# Patient Record
Sex: Female | Born: 1935 | Race: White | Hispanic: No | Smoking: Former smoker
Health system: Southern US, Community
[De-identification: ages and names within clinical notes are randomized; demographics above are authoritative.]

## PROBLEM LIST (undated history)

## (undated) DIAGNOSIS — R300 Dysuria: Secondary | ICD-10-CM

## (undated) DIAGNOSIS — E785 Hyperlipidemia, unspecified: Secondary | ICD-10-CM

## (undated) DIAGNOSIS — J40 Bronchitis, not specified as acute or chronic: Secondary | ICD-10-CM

## (undated) DIAGNOSIS — I8310 Varicose veins of unspecified lower extremity with inflammation: Secondary | ICD-10-CM

## (undated) DIAGNOSIS — K259 Gastric ulcer, unspecified as acute or chronic, without hemorrhage or perforation: Secondary | ICD-10-CM

## (undated) DIAGNOSIS — G4733 Obstructive sleep apnea (adult) (pediatric): Secondary | ICD-10-CM

## (undated) DIAGNOSIS — E039 Hypothyroidism, unspecified: Secondary | ICD-10-CM

## (undated) DIAGNOSIS — M1990 Unspecified osteoarthritis, unspecified site: Secondary | ICD-10-CM

## (undated) DIAGNOSIS — F32A Depression, unspecified: Secondary | ICD-10-CM

## (undated) DIAGNOSIS — H9190 Unspecified hearing loss, unspecified ear: Secondary | ICD-10-CM

## (undated) DIAGNOSIS — M25511 Pain in right shoulder: Secondary | ICD-10-CM

## (undated) DIAGNOSIS — I639 Cerebral infarction, unspecified: Secondary | ICD-10-CM

## (undated) DIAGNOSIS — R0600 Dyspnea, unspecified: Secondary | ICD-10-CM

## (undated) DIAGNOSIS — Z90710 Acquired absence of both cervix and uterus: Secondary | ICD-10-CM

## (undated) DIAGNOSIS — M545 Low back pain, unspecified: Secondary | ICD-10-CM

## (undated) DIAGNOSIS — I8390 Asymptomatic varicose veins of unspecified lower extremity: Secondary | ICD-10-CM

## (undated) DIAGNOSIS — R011 Cardiac murmur, unspecified: Secondary | ICD-10-CM

## (undated) DIAGNOSIS — Z9049 Acquired absence of other specified parts of digestive tract: Secondary | ICD-10-CM

## (undated) DIAGNOSIS — R350 Frequency of micturition: Secondary | ICD-10-CM

## (undated) DIAGNOSIS — Z9289 Personal history of other medical treatment: Secondary | ICD-10-CM

## (undated) DIAGNOSIS — M546 Pain in thoracic spine: Secondary | ICD-10-CM

## (undated) DIAGNOSIS — Z87440 Personal history of urinary (tract) infections: Secondary | ICD-10-CM

## (undated) DIAGNOSIS — M722 Plantar fascial fibromatosis: Secondary | ICD-10-CM

## (undated) DIAGNOSIS — H269 Unspecified cataract: Secondary | ICD-10-CM

## (undated) DIAGNOSIS — M79606 Pain in leg, unspecified: Secondary | ICD-10-CM

## (undated) DIAGNOSIS — R079 Chest pain, unspecified: Secondary | ICD-10-CM

## (undated) DIAGNOSIS — T7840XA Allergy, unspecified, initial encounter: Secondary | ICD-10-CM

## (undated) DIAGNOSIS — R197 Diarrhea, unspecified: Secondary | ICD-10-CM

## (undated) DIAGNOSIS — F329 Major depressive disorder, single episode, unspecified: Secondary | ICD-10-CM

## (undated) DIAGNOSIS — I10 Essential (primary) hypertension: Secondary | ICD-10-CM

## (undated) DIAGNOSIS — R002 Palpitations: Secondary | ICD-10-CM

## (undated) DIAGNOSIS — G459 Transient cerebral ischemic attack, unspecified: Secondary | ICD-10-CM

## (undated) DIAGNOSIS — M25562 Pain in left knee: Secondary | ICD-10-CM

## (undated) DIAGNOSIS — Z90722 Acquired absence of ovaries, bilateral: Secondary | ICD-10-CM

## (undated) DIAGNOSIS — R269 Unspecified abnormalities of gait and mobility: Principal | ICD-10-CM

## (undated) DIAGNOSIS — E7439 Other disorders of intestinal carbohydrate absorption: Secondary | ICD-10-CM

## (undated) DIAGNOSIS — Z9889 Other specified postprocedural states: Secondary | ICD-10-CM

## (undated) DIAGNOSIS — K649 Unspecified hemorrhoids: Secondary | ICD-10-CM

## (undated) DIAGNOSIS — G4730 Sleep apnea, unspecified: Secondary | ICD-10-CM

## (undated) DIAGNOSIS — H8110 Benign paroxysmal vertigo, unspecified ear: Secondary | ICD-10-CM

## (undated) DIAGNOSIS — R001 Bradycardia, unspecified: Secondary | ICD-10-CM

## (undated) DIAGNOSIS — E663 Overweight: Secondary | ICD-10-CM

## (undated) DIAGNOSIS — I350 Nonrheumatic aortic (valve) stenosis: Secondary | ICD-10-CM

## (undated) DIAGNOSIS — Z9989 Dependence on other enabling machines and devices: Secondary | ICD-10-CM

## (undated) DIAGNOSIS — N3020 Other chronic cystitis without hematuria: Secondary | ICD-10-CM

## (undated) DIAGNOSIS — Z9079 Acquired absence of other genital organ(s): Secondary | ICD-10-CM

## (undated) HISTORY — DX: Personal history of other medical treatment: Z92.89

## (undated) HISTORY — DX: Pain in left knee: M25.562

## (undated) HISTORY — DX: Dysuria: R30.0

## (undated) HISTORY — DX: Acquired absence of other specified parts of digestive tract: Z90.49

## (undated) HISTORY — DX: Gastric ulcer, unspecified as acute or chronic, without hemorrhage or perforation: K25.9

## (undated) HISTORY — PX: TONSILLECTOMY: SUR1361

## (undated) HISTORY — DX: Bronchitis, not specified as acute or chronic: J40

## (undated) HISTORY — DX: Nonrheumatic aortic (valve) stenosis: I35.0

## (undated) HISTORY — DX: Pain in thoracic spine: M54.6

## (undated) HISTORY — DX: Cardiac murmur, unspecified: R01.1

## (undated) HISTORY — DX: Bradycardia, unspecified: R00.1

## (undated) HISTORY — DX: Pain in right shoulder: M25.511

## (undated) HISTORY — DX: Depression, unspecified: F32.A

## (undated) HISTORY — DX: Allergy, unspecified, initial encounter: T78.40XA

## (undated) HISTORY — DX: Unspecified abnormalities of gait and mobility: R26.9

## (undated) HISTORY — DX: Dependence on other enabling machines and devices: Z99.89

## (undated) HISTORY — DX: Palpitations: R00.2

## (undated) HISTORY — DX: Plantar fascial fibromatosis: M72.2

## (undated) HISTORY — DX: Diarrhea, unspecified: R19.7

## (undated) HISTORY — DX: Dyspnea, unspecified: R06.00

## (undated) HISTORY — DX: Other chronic cystitis without hematuria: N30.20

## (undated) HISTORY — DX: Asymptomatic varicose veins of unspecified lower extremity: I83.90

## (undated) HISTORY — DX: Acquired absence of both cervix and uterus: Z90.722

## (undated) HISTORY — DX: Benign paroxysmal vertigo, unspecified ear: H81.10

## (undated) HISTORY — DX: Acquired absence of both cervix and uterus: Z90.79

## (undated) HISTORY — DX: Major depressive disorder, single episode, unspecified: F32.9

## (undated) HISTORY — DX: Unspecified osteoarthritis, unspecified site: M19.90

## (undated) HISTORY — DX: Unspecified hemorrhoids: K64.9

## (undated) HISTORY — PX: INCONTINENCE SURGERY: SHX676

## (undated) HISTORY — DX: Chest pain, unspecified: R07.9

## (undated) HISTORY — DX: Acquired absence of both cervix and uterus: Z90.710

## (undated) HISTORY — DX: Personal history of urinary (tract) infections: Z87.440

## (undated) HISTORY — DX: Low back pain, unspecified: M54.50

## (undated) HISTORY — DX: Overweight: E66.3

## (undated) HISTORY — DX: Other disorders of intestinal carbohydrate absorption: E74.39

## (undated) HISTORY — DX: Varicose veins of unspecified lower extremity with inflammation: I83.10

## (undated) HISTORY — DX: Obstructive sleep apnea (adult) (pediatric): G47.33

## (undated) HISTORY — DX: Frequency of micturition: R35.0

## (undated) HISTORY — DX: Unspecified hearing loss, unspecified ear: H91.90

## (undated) HISTORY — DX: Other specified postprocedural states: Z98.890

## (undated) HISTORY — PX: ABDOMINAL HYSTERECTOMY: SHX81

## (undated) HISTORY — DX: Pain in leg, unspecified: M79.606

## (undated) HISTORY — PX: CHOLECYSTECTOMY: SHX55

---
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
## 1998-06-02 ENCOUNTER — Ambulatory Visit (HOSPITAL_COMMUNITY): Admission: RE | Admit: 1998-06-02 | Discharge: 1998-06-02 | Payer: Self-pay | Admitting: Internal Medicine

## 1998-06-18 ENCOUNTER — Other Ambulatory Visit: Admission: RE | Admit: 1998-06-18 | Discharge: 1998-06-18 | Payer: Self-pay | Admitting: Obstetrics and Gynecology

## 1998-09-03 ENCOUNTER — Other Ambulatory Visit: Admission: RE | Admit: 1998-09-03 | Discharge: 1998-09-03 | Payer: Self-pay | Admitting: Podiatry

## 1999-11-25 ENCOUNTER — Other Ambulatory Visit: Admission: RE | Admit: 1999-11-25 | Discharge: 1999-11-25 | Payer: Self-pay | Admitting: Obstetrics and Gynecology

## 2001-03-07 ENCOUNTER — Other Ambulatory Visit: Admission: RE | Admit: 2001-03-07 | Discharge: 2001-03-07 | Payer: Self-pay | Admitting: Obstetrics and Gynecology

## 2002-04-03 ENCOUNTER — Other Ambulatory Visit: Admission: RE | Admit: 2002-04-03 | Discharge: 2002-04-03 | Payer: Self-pay | Admitting: Gynecology

## 2002-10-19 ENCOUNTER — Encounter (INDEPENDENT_AMBULATORY_CARE_PROVIDER_SITE_OTHER): Payer: Self-pay | Admitting: *Deleted

## 2002-10-19 ENCOUNTER — Ambulatory Visit (HOSPITAL_BASED_OUTPATIENT_CLINIC_OR_DEPARTMENT_OTHER): Admission: RE | Admit: 2002-10-19 | Discharge: 2002-10-19 | Payer: Self-pay | Admitting: Urology

## 2003-07-12 ENCOUNTER — Ambulatory Visit (HOSPITAL_COMMUNITY): Admission: RE | Admit: 2003-07-12 | Discharge: 2003-07-12 | Payer: Self-pay | Admitting: Gastroenterology

## 2003-10-08 ENCOUNTER — Inpatient Hospital Stay (HOSPITAL_COMMUNITY): Admission: RE | Admit: 2003-10-08 | Discharge: 2003-10-10 | Payer: Self-pay | Admitting: Gynecology

## 2004-07-10 ENCOUNTER — Inpatient Hospital Stay (HOSPITAL_COMMUNITY): Admission: EM | Admit: 2004-07-10 | Discharge: 2004-07-11 | Payer: Self-pay | Admitting: Emergency Medicine

## 2004-07-11 ENCOUNTER — Encounter: Payer: Self-pay | Admitting: *Deleted

## 2004-07-20 ENCOUNTER — Other Ambulatory Visit: Admission: RE | Admit: 2004-07-20 | Discharge: 2004-07-20 | Payer: Self-pay | Admitting: Gynecology

## 2004-12-31 ENCOUNTER — Ambulatory Visit: Payer: Self-pay | Admitting: Licensed Clinical Social Worker

## 2005-01-15 ENCOUNTER — Ambulatory Visit: Payer: Self-pay | Admitting: Licensed Clinical Social Worker

## 2005-02-26 ENCOUNTER — Ambulatory Visit: Payer: Self-pay | Admitting: Licensed Clinical Social Worker

## 2005-05-21 ENCOUNTER — Ambulatory Visit: Payer: Self-pay | Admitting: Licensed Clinical Social Worker

## 2005-05-27 ENCOUNTER — Ambulatory Visit: Payer: Self-pay | Admitting: Licensed Clinical Social Worker

## 2005-06-02 ENCOUNTER — Ambulatory Visit: Payer: Self-pay | Admitting: Licensed Clinical Social Worker

## 2005-06-11 ENCOUNTER — Ambulatory Visit: Payer: Self-pay | Admitting: Licensed Clinical Social Worker

## 2005-06-15 ENCOUNTER — Ambulatory Visit: Payer: Self-pay | Admitting: Licensed Clinical Social Worker

## 2005-06-18 ENCOUNTER — Ambulatory Visit: Payer: Self-pay | Admitting: Licensed Clinical Social Worker

## 2005-06-23 ENCOUNTER — Ambulatory Visit: Payer: Self-pay | Admitting: Licensed Clinical Social Worker

## 2005-06-25 ENCOUNTER — Ambulatory Visit: Payer: Self-pay | Admitting: Licensed Clinical Social Worker

## 2005-08-03 ENCOUNTER — Ambulatory Visit: Payer: Self-pay | Admitting: Licensed Clinical Social Worker

## 2005-10-13 ENCOUNTER — Ambulatory Visit: Payer: Self-pay | Admitting: Licensed Clinical Social Worker

## 2005-11-24 ENCOUNTER — Ambulatory Visit: Payer: Self-pay | Admitting: Licensed Clinical Social Worker

## 2006-08-31 ENCOUNTER — Ambulatory Visit: Payer: Self-pay | Admitting: Licensed Clinical Social Worker

## 2006-09-12 ENCOUNTER — Other Ambulatory Visit: Admission: RE | Admit: 2006-09-12 | Discharge: 2006-09-12 | Payer: Self-pay | Admitting: Gynecology

## 2006-09-14 ENCOUNTER — Ambulatory Visit: Payer: Self-pay | Admitting: Licensed Clinical Social Worker

## 2007-10-18 ENCOUNTER — Encounter: Admission: RE | Admit: 2007-10-18 | Discharge: 2007-10-18 | Payer: Self-pay | Admitting: Gynecology

## 2007-10-30 ENCOUNTER — Ambulatory Visit: Payer: Self-pay | Admitting: Licensed Clinical Social Worker

## 2008-10-30 ENCOUNTER — Encounter: Admission: RE | Admit: 2008-10-30 | Discharge: 2008-10-30 | Payer: Self-pay | Admitting: Internal Medicine

## 2009-03-06 ENCOUNTER — Encounter: Admission: RE | Admit: 2009-03-06 | Discharge: 2009-03-06 | Payer: Self-pay | Admitting: Internal Medicine

## 2009-11-17 ENCOUNTER — Encounter: Admission: RE | Admit: 2009-11-17 | Discharge: 2009-11-17 | Payer: Self-pay | Admitting: Internal Medicine

## 2010-07-23 ENCOUNTER — Ambulatory Visit: Payer: Self-pay | Admitting: Cardiology

## 2010-08-14 ENCOUNTER — Ambulatory Visit (INDEPENDENT_AMBULATORY_CARE_PROVIDER_SITE_OTHER): Payer: Medicare Other | Admitting: Cardiology

## 2010-08-14 ENCOUNTER — Encounter: Payer: Self-pay | Admitting: Cardiology

## 2010-08-14 DIAGNOSIS — R079 Chest pain, unspecified: Secondary | ICD-10-CM

## 2010-08-14 DIAGNOSIS — R011 Cardiac murmur, unspecified: Secondary | ICD-10-CM | POA: Insufficient documentation

## 2010-08-18 ENCOUNTER — Telehealth (INDEPENDENT_AMBULATORY_CARE_PROVIDER_SITE_OTHER): Payer: Self-pay | Admitting: *Deleted

## 2010-08-19 ENCOUNTER — Ambulatory Visit (HOSPITAL_COMMUNITY): Payer: Medicare Other | Attending: Cardiology

## 2010-08-19 ENCOUNTER — Ambulatory Visit (HOSPITAL_BASED_OUTPATIENT_CLINIC_OR_DEPARTMENT_OTHER): Payer: Medicare Other

## 2010-08-19 ENCOUNTER — Encounter: Payer: Self-pay | Admitting: Cardiology

## 2010-08-19 DIAGNOSIS — R011 Cardiac murmur, unspecified: Secondary | ICD-10-CM

## 2010-08-19 DIAGNOSIS — G4730 Sleep apnea, unspecified: Secondary | ICD-10-CM | POA: Insufficient documentation

## 2010-08-19 DIAGNOSIS — E785 Hyperlipidemia, unspecified: Secondary | ICD-10-CM | POA: Insufficient documentation

## 2010-08-19 DIAGNOSIS — R072 Precordial pain: Secondary | ICD-10-CM

## 2010-08-19 DIAGNOSIS — I10 Essential (primary) hypertension: Secondary | ICD-10-CM | POA: Insufficient documentation

## 2010-08-20 ENCOUNTER — Telehealth: Payer: Self-pay | Admitting: Cardiology

## 2010-08-25 NOTE — Progress Notes (Signed)
Summary: pt returning your call  Phone Note Call from Patient Call back at Home Phone [phone]   Caller: Patient Reason for Call: Talk to Nurse, Lab or Test Results Summary of Call: returning your call Initial call taken by: Roe Coombs,  August 20, 2010 1:15 PM  Follow-up for Phone Call        I talked with pt about recent echo and stress echo  results

## 2010-08-25 NOTE — Assessment & Plan Note (Signed)
Summary: np6. chest pain. pt has medicare. bcbs.gd, per [place] office ...   Visit Type:  Initial Consult Primary Provider:  Dr. Jacky Kindle   History of Present Illness: 77 yo with history of HTN and hyperlipidemia presents for evaluation of chest pain.  Last month, patient had about 3 episodes of central chest heaviness.  These episodes lasted for several hours at a time and had no particular trigger.  They were not related to meals and not associated with exertion in any way.  The discomfort was mild to moderate.  She swims and does yoga on most days with no exertional dyspnea or chest discomfort.  She has HTN that has been under control with her current medical regimen.  She quit smoking in the 1980s.  She has a history of hyperlipidemia and was on a statin in the past but stopped it due to side effects.  She is now on red yeast rice extract.    Patient had a stress test with Dr. Reyes Ivan about 5 years ago that per her report was normal.   ECG: NSR, rate 51, somewhat peaked T waves  Current Medications (verified): 1)  Lexapro 10 Mg Tabs (Escitalopram Oxalate) .... Once Daily 2)  Lozol 2.5mg  .... Once Daily 3)  Urex 1 Gm Tabs (Methenamine Hippurate) .... Two Times A Day 4)  Plendil 5mg  .... Take One Tablet By Mouth Daily 5)  Synthroid 50 Mcg Tabs (Levothyroxine Sodium) .... Once Daily 6)  Red Yeast Rice   Powd (Red Yeast Rice Extract) .... Two Times A Day 7)  Fish Oil   Oil (Fish Oil) .... 4 Capsules Daily 8)  Cinnamon Oil  Oil (Cassia Oil) .... Two Times A Day 9)  Vitamin D 1000 Unit Tabs (Cholecalciferol) .... Once Daily 10)  Aspirin 81 Mg Tbec (Aspirin) .... Take One Tablet By Mouth Daily 11)  B Complex Vitamins  Caps (B Complex Vitamins) .... Once Daily 12)  Biotin 1000 Mcg Tabs (Biotin) .... Once Daily  Allergies: 1)  ! Cipro 2)  ! * Ephedrine 3)  ! Macrobid 4)  ! Naprosyn 5)  ! Sulfa 6)  ! Lodine  Past History:  Past Medical History: 1. Hypertension 2. Hyperlipidemia 3.  Hypothyroidism 4. Depression 5. chronic cystitis 6. Plantar fasciitis 7. h/o cholecystectomy 8. TAH/BSO  Family History: Mother with MI in her 97s, uncle with MI in his 36s, another uncle with MI in his 79s  Social History: The patient is a retired Engineer, site She lives with her husband.  2 children.  She neither smokes nor drinks (quit tobacco about 1980).   Vital Signs:  Patient profile:   77 year old female Height:      65 inches Weight:      179 pounds BMI:     29.89 Pulse rate:   51 / minute BP sitting:   142 / 66  (left arm)  Vitals Entered By: Laurance Flatten CMA (August 14, 2010 10:04 AM)  Physical Exam  General:  Well developed, well nourished, in no acute distress. Head:  normocephalic and atraumatic Nose:  no deformity, discharge, inflammation, or lesions Mouth:  Teeth, gums and palate normal. Oral mucosa normal. Neck:  Neck supple, no JVD. No masses, thyromegaly or abnormal cervical nodes. Lungs:  Clear bilaterally to auscultation and percussion. Heart:  Non-displaced PMI, chest non-tender; regular rate and rhythm, S1, S2 without rubs or gallops. 2/6 early systolic murmur RUSB.  Carotid upstroke normal, no bruit. Pedals normal pulses. Trace ankle edema.  Abdomen:  Bowel sounds positive; abdomen soft and non-tender without masses, organomegaly, or hernias noted. No hepatosplenomegaly. Extremities:  No clubbing or cyanosis. Neurologic:  Alert and oriented x 3. Skin:  Intact without lesions or rashes. Psych:  Normal affect.   Impression & Recommendations:  Problem # 1:  CHEST PAIN (ICD-786.50) Atypical chest pain in a woman with some cardiac risk factors (HTN, hyperlipidemia).  I will plan on getting a stress echo to risk stratify.  I will check her lipids and have her start ASA 81 mg daily.   Problem # 2:  MURMUR (ICD-785.2) Right upper sternal border systolic murmur suggests aortic sclerosis versus mild aortic stenosis.  Will evaluate with echo.   Other  Orders: Echocardiogram (Echo) Stress Echo (Stress Echo)  Patient Instructions: 1)  Your physician has recommended you make the following change in your medication:  2)  Take Aspirin 81mg  daily--this should be enteric coated. 3)  Your physician has requested that you have a stress echocardiogram. For further information please visit [email or web address]  Please follow instruction sheet as given. 4)  Your physician has requested that you have an echocardiogram.   5)  Echocardiography is a painless test that uses sound waves to create images of your heart. It provides your doctor with information about the size and shape of your heart and how well your heart's chambers and valves are working.  This procedure takes approximately one hour. There are no restrictions for this procedure. 6)  Your physician recommends that you return for a FASTING lipid profile when you return for testing. 7)    8)  Your physician recommends that you schedule a follow-up appointment as needed with Dr Shirlee Latch.

## 2010-08-25 NOTE — Progress Notes (Signed)
Summary: stress echo  Phone Note Outgoing Call Call back at [facility] Phone [phone]   Call placed by: Stanton Kidney, EMT-P,  August 18, 2010 3:20 PM Action Taken: Phone Call Completed Summary of Call: Left message ref: stress echo. Stanton Kidney, EMT-P  August 18, 2010 3:21 PM

## 2010-08-26 ENCOUNTER — Other Ambulatory Visit: Payer: Self-pay | Admitting: Cardiology

## 2010-08-26 ENCOUNTER — Other Ambulatory Visit (INDEPENDENT_AMBULATORY_CARE_PROVIDER_SITE_OTHER): Payer: Medicare Other

## 2010-08-26 ENCOUNTER — Encounter: Payer: Self-pay | Admitting: Cardiology

## 2010-08-26 DIAGNOSIS — R079 Chest pain, unspecified: Secondary | ICD-10-CM

## 2010-08-26 DIAGNOSIS — E785 Hyperlipidemia, unspecified: Secondary | ICD-10-CM

## 2010-08-26 LAB — LIPID PANEL
Cholesterol: 215 mg/dL — ABNORMAL HIGH (ref 0–200)
HDL: 43.8 mg/dL (ref >39.00)
Total CHOL/HDL Ratio: 5
Triglycerides: 189 mg/dL — ABNORMAL HIGH (ref 0.0–149.0)
VLDL: 37.8 mg/dL (ref 0.0–40.0)

## 2010-08-26 LAB — LDL CHOLESTEROL, DIRECT: Direct LDL: 135.1 mg/dL

## 2010-09-01 ENCOUNTER — Telehealth: Payer: Self-pay | Admitting: Cardiology

## 2010-09-01 NOTE — Telephone Encounter (Signed)
Pt given recent lipid results

## 2010-10-19 ENCOUNTER — Other Ambulatory Visit: Payer: Self-pay | Admitting: Internal Medicine

## 2010-10-19 DIAGNOSIS — Z1231 Encounter for screening mammogram for malignant neoplasm of breast: Secondary | ICD-10-CM

## 2010-10-30 NOTE — Op Note (Signed)
NAME:  Autumn Allen, Autumn Allen                      ACCOUNT NO.:  [id number]   MEDICAL RECORD NO.:  [id number]                   PATIENT TYPE:  AMB   LOCATION:  NESC                                 FACILITY:  [facility]   PHYSICIAN:  Jamison Neighbor, M.D.               DATE OF BIRTH:  1929/12/16   DATE OF PROCEDURE:  10/19/2002  DATE OF DISCHARGE:                                 OPERATIVE REPORT   PREOPERATIVE DIAGNOSIS:  Chronic pelvic pain, rule out interstitial  cystitis.   POSTOPERATIVE DIAGNOSIS:  Chronic pelvic pain, rule out interstitial  cystitis.   PROCEDURE:  1. Cystoscopy.  2. Urethral calibration.  3. Hydrodistention of bladder.  4. Bladder biopsy.  5. Marcaine and Pyridium instillation.  6. Marcaine and Kenalog bilateral pudendal nerve block.   SURGEON:  Jamison Neighbor, M.D.   ANESTHESIA:  General.   COMPLICATIONS:  None.   DRAINS:  None.   BRIEF HISTORY:  This 77 year old female initially presented with probably  urinary tract symptoms, thought to be due to a chronic urethritis.  She was  treated with antibiotics and the patient initially did well.  Even after  normalization of her urine, the patient still had lower urinary tract  symptoms.  She has continued on multiple antibiotic therapy, but because she  is still having persistent problems, she has requested evaluation for  interstitial cystitis.  The patient has used some estrogen cream, which has  been helpful.  But, she still wishes to undergo cystoscopic examination.  She is convinced that there is something wrong with her bladder.  The  patient understands the risks and benefits of the procedure, and gave full  and informed consent.   DESCRIPTION OF PROCEDURE:  After the successful induction of general  anesthesia, the patient was placed in the dorsal lithotomy position; prepped  with Betadine and draped in the usual sterile fashion.  On pelvic  examination the patient was noted to have some degree of  atrophic vaginitis  with mucosal thinning, and was felt that she would certainly respond nicely  to estrogen cream.  The patient has a moderate cystocele, no urethrocele, no  rectocele; but, on posterior palpation is noted to have extensive  inspissated stool sitting in the vault.  Vaginal pressure is used to expel  all of the this so that a B&O suppository could be inserted.  This was  inserted,the area was reprepped and gloves were changed.  The urethra was  dilated at 32-French; ___________, but no interstitial stenosis or  stricture.   The cystoscope was inserted.  The bladder was inspected with both 12-degree  and 7-degree lenses.  No tumors or stones could be seen.  There was little  in the way of squamous metaplasia.  The ureteral orifices were normal.  The  bladder mucosa was unremarkable in its appearance.  The bladder was  distended at a pressure of 170 mm water for five minutes.  When the bladder  was drained, the patient was noted to have fairly marked glomerulations  throughout the bladder, with a significant terminal blood tinge.  This would  certainly be consistent with interstitial cystitis, but the patient did have  a normal bladder capacity of approximately 1300 cc under anesthesia.   The patient's bladder was drained,  A biopsy was performed.  The biopsy site  was cauterized.  This biopsy was sent for mast cell analysis.  The bladder  was drained and a mixture of Marcaine and Pyridium was left in the bladder.  Marcaine and Kenalog were injected bilaterally as pudendal nerve blocks.  The patient received intraoperative Toradol and Zofran, as well as  preoperative antibiotics.  The patient also received the intraoperative B&O  suppository.   DISPOSITION:  She will be sent home with a prescription for Lorcet-Plus,  Pyridium-Plus; as well as a short course of antibiotics.  She will be  encouraged to continue with estrogen cream on a daily basis.   FOLLOW UP:  She will  return to my office in follow up in three weeks time.  The patient and I will review the biopsy report at that time.  Certainly we  can consider interstitial cystitis-directed therapy, if the patient had a  response to the hydrodistention and/or the biopsy shows significant mast  cells.  If, on the other hand, the biopsy is unremarkable and the patient  did not respond to hydrodistention, we will have to consider simple  symptomatic management.  This could also include the estrogen cream and the  management of her lower urinary tract symptoms.                                               Jamison Neighbor, M.D.    RJE/MEDQ  D:  10/19/2002  T:  10/19/2002  Job:  [id number]   cc:   Gretta Cool, M.D.  [address] [place]  [place] [address]  Fax: [phone]   Geoffry Paradise, M.D.  [address]  [place]  [place] [address]  Fax: [phone]

## 2010-10-30 NOTE — Discharge Summary (Signed)
NAME:  Autumn Allen, Autumn Allen NO.:  [id number]   MEDICAL RECORD NO.:  [id number]                   PATIENT TYPE:  INP   LOCATION:  0450                                 FACILITY:  [facility]   PHYSICIAN:  Gretta Cool, M.D.              DATE OF BIRTH:  02-24-1930   DATE OF ADMISSION:  10/08/2003  DATE OF DISCHARGE:  10/10/2003                                 DISCHARGE SUMMARY   HISTORY OF PRESENT ILLNESS:  Ms. Hickey is a 77 year old female gravida  5, para 3, AB 2 with a history of hysterectomy since 1983.  Over the last  few years she has had worsening pelvic organ prolapse now grade 4 cystocele,  grade 3 rectocele and enterocele, with vaginal vault descensus.  She has had  testing, barrier test, with pessary placement which increases her leakage  greatly.  She has been followed by Dr. Eudelia Bunch for incontinence.  There is  some urge component but the incontinence is dramatically increased with  placement of the bladder to the usual anatomic location.  She is now  admitted for definitive therapy of the global pelvic organ prolapse by  cystocele, rectocele and enterocele repairs by Dr. Nicholas Lose and by pubovaginal  sling procedure by Dr. Logan Bores.   ADMISSION EXAMINATION:  CHEST:  Chest clear to A&P.  HEART:  Heart rate and rhythm are regular without murmur, gallop or cardiac  enlargement.  ABDOMEN:  Soft and scaphoid without masses or organomegaly.  PELVIC:  External genitalia within normal limits for female, widening of the  genital hiatus.  There is a grade 4 cystocele, a grade 3 rectocele and an  enterocele.  She has vaginal vault descensus post hysterectomy.  There is an  enormous central fascial defect with extension all the way to the apex of  the vaginal cuff.  She has detachment of the posterior perirectal fascia  from the apex to the cuff with a large bulge of enterocele and rectocele.  The apex of the vaginal cuff extends significantly.  Rectovaginal  exam  confirms.   IMPRESSION:  1. Global pelvic organ prolapse with cystocele, rectocele, enterocele grade     3-4 and vaginal vault prolapse.  2. Mixed incontinence.  3. Cystocele, rectocele and enterocele repairs with colposuspension by Dr.     Nicholas Lose and Dr. Eudelia Bunch to do the pubovaginal sling portion of the     procedure.  Risks and benefits were discussed with the patient and she     accepts these procedures.   LABORATORY DATA:  Admission hemoglobin 15.8, hematocrit 46.  On the first  postoperative day hemoglobin was 13.3, hematocrit 38.4, the remainder of her  preoperative lab work was within normal limits.  EKG showed sinus  bradycardia otherwise normal.  Chest x-ray no active disease.   HOSPITAL COURSE:  Patient underwent cystocele, rectocele, enterocele  repairs, cardinal uterosacral suspension by Dr. Nicholas Lose and pubovaginal sling  by Dr.  Eudelia Bunch under general anesthesia.  The procedures were completed  without any complications and the patient was returned to the recovery room  in excellent condition.  Her postoperative course was without complications  and she was discharged on the second postoperative day in excellent  condition.  On discharge she was voiding adequate volumes with some urgency.  It appears that she was discharged with the suprapubic catheter intact and  was to continue voiding trials.  She is to report any fever of over 100.5 or  failure of daily improvement, diet regular, medications Tylox one p.o. q.4h.  p.r.n. discomfort, stool softener as needed, she is also to avoid any  vaginal entrance or heavy lifting or straining.   CONDITION ON DISCHARGE:  Excellent.   FINAL DISCHARGE DIAGNOSES:  1. 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  2. Urinary incontinence.   PROCEDURES PERFORMED:  1. Cystocele, rectocele and enterocele repairs.  2. Cardinal uterosacral colposuspension by Dr.  Nicholas Lose.  3. Pubovaginal sling by Dr. Marcelyn Bruins.     Matt Holmes, N.P.                          Gretta Cool, M.D.    EMK/MEDQ  D:  11/18/2003  T:  11/18/2003  Job:  [id number]   cc:   Geoffry Paradise, M.D.  [address]  [place]  [place] [address]  Fax: [phone]   Jamison Neighbor, M.D.  [address]  [place]  [place] [address]  Fax: [phone]

## 2010-10-30 NOTE — Discharge Summary (Signed)
Autumn Allen, FILION NO.:  [id number]   MEDICAL RECORD NO.:  [id number]          PATIENT TYPE:  INP   LOCATION:  0370                         FACILITY:  [facility]   PHYSICIAN:  Elmore Guise., M.D.DATE OF BIRTH:  [birth year]   DATE OF ADMISSION:  07/09/2004  DATE OF DISCHARGE:  07/11/2004                                 DISCHARGE SUMMARY   DISCHARGE DIAGNOSES:  1.  Hypertension.  2.  History of hypothyroidism.  3.  Chest pain most likely from gastrointestinal etiology.   HISTORY OF PRESENT ILLNESS:  Patient is a very pleasant, 77 year old, white  female who presented to [facility] complaining of one-week history of  anterior chest pain.  Patient noted she recently started a new dietary  supplement, was having increased gas, however, some exertional component to  her chest pain.  She was admitted for observation and cardiac evaluation.   HOSPITAL COURSE:  The patient's hospital course was uncomplicated.  She had  serial cardiac enzymes performed which were negative.  She underwent stress  Cardiolite on 07/11/04 exercising via Bruce protocol for 6 minutes, achieving  peak heart rate of 141 beats per minute, corresponding to 95% of age-  predicted maximum.  Patient had no significant ECG changes and stress test  was negative per ECG criteria.  Nuclear images showed normal perfusion at  stress and rest.  She had no t.i.d. or high risk markers.  She had an EF of  73% with no significant wall motion abnormalities.  Patient was started on  Protonix during her hospitalization with no significant return in her chest  pain.  On day of admission, patient was given sublingual nitroglycerin with  profound drop in her blood pressure to 80/40 which responded to IV fluid  without any significant difficulty.  She will be discharged home to continue  her Protonix and follow up with Dr. Geoffry Paradise in 4 weeks.  She will  follow up with cardiology as needed.   DISCHARGE  MEDICATIONS:  1.  Plendil 5 mg daily (as before).  2.  Indapamide 2.5 mg daily (as before).  3.  Synthroid 50 mcg daily (as before).  4.  Aspirin 81 mg daily (as before).  5.  New medication is Protonix 40 mg p.o. daily.   PAIN MANAGEMENT:  None.   ACTIVITY:  As tolerated.   DIET:  Low cholesterol, low salt diet.   FOLLOWUP:  Dr. Jacky Kindle at [facility] in one month.  Otherwise, she will  follow up with Dr. Reyes Ivan on a p.r.n. basis.   DISCHARGE INSTRUCTIONS:  I did ask her to notify me should she have any  further problems with increase in exertional chest pain and telephone number  for the office was given.      TWK/MEDQ  D:  07/11/2004  T:  07/11/2004  Job:  [id number]   cc:   Geoffry Paradise, M.D.  [address]  [place]  [place] [address]  Fax: [phone]

## 2010-10-30 NOTE — Op Note (Signed)
NAME:  Autumn Allen, Autumn Allen                      ACCOUNT NO.:  [id number]   MEDICAL RECORD NO.:  [id number]                   PATIENT TYPE:  INP   LOCATION:  0450                                 FACILITY:  [facility]   PHYSICIAN:  Gretta Cool, M.D.              DATE OF BIRTH:  1929-06-16   DATE OF PROCEDURE:  DATE OF DISCHARGE:                                 OPERATIVE REPORT   PREOPERATIVE DIAGNOSES:  1. 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  2. Urinary incontinence.   POSTOPERATIVE DIAGNOSES:  1. 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  2. Urinary incontinence.   SURGEON:  Gretta Cool, M.D.   ASSISTANTS:  Phyllis Ginger, M.D. for gyn portion of the procedure.  Brennan Bailey, M.D. for the urology portion of the procedure.   PROCEDURES:  1. Cystocele.  2. Rectocele.  3. Enterocele repairs.  4. Cardinal uterosacral colposuspension by Dr. Nicholas Lose.  5. Pubovaginal sling by Dr. Brennan Bailey.   DESCRIPTION OF PROCEDURE:  Under excellent general anesthesia with the  patient prepped and draped in lithotomy position, the vaginal apex was  grasped was Allis clamps and a transverse incision made.  The mucosa was  then incised and undermined until the base of the urethra was reached.  At  this point, the mucosa was reflected from the endopelvic fascia so as to  expose the extent of the central fascial defect approximately 4 cm across.  An enormous hernia of bladder wall was encountered and the fascia plicated  with first a pursestring suture of 2-0 Vicryl.  Then, a series of vertical  mattress sutures were placed so as to plicate the fascia in the midline and  to completely obliterate the enormous fascial defect.  The repair was  carried all the way to the base of the bladder, and the bladder pillars were  plicated also to each other in the  midline.  The base of the bladder and the  bladder pillars were then secured to the vaginal cuff with interrupted  sutures of 0 Vicryl.  Next, the mucosa was trimmed and the upper layers of  endopelvic fascia and mucosa closed as a subcuticular closure by a series of  interrupted vertical mattress sutures.  Once the entire anterior vaginal  wall had been repaired, attention was turned to the posterior vaginal wall  repair.  The mucosa was incised at the introitus and the incision carried to  the apex of the vaginal cuff.  The mucosa was dissected from the perirectal  fascia.  The incision was carried to the apex of the vaginal cuff.  An  enormous detachment was noted with the fascia detached from the apex of the  cuff and totally deficient and devoid of fascia from the apex of the cuff to  halfway down the vagina.  The cardinal uterosacral ligaments were then  secured and sutures placed with 0 Novofil from the uterosacral cardinal  ligaments to the posterior perirectal fascia.  The fascia was then advanced  all the way up to the uterosacral ligament cardinal complex.  Next, a series  of permanent sutures of 0 Novofil were used to secure the fascia to the apex  of the vaginal cuff and to the uterosacral cardinal complex.  At this point,  once the entire fascial defect had been repaired, the levator fascia was  plicated in the midline with a running suture of 0 Vicryl from the apex of  the vagina to the introitus.  At this point, the procedure was interrupted  for Dr. Brennan Bailey pubovaginal sling procedure.  At the completion of this  procedure, the mucosa was trimmed and the mucosa closed from the apex of the  vaginal cuff including the upper layers of endopelvic fascia so as to  completely plicate the perirectal fascia.  Once the upper portion had been  closed, the perineal body muscles were plicated in the midline, the  bulbocavernosus pulled down and attached and the mucosa closed with  a  subcuticular closure of 2-0 Vicryl.  At this point, the [facility]  was placed by Dr. Logan Bores and secured by Dr. Nicholas Lose.  At this point, the  procedure was terminated without complications and the patient returned to  the recovery room in excellent condition.                                               Gretta Cool, M.D.    CWL/MEDQ  D:  10/08/2003  T:  10/08/2003  Job:  [id number]   cc:   Brennan Bailey, MD   Gretta Cool, M.D.  [address] [place]  [place] [address]  Fax: [phone]   Geoffry Paradise, M.D.  [address]  [place]  [place] [address]  Fax: [phone]

## 2010-10-30 NOTE — Op Note (Signed)
NAME:  Autumn Allen, Autumn Allen                      ACCOUNT NO.:  [id number]   MEDICAL RECORD NO.:  [id number]                   PATIENT TYPE:  AMB   LOCATION:  ENDO                                 [facility]ACILITY[facility]  [facility]   PHYSICIAN:  John C. Madilyn Fireman, M.D.                 DATE OF BIRTH:  Nov 05, 1929   DATE OF PROCEDURE:  07/12/2003  DATE OF DISCHARGE:                                 OPERATIVE REPORT   PROCEDURE:  Colonoscopy.   INDICATION FOR PROCEDURE:  Colon cancer screening in a 77 year old patient  with no previous screening.   DESCRIPTION OF PROCEDURE:  The patient was placed in the left lateral  decubitus position and placed on the pulse monitor with continuous low-flow  oxygen delivered by nasal cannula.  She was sedated with 65 mcg IV fentanyl  and 7 mg IV Versed.  The Olympus video colonoscope was inserted into the  rectum and advanced to the cecum, confirmed by transillumination at  McBurney's point and visualization of the ileocecal valve and appendiceal  orifice.  The prep was good.  The cecum, ascending, transverse, descending,  and sigmoid colon all appeared normal with no masses, polyps, diverticula,  or other mucosal abnormalities.  The rectum likewise appeared normal, and  retroflexed view of the anus revealed no obvious internal hemorrhoids.  The  scope was then withdrawn and the patient returned to the recovery room in  stable condition.  She tolerated the procedure well, and there were no  immediate complications.   IMPRESSION:  Normal screening colonoscopy.   PLAN:  Next colon screening by sigmoidoscopy in five years.                                               John C. Madilyn Fireman, M.D.    JCH/MEDQ  D:  07/12/2003  T:  07/12/2003  Job:  [id number]   cc:   Geoffry Paradise, M.D.  [address]  [place]  [place] [address]  Fax: [phone]

## 2010-10-30 NOTE — Op Note (Signed)
NAME:  Autumn Allen, Autumn Allen                      ACCOUNT NO.:  [id number]   MEDICAL RECORD NO.:  [id number]                   PATIENT TYPE:  INP   LOCATION:  0001                                 FACILITY:  [facility]   PHYSICIAN:  Jamison Neighbor, M.D.               DATE OF BIRTH:  09/28/1929   DATE OF PROCEDURE:  10/08/2003  DATE OF DISCHARGE:                                 OPERATIVE REPORT   PREOPERATIVE DIAGNOSIS:  Stress urinary incontinence.   POSTOPERATIVE DIAGNOSIS:  Stress urinary incontinence.   PROCEDURE:  Cystoscopy, cysto cath, and transobturator tape sling.   SURGEON:  Jamison Neighbor, M.D.   ANESTHESIA:  General.   COMPLICATIONS:  None.   DRAINS:  The Bonnano cysto cath.   BRIEF HISTORY:  This 77 year old female has had symptomatic general  prolapse.  She is scheduled to undergo surgical repair by Dr. Beather Arbour  because there was the concern about the possibility of stress incontinence.  The patient will undergo placement of a sling at the same time she undergoes  her cystocele, rectocele, and enterocele repair.  The patient understands  the risks and benefits of the procedure, including the possibility that she  may need to have this sling loosened or tightened, depending on her  postoperative course.  She gave full informed consent.   PROCEDURE:  After the successful induction of general anesthesia, the  patient was placed in the dorsal lithotomy position, prepped with Betadine,  and draped in the usual sterile fashion.  An excellent cystocele, rectocele,  and enterocele repair was performed by Dr. Nicholas Lose.  The patient was left in  position in preparation for the pubovaginal sling.   A weighted vaginal speculum was placed.  The labia had previously been  stitched out to the thigh to improve exposure.  An incision was made in the  anterior vaginal mucosa after the area had been infiltrated with a local  anesthetic, including epinephrine.  A flap of mucosa was  raised bilaterally  and dissection proceeded back to but not through the endopelvic fascia.  The  flap was made just large enough to place a finger in, and this was at the  approximate level of the mid urethra.  Two incisions were made in the crease  of the thigh at the approximate level of the clitoris.  An incision was made  in that area, and the hemostasis was obtained with the electrocautery.  The  C-hook was then passed from the thigh incision through the obturator ring  just underneath the bone, and then passed into the vaginal incision.  This  was then guided out with the operator's finger.  S retractors were used to  expose the vaginal sulcus to make sure that the vagina had not been  inadvertently entered.  The sling was then positioned so that Metzenbaum  scissors could be placed between the mid urethra and the sling, preventing  over-correction.  The  cystoscope was inserted, and the bladder was carefully  inspected with both 12 and 70 degree lenses.  No tumors or stones could be  seen.  Ureteral orifices were normal.  There was no evidence of any injury  to the bladder from the passage of the sling.  Under direct vision, a  Bonnano cysto cath was passed into the bladder.  The bladder was then  filled, and with a Crede maneuver, there was good flow of urine, but with no  Crede maneuver, there was no loss of urine.  This would indicate that the  patient should be able to urinate postoperatively and should not be over-  corrected.  The area was irrigated, and then the mucosa was closed with a  running suture of 2-0 Vicryl.  The patient underwent additional perineal  body repair by Dr. Nicholas Lose.  The patient tolerated the procedure well and was  taken to the recovery room in good condition.                                               Jamison Neighbor, M.D.    RJE/MEDQ  D:  10/08/2003  T:  10/08/2003  Job:  [id number]

## 2010-10-30 NOTE — H&P (Signed)
NAME:  Autumn Allen, Autumn Allen                      ACCOUNT NO.:  [id number]   MEDICAL RECORD NO.:  [id number]                   PATIENT TYPE:  INP   LOCATION:  NA                                   FACILITY:  [facility]   PHYSICIAN:  Gretta Cool, M.D.              DATE OF BIRTH:  1929/09/09   DATE OF ADMISSION:  DATE OF DISCHARGE:                                HISTORY & PHYSICAL   CHIEF COMPLAINT:  Pelvic support problems and urine incontinence.   HISTORY OF PRESENT ILLNESS:  Ms. Sicard is a 77 year old, gravida 5, para  3, AB2, with a history of hysterectomy in 20. She does not recall the  position.  Over the last years she has had progressive pelvic organ prolapse  with now grade 4 cystocele, grade 3 rectocele and enterocele with vaginal  bulk descensus. She has had a Barrier test with pessary placement which  increased her leakage greatly. She has been followed by Dr. Marcelyn Bruins for  her incontinence. She has some urge component, but her incontinence was  dramatically increased with replacement of her bladder to the usual anatomic  location by pessary placement, i.e., Barrier test. She is now admitted for  definitive therapy of her global pelvic organ prolapse by cystocele,  rectocele, and enterocele repairs by Dr. Nicholas Lose and by a pubovaginal sling  procedure by Dr. Brennan Bailey. She understands the risks and benefits of the  procedure, and alternative therapies all. She is now admitted for definitive  therapy as above.   PAST MEDICAL HISTORY:  Usual childhood diseases without sequela. Medical  illnesses--history of gallbladder disease in 1972 treated by Dr. Angie Fava;  hysterectomy in 1983, unknown physician. Current medical problems--  cholesterol elevation. Primary healthcare physician, Dr. Jacky Kindle.   FAMILY HISTORY:  Mother died of COPD at age 24.  Father died at age 38 of  heart disease. Two brothers living and well.  No other known familial  tendency disease.   ALLERGIES:   Bee stings, SULFA, MACROBID, CIPRO, NAPROSYN, LODINE.   CURRENT MEDICATIONS:  Synthroid, Plendil, Lozol, Detrol LA.   SOCIAL HISTORY:  Husband has Parkinson disease and spinal stenosis.  Her  children are grown. Both are retired. She is a retired Runner, broadcasting/film/video.   REVIEW OF SYSTEMS:  HEENT: Denies symptoms. CARDIORESPIRATORY:  Denies  asthma, cough, anxiety, or shortness of breath.  GI/GU: Denies mixed  incontinence pattern with both urge and stress, genuine stress incontinence  patterns; improved by Detrol, but worsened by Barrier test.   PHYSICAL EXAMINATION:  GENERAL: Well-developed, well-nourished, white  female, moderately over ideal weight.  HEENT: Pupils equal, round, and reactive to light and accommodation. Fundi  not examined. Oropharynx clear.  NECK: Supple without mass or thyroid enlargement.  CHEST: Clear to P&A.  HEART: Regular rhythm without murmur or cardiac enlargement.  ABDOMEN: Soft, scaphoid without mass or organomegaly.  PELVIC EXAM: External genitalia normal female vagina, widening of the  genitalia hiatus.  She has a grade 4 cystocele, grade 3 rectocele and  enterocele. She also has vaginal vault descensus post hysterectomy. She has  an enormous central fascial defect with extension all the way to the apex of  the vaginal cuff. She has detachment of the posterior perirectal fascia from  the apex to the cuff, with a large bulge of enterocele and rectocele, grade  3. The apex of the vaginal cuff extends significantly. Rectovaginal exam  confirms.  EXTREMITIES: Negative.  NEUROLOGIC: Physiologic.   IMPRESSION:  1. Global pelvic organ prolapse with cystocele, rectocele, and enterocele,     grade 3-4, and vaginal vault prolapse.  2. Mixed incontinence.   PLAN:  Cystocele, rectocele, and enterocele repair with colposuspension by  Dr. Nicholas Lose and Dr. Brennan Bailey to do the pubovaginal sling portion of the  procedure.                                               Gretta Cool, M.D.    CWL/MEDQ  D:  10/07/2003  T:  10/08/2003  Job:  [id number]   cc:   Geoffry Paradise, M.D.  [address]  [place]  [place] [address]  Fax: [phone]   Dr. Brennan Bailey

## 2010-10-30 NOTE — H&P (Signed)
NAME:  JACKLIN, ZWICK.:  [id number]   MEDICAL RECORD NO.:  [id number]          PATIENT TYPE:  EMS   LOCATION:  ED                           FACILITY:  [facility]   PHYSICIAN:  Ulyses Amor, MD DATE OF BIRTH:  [birth year]   DATE OF ADMISSION:  07/09/2004  DATE OF DISCHARGE:                                HISTORY & PHYSICAL   REASON FOR ADMISSION:  Autumn Allen is a 77 year old white woman who is  admitted to [facility] for further evaluation of chest  pain.   HISTORY OF PRESENT ILLNESS:  The patient, who has no past history of cardiac  disease, presented to the emergency department with a one-week history of  chest pain.  She has experienced perhaps five to six episodes a day over  this last week.  Episodes occurred at random and appeared not to be related  to position, activity, meals, or respiration.  There are not exacerbating or  ameliorating factors.  Each episode last at most one to two minutes and  resolved spontaneously.  There has been no change in the pattern in these  episodes over the last week.  The chest pain is described as a pressure in  the left anterior chest, left midaxillary line, left axilla, left shoulder,  and left upper arm.  She is asymptomatic at this time.   As noted, the patient has no past history of cardiac disease including no  history of chest pain, myocardial infarction, coronary artery disease,  congestive heart failure, or arrhythmia.  She has a number of risk factors  for coronary artery disease including hypertension, and a remote history of  smoking.  There is no history of diabetes mellitus or dyslipidemia.  Her  mother suffered from coronary artery disease but at an advanced age (over  40).   The patient's only other medical problem is that of hypothyroidism.   MEDICATIONS:  Synthroid, Lozol, and aspirin.   ALLERGIES:  CIPRO, EPHEDRINE, MACROBID, NAPROSYN, and SULFA.   PAST SURGICAL HISTORY:  1.  Cholecystectomy.  2.  Hysterectomy.  3.  Bladder tacking.   PAST MEDICAL HISTORY:  Significant injuries are none and hypothyroidism.   FAMILY HISTORY:  Her mother suffered from coronary artery disease in her  92s.  Father had no significant medical problems.   SOCIAL HISTORY:  The patient is a retired Engineer, site.  She lives with  her husband.  She neither smokes nor drink.   REVIEW OF SYMPTOMS:  Revealed no problems related to her head, eyes, ears,  nose, mouth, throat, lungs, gastrointestinal system, genitourinary system,  or extremities.  There is no history of neurologic psychiatric disorder.  There is no history of fever, chills, or weight loss.   PHYSICAL EXAMINATION:  VITAL SIGNS:  Blood pressure 160/77, pulse 68 and  regular, respirations 18, temperature 97.0.  GENERAL:  The patient was an older white woman in no distress.  She appeared  younger than her stated age.  She was alert, oriented, appropriate, and  responsive.  HEENT:  Normal.  NECK:  Without thyromegaly or adenopathy.  Carotid pulses were  palpable  bilaterally and without bruits.  CARDIOVASCULAR:  Normal S1 and S2.  There was no S3, S4, rub, or click.  There was a soft, systolic ejection murmur heard at the left sternal border.  CHEST:  No chest wall tenderness was noted.  LUNGS:  Clear.  ABDOMEN:  Soft and nontender.  There was no mass, hepatosplenomegaly,  bruits, distention, rebound, guarding, or rigidity.  Bowel sounds were  normal.  BREASTS:  Not performed as they were not pertinent to the reason for acute  care hospitalization.  PELVIC:  Not performed as they were not pertinent to the reason for acute  care hospitalization.  RECTAL:  Not performed as they were not pertinent to the reason for acute  care hospitalization.  EXTREMITIES:  Without edema, deviation, or deformity.  Radial and dorsalis  pedis pulses were palpable bilaterally.  NEUROLOGICAL:  Brief screening neurologic survey was  unremarkable.   LABORATORY DATA:  The electrocardiogram revealed normal sinus rhythm, the  possibility of a prior inferior and anterior myocardial infarction could not  be excluded.  T-wave inversion was present in V2 and V3.  Chest radiograph,  according to the radiologist, was normal.   Potassium was 3.5, BUN 17, creatinine 0.9.  The initial set of cardiac  markers revealed a myoglobin of 42.6, CK-MB of 2.0, and troponin less than  0.05.  The second set of cardiac markers revealed a myoglobin of 37.5, CK-MB  less than 1.0, and troponin less than 0.05.  White count was 7.9 with a  hemoglobin of 15.1 and hematocrit of 43.8.  The remaining studies were  pending at the time of this dictation.   IMPRESSION:  1.  Chest pain:  Rule out coronary artery disease.  The patient has a one-      week history of five to six episodes per day which occur at random, each      lasting one to two minutes and resolving spontaneously.  The chest pain      is described as a pressure in the left anterior chest, left midaxillary      line, left axilla, left shoulder, and left upper arm.  There was T-wave      inversion in V2 and V3.  The possibility of prior inferior and anterior      myocardial infarction could not be excluded on the electrocardiogram.      The first two sets of cardiac markers are negative.  2.  Hypertension.  3.  Hypothyroidism.   PLAN:  1.  Telemetry.  2.  Serial cardiac enzymes.  3.  Aspirin.  4.  Lovenox.  5.  Intravenous nitroglycerin.  6.  Fasting lipid profile.  7.  Further measures per Dr. Colleen Can. Deborah Chalk (the patient's primary care      physician referred Ms. Vanderberg to Dr. Colleen Can. Deborah Chalk).   DISPOSITION:  The patient's encountered was chaperoned by nurse D.  Hedgecock.      MSC/MEDQ  D:  07/09/2004  T:  07/09/2004  Job:  [id number]   cc:   Colleen Can. Deborah Chalk, M.D.  Fax: [phone]

## 2010-11-30 ENCOUNTER — Ambulatory Visit
Admission: RE | Admit: 2010-11-30 | Discharge: 2010-11-30 | Disposition: A | Discharge status: Home / Self Care | Payer: Medicare Other | Source: Ambulatory Visit | Attending: Internal Medicine | Admitting: Internal Medicine

## 2010-11-30 DIAGNOSIS — Z1231 Encounter for screening mammogram for malignant neoplasm of breast: Secondary | ICD-10-CM

## 2011-05-26 ENCOUNTER — Other Ambulatory Visit: Payer: Self-pay | Admitting: Dermatology

## 2011-11-03 ENCOUNTER — Other Ambulatory Visit: Payer: Self-pay | Admitting: Internal Medicine

## 2011-11-03 DIAGNOSIS — Z1231 Encounter for screening mammogram for malignant neoplasm of breast: Secondary | ICD-10-CM

## 2011-12-08 ENCOUNTER — Ambulatory Visit: Payer: Medicare Other

## 2011-12-17 ENCOUNTER — Ambulatory Visit: Payer: Medicare Other

## 2012-01-25 ENCOUNTER — Ambulatory Visit: Payer: Medicare Other

## 2012-01-26 ENCOUNTER — Ambulatory Visit
Admission: RE | Admit: 2012-01-26 | Discharge: 2012-01-26 | Disposition: A | Discharge status: Home / Self Care | Payer: Medicare Other | Source: Ambulatory Visit | Attending: Internal Medicine | Admitting: Internal Medicine

## 2012-01-26 DIAGNOSIS — Z1231 Encounter for screening mammogram for malignant neoplasm of breast: Secondary | ICD-10-CM

## 2012-03-20 ENCOUNTER — Ambulatory Visit (INDEPENDENT_AMBULATORY_CARE_PROVIDER_SITE_OTHER): Payer: Medicare Other | Admitting: *Deleted

## 2012-03-20 DIAGNOSIS — M79609 Pain in unspecified limb: Secondary | ICD-10-CM

## 2012-04-29 ENCOUNTER — Emergency Department (HOSPITAL_COMMUNITY)
Admission: EM | Admit: 2012-04-29 | Discharge: 2012-04-30 | Disposition: A | Discharge status: Home / Self Care | Payer: Medicare Other | Attending: Emergency Medicine | Admitting: Emergency Medicine

## 2012-04-29 ENCOUNTER — Encounter (HOSPITAL_COMMUNITY): Payer: Self-pay | Admitting: Physical Medicine and Rehabilitation

## 2012-04-29 ENCOUNTER — Emergency Department (HOSPITAL_COMMUNITY): Payer: Medicare Other

## 2012-04-29 DIAGNOSIS — R110 Nausea: Secondary | ICD-10-CM | POA: Insufficient documentation

## 2012-04-29 DIAGNOSIS — R109 Unspecified abdominal pain: Secondary | ICD-10-CM

## 2012-04-29 DIAGNOSIS — N390 Urinary tract infection, site not specified: Secondary | ICD-10-CM | POA: Insufficient documentation

## 2012-04-29 DIAGNOSIS — E785 Hyperlipidemia, unspecified: Secondary | ICD-10-CM | POA: Insufficient documentation

## 2012-04-29 DIAGNOSIS — G4730 Sleep apnea, unspecified: Secondary | ICD-10-CM | POA: Insufficient documentation

## 2012-04-29 DIAGNOSIS — I10 Essential (primary) hypertension: Secondary | ICD-10-CM | POA: Insufficient documentation

## 2012-04-29 DIAGNOSIS — E039 Hypothyroidism, unspecified: Secondary | ICD-10-CM | POA: Insufficient documentation

## 2012-04-29 HISTORY — DX: Essential (primary) hypertension: I10

## 2012-04-29 HISTORY — DX: Hypothyroidism, unspecified: E03.9

## 2012-04-29 HISTORY — DX: Hyperlipidemia, unspecified: E78.5

## 2012-04-29 HISTORY — DX: Sleep apnea, unspecified: G47.30

## 2012-04-29 LAB — URINALYSIS, ROUTINE W REFLEX MICROSCOPIC
Glucose, UA: NEGATIVE mg/dL
Hgb urine dipstick: NEGATIVE
Ketones, ur: NEGATIVE mg/dL
Protein, ur: NEGATIVE mg/dL
pH: 6.5 (ref 5.0–8.0)

## 2012-04-29 LAB — CBC WITH DIFFERENTIAL/PLATELET
Basophils Absolute: 0 10*3/uL (ref 0.0–0.1)
Basophils Relative: 0 % (ref 0–1)
Eosinophils Absolute: 0.3 10*3/uL (ref 0.0–0.7)
Hemoglobin: 14.1 g/dL (ref 12.0–15.0)
Lymphocytes Relative: 19 % (ref 12–46)
MCV: 97.4 fL (ref 78.0–100.0)
Neutro Abs: 6.8 10*3/uL (ref 1.7–7.7)
Neutrophils Relative %: 70 % (ref 43–77)
Platelets: 284 10*3/uL (ref 150–400)
RBC: 4.19 MIL/uL (ref 3.87–5.11)
RDW: 12.2 % (ref 11.5–15.5)
WBC: 9.8 10*3/uL (ref 4.0–10.5)

## 2012-04-29 LAB — COMPREHENSIVE METABOLIC PANEL
Alkaline Phosphatase: 66 U/L (ref 39–117)
BUN: 17 mg/dL (ref 6–23)
CO2: 28 mEq/L (ref 19–32)
Calcium: 9.4 mg/dL (ref 8.4–10.5)
Sodium: 137 mEq/L (ref 135–145)

## 2012-04-29 LAB — LIPASE, BLOOD: Lipase: 33 U/L (ref 11–59)

## 2012-04-29 MED ORDER — IOHEXOL 300 MG/ML  SOLN
20.0000 mL | INTRAMUSCULAR | Status: DC
  Administered 2012-04-29: 20 mL via ORAL

## 2012-04-29 MED ORDER — CEPHALEXIN 500 MG PO CAPS: 500.0000 mg | ORAL_CAPSULE | Freq: Three times a day (TID) | ORAL | Status: DC

## 2012-04-29 MED ORDER — CEPHALEXIN 250 MG PO CAPS
500.0000 mg | ORAL_CAPSULE | Freq: Once | ORAL | Status: AC
  Administered 2012-04-30: 500 mg via ORAL
  Filled 2012-04-29: qty 2

## 2012-04-29 MED ORDER — IOHEXOL 300 MG/ML  SOLN
80.0000 mL | Freq: Once | INTRAMUSCULAR | Status: AC | PRN
  Administered 2012-04-29: 80 mL via INTRAVENOUS

## 2012-04-29 NOTE — ED Notes (Signed)
Pt presents to department via [facility] for evaluation of RLQ abdominal pain x1 week. Also states nausea and decreased appetite. Denies urinary symptoms. 5/10 pain upon arrival to ED. She is alert and oriented x4. No signs of acute distress noted.

## 2012-04-29 NOTE — ED Notes (Signed)
Patient transported to CT 

## 2012-04-29 NOTE — ED Provider Notes (Signed)
History     CSN: [id number]  Arrival date & time 04/29/12  4098   First MD Initiated Contact with Patient 04/29/12 2058      Chief Complaint  Patient presents with  . Abdominal Pain    (Consider location/radiation/quality/duration/timing/severity/associated sxs/prior treatment) HPI Comments: Patient presents with abdominal pain. She states that she's had some intermittent cramping in her abdomen for about 2 weeks off and on. Today she had more persistent pain that was localized to the right lower quadrant. Spinning constant and worsening throughout today. She's had some nausea but no vomiting. She's had some decreased appetite. She denies any urinary symptoms. She denies any fevers or chills. She denies any blood in her stool or melena. She denies any diarrhea or constipation. She's had a cholecystectomy and a hysterectomy but no other abdominal surgeries.   Past Medical History  Diagnosis Date  . Hypertension   . Hypothyroid   . Hyperlipemia   . Sleep apnea     No past surgical history on file.  No family history on file.  History  Substance Use Topics  . Smoking status: Never Smoker   . Smokeless tobacco: Not on file  . Alcohol Use: No    OB History    Grav Para Term Preterm Abortions TAB SAB Ect Mult Living                  Review of Systems  Constitutional: Positive for appetite change. Negative for fever, chills, diaphoresis and fatigue.  HENT: Negative for congestion, rhinorrhea and sneezing.   Eyes: Negative.   Respiratory: Negative for cough, chest tightness and shortness of breath.   Cardiovascular: Negative for chest pain and leg swelling.  Gastrointestinal: Positive for nausea and abdominal pain. Negative for vomiting, diarrhea and blood in stool.  Genitourinary: Negative for frequency, hematuria, flank pain and difficulty urinating.  Musculoskeletal: Negative for back pain and arthralgias.  Skin: Negative for rash.  Neurological: Negative for  dizziness, speech difficulty, weakness, numbness and headaches.    Allergies  Ciprofloxacin; Etodolac; Naproxen; Nitrofurantoin; and Sulfonamide derivatives  Home Medications   Current Outpatient Rx  Name  Route  Sig  Dispense  Refill  . ASPIRIN EC 81 MG PO TBEC   Oral   Take 81 mg by mouth daily.         . B COMPLEX MAXI PO   Oral   Take 1 tablet by mouth daily.         Marland Kitchen BIOTIN PO   Oral   Take 1 capsule by mouth daily.         Marland Kitchen CINNAMON PO   Oral   Take 1,000 mg by mouth every evening.         Marland Kitchen VITAMIN D2 PO   Oral   Take 1 capsule by mouth daily.         Marland Kitchen ESCITALOPRAM OXALATE 10 MG PO TABS   Oral   Take 10 mg by mouth daily.         Marland Kitchen FELODIPINE ER 5 MG PO TB24   Oral   Take 5 mg by mouth daily.         . OMEGA-3 FATTY ACIDS 1000 MG PO CAPS   Oral   Take 1 g by mouth 2 (two) times daily.         . INDAPAMIDE 2.5 MG PO TABS   Oral   Take 2.5 mg by mouth daily.         Marland Kitchen  LEVOTHYROXINE SODIUM 50 MCG PO TABS   Oral   Take 50 mcg by mouth daily.         Marland Kitchen METHENAMINE HIPPURATE 1 G PO TABS   Oral   Take 1 g by mouth 2 (two) times daily with a meal.         . RED YEAST RICE PO   Oral   Take 1 capsule by mouth daily.         . CEPHALEXIN 500 MG PO CAPS   Oral   Take 1 capsule (500 mg total) by mouth 3 (three) times daily.   21 capsule   0     BP 162/58  Pulse 55  Temp 98.5 F (36.9 C) (Oral)  Resp 18  SpO2 96%  Physical Exam  Constitutional: She is oriented to person, place, and time. She appears well-developed and well-nourished.  HENT:  Head: Normocephalic and atraumatic.  Eyes: Pupils are equal, round, and reactive to light.  Neck: Normal range of motion. Neck supple.  Cardiovascular: Normal rate, regular rhythm and normal heart sounds.   Pulmonary/Chest: Effort normal and breath sounds normal. No respiratory distress. She has no wheezes. She has no rales. She exhibits no tenderness.  Abdominal: Soft. Bowel  sounds are normal. There is tenderness (Moderate tenderness in the right lower quadrant). There is no rebound and no guarding.  Musculoskeletal: Normal range of motion. She exhibits no edema.  Lymphadenopathy:    She has no cervical adenopathy.  Neurological: She is alert and oriented to person, place, and time.  Skin: Skin is warm and dry. No rash noted.  Psychiatric: She has a normal mood and affect.    ED Course  Procedures (including critical care time)  Results for orders placed during the hospital encounter of 04/29/12  CBC WITH DIFFERENTIAL      Component Value Range   WBC 9.8  4.0 - 10.5 K/uL   RBC 4.19  3.87 - 5.11 MIL/uL   Hemoglobin 14.1  12.0 - 15.0 g/dL   HCT 16.1  09.6 - 04.5 %   MCV 97.4  78.0 - 100.0 fL   MCH 33.7  26.0 - 34.0 pg   MCHC 34.6  30.0 - 36.0 g/dL   RDW 40.9  81.1 - 91.4 %   Platelets 284  150 - 400 K/uL   Neutrophils Relative 70  43 - 77 %   Neutro Abs 6.8  1.7 - 7.7 K/uL   Lymphocytes Relative 19  12 - 46 %   Lymphs Abs 1.8  0.7 - 4.0 K/uL   Monocytes Relative 8  3 - 12 %   Monocytes Absolute 0.8  0.1 - 1.0 K/uL   Eosinophils Relative 3  0 - 5 %   Eosinophils Absolute 0.3  0.0 - 0.7 K/uL   Basophils Relative 0  0 - 1 %   Basophils Absolute 0.0  0.0 - 0.1 K/uL  COMPREHENSIVE METABOLIC PANEL      Component Value Range   Sodium 137  135 - 145 mEq/L   Potassium 3.6  3.5 - 5.1 mEq/L   Chloride 98  96 - 112 mEq/L   CO2 28  19 - 32 mEq/L   Glucose, Bld 119 (*) 70 - 99 mg/dL   BUN 17  6 - 23 mg/dL   Creatinine, Ser 7.82  0.50 - 1.10 mg/dL   Calcium 9.4  8.4 - 95.6 mg/dL   Total Protein 7.2  6.0 - 8.3 g/dL   Albumin  3.6  3.5 - 5.2 g/dL   AST 21  0 - 37 U/L   ALT 17  0 - 35 U/L   Alkaline Phosphatase 66  39 - 117 U/L   Total Bilirubin 0.3  0.3 - 1.2 mg/dL   GFR calc non Af Amer 79 (*) >90 mL/min   GFR calc Af Amer >90  >90 mL/min  LIPASE, BLOOD      Component Value Range   Lipase 33  11 - 59 U/L  URINALYSIS, ROUTINE W REFLEX MICROSCOPIC       Component Value Range   Color, Urine YELLOW  YELLOW   APPearance CLEAR  CLEAR   Specific Gravity, Urine 1.009  1.005 - 1.030   pH 6.5  5.0 - 8.0   Glucose, UA NEGATIVE  NEGATIVE mg/dL   Hgb urine dipstick NEGATIVE  NEGATIVE   Bilirubin Urine NEGATIVE  NEGATIVE   Ketones, ur NEGATIVE  NEGATIVE mg/dL   Protein, ur NEGATIVE  NEGATIVE mg/dL   Urobilinogen, UA 0.2  0.0 - 1.0 mg/dL   Nitrite NEGATIVE  NEGATIVE   Leukocytes, UA MODERATE (*) NEGATIVE  URINE MICROSCOPIC-ADD ON      Component Value Range   Squamous Epithelial / LPF FEW (*) RARE   WBC, UA 3-6  <3 WBC/hpf   Bacteria, UA RARE  RARE   Ct Abdomen Pelvis W Contrast  04/29/2012  *RADIOLOGY REPORT*  Clinical Data: Right lower quadrant abdominal pain  CT ABDOMEN AND PELVIS WITH CONTRAST  Technique:  Multidetector CT imaging of the abdomen and pelvis was performed following the standard protocol during bolus administration of intravenous contrast.  Contrast: 80mL OMNIPAQUE IOHEXOL 300 MG/ML  SOLN  Comparison: None.  Findings: Mild opacity right lung base, likely atelectasis.  Heart size within normal limits.  No pleural or pericardial effusion. Cystic foci within the liver most in keeping with biliary cysts or hamartoma.  Unremarkable spleen and, pancreas, adrenal glands.  Absent gallbladder.  There is mild CBD prominence up to 11 mm with smooth tapering to the level of the ampulla. There may be a low insertion of the cystic duct.  Symmetric renal enhancement.  No hydronephrosis or hydroureter.  No bowel obstruction.  No CT evidence for colitis.  Appendix not identified.  No right lower quadrant inflammation.   No free intraperitoneal air or fluid.  No lymphadenopathy. Nonspecific mildly prominent porta hepatis lymph node measuring 9 mm short axis.  There is scattered atherosclerotic calcification of the aorta and its branches. No aneurysmal dilatation.  Thin-walled bladder.  Absent uterus.  No adnexal mass.  Mild multilevel degenerative changes of  the imaged spine. No acute or aggressive appearing osseous lesion.  IMPRESSION: Appendix not identified.  No right lower quadrant inflammation.  Mild CBD prominence is nonspecific post cholecystectomy.  Correlate with LFTs and ERCP or MRCP if warranted.   Original Report Authenticated By: Jearld Lesch, M.D.      1. Abdominal pain   2. UTI (lower urinary tract infection)       MDM  Pt well appearing.  Non-toxic.  Will tx UTI and have pt f/u with her PMD on Monday (she has an appt ) for a reexam of her abdomen.  Advised to return here if her pain worsens or she develops fever/vomiting.        Rolan Bucco, MD 04/29/12 2351

## 2012-04-29 NOTE — ED Notes (Signed)
Pt states that she took align, but it didn't help.

## 2012-05-02 LAB — URINE CULTURE: Colony Count: 75000

## 2012-05-03 NOTE — ED Notes (Signed)
+   Urine Patient treated with keflex-sensitive to same-chart appended per protocol MD. 

## 2012-05-17 ENCOUNTER — Other Ambulatory Visit: Payer: Self-pay | Admitting: Gastroenterology

## 2012-08-03 ENCOUNTER — Ambulatory Visit (INDEPENDENT_AMBULATORY_CARE_PROVIDER_SITE_OTHER): Payer: 59 | Admitting: Physician Assistant

## 2012-08-03 ENCOUNTER — Encounter: Payer: Self-pay | Admitting: Physician Assistant

## 2012-08-03 ENCOUNTER — Telehealth: Payer: Self-pay | Admitting: *Deleted

## 2012-08-03 VITALS — BP 138/65 | HR 54 | Ht 65.0 in | Wt 170.4 lb

## 2012-08-03 DIAGNOSIS — G4733 Obstructive sleep apnea (adult) (pediatric): Secondary | ICD-10-CM

## 2012-08-03 DIAGNOSIS — R002 Palpitations: Secondary | ICD-10-CM

## 2012-08-03 DIAGNOSIS — E039 Hypothyroidism, unspecified: Secondary | ICD-10-CM

## 2012-08-03 DIAGNOSIS — I10 Essential (primary) hypertension: Secondary | ICD-10-CM

## 2012-08-03 LAB — BASIC METABOLIC PANEL
CO2: 32 mEq/L (ref 19–32)
Chloride: 104 mEq/L (ref 96–112)
Creatinine, Ser: 0.8 mg/dL (ref 0.4–1.2)
Glucose, Bld: 94 mg/dL (ref 70–99)
Sodium: 141 mEq/L (ref 135–145)

## 2012-08-03 NOTE — Telephone Encounter (Signed)
Message copied by Tarri Fuller on Thu Aug 03, 2012  6:01 PM ------      Message from: [place], [place] T      Created: Thu Aug 03, 2012  5:50 PM       Labs ok       Continue with current treatment plan.      Tereso Newcomer, PA-C  5:49 PM 08/03/2012 ------

## 2012-08-03 NOTE — Progress Notes (Signed)
[address] [place], [place]  [address] Phone: [phone], Fax:  [phone]  Date:  08/03/2012   ID:  Autumn Allen, DOB 04-Oct-1929, MRN [id number]  PCP:  No primary provider on file.  Primary Cardiologist:  Dr. Marca Ancona     History of Present Illness: Autumn Allen is a 79 y.o. female who returns for evaluation of palpitations.  She has a history of HTN, HL. She had a normal myoview in 2006.  She was evaluated by Dr. Shirlee Latch in 08/2010 for chest pain. Stress echo was performed and this was normal. She had a murmur on exam but no significant abnormalities were reported on her echocardiogram. She was to follow up with Dr. Shirlee Latch as needed. She has recently has noted rapid palpitations that occur typically in the morning. She denies assoc dyspnea, syncope, near syncope, chest pain.  She continues to work out at [employer] 3 x a week and swims x 1 hour.  No exertional chest pain or dyspnea.  No decreased exercise tolerance.  No orthopnea, PND, edema.    Labs (11/13):  K 3.6, creatinine 0.72, ALT 17, Hgb 14.1  Wt Readings from Last 3 Encounters:  08/14/10 179 lb (81.194 kg)     Past Medical History  Diagnosis Date  . Hypertension   . Hypothyroid   . Hyperlipemia   . Sleep apnea   . Depression   . Chronic cystitis   . Plantar fasciitis   . S/P TAH-BSO   . S/P cholecystectomy   . Hx of cardiovascular stress test     a. ETT-Echo 3/12:  EF 60%, normal study  . OSA on CPAP     Current Outpatient Prescriptions  Medication Sig Dispense Refill  . aspirin EC 81 MG tablet Take 81 mg by mouth daily.      . B Complex-Folic Acid (B COMPLEX MAXI PO) Take 1 tablet by mouth daily.      Marland Kitchen BIOTIN PO Take 1 capsule by mouth daily.      . cephALEXin (KEFLEX) 500 MG capsule Take 1 capsule (500 mg total) by mouth 3 (three) times daily.  21 capsule  0  . CINNAMON PO Take 1,000 mg by mouth every evening.      . Ergocalciferol (VITAMIN D2 PO) Take 1 capsule by  mouth daily.      Marland Kitchen escitalopram (LEXAPRO) 10 MG tablet Take 10 mg by mouth daily.      . felodipine (PLENDIL) 5 MG 24 hr tablet Take 5 mg by mouth daily.      . fish oil-omega-3 fatty acids 1000 MG capsule Take 1 g by mouth 2 (two) times daily.      . indapamide (LOZOL) 2.5 MG tablet Take 2.5 mg by mouth daily.      Marland Kitchen levothyroxine (SYNTHROID, LEVOTHROID) 50 MCG tablet Take 50 mcg by mouth daily.      . methenamine (HIPREX) 1 G tablet Take 1 g by mouth 2 (two) times daily with a meal.      . Red Yeast Rice Extract (RED YEAST RICE PO) Take 1 capsule by mouth daily.       No current facility-administered medications for this visit.    Allergies:    Allergies  Allergen Reactions  . Ciprofloxacin Other (See Comments)    Reaction unknown  . Etodolac Other (See Comments)    Reaction unknown  . Naproxen Other (See Comments)    Reaction unknown  . Nitrofurantoin Other (See  Comments)    Reaction unknown  . Sulfonamide Derivatives Other (See Comments)    Reaction unknown    Social History:  The patient  reports that she has never smoked. She does not have any smokeless tobacco history on file. She reports that she does not drink alcohol or use illicit drugs.   Family History:  The patient's family history includes Heart attack (age of onset: 45) in her mother and Heart failure in her mother.   ROS:  Please see the history of present illness.    All other systems reviewed and negative.   PHYSICAL EXAM: VS:  BP 138/65  Pulse 54  Ht 5\' 5"  (1.651 m)  Wt 170 lb 6.4 oz (77.293 kg)  BMI 28.36 kg/m2 Well nourished, well developed, in no acute distress HEENT: normal Neck: no JVD Vascular: no carotid bruits Cardiac:  normal S1, S2; RRR; 1/6 systolic murmur at the RUSB Lungs:  clear to auscultation bilaterally, no wheezing, rhonchi or rales Abd: soft, nontender, no hepatomegaly Ext: no edema Skin: warm and dry Neuro:  CNs 2-12 intact, no focal abnormalities noted  EKG:  Sinus  bradycardia, HR 54, somewhat peaked T waves, no change when compared to prior tracing in 2012     ASSESSMENT AND PLAN:  1. Palpitations: Etiology not entirely clear. She does have a history of hypertension and sleep apnea. She is certainly at risk for atrial fibrillation. She would require long-term anticoagulation should we document atrial fibrillation. I will arrange an echocardiogram and a 30 day event monitor. I will also check a TSH and basic metabolic panel today. 2. Hypertension:  Controlled. 3. Hypothyroidism: Check a TSH today. 4. Sleep Apnea:  She remains on CPAP. 5. Disposition:  She can see me back in 6 weeks.  Signed, Tereso Newcomer, PA-C  2:17 PM 08/03/2012

## 2012-08-03 NOTE — Patient Instructions (Addendum)
LAB TODAY; BMET, TSH  Your physician has requested that you have an echocardiogram. Echocardiography is a painless test that uses sound waves to create images of your heart. It provides your doctor with information about the size and shape of your heart and how well your heart's chambers and valves are working. This procedure takes approximately one hour. There are no restrictions for this procedure.  Your physician has recommended that you wear an event monitor. Event monitors are medical devices that record the heart's electrical activity. Doctors most often [place] these monitors to diagnose arrhythmias. Arrhythmias are problems with the speed or rhythm of the heartbeat. The monitor is a small, portable device. You can wear one while you do your normal daily activities. This is usually used to diagnose what is causing palpitations/syncope (passing out).  PLEASE MAKE A FOLLOW UP APPT FOR APPROX 6 WEEKS WITH SCOTT WEAVER, PAC SAME DAY DR. Shirlee Latch IS IN THE OFFICE

## 2012-08-03 NOTE — Telephone Encounter (Signed)
pt notified of lab results with verbal understanding 

## 2012-08-10 ENCOUNTER — Ambulatory Visit (HOSPITAL_COMMUNITY): Payer: Medicare Other | Attending: Physician Assistant | Admitting: Radiology

## 2012-08-10 ENCOUNTER — Ambulatory Visit (INDEPENDENT_AMBULATORY_CARE_PROVIDER_SITE_OTHER): Payer: 59

## 2012-08-10 ENCOUNTER — Encounter: Payer: Self-pay | Admitting: Physician Assistant

## 2012-08-10 DIAGNOSIS — R002 Palpitations: Secondary | ICD-10-CM

## 2012-08-10 NOTE — Progress Notes (Signed)
Echocardiogram performed.  

## 2012-08-10 NOTE — Progress Notes (Signed)
Placed a 30 day event monitor on patient and went over instructions on how to use it and when to return it 

## 2012-08-11 ENCOUNTER — Telehealth: Payer: Self-pay | Admitting: *Deleted

## 2012-08-11 NOTE — Telephone Encounter (Signed)
pt notified about echo results today with verbal understanding

## 2012-08-11 NOTE — Telephone Encounter (Signed)
Message copied by Tarri Fuller on Fri Aug 11, 2012  2:30 PM ------      Message from: [place], [place] T      Created: Thu Aug 10, 2012 10:09 PM       Normal LVF      Mild to mod Aortic Stenosis      Plan repeat echo in 1 year      Tereso Newcomer, [place]  10:09 PM 08/10/2012 ------

## 2012-08-11 NOTE — Telephone Encounter (Signed)
Message copied by Tarri Fuller on Fri Aug 11, 2012 12:53 PM ------      Message from: [place], [place] T      Created: Thu Aug 10, 2012 10:09 PM       Normal LVF      Mild to mod Aortic Stenosis      Plan repeat echo in 1 year      Tereso Newcomer, [place]  10:09 PM 08/10/2012 ------

## 2012-09-14 ENCOUNTER — Ambulatory Visit: Payer: 59 | Admitting: Physician Assistant

## 2012-09-15 ENCOUNTER — Encounter: Payer: Self-pay | Admitting: Physician Assistant

## 2012-09-15 ENCOUNTER — Ambulatory Visit (INDEPENDENT_AMBULATORY_CARE_PROVIDER_SITE_OTHER): Payer: 59 | Admitting: Physician Assistant

## 2012-09-15 VITALS — BP 132/68 | HR 53 | Ht 65.0 in | Wt 170.8 lb

## 2012-09-15 DIAGNOSIS — R002 Palpitations: Secondary | ICD-10-CM

## 2012-09-15 DIAGNOSIS — I359 Nonrheumatic aortic valve disorder, unspecified: Secondary | ICD-10-CM

## 2012-09-15 NOTE — Patient Instructions (Addendum)
Your physician wants you to follow-up in: 6 months with DR. MCLEAN. You will receive a reminder letter in the mail two months in advance. If you don't receive a letter, please call our office to schedule the follow-up appointment.  NO CHANGES WERE MADE TODAY  Your physician has requested that you have an echocardiogram TO BE DONE IN 1 YEAR, DX 785.1, 424.1. Echocardiography is a painless test that uses sound waves to create images of your heart. It provides your doctor with information about the size and shape of your heart and how well your heart's chambers and valves are working. This procedure takes approximately one hour. There are no restrictions for this procedure.

## 2012-09-15 NOTE — Progress Notes (Signed)
[address], [address] [place], [place]  [address] Phone: [phone], Fax:  [phone]  Date:  09/15/2012   ID:  Autumn Allen, DOB September 10, 1929, MRN [id number]  PCP:  Minda Meo, MD  Primary Cardiologist:  Dr. Marca Ancona     History of Present Illness: Autumn Allen is a 79 y.o. female who returns for f/u on palpitations.  She has a history of HTN, HL. She had a normal myoview in 2006.  She was evaluated by Dr. Shirlee Latch in 08/2010 for chest pain. Stress echo was performed and this was normal. She had a murmur on exam but no significant abnormalities were reported on her echocardiogram.  I saw her recently with a c/o rapid palpitations that occur typically in the morning. She had no assoc dyspnea, syncope, near syncope, chest pain.  She has continued to work out at the [employer] 3 x a week and swims x 1 hour.  No exertional chest pain or dyspnea.  No decreased exercise tolerance.  No orthopnea, PND, edema.  I arranged an event monitor and and echo.  Echo 08/10/12:  Mild LVH, EF 60-65%, Gr 1 DD, mild to mod AS (mean 17), trivial MR, mild LAE, PASP 31.  I have reviewed the event monitor today and it demonstrates NSR/sinus brady; no arrhythmias.  She is doing well.  Has had less palpitations since I last saw her.    Labs (11/13):  K 3.6, creatinine 0.72, ALT 17, Hgb 14.1 Labs (2/14):    K 4.4, Cr 0.8, TSH 0.97  Wt Readings from Last 3 Encounters:  09/15/12 170 lb 12.8 oz (77.474 kg)  08/03/12 170 lb 6.4 oz (77.293 kg)  08/14/10 179 lb (81.194 kg)     Past Medical History  Diagnosis Date  . Hypertension   . Hypothyroid   . Hyperlipemia   . Sleep apnea   . Depression   . Chronic cystitis   . Plantar fasciitis   . S/P TAH-BSO   . S/P cholecystectomy   . Hx of cardiovascular stress test     a. ETT-Echo 3/12:  EF 60%, normal study  . OSA on CPAP   . Hx of echocardiogram     a. Echo 2/14:  Mild LVH, EF 60-65%, Gr 1 diast dysfn, mild to mod AS, mean 17 mmHg,  AVA 1.3 (VTI), trivial MR, mild LAE, PASP 31   . Aortic stenosis     a. mild to mod by Echo 07/2012  . Palpitations     a. event monitor 3/14:  NSR, sinus brady    Current Outpatient Prescriptions  Medication Sig Dispense Refill  . aspirin EC 81 MG tablet Take 81 mg by mouth daily.      . B Complex-Folic Acid (B COMPLEX MAXI PO) Take 1 tablet by mouth daily.      Marland Kitchen BIOTIN PO Take 1 capsule by mouth daily.      Marland Kitchen CINNAMON PO Take 1,000 mg by mouth every evening.      . Ergocalciferol (VITAMIN D2 PO) Take 1 capsule by mouth daily.      . felodipine (PLENDIL) 5 MG 24 hr tablet Take 5 mg by mouth daily.      . fish oil-omega-3 fatty acids 1000 MG capsule Take 1 g by mouth 2 (two) times daily.      . indapamide (LOZOL) 2.5 MG tablet Take 2.5 mg by mouth daily.      Marland Kitchen ketoconazole (NIZORAL) 2 % shampoo Apply 1 application  topically as needed.       Marland Kitchen levothyroxine (SYNTHROID, LEVOTHROID) 50 MCG tablet Take 50 mcg by mouth daily.      Marland Kitchen METRONIDAZOLE, TOPICAL, 0.75 % LOTN Apply topically.       . nystatin-triamcinolone (MYCOLOG II) cream Apply 1 application topically as needed.       . Red Yeast Rice Extract (RED YEAST RICE PO) Take 1 capsule by mouth daily.       No current facility-administered medications for this visit.    Allergies:    Allergies  Allergen Reactions  . Ciprofloxacin Other (See Comments)    Reaction unknown  . Etodolac Other (See Comments)    Reaction unknown  . Naproxen Other (See Comments)    Reaction unknown  . Nitrofurantoin Other (See Comments)    Reaction unknown  . Sulfonamide Derivatives Other (See Comments)    Reaction unknown  . Alprazolam Rash  . Epinephrine Palpitations  . Macrobid (Nitrofurantoin Macrocrystal) Rash    Social History:  The patient  reports that she has never smoked. She does not have any smokeless tobacco history on file. She reports that she does not drink alcohol or use illicit drugs.   ROS:  Please see the history of present  illness.    All other systems reviewed and negative.   PHYSICAL EXAM: VS:  BP 132/68  Pulse 53  Ht 5\' 5"  (1.651 m)  Wt 170 lb 12.8 oz (77.474 kg)  BMI 28.42 kg/m2 Well nourished, well developed, in no acute distress HEENT: normal Neck: no JVD Vascular: no carotid bruits Cardiac:  normal S1, S2; RRR; 1/6 systolic murmur at the RUSB Lungs:  clear to auscultation bilaterally, no wheezing, rhonchi or rales Abd: soft, nontender, no hepatomegaly Ext: no edema Skin: warm and dry Neuro:  CNs 2-12 intact, no focal abnormalities noted  EKG:  Sinus bradycardia, HR 53, no change when compared to prior tracings  ASSESSMENT AND PLAN:  1. Palpitations:  No AFib on monitor or other arrhythmias.  She likely has symptomatic PACs.  Continue current Rx. 2. Aortic Stenosis:  Mild to moderate by recent echo.  She is asymptomatic.  We discussed the role for follow up echocardiograms and natural course.  Arrange echo in 1 year.  3. Hypertension:  Controlled. 4. Disposition:  F/u with Dr. Marca Ancona in 6 mos.  SignedTereso Newcomer, PA-C  11:48 AM 09/15/2012

## 2013-11-10 ENCOUNTER — Ambulatory Visit (INDEPENDENT_AMBULATORY_CARE_PROVIDER_SITE_OTHER): Payer: 59 | Admitting: Family Medicine

## 2013-11-10 VITALS — BP 118/66 | HR 65 | Temp 98.7°F | Resp 20

## 2013-11-10 DIAGNOSIS — S0990XA Unspecified injury of head, initial encounter: Secondary | ICD-10-CM

## 2013-11-10 DIAGNOSIS — S0180XA Unspecified open wound of other part of head, initial encounter: Secondary | ICD-10-CM

## 2013-11-10 DIAGNOSIS — S8002XA Contusion of left knee, initial encounter: Secondary | ICD-10-CM

## 2013-11-10 DIAGNOSIS — S51009A Unspecified open wound of unspecified elbow, initial encounter: Secondary | ICD-10-CM

## 2013-11-10 DIAGNOSIS — S8000XA Contusion of unspecified knee, initial encounter: Secondary | ICD-10-CM

## 2013-11-10 NOTE — Progress Notes (Signed)
Verbal consent obtained from patient.  Local anesthesia with 4cc Lidocaine 2% without epinephrine.  Wound scrubbed with soap and water and rinsed.  Wound closed with #2 5-0 Prolene Horizontal Mattress sutures.  Wound cleansed and dressed.

## 2013-11-10 NOTE — Progress Notes (Signed)
Subjective:  80 year old lady who tripped on the sidewalk and fell forward on her face. Her friend was with her. She lives in a apartment at a nursing care facility come independent living. They do have a nurse there. She fell flat on her face. She felt a little woozy when she got up. It took her second get her mind clear and talking. No loss of consciousness. She fell and in the glasses were scraped up, with the left upper of the glasses cutting her for head. She has abrasions on her nose and left cheek, left elbow has a skin tear, and left knee was contused. She gradually got feeling less dazed. Coordination was good. She was able to walk, but someone had to assist her because she felt a little shaky.  Objective: Fully alert and oriented and conversational. Eyes PERRLA. Left forehead has a laceration about 2 CM so. Her nose and cheek have superficial abrasions. No nasal bleeding. Her left elbow has a 3 CM circumferential skin tear. The left knee is swollen and erythematous just below the patella. Sclerae superficial abrasion. It is tender to touch. She is able to bear weight and walk with a normal gait.  Assessment: Left forehead laceration  abrasion of nose and cheek Left elbow superficial laceration Left knee contusion Possible very mild percussion  Plan: I do not believe that x-rays are needed at this time. Needs to be checked on tonight by the nurse from her residential facility if possible Sutures will be placed in the forehead. The elbow was cleaned and repaired with Steri-Strips Nasal and cheek abrasion should resolve on their own Apply ice to the left knee   Repeat neurologic exam was done at the end of the visit. Finger to nose normal. Romberg negative. No palmar drift. Fully alert and oriented. I think she just got an old a little bit from the fall, though this could of been a very mild concussion. She will get the nurse to check on her during the night. Cautioned her to go to the ER  if any neurologic concerns. She'll be coming back in in about 5 days to get the sutures removed, and we will need to make sure she is okay before she makes a trip to [place].

## 2013-11-10 NOTE — Patient Instructions (Addendum)
Apply ice to left knee Keep left elbow dressed, change dressing every couple of days Sutures will be removed from the forehead in about 5 or 6 days Ask the nurse at your facility to check on you late this evening again during the night if possible. Go to the emergency room if any concerns. Consider using a cane  WOUND CARE Please return in 5-6 days to have your stitches/staples removed or sooner if you have concerns. Marland Kitchen Keep area clean and dry for 24 hours. Do not remove bandage, if applied. . After 24 hours, remove bandage and wash wound gently with mild soap and warm water. Reapply a new bandage after cleaning wound, if directed. . Continue daily cleansing with soap and water until stitches/staples are removed. . Do not apply any ointments or creams to the wound while stitches/staples are in place, as this may cause delayed healing. . Notify the office if you experience any of the following signs of infection: Swelling, redness, pus drainage, streaking, fever >101.0 F . Notify the office if you experience excessive bleeding that does not stop after 15-20 minutes of constant, firm pressure.

## 2013-11-15 ENCOUNTER — Ambulatory Visit (INDEPENDENT_AMBULATORY_CARE_PROVIDER_SITE_OTHER): Payer: 59 | Admitting: Physician Assistant

## 2013-11-15 VITALS — BP 116/72 | HR 54 | Temp 98.2°F | Resp 18 | Wt 184.0 lb

## 2013-11-15 DIAGNOSIS — S0180XA Unspecified open wound of other part of head, initial encounter: Secondary | ICD-10-CM

## 2013-11-15 NOTE — Progress Notes (Signed)
   Subjective:    Patient ID: Autumn Allen, female    DOB: [birth year], 80 y.o.   MRN: [id number]  Suture / Staple Removal  Wound Check   80 year old female presents for suture removal.  DOI 11/10/13. Doing well without any issues or concerns. No erythema, warmth, or drainage.      Review of Systems  Skin: Positive for wound. Negative for color change.  Neurological: Negative for dizziness and headaches.       Objective:   Physical Exam  Constitutional: She is oriented to person, place, and time.  HENT:  Head: Normocephalic and atraumatic.  Neurological: She is alert and oriented to person, place, and time.  Skin:     Psychiatric: She has a normal mood and affect. Her behavior is normal. Judgment and thought content normal.    #2 sutures removed without difficulty.       Assessment & Plan:  Wound, open, face  Sutures removed  Wound well healing Follow up as needed

## 2014-04-23 ENCOUNTER — Encounter: Payer: Self-pay | Admitting: Cardiology

## 2014-05-06 ENCOUNTER — Other Ambulatory Visit: Payer: Self-pay | Admitting: Internal Medicine

## 2014-05-06 DIAGNOSIS — W19XXXA Unspecified fall, initial encounter: Secondary | ICD-10-CM

## 2014-05-11 ENCOUNTER — Ambulatory Visit
Admission: RE | Admit: 2014-05-11 | Discharge: 2014-05-11 | Disposition: A | Discharge status: Home / Self Care | Payer: Medicare Other | Source: Ambulatory Visit | Attending: Internal Medicine | Admitting: Internal Medicine

## 2014-05-11 DIAGNOSIS — W19XXXA Unspecified fall, initial encounter: Secondary | ICD-10-CM

## 2014-05-11 MED ORDER — GADOBENATE DIMEGLUMINE 529 MG/ML IV SOLN
17.0000 mL | Freq: Once | INTRAVENOUS | Status: AC | PRN
  Administered 2014-05-11: 17 mL via INTRAVENOUS

## 2014-05-23 ENCOUNTER — Ambulatory Visit
Admission: RE | Admit: 2014-05-23 | Discharge: 2014-05-23 | Disposition: A | Discharge status: Home / Self Care | Payer: Medicare Other | Source: Ambulatory Visit | Attending: Internal Medicine | Admitting: Internal Medicine

## 2014-05-23 ENCOUNTER — Other Ambulatory Visit: Payer: Self-pay | Admitting: Internal Medicine

## 2014-05-23 DIAGNOSIS — M25562 Pain in left knee: Secondary | ICD-10-CM

## 2014-05-23 DIAGNOSIS — M25561 Pain in right knee: Secondary | ICD-10-CM

## 2014-06-03 ENCOUNTER — Encounter: Payer: Self-pay | Admitting: Physical Therapy

## 2014-06-03 ENCOUNTER — Ambulatory Visit: Payer: Medicare Other | Attending: Internal Medicine | Admitting: Physical Therapy

## 2014-06-03 DIAGNOSIS — R269 Unspecified abnormalities of gait and mobility: Secondary | ICD-10-CM | POA: Insufficient documentation

## 2014-06-03 DIAGNOSIS — R6889 Other general symptoms and signs: Secondary | ICD-10-CM | POA: Diagnosis not present

## 2014-06-03 NOTE — Therapy (Signed)
[facility] [address] [place], [place], [address] Phone: [phone]   Fax:  [phone]  Physical Therapy Evaluation  Patient Details  Name: Autumn Allen MRN: [id number] Date of Birth: [birth year]  Encounter Date: 06/03/2014      PT End of Session - 06/03/14 1658    Visit Number 1   Number of Visits 17   Date for PT Re-Evaluation 08/02/14   PT Start Time 1315   PT Stop Time 1400   PT Time Calculation (min) 45 min   Equipment Utilized During Treatment Gait belt   Activity Tolerance Patient tolerated treatment well   Behavior During Therapy [facility] for tasks assessed/performed      Past Medical History  Diagnosis Date  . Hypertension   . Hypothyroid   . Hyperlipemia   . Sleep apnea   . Depression   . Chronic cystitis   . Plantar fasciitis   . S/P TAH-BSO   . S/P cholecystectomy   . Hx of cardiovascular stress test     a. ETT-Echo 3/12:  EF 60%, normal study  . OSA on CPAP   . Hx of echocardiogram     a. Echo 2/14:  Mild LVH, EF 60-65%, Gr 1 diast dysfn, mild to mod AS, mean 17 mmHg, AVA 1.3 (VTI), trivial MR, mild LAE, PASP 31   . Aortic stenosis     a. mild to mod by Echo 07/2012  . Palpitations     a. event monitor 3/14:  NSR, sinus brady  . Allergy   . Arthritis     Past Surgical History  Procedure Laterality Date  . Cholecystectomy    . Abdominal hysterectomy    . Incontinence surgery      There were no vitals taken for this visit.  Visit Diagnosis:  Abnormality of gait - Plan: PT plan of care cert/re-cert  Activity intolerance - Plan: PT plan of care cert/re-cert      Subjective Assessment - 06/03/14 1328    Symptoms This 80yo female presents to PT evaluation due to increased number of falls. (stitches in forehead, black eye, contusions)   Patient Stated Goals To get around better without falling.   Currently in Pain? No/denies          [facility] - 06/03/14 1315    Assessment   Medical Diagnosis Gait Abnormality   Onset Date --  balance issues started ~1 year ago   Precautions   Precautions Fall   Restrictions   Weight Bearing Restrictions No   Balance Screen   Has the patient fallen in the past 6 months Yes   How many times? 4-5   Has the patient had a decrease in activity level because of a fear of falling?  No   Is the patient reluctant to leave their home because of a fear of falling?  No   Home Environment   Living Enviornment --  independent living in [facility]   Additional Comments ~100yd car to elevator, 100' elevator to apt. ~200yd to dining room or pool    Prior Function   Level of Independence Independent with basic ADLs;Independent with homemaking with ambulation;Independent with gait;Independent with transfers   Vocation Retired   [employer]   Overall Cognitive Status Within Functional Limits for tasks assessed   Observation/Other Assessments   Focus on Therapeutic Outcomes (FOTO)  66  Functional Status   Activities of Balance Confidence Scale (ABC Scale)  40.0%  Fear Avoidance Belief Questionnaire (FABQ)  19   Strength   Right Hip Flexion 5/5   Right Hip Extension 4/5   Right Hip ABduction 4/5   Left Hip Flexion 4/5   Left Hip Extension 3/5   Left Hip ABduction 3+/5   Right Knee Flexion 5/5   Right Knee Extension 5/5   Left Knee Flexion 4/5   Left Knee Extension 5/5   Right Ankle Dorsiflexion 5/5   Left Ankle Dorsiflexion 5/5   Ambulation/Gait   Ambulation/Gait Yes   Ambulation/Gait Assistance 5: Supervision   Ambulation Distance (Feet) 300 Feet   Assistive device None   Gait Pattern Step-through pattern;Decreased step length - right;Decreased stance time - left;Decreased stride length;Decreased hip/knee flexion - left;Trunk flexed   Gait velocity 3.41 ft/sec   Stairs Yes   Stairs Assistance 5: Supervision   Stair Management Technique Two rails;Alternating pattern;Forwards   Number of  Stairs 4   Ramp 5: Supervision  no device   Curb 5: Supervision  no device   Berg Balance Test   Sit to Stand Able to stand without using hands and stabilize independently   Standing Unsupported Able to stand safely 2 minutes   Sitting with Back Unsupported but Feet Supported on Floor or Stool Able to sit safely and securely 2 minutes   Stand to Sit Sits safely with minimal use of hands   Transfers Able to transfer safely, minor use of hands   Standing Unsupported with Eyes Closed Able to stand 10 seconds with supervision   Standing Ubsupported with Feet Together Able to place feet together independently and stand 1 minute safely   From Standing, Reach Forward with Outstretched Arm Can reach forward >12 cm safely (5")   From Standing Position, Pick up Object from Floor Able to pick up shoe, needs supervision   From Standing Position, Turn to Look Behind Over each Shoulder Looks behind from both sides and weight shifts well   Turn 360 Degrees Able to turn 360 degrees safely but slowly   Standing Unsupported, Alternately Place Feet on Step/Stool Able to complete >2 steps/needs minimal assist   Standing Unsupported, One Foot in Front Able to take small step independently and hold 30 seconds   Standing on One Leg Tries to lift leg/unable to hold 3 seconds but remains standing independently   Total Score 43   Dynamic Gait Index   Level Surface Normal   Change in Gait Speed Mild Impairment   Gait with Horizontal Head Turns Mild Impairment   Gait with Vertical Head Turns Mild Impairment   Gait and Pivot Turn Mild Impairment   Step Over Obstacle Mild Impairment   Step Around Obstacles Normal   Steps Mild Impairment   Total Score 18   Timed Up and Go Test   Normal TUG (seconds) 10.41                          PT Education - 06/03/14 1658    Education provided No          PT Short Term Goals - 06/03/14 1705    PT SHORT TERM GOAL #1   Title demonstrates initial HEP  correctly. (Target Date: 07/03/14)   Time 4   Period Weeks   Status New   PT SHORT TERM GOAL #2   Title Berg Balance >46/56  (Target Date: 07/03/14)   Time 4   Period Weeks   Status New   PT SHORT  TERM GOAL #3   Title ambulates with head turns to scan & negotiate around obstacles without balance loss modified independent.  (Target Date: 07/03/14)   Time 4   Period Weeks   Status New           PT Long Term Goals - 06/03/14 1707    PT LONG TERM GOAL #1   Title demonstrates understanding of ongoing / progressive HEP  (Target Date: 08/02/14)   Time 8   Period Weeks   Status New   PT LONG TERM GOAL #2   Title Berg Balance >/= 52/56 (Target Date: 08/02/14)   Time 8   Period Weeks   Status New   PT LONG TERM GOAL #3   Title Dynamic Gait Index >/= 19/24 (Target Date: 08/02/14)   Time 8   Period Weeks   Status New   PT LONG TERM GOAL #4   Title ambulates >500' and negotiate ramp /curb without device modified independent. (Target Date: 08/02/14)   Time 8   Period Weeks   Status New               Plan - 06/03/14 1315    Clinical Impression Statement This 80yo female reports decline in her mobility over the last year with recurrent falls. She has had contusions and stitches. PT testing indicates fall risk with Berg Balance 43/56, Dynamic Gait Index 18/24, and weakness in lower extremities.   Pt will benefit from skilled therapeutic intervention in order to improve on the following deficits Abnormal gait;Decreased activity tolerance;Decreased balance;Decreased mobility;Decreased strength   Rehab Potential Good   PT Frequency 2x / week   PT Duration 2 weeks   PT Treatment/Interventions ADLs/Self Care Home Management;Gait training;Stair training;Functional mobility training;Therapeutic activities;Therapeutic exercise;Balance training;Neuromuscular re-education;Patient/family education   PT Next Visit Plan HEP for balance & strength   PT Home Exercise Plan balance & strength    Consulted and Agree with Plan of Care Patient          G-Codes - 06/03/14 1315    Functional Assessment Tool Used Berg Balance 43/56, Dynamic Gait Index 18/24   Functional Limitation Mobility: Walking and moving around   Mobility: Walking and Moving Around Current Status ([id number]) At least 40 percent but less than 60 percent impaired, limited or restricted   Mobility: Walking and Moving Around Goal Status [phone]([id number]) At least 1 percent but less than 20 percent impaired, limited or restricted       Problem List Patient Active Problem List   Diagnosis Date Noted  . MURMUR 08/14/2010  . CHEST PAIN 08/14/2010   Vladimir Fasterobin Mikolaj Woolstenhulme, PT, DPT PT Specializing in Prosthetics & Orthotics 06/03/2014 5:17 PM Phone:  [phone]  Fax:  [phone] [facility] [address] [place], [place] [address]   Vladimir FasterWALDRON,Baillie Mohammad 06/03/2014, 5:17 PM  [facility] [address] [place], [place], [address] Phone: [phone]   Fax:  [phone]

## 2014-06-10 ENCOUNTER — Encounter: Payer: Self-pay | Admitting: Neurology

## 2014-06-10 ENCOUNTER — Ambulatory Visit (INDEPENDENT_AMBULATORY_CARE_PROVIDER_SITE_OTHER): Payer: Medicare Other | Admitting: Neurology

## 2014-06-10 VITALS — BP 155/66 | HR 56 | Ht 65.0 in | Wt 187.4 lb

## 2014-06-10 DIAGNOSIS — E538 Deficiency of other specified B group vitamins: Secondary | ICD-10-CM

## 2014-06-10 DIAGNOSIS — R269 Unspecified abnormalities of gait and mobility: Secondary | ICD-10-CM

## 2014-06-10 HISTORY — DX: Unspecified abnormalities of gait and mobility: R26.9

## 2014-06-10 NOTE — Patient Instructions (Signed)

## 2014-06-10 NOTE — Progress Notes (Signed)
Reason for visit: Gait disorder  Autumn Allen is a 81 y.o. female  History of present illness:  Autumn Allen is an 80 year old right-handed white female with a history of some issues with walking that she indicates has been present for about 2 years. The patient has a sensation of imbalance when she is walking, she reports no dizziness. The patient began using a cane about one month ago. The patient had a fall about 6 weeks ago. The patient indicates that she has a sensation that the legs are weak bilaterally, right greater than left. She has some tingling in the bottom of the feet that she relates to a plantar fasciitis problem. She denies any numbness or weakness of the arms, she denies any problems controlling the bowels or the bladder. The patient denies any burning or stinging of the feet at night. She has not reported any troubles with memory or concentration. She currently is engaged in physical therapy for gait training. MRI of the brain was done recently showing some mild small vessel disease. This was reviewed online. The radiographic interpretation included multiple sclerosis in the differential diagnosis. The patient is sent over to this office for an evaluation. She has recently had a thyroid study.  Past Medical History  Diagnosis Date  . Hypertension   . Hypothyroid   . Hyperlipemia   . Sleep apnea   . Depression   . Chronic cystitis   . Plantar fasciitis   . S/P TAH-BSO   . S/P cholecystectomy   . Hx of cardiovascular stress test     a. ETT-Echo 3/12:  EF 60%, normal study  . OSA on CPAP   . Hx of echocardiogram     a. Echo 2/14:  Mild LVH, EF 60-65%, Gr 1 diast dysfn, mild to mod AS, mean 17 mmHg, AVA 1.3 (VTI), trivial MR, mild LAE, PASP 31   . Aortic stenosis     a. mild to mod by Echo 07/2012  . Palpitations     a. event monitor 3/14:  NSR, sinus brady  . Allergy   . Arthritis   . Gait disturbance 06/10/2014    Past Surgical History  Procedure  Laterality Date  . Cholecystectomy    . Abdominal hysterectomy    . Incontinence surgery    . Tonsillectomy      Family History  Problem Relation Age of Onset  . Heart attack Mother 24  . Heart failure Mother   . Lung disease Father   . Cancer - Prostate Father   . Healthy Brother   . Healthy Brother     Social history:  reports that she has quit smoking. She has never used smokeless tobacco. She reports that she does not drink alcohol or use illicit drugs.  Medications:  Current Outpatient Prescriptions on File Prior to Visit  Medication Sig Dispense Refill  . aspirin EC 81 MG tablet Take 81 mg by mouth daily.    Marland Kitchen BIOTIN PO Take 1 capsule by mouth daily.    . Ergocalciferol (VITAMIN D2 PO) Take 1 capsule by mouth daily.    . felodipine (PLENDIL) 5 MG 24 hr tablet Take 5 mg by mouth daily.    . indapamide (LOZOL) 2.5 MG tablet Take 2.5 mg by mouth daily.    Marland Kitchen ketoconazole (NIZORAL) 2 % shampoo Apply 1 application topically as needed.     Marland Kitchen levothyroxine (SYNTHROID, LEVOTHROID) 50 MCG tablet Take 50 mcg by mouth daily.    Marland Kitchen  nystatin-triamcinolone (MYCOLOG II) cream Apply 1 application topically as needed.      No current facility-administered medications on file prior to visit.      Allergies  Allergen Reactions  . Metronidazole And Related     unknown  . Ciprofloxacin Other (See Comments)    Reaction unknown  . Etodolac Other (See Comments)    Reaction unknown  . Naproxen Other (See Comments)    Reaction unknown  . Nitrofurantoin Other (See Comments)    Reaction unknown  . Sulfonamide Derivatives Other (See Comments)    Reaction unknown  . Alprazolam Rash  . Epinephrine Palpitations  . Macrobid [Nitrofurantoin Macrocrystal] Rash    ROS:  Out of a complete 14 system review of symptoms, the patient complains only of the following symptoms, and all other reviewed systems are negative.  Weight gain Swelling in the legs Ringing in the  ears Snoring Incontinence Easy bruising Achy muscles Allergies Restless legs, sleep apnea  Blood pressure 155/66, pulse 56, height 5\' 5"  (1.651 m), weight 187 lb 6.4 oz (85.004 kg).  Physical Exam  General: The patient is alert and cooperative at the time of the examination.  Eyes: Pupils are equal, round, and reactive to light. Discs are flat bilaterally.  Neck: The neck is supple, no carotid bruits are noted.  Respiratory: The respiratory examination is clear.  Cardiovascular: The cardiovascular examination reveals a regular rate and rhythm, with a grade I/VI systolic murmur at the aortic area.  Skin: Extremities are without significant edema.  Neurologic Exam  Mental status: The patient is alert and oriented x 3 at the time of the examination. The patient has apparent normal recent and remote memory, with an apparently normal attention span and concentration ability.  Cranial nerves: Facial symmetry is present. There is good sensation of the face to pinprick and soft touch bilaterally. The strength of the facial muscles and the muscles to head turning and shoulder shrug are normal bilaterally. Speech is well enunciated, no aphasia or dysarthria is noted. Extraocular movements are full. Visual fields are full. The tongue is midline, and the patient has symmetric elevation of the soft palate. No obvious hearing deficits are noted.  Motor: The motor testing reveals 5 over 5 strength of all 4 extremities. Good symmetric motor tone is noted throughout.  Sensory: Sensory testing is intact to pinprick, soft touch, vibration sensation, and position sense on all 4 extremities, with exception that there is some stocking pattern pinprick sensory deficit across the left ankle, some decrease in position sense with the left foot. No evidence of extinction is noted.  Coordination: Cerebellar testing reveals good finger-nose-finger and heel-to-shin bilaterally.  Gait and station: Gait is  normal. Tandem gait is slightly unsteady. Romberg is negative. No drift is seen.  Reflexes: Deep tendon reflexes are symmetric and normal bilaterally. The ankle jerk reflexes are well-maintained bilaterally. Toes are downgoing bilaterally.   MRI brain 05/11/14:  IMPRESSION: Negative for acute infarct.  Periventricular white matter hyperintensities bilaterally. The pattern is suggestive of multiple sclerosis however a patient of this age is more likely to have chronic microvascular ischemia.    Assessment/Plan:  1. Mild gait disturbance  2. Abnormal MRI brain, small vessel disease  The patient likely has mild small vessel disease, it is unlikely that she has demyelinating disease that would be active in her 80s. The patient will be sent for some blood work today, she will undergo physical therapy, I will follow-up in 4 months to reevaluate her walking. The  patient has a minimal balance issue. Clinical examination does not show other findings such as evidence of a peripheral neuropathy.  Marlan Palau. Keith Willis MD 06/10/2014 7:11 PM  [facility] [address] [place], [place] [address]  Phone [phone] Fax [phone]

## 2014-06-11 LAB — SPECIMEN STATUS REPORT

## 2014-06-12 ENCOUNTER — Telehealth: Payer: Self-pay | Admitting: Neurology

## 2014-06-14 LAB — CK: CK TOTAL: 69 U/L (ref 24–173)

## 2014-06-14 LAB — RPR: RPR: NONREACTIVE

## 2014-06-14 LAB — VITAMIN B12: Vitamin B-12: 299 pg/mL (ref 211–946)

## 2014-06-14 LAB — COPPER, SERUM: COPPER: 111 ug/dL (ref 72–166)

## 2014-06-18 ENCOUNTER — Encounter: Payer: Self-pay | Admitting: Physical Therapy

## 2014-06-18 ENCOUNTER — Ambulatory Visit: Payer: Medicare Other | Attending: Internal Medicine | Admitting: Physical Therapy

## 2014-06-18 DIAGNOSIS — R6889 Other general symptoms and signs: Secondary | ICD-10-CM | POA: Diagnosis not present

## 2014-06-18 DIAGNOSIS — R269 Unspecified abnormalities of gait and mobility: Secondary | ICD-10-CM | POA: Diagnosis not present

## 2014-06-18 NOTE — Therapy (Signed)
[facility] [address] [place], [place], [address] Phone: [phone]   Fax:  [phone]  Physical Therapy Treatment  Patient Details  Name: Autumn Allen MRN: [id number] Date of Birth: October 12, 1929  Encounter Date: 06/18/2014      PT End of Session - 06/18/14 1432    Visit Number 2   Number of Visits 17   Date for PT Re-Evaluation 08/02/14   PT Start Time 1335  pt 20 minutes late due to traffic/got lost   PT Stop Time 1400   PT Time Calculation (min) 25 min   Equipment Utilized During Treatment Gait belt   Activity Tolerance Patient tolerated treatment well   Behavior During Therapy [facility] for tasks assessed/performed      Past Medical History  Diagnosis Date  . Hypertension   . Hypothyroid   . Hyperlipemia   . Sleep apnea   . Depression   . Chronic cystitis   . Plantar fasciitis   . S/P TAH-BSO   . S/P cholecystectomy   . Hx of cardiovascular stress test     a. ETT-Echo 3/12:  EF 60%, normal study  . OSA on CPAP   . Hx of echocardiogram     a. Echo 2/14:  Mild LVH, EF 60-65%, Gr 1 diast dysfn, mild to mod AS, mean 17 mmHg, AVA 1.3 (VTI), trivial MR, mild LAE, PASP 31   . Aortic stenosis     a. mild to mod by Echo 07/2012  . Palpitations     a. event monitor 3/14:  NSR, sinus brady  . Allergy   . Arthritis   . Gait disturbance 06/10/2014    Past Surgical History  Procedure Laterality Date  . Cholecystectomy    . Abdominal hysterectomy    . Incontinence surgery    . Tonsillectomy      There were no vitals taken for this visit.  Visit Diagnosis:  Abnormality of gait  Activity intolerance      Subjective Assessment - 06/18/14 1336    Symptoms No new compliants. No falls or pain to report.   Currently in Pain? No/denies           [facility] Adult PT Treatment/Exercise - 06/18/14 1430    High Level Balance   High Level Balance Activities Side stepping;Braiding;Tandem walking;Figure 8  turns  heel walk, toe walk   High Level Balance Comments at counter for support as needed. Performed forward/backwards as able. 3 laps each/each way. Issued as HEP.                                    Knee/Hip Exercises: Aerobic   Stationary Bike Nustep x 4 extremeties Level 2 x 8 minutes with goal >/= 50 steps/minute for strengthening and activity tolerance.           PT Education - 06/18/14 1432    Education provided Yes   Education Details HEP for balance   Person(s) Educated Patient   Methods Explanation;Demonstration;Handout   Comprehension Verbalized understanding;Returned demonstration          PT Short Term Goals - 06/03/14 1705    PT SHORT TERM GOAL #1   Title demonstrates initial HEP correctly. (Target Date: 07/03/14)   Time 4   Period Weeks   Status New   PT SHORT TERM GOAL #2   Title Berg Balance >46/56  (Target Date: 07/03/14)  Time 4   Period Weeks   Status New   PT SHORT TERM GOAL #3   Title ambulates with head turns to scan & negotiate around obstacles without balance loss modified independent.  (Target Date: 07/03/14)   Time 4   Period Weeks   Status New           PT Long Term Goals - 06/03/14 1707    PT LONG TERM GOAL #1   Title demonstrates understanding of ongoing / progressive HEP  (Target Date: 08/02/14)   Time 8   Period Weeks   Status New   PT LONG TERM GOAL #2   Title Berg Balance >/= 52/56 (Target Date: 08/02/14)   Time 8   Period Weeks   Status New   PT LONG TERM GOAL #3   Title Dynamic Gait Index >/= 19/24 (Target Date: 08/02/14)   Time 8   Period Weeks   Status New   PT LONG TERM GOAL #4   Title ambulates >500' and negotiate ramp /curb without device modified independent. (Target Date: 08/02/14)   Time 8   Period Weeks   Status New      Problem List Patient Active Problem List   Diagnosis Date Noted  . Gait disturbance 06/10/2014  . MURMUR 08/14/2010  . CHEST PAIN 08/14/2010    Sallyanne Kuster 06/18/2014, 2:34 PM  Sallyanne Kuster, PTA, [facility] [address], [address] [place], [place] [phone] 06/18/2014, 2:34 PM

## 2014-06-18 NOTE — Patient Instructions (Signed)
Side-Stepping   Walk to left side with eyes open. Take even steps, leading with same foot. Make sure each foot lifts off the floor. Repeat in opposite direction. Repeat for 3 laps each way. Do _1-2___ sessions per day.  Copyright  VHI. All rights reserved.  Walking on Heels   At counter: Walk on heels forward then backwards in a straight line. 3 laps each way. Do __1-2_ sessions per day.  Copyright  VHI. All rights reserved.  Walking on Toes   At counter: Walk on toes forward then backwards.  3 laps each way. Do __1-2__ sessions per day.  Copyright  VHI. All rights reserved.  Feet Heel-Toe "Tandem"   At counter with arms at sides/on counter, walk a straight line forward and then backwards. 3 laps each way.  Do __1-2_ sessions per day.  Copyright  VHI. All rights reserved.  Figure Eight   Walk in a figure eight pattern. Repeat _3-4___ times per session. Do __1-2__ sessions per day.   Braiding   Move to side: cross feet in following pattern: front cross, step out, back cross, step out until at end of counter. Repeat the pattern the other way, front, step, back, step. Repeat sequence 3 laps each way. Do _1-2_ sessions per day.

## 2014-06-20 ENCOUNTER — Encounter: Payer: Self-pay | Admitting: Physical Therapy

## 2014-06-20 ENCOUNTER — Ambulatory Visit: Payer: Medicare Other | Admitting: Physical Therapy

## 2014-06-20 DIAGNOSIS — R269 Unspecified abnormalities of gait and mobility: Secondary | ICD-10-CM

## 2014-06-20 DIAGNOSIS — R6889 Other general symptoms and signs: Secondary | ICD-10-CM

## 2014-06-20 NOTE — Therapy (Signed)
[facility] [address] [place], [place], [address] Phone: [phone]   Fax:  [phone]  Physical Therapy Treatment  Patient Details  Name: Autumn Allen MRN: [id number] Date of Birth: 1929/06/30 Referring Shaka Cardin:  Minda Meo, MD  Encounter Date: 06/20/2014      PT End of Session - 06/20/14 1323    Visit Number 3   Number of Visits 17   Date for PT Re-Evaluation 08/02/14   PT Start Time 1318   PT Stop Time 1356   PT Time Calculation (min) 38 min   Equipment Utilized During Treatment Gait belt   Activity Tolerance Patient tolerated treatment well   Behavior During Therapy [facility] for tasks assessed/performed      Past Medical History  Diagnosis Date  . Hypertension   . Hypothyroid   . Hyperlipemia   . Sleep apnea   . Depression   . Chronic cystitis   . Plantar fasciitis   . S/P TAH-BSO   . S/P cholecystectomy   . Hx of cardiovascular stress test     a. ETT-Echo 3/12:  EF 60%, normal study  . OSA on CPAP   . Hx of echocardiogram     a. Echo 2/14:  Mild LVH, EF 60-65%, Gr 1 diast dysfn, mild to mod AS, mean 17 mmHg, AVA 1.3 (VTI), trivial MR, mild LAE, PASP 31   . Aortic stenosis     a. mild to mod by Echo 07/2012  . Palpitations     a. event monitor 3/14:  NSR, sinus brady  . Allergy   . Arthritis   . Gait disturbance 06/10/2014    Past Surgical History  Procedure Laterality Date  . Cholecystectomy    . Abdominal hysterectomy    . Incontinence surgery    . Tonsillectomy      There were no vitals taken for this visit.  Visit Diagnosis:  Abnormality of gait  Activity intolerance      Subjective Assessment - 06/20/14 1323    Symptoms No new compliants. No falls or pain to report. Doing HEP without issues or questions.   Currently in Pain? No/denies          [facility] Adult PT Treatment/Exercise - 06/20/14 1324    Dynamic Standing Balance   Dynamic Standing - Balance Support  No upper extremity supported;During functional activity   Dynamic Standing - Level of Assistance 4: Min assist   Dynamic Standing - Balance Activities Alternating  foot traps;Compliant surfaces;Other (comment)   Dynamic Standing - Comments on blue mat with tall cones: alternating forward toe taps, cross toe taps, forward double toe taps, and cross double toe taps x 10 each. alternating flip over/up x 10 each leg. with 6 cones in a row: bil foot taps with side stepping x 4 laps. all these needed up to min assist for balance and cues on ex form and techique.                              High Level Balance   High Level Balance Activities Marching forwards;Marching backwards;Tandem walking  tandem, heel walk, toe walk all forward/backward as well   High Level Balance Comments on blue/red mats at counter: 3 laps each/each way with up to min assist for balance and cues on posture and ex technique.  Knee/Hip Exercises: Aerobic   Stationary Bike Scifit x4 extremeties level 2.0 x 8 minutes with goal RPM >/= 70-75 for strengthening and activity tolerance. Pt needed moderate cues for posture and pacing with this activity.                            PT Short Term Goals - 06/03/14 1705    PT SHORT TERM GOAL #1   Title demonstrates initial HEP correctly. (Target Date: 07/03/14)   Time 4   Period Weeks   Status New   PT SHORT TERM GOAL #2   Title Berg Balance >46/56  (Target Date: 07/03/14)   Time 4   Period Weeks   Status New   PT SHORT TERM GOAL #3   Title ambulates with head turns to scan & negotiate around obstacles without balance loss modified independent.  (Target Date: 07/03/14)   Time 4   Period Weeks   Status New           PT Long Term Goals - 06/03/14 1707    PT LONG TERM GOAL #1   Title demonstrates understanding of ongoing / progressive HEP  (Target Date: 08/02/14)   Time 8   Period Weeks   Status New   PT LONG TERM GOAL #2   Title Berg  Balance >/= 52/56 (Target Date: 08/02/14)   Time 8   Period Weeks   Status New   PT LONG TERM GOAL #3   Title Dynamic Gait Index >/= 19/24 (Target Date: 08/02/14)   Time 8   Period Weeks   Status New   PT LONG TERM GOAL #4   Title ambulates >500' and negotiate ramp /curb without device modified independent. (Target Date: 08/02/14)   Time 8   Period Weeks   Status New           Plan - 06/20/14 1323    Clinical Impression Statement Pt making steady progress with balance and mobility toward her goals.   Pt will benefit from skilled therapeutic intervention in order to improve on the following deficits Abnormal gait;Decreased activity tolerance;Decreased balance;Decreased mobility;Decreased strength   Rehab Potential Good   PT Frequency 2x / week   PT Duration 2 weeks   PT Treatment/Interventions ADLs/Self Care Home Management;Gait training;Stair training;Functional mobility training;Therapeutic activities;Therapeutic exercise;Balance training;Neuromuscular re-education;Patient/family education   PT Next Visit Plan HEP for strength. Continue with balance activities.   PT Home Exercise Plan balance & strength   Consulted and Agree with Plan of Care Patient      Problem List Patient Active Problem List   Diagnosis Date Noted  . Gait disturbance 06/10/2014  . MURMUR 08/14/2010  . CHEST PAIN 08/14/2010    Sallyanne KusterBury, Kathy 06/20/2014, 2:57 PM  Sallyanne KusterKathy Bury, PTA, [facility] [address], [address] [place], [place] [address] [phone] 06/20/2014, 2:57 PM

## 2014-06-25 ENCOUNTER — Encounter: Payer: Self-pay | Admitting: Physical Therapy

## 2014-06-25 ENCOUNTER — Ambulatory Visit: Payer: Medicare Other | Admitting: Physical Therapy

## 2014-06-25 DIAGNOSIS — R6889 Other general symptoms and signs: Secondary | ICD-10-CM

## 2014-06-25 DIAGNOSIS — R269 Unspecified abnormalities of gait and mobility: Secondary | ICD-10-CM

## 2014-06-25 NOTE — Patient Instructions (Signed)
Chair Sitting   Sit at edge of seat, spine straight, one leg extended. Put a hand on each thigh and bend forward from the hip, keeping spine straight. Allow hand on extended leg to reach toward toes. Support upper body with other arm. Hold 5 deep breaths. Repeat _2-3__ times on each leg per session. Do __1-2_ sessions per day.  Copyright  VHI. All rights reserved.  Gastroc / Heel Cord Stretch - Seated With Towel   Sit with leg straight as previous exercise, towel around ball of foot. Gently pull foot in toward body, stretching heel cord and calf, then lean trunk forward as hamstring stretch. Hold for 5 deep breaths. Repeat on other leg. Repeat _2-3__ times on each leg. Do __1-2_ times per day.  Copyright  VHI. All rights reserved.  TRUNK: Rotation   Sit with upright posture. Rotate body until gentle resistance is felt. Look that way to help stretch.  Hold 5 deep breaths. __3_ reps each way,  __1-2 times per_ day.  Copyright  VHI. All rights reserved.

## 2014-06-25 NOTE — Therapy (Signed)
[facility] [address] [place], [place], [address] Phone: [phone]   Fax:  [phone]  Physical Therapy Treatment  Patient Details  Name: Autumn Allen MRN: [id number] Date of Birth: 02-09-1930 Referring Provider:  Minda Meo, MD  Encounter Date: 06/25/2014      PT End of Session - 06/25/14 1400    Visit Number 4   Number of Visits 17   Date for PT Re-Evaluation 08/02/14   PT Start Time 1445   PT Stop Time 1530   PT Time Calculation (min) 45 min   Equipment Utilized During Treatment Gait belt   Activity Tolerance Patient tolerated treatment well   Behavior During Therapy [facility] for tasks assessed/performed      Past Medical History  Diagnosis Date  . Hypertension   . Hypothyroid   . Hyperlipemia   . Sleep apnea   . Depression   . Chronic cystitis   . Plantar fasciitis   . S/P TAH-BSO   . S/P cholecystectomy   . Hx of cardiovascular stress test     a. ETT-Echo 3/12:  EF 60%, normal study  . OSA on CPAP   . Hx of echocardiogram     a. Echo 2/14:  Mild LVH, EF 60-65%, Gr 1 diast dysfn, mild to mod AS, mean 17 mmHg, AVA 1.3 (VTI), trivial MR, mild LAE, PASP 31   . Aortic stenosis     a. mild to mod by Echo 07/2012  . Palpitations     a. event monitor 3/14:  NSR, sinus brady  . Allergy   . Arthritis   . Gait disturbance 06/10/2014    Past Surgical History  Procedure Laterality Date  . Cholecystectomy    . Abdominal hysterectomy    . Incontinence surgery    . Tonsillectomy      There were no vitals taken for this visit.  Visit Diagnosis:  Abnormality of gait  Activity intolerance      Subjective Assessment - 06/25/14 1451    Symptoms No falls nor issues. Reports doing exercises all but one day.   Currently in Pain? No/denies                    [facility] Adult PT Treatment/Exercise - 06/25/14 1445    Ambulation/Gait   Ambulation/Gait Yes   Ambulation/Gait  Assistance 5: Supervision   Ambulation/Gait Assistance Details head turns while ambulating maintaining path & speed   Ambulation Distance (Feet) 150 Feet   Assistive device None   Gait Pattern Step-through pattern   Ambulation Surface Level;Indoor   Dynamic Standing Balance   Dynamic Standing - Balance Support No upper extremity supported   Dynamic Standing - Level of Assistance 4: Min assist   Dynamic Standing - Balance Activities Rocker board;Compliant surfaces;Eyes open;Eyes closed;Head turns;Head nods;Alternating  foot traps   Dynamic Standing - Comments tactile cues on righting reactions                PT Education - 06/25/14 1838    Education provided Yes   Education Details see pt instructions   Person(s) Educated Patient   Methods Explanation;Demonstration;Handout   Comprehension Verbalized understanding;Returned demonstration          PT Short Term Goals - 06/03/14 1705    PT SHORT TERM GOAL #1   Title demonstrates initial HEP correctly. (Target Date: 07/03/14)   Time 4   Period Weeks   Status New   PT SHORT TERM GOAL #  2   Title Berg Balance >46/56  (Target Date: 07/03/14)   Time 4   Period Weeks   Status New   PT SHORT TERM GOAL #3   Title ambulates with head turns to scan & negotiate around obstacles without balance loss modified independent.  (Target Date: 07/03/14)   Time 4   Period Weeks   Status New           PT Long Term Goals - 06/03/14 1707    PT LONG TERM GOAL #1   Title demonstrates understanding of ongoing / progressive HEP  (Target Date: 08/02/14)   Time 8   Period Weeks   Status New   PT LONG TERM GOAL #2   Title Berg Balance >/= 52/56 (Target Date: 08/02/14)   Time 8   Period Weeks   Status New   PT LONG TERM GOAL #3   Title Dynamic Gait Index >/= 19/24 (Target Date: 08/02/14)   Time 8   Period Weeks   Status New   PT LONG TERM GOAL #4   Title ambulates >500' and negotiate ramp /curb without device modified independent. (Target  Date: 08/02/14)   Time 8   Period Weeks   Status New               Plan - 06/25/14 1445    Clinical Impression Statement Patient improved balance with tactile cues and repetition. Patient had difficulty maintaining path with head turns.   Pt will benefit from skilled therapeutic intervention in order to improve on the following deficits Abnormal gait;Decreased activity tolerance;Decreased balance;Decreased mobility;Decreased strength   Rehab Potential Good   PT Frequency 2x / week   PT Duration 2 weeks   PT Treatment/Interventions ADLs/Self Care Home Management;Gait training;Stair training;Functional mobility training;Therapeutic activities;Therapeutic exercise;Balance training;Neuromuscular re-education;Patient/family education   PT Next Visit Plan HEP for strength. Continue with balance activities.   PT Home Exercise Plan balance & strength   Consulted and Agree with Plan of Care Patient        Problem List Patient Active Problem List   Diagnosis Date Noted  . Gait disturbance 06/10/2014  . MURMUR 08/14/2010  . CHEST PAIN 08/14/2010    Vladimir FasterWALDRON,Seth Higginbotham PT, DPT 06/25/2014, 6:42 PM  [facility] [address] [place], [place], [address] Phone: [phone]   Fax:  [phone]

## 2014-06-27 ENCOUNTER — Ambulatory Visit: Payer: Medicare Other | Admitting: Physical Therapy

## 2014-06-27 ENCOUNTER — Encounter: Payer: Self-pay | Admitting: Physical Therapy

## 2014-06-27 DIAGNOSIS — R269 Unspecified abnormalities of gait and mobility: Secondary | ICD-10-CM

## 2014-06-27 DIAGNOSIS — R6889 Other general symptoms and signs: Secondary | ICD-10-CM
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
## 2014-07-02 ENCOUNTER — Ambulatory Visit: Payer: Medicare Other | Admitting: Physical Therapy

## 2014-07-02 ENCOUNTER — Encounter: Payer: Self-pay | Admitting: Physical Therapy

## 2014-07-02 DIAGNOSIS — R269 Unspecified abnormalities of gait and mobility: Secondary | ICD-10-CM | POA: Diagnosis not present

## 2014-07-02 DIAGNOSIS — R6889 Other general symptoms and signs: Secondary | ICD-10-CM

## 2014-07-02 NOTE — Therapy (Signed)
[place] [address] [place], [place], [address] Phone: [phone]   Fax:  [phone]  Physical Therapy Treatment  Patient Details  Name: Autumn Allen MRN: [id number] Date of Birth: 09/03/1929 Referring Provider:  Geoffery Lyons, MD  Encounter Date: 07/02/2014      PT End of Session - 07/02/14 1408    Visit Number 6   Number of Visits 17   Date for PT Re-Evaluation 08/02/14   PT Start Time 1402   PT Stop Time 1445   PT Time Calculation (min) 43 min   Equipment Utilized During Treatment Gait belt   Activity Tolerance Patient tolerated treatment well   Behavior During Therapy [facility] for tasks assessed/performed      Past Medical History  Diagnosis Date  . Hypertension   . Hypothyroid   . Hyperlipemia   . Sleep apnea   . Depression   . Chronic cystitis   . Plantar fasciitis   . S/P TAH-BSO   . S/P cholecystectomy   . Hx of cardiovascular stress test     a. ETT-Echo 3/12:  EF 60%, normal study  . OSA on CPAP   . Hx of echocardiogram     a. Echo 2/14:  Mild LVH, EF 60-65%, Gr 1 diast dysfn, mild to mod AS, mean 17 mmHg, AVA 1.3 (VTI), trivial MR, mild LAE, PASP 31   . Aortic stenosis     a. mild to mod by Echo 07/2012  . Palpitations     a. event monitor 3/14:  NSR, sinus brady  . Allergy   . Arthritis   . Gait disturbance 06/10/2014    Past Surgical History  Procedure Laterality Date  . Cholecystectomy    . Abdominal hysterectomy    . Incontinence surgery    . Tonsillectomy      There were no vitals taken for this visit.  Visit Diagnosis:  Abnormality of gait  Activity intolerance      Subjective Assessment - 07/02/14 1407    Symptoms No new complaints. No pain or falls to report. Reports doing HEP every day except today.   Currently in Pain? No/denies           [facility] Adult PT Treatment/Exercise - 07/02/14 1412    Ambulation/Gait   Ambulation/Gait Yes   Ambulation/Gait  Assistance 5: Supervision   Ambulation/Gait Assistance Details pt performing head turns and nods with gait laps with no loss of balance, minor veering noted.                                    Ambulation Distance (Feet) 50 Feet  x4   Assistive device None   Gait Pattern Step-through pattern   Ambulation Surface Level;Indoor   Berg Balance Test   Sit to Stand Able to stand without using hands and stabilize independently   Standing Unsupported Able to stand safely 2 minutes   Sitting with Back Unsupported but Feet Supported on Floor or Stool Able to sit safely and securely 2 minutes   Stand to Sit Sits safely with minimal use of hands   Transfers Able to transfer safely, minor use of hands   Standing Unsupported with Eyes Closed Able to stand 10 seconds safely   Standing Ubsupported with Feet Together Able to place feet together independently and stand 1 minute safely   From Standing, Reach Forward with Outstretched Arm Can reach  confidently >25 cm (10")   From Standing Position, Pick up Object from Floor Able to pick up shoe safely and easily   From Standing Position, Turn to Look Behind Over each Shoulder Looks behind one side only/other side shows less weight shift  left > right   Turn 360 Degrees Able to turn 360 degrees safely but slowly  left= 4.06 sec, right= 4.50 sec   Standing Unsupported, Alternately Place Feet on Step/Stool Able to stand independently and safely and complete 8 steps in 20 seconds   Standing Unsupported, One Foot in Front Able to plae foot ahead of the other independently and hold 30 seconds   Standing on One Leg Tries to lift leg/unable to hold 3 seconds but remains standing independently   Total Score 49   Knee/Hip Exercises: Aerobic   Tread Mill 1.6 mph x 5 minutes with bil UE support  cues on stride length and posture     Neuro Re-ed: - on red mats: step over hurdles and around cones at varied spots x 6 laps with min assist - obstacle course: cones,  hurdles, red/blue mats with bean bags at varied locations under them and stepping stones between/connecting them- 6 laps with up to min assist for balance.         PT Short Term Goals - 07/02/14 1632    PT SHORT TERM GOAL #1   Title demonstrates initial HEP correctly. (Target Date: 07/03/14)   Time 4   Period Weeks   Status Achieved   PT SHORT TERM GOAL #2   Title Berg Balance >46/56  (Target Date: 07/03/14)   Time 4   Period Weeks   Status Achieved   PT SHORT TERM GOAL #3   Title ambulates with head turns to scan & negotiate around obstacles without balance loss modified independent.  (Target Date: 07/03/14)   Time 4   Period Weeks   Status Achieved           PT Long Term Goals - 06/03/14 1707    PT LONG TERM GOAL #1   Title demonstrates understanding of ongoing / progressive HEP  (Target Date: 08/02/14)   Time 8   Period Weeks   Status New   PT LONG TERM GOAL #2   Title Berg Balance >/= 52/56 (Target Date: 08/02/14)   Time 8   Period Weeks   Status New   PT LONG TERM GOAL #3   Title Dynamic Gait Index >/= 19/24 (Target Date: 08/02/14)   Time 8   Period Weeks   Status New   PT LONG TERM GOAL #4   Title ambulates >500' and negotiate ramp /curb without device modified independent. (Target Date: 08/02/14)   Time 8   Period Weeks   Status New           Plan - 07/02/14 1408    Clinical Impression Statement Pt has met all STG'S. Making steady progress toward her LTG's.   Pt will benefit from skilled therapeutic intervention in order to improve on the following deficits Abnormal gait;Decreased activity tolerance;Decreased balance;Decreased mobility;Decreased strength   Rehab Potential Good   PT Frequency 2x / week   PT Duration 2 weeks   PT Treatment/Interventions ADLs/Self Care Home Management;Gait training;Stair training;Functional mobility training;Therapeutic activities;Therapeutic exercise;Balance training;Neuromuscular re-education;Patient/family education   PT  Next Visit Plan  Continue with balance activities on compliant surfaces and with head movements   PT Home Exercise Plan balance & strength   Consulted and Agree with Plan of  Care Patient        Problem List Patient Active Problem List   Diagnosis Date Noted  . Gait disturbance 06/10/2014  . MURMUR 08/14/2010  . CHEST PAIN 08/14/2010    Willow Ora 07/02/2014, 4:37 PM  Willow Ora, PTA, [place] [address] [place], [place] [address] [phone] 07/02/2014, 4:37 PM

## 2014-07-04 ENCOUNTER — Ambulatory Visit: Payer: Medicare Other

## 2014-07-04 DIAGNOSIS — R269 Unspecified abnormalities of gait and mobility: Secondary | ICD-10-CM

## 2014-07-04 DIAGNOSIS — R6889 Other general symptoms and signs: Secondary | ICD-10-CM

## 2014-07-04 NOTE — Patient Instructions (Signed)
ABDUCTION: Standing (Active)   Stand up tall, feet flat with kicking leg half step behind you, while holding onto railing at home. Lift right leg out to side.  Repeat with other leg. Take rest breaks as needed. Complete _3__ sets of _10__ repetitions. Perform _4__ sessions per week (every other day).  http://gtsc.exer.us/111   Copyright  VHI. All rights reserved.  EXTENSION: Standing (Active)   Stand up tall, both feet flat, while holding onto railing at home. Draw right leg behind body as far as possible. Repeat with other leg. Complete _3__ sets of _10__ repetitions. Perform _4__ sessions per week (every other day).  http://gtsc.exer.us/77   Copyright  VHI. All rights reserved.

## 2014-07-04 NOTE — Therapy (Signed)
[facility] [address] [place], [place], [address] Phone: [phone]   Fax:  [phone]  Physical Therapy Treatment  Patient Details  Name: Autumn Allen MRN: [id number] Date of Birth: 04-08-1930 Referring Provider:  Minda Meo, MD  Encounter Date: 07/04/2014      PT End of Session - 07/04/14 1541    Visit Number 7   Number of Visits 17   Date for PT Re-Evaluation 08/02/14   Authorization Type G-code every 10th visit   PT Start Time 1447   PT Stop Time 1527   PT Time Calculation (min) 40 min   Equipment Utilized During Treatment Gait belt   Activity Tolerance Patient tolerated treatment well   Behavior During Therapy [facility] for tasks assessed/performed      Past Medical History  Diagnosis Date  . Hypertension   . Hypothyroid   . Hyperlipemia   . Sleep apnea   . Depression   . Chronic cystitis   . Plantar fasciitis   . S/P TAH-BSO   . S/P cholecystectomy   . Hx of cardiovascular stress test     a. ETT-Echo 3/12:  EF 60%, normal study  . OSA on CPAP   . Hx of echocardiogram     a. Echo 2/14:  Mild LVH, EF 60-65%, Gr 1 diast dysfn, mild to mod AS, mean 17 mmHg, AVA 1.3 (VTI), trivial MR, mild LAE, PASP 31   . Aortic stenosis     a. mild to mod by Echo 07/2012  . Palpitations     a. event monitor 3/14:  NSR, sinus brady  . Allergy   . Arthritis   . Gait disturbance 06/10/2014    Past Surgical History  Procedure Laterality Date  . Cholecystectomy    . Abdominal hysterectomy    . Incontinence surgery    . Tonsillectomy      There were no vitals taken for this visit.  Visit Diagnosis:  Abnormality of gait  Activity intolerance      Subjective Assessment - 07/04/14 1450    Symptoms Pt denied falls or changes since last visit. "I'm very pleased with my progress so far."   Patient Stated Goals To get around better without falling.   Currently in Pain? No/denies                     [facility] Adult PT Treatment/Exercise - 07/04/14 1549    Ambulation/Gait   Ambulation/Gait Yes   Ambulation/Gait Assistance 5: Supervision   Ambulation/Gait Assistance Details Pt ambulated over even terrain while performing head turns. VC's to not veer off path during head turns, and to improve narrow BOS during 2 LOB episodes while performing head turns. Pt self corrected 2 LOB episodes with wall support. Pt also ambluated over compliant surface, in lateral/anterior/posterior directions with cues to improve posture and stride length.   Ambulation Distance (Feet) --  230'x2, 100'x2, 6x7'   Assistive device None   Gait Pattern Step-through pattern  occasional narrow BOS during head turns   Ambulation Surface --   Ramp 5: Supervision   Ramp Details (indicate cue type and reason) Pt ascendend/descended ramp with supervision to ensure safety.    Balance   Balance Assessed Yes   Dynamic Standing Balance   Dynamic Standing - Balance Support No upper extremity supported   Dynamic Standing - Level of Assistance 4: Min assist;Other (comment)  min guard   Dynamic Standing - Balance Activities Lateral lean/weight shifting;Forward  lean/weight shifting;Other (comment);Rocker board   Dynamic Standing - Comments Rockerboard in parallel bars (lateral/anterior/posterior); with no UE support. Pt performed weight shifting in all directions while looking straight ahead, and performed head turns while keeping rockerboard in the center. pt required min A during 4 LOB episodes. Pt performed tandem ambulation and B hip marches without UE support in parallel bars 4x7'/activity. VC's to improve weight shifting and decrease trunk activity during weight shifting.   Exercises   Exercises Knee/Hip   Knee/Hip Exercises: Standing   Other Standing Knee Exercises B LE standing hip ext/abd 3x10/LE. VC's and demonstration for technique. pt required rest breaks during reps due to hip muscle fatigue.                 PT Education - 07/04/14 1540    Education provided Yes   Education Details Hip strengthening HEP   Person(s) Educated Patient   Methods Demonstration;Explanation;Verbal cues;Handout   Comprehension Verbalized understanding;Returned demonstration          PT Short Term Goals - 07/02/14 1632    PT SHORT TERM GOAL #1   Title demonstrates initial HEP correctly. (Target Date: 07/03/14)   Time 4   Period Weeks   Status Achieved   PT SHORT TERM GOAL #2   Title Berg Balance >46/56  (Target Date: 07/03/14)   Time 4   Period Weeks   Status Achieved   PT SHORT TERM GOAL #3   Title ambulates with head turns to scan & negotiate around obstacles without balance loss modified independent.  (Target Date: 07/03/14)   Time 4   Period Weeks   Status Achieved           PT Long Term Goals - 06/03/14 1707    PT LONG TERM GOAL #1   Title demonstrates understanding of ongoing / progressive HEP  (Target Date: 08/02/14)   Time 8   Period Weeks   Status New   PT LONG TERM GOAL #2   Title Berg Balance >/= 52/56 (Target Date: 08/02/14)   Time 8   Period Weeks   Status New   PT LONG TERM GOAL #3   Title Dynamic Gait Index >/= 19/24 (Target Date: 08/02/14)   Time 8   Period Weeks   Status New   PT LONG TERM GOAL #4   Title ambulates >500' and negotiate ramp /curb without device modified independent. (Target Date: 08/02/14)   Time 8   Period Weeks   Status New               Plan - 07/04/14 1541    Clinical Impression Statement Pt demonstrating progress as she was able to perform dynamic balance activities with min guard to min A. Pt noted to experience increased posterior trunk lean during balance activites, which could be due to weak hip extensors. Pt reported "wooziness" after ambulation and may benefit from vestibular assessment next visit. Pt would continue to benefit from skilled PT to improve safety during functional mobility.   Pt will benefit from skilled  therapeutic intervention in order to improve on the following deficits Abnormal gait;Decreased activity tolerance;Decreased balance;Decreased mobility;Decreased strength   Rehab Potential Good   PT Frequency 2x / week   PT Duration 8 weeks   PT Treatment/Interventions ADLs/Self Care Home Management;Gait training;Stair training;Functional mobility training;Therapeutic activities;Therapeutic exercise;Balance training;Neuromuscular re-education;Patient/family education   PT Next Visit Plan Vestibular assessment due to pt reporting "wooziness" after ambulating while performing head turns. Progress dynamic balance activities.   PT  Home Exercise Plan balance & strength   Consulted and Agree with Plan of Care Patient        Problem List Patient Active Problem List   Diagnosis Date Noted  . Gait disturbance 06/10/2014  . MURMUR 08/14/2010  . CHEST PAIN 08/14/2010    Miller,Jennifer L 07/04/2014, 3:56 PM  [facility] [address] [place], [place], [address] Phone: [phone]   Fax:  [phone]     Zerita Boers, PT,DPT 07/04/2014 3:56 PM Phone: [phone] Fax: [phone]

## 2014-07-09 ENCOUNTER — Ambulatory Visit: Payer: Medicare Other | Admitting: Physical Therapy

## 2014-07-09 ENCOUNTER — Encounter: Payer: Self-pay | Admitting: Physical Therapy

## 2014-07-09 DIAGNOSIS — R269 Unspecified abnormalities of gait and mobility: Secondary | ICD-10-CM | POA: Diagnosis not present

## 2014-07-09 DIAGNOSIS — R6889 Other general symptoms and signs: Secondary | ICD-10-CM

## 2014-07-09 NOTE — Patient Instructions (Signed)
Walking Head Turn   Walking in hall while turning head side to side, then up/down then up-right to down-left and finally up-left to down-right. Touch wall if necessary to keep balance. Repeat __10__ times each direction of movent. Do __1-2__ sessions per day.  http://gt2.exer.us/536   Copyright  VHI. All rights reserved.   2. Walk fast. Remember to take longer steps.   Figure Eight   Walk in a figure eight pattern. Can put 2 chairs or items to walk around. Remember to turn "headlights on pelvis" Repeat __10__ times per session. Do __1-2__ sessions per day.   Copyright  VHI. All rights reserved.

## 2014-07-09 NOTE — Therapy (Signed)
[facility] [address] [place], [place], [address] Phone: [phone]   Fax:  [phone]  Physical Therapy Treatment  Patient Details  Name: Autumn BridgemanKarlyn S Wilms MRN: [id number] Date of Birth: February 03, 1930 Referring Provider:  Minda MeoAronson, Richard A, MD  Encounter Date: 07/09/2014      PT End of Session - 07/09/14 1400    Visit Number 8   Number of Visits 17   Date for PT Re-Evaluation 08/02/14   Authorization Type G-code every 10th visit   PT Start Time 1400   PT Stop Time 1445   PT Time Calculation (min) 45 min   Equipment Utilized During Treatment Gait belt   Activity Tolerance Patient tolerated treatment well   Behavior [facility] for tasks assessed/performed      Past Medical History  Diagnosis Date  . Hypertension   . Hypothyroid   . Hyperlipemia   . Sleep apnea   . Depression   . Chronic cystitis   . Plantar fasciitis   . S/P TAH-BSO   . S/P cholecystectomy   . Hx of cardiovascular stress test     a. ETT-Echo 3/12:  EF 60%, normal study  . OSA on CPAP   . Hx of echocardiogram     a. Echo 2/14:  Mild LVH, EF 60-65%, Gr 1 diast dysfn, mild to mod AS, mean 17 mmHg, AVA 1.3 (VTI), trivial MR, mild LAE, PASP 31   . Aortic stenosis     a. mild to mod by Echo 07/2012  . Palpitations     a. event monitor 3/14:  NSR, sinus brady  . Allergy   . Arthritis   . Gait disturbance 06/10/2014    Past Surgical History  Procedure Laterality Date  . Cholecystectomy    . Abdominal hysterectomy    . Incontinence surgery    . Tonsillectomy      There were no vitals taken for this visit.  Visit Diagnosis:  Abnormality of gait  Activity intolerance      Subjective Assessment - 07/09/14 1400    Symptoms Patient denies falls. She reports no more issues with dizziness.   Currently in Pain? No/denies              Vestibular Assessment - 07/09/14 1804    Occulomotor Alignment Normal   Smooth  Pursuits Intact   Saccades Intact              [facility] Adult PT Treatment/Exercise - 07/09/14 1400    Ambulation/Gait   Ambulation/Gait Yes   Ambulation/Gait Assistance 5: Supervision   Ambulation/Gait Assistance Details head turns with tactile cues for path & pace   Ambulation Distance (Feet) 200 Feet   Assistive device None   Gait Pattern Step-through pattern   Ambulation Surface Level;Indoor   Stairs Yes   Stairs Assistance 5: Supervision   Stairs Assistance Details (indicate cue type and reason) PT demo/instructed technique to ascend & descend   Stair Management Technique One rail Right;Alternating pattern;Forwards   Number of Stairs 4  5x                PT Education - 07/09/14 1400    Education provided Yes   Education Details see patient instructions   Person(s) Educated Patient   Methods Explanation;Demonstration;Handout;Tactile cues;Verbal cues   Comprehension Verbalized understanding;Returned demonstration;Need further instruction          PT Short Term Goals - 07/02/14 1632    PT SHORT TERM GOAL #1  Title demonstrates initial HEP correctly. (Target Date: 07/03/14)   Time 4   Period Weeks   Status Achieved   PT SHORT TERM GOAL #2   Title Berg Balance >46/56  (Target Date: 07/03/14)   Time 4   Period Weeks   Status Achieved   PT SHORT TERM GOAL #3   Title ambulates with head turns to scan & negotiate around obstacles without balance loss modified independent.  (Target Date: 07/03/14)   Time 4   Period Weeks   Status Achieved           PT Long Term Goals - 06/03/14 1707    PT LONG TERM GOAL #1   Title demonstrates understanding of ongoing / progressive HEP  (Target Date: 08/02/14)   Time 8   Period Weeks   Status New   PT LONG TERM GOAL #2   Title Berg Balance >/= 52/56 (Target Date: 08/02/14)   Time 8   Period Weeks   Status New   PT LONG TERM GOAL #3   Title Dynamic Gait Index >/= 19/24 (Target Date: 08/02/14)   Time 8   Period  Weeks   Status New   PT LONG TERM GOAL #4   Title ambulates >500' and negotiate ramp /curb without device modified independent. (Target Date: 08/02/14)   Time 8   Period Weeks   Status New               Plan - 07/09/14 1400    Clinical Impression Statement Patient was able to improve gait with head turns with initial tactile cues and repetition.   Pt will benefit from skilled therapeutic intervention in order to improve on the following deficits Abnormal gait;Decreased activity tolerance;Decreased balance;Decreased mobility;Decreased strength   Rehab Potential Good   PT Frequency 2x / week   PT Duration 8 weeks   PT Treatment/Interventions ADLs/Self Care Home Management;Gait training;Stair training;Functional mobility training;Therapeutic activities;Therapeutic exercise;Balance training;Neuromuscular re-education;Patient/family education   PT Next Visit Plan Review updated HEP of gait with head turns, changing speed and Figure 8s. Progress dynamic balance activities.   PT Home Exercise Plan balance & strength   Consulted and Agree with Plan of Care Patient        Problem List Patient Active Problem List   Diagnosis Date Noted  . Gait disturbance 06/10/2014  . MURMUR 08/14/2010  . CHEST PAIN 08/14/2010    Vladimir Faster PT, DPT 07/09/2014, 6:12 PM  [facility] [address] [place], [place], [address] Phone: [phone]   Fax:  [phone]

## 2014-07-11 ENCOUNTER — Ambulatory Visit: Payer: Medicare Other | Admitting: Physical Therapy

## 2014-07-16 ENCOUNTER — Encounter: Payer: Self-pay | Admitting: Physical Therapy

## 2014-07-16 ENCOUNTER — Ambulatory Visit: Payer: Medicare Other | Attending: Internal Medicine | Admitting: Physical Therapy

## 2014-07-16 DIAGNOSIS — R6889 Other general symptoms and signs: Secondary | ICD-10-CM | POA: Insufficient documentation

## 2014-07-16 DIAGNOSIS — R269 Unspecified abnormalities of gait and mobility: Secondary | ICD-10-CM | POA: Insufficient documentation

## 2014-07-16 NOTE — Patient Instructions (Signed)
4 components to fitness / exercise program. Goal is to get to each component 3-5 times per week.  1) balance: sidestepping, braiding, heel walking, toe walking, heel-toe drunk walk, Figure 8s,   Kicking to side & kicking to back - (hips) alternating legs makes this balance & strength work 10 -15 reps per leg  Walking with head turns, walking fast (Hallway)  2)Flexibility: Hamstring & heelcord stretches in chair  20-30 seconds 2-3 times per leg   Trunk rotation (twisting) sitting  20-30 seconds hold each way 2-3 times each way.  3) Strength: kicks above, walking toes & heels,   Stand up & sit down without arms. 10 times.  "Dirty Toilet" Squat over chair for 5 seconds repeat 10-15 times or brother's exercise with chair behind you  4) endurance: walking around hallway, pool (laps or walking), bike  Heart Rate for 79yo recommendation is 83-110. If over 110, slow down or make it easier.  Goal is increasing the time over the intensity. You want to get heart rate up for 20-30 minutes.  Intensity: seated vs standing exercises are less intense. Slower speeds are easier. Mix up the exercises Maybe I bike 10 minutes & walk 10 minutes.  10 minute rule: set a timer for 10 minutes - work (bike or walk) for let's day 6 minutes then rest the other 4. Don't short change rest. Repeat this cycle of 6 work:4 rest times. You can increase the work time over a few weeks. When the 6 work:4 rest is not difficult or challenging you. Increase to 7 work: 3 rest.   Mon, Wed, Fri - Balance & strength Tues, Thurs, Sat - endurance Flexibility daily during down times like commercials.

## 2014-07-16 NOTE — Therapy (Signed)
[facility] [address] [place], [place], [address] Phone: [phone]   Fax:  [phone]  Physical Therapy Treatment  Patient Details  Name: Autumn Allen MRN: [id number] Date of Birth: 07-14-1929 Referring Provider:  Minda Meo, MD  Encounter Date: 07/16/2014      PT End of Session - 07/16/14 1400    Visit Number 9   Number of Visits 17   Date for PT Re-Evaluation 08/02/14   Authorization Type G-code every 10th visit   PT Start Time 1405   PT Stop Time 1445   PT Time Calculation (min) 40 min   Equipment Utilized During Treatment Gait belt   Activity Tolerance Patient tolerated treatment well   Behavior During Therapy [facility] for tasks assessed/performed      Past Medical History  Diagnosis Date  . Hypertension   . Hypothyroid   . Hyperlipemia   . Sleep apnea   . Depression   . Chronic cystitis   . Plantar fasciitis   . S/P TAH-BSO   . S/P cholecystectomy   . Hx of cardiovascular stress test     a. ETT-Echo 3/12:  EF 60%, normal study  . OSA on CPAP   . Hx of echocardiogram     a. Echo 2/14:  Mild LVH, EF 60-65%, Gr 1 diast dysfn, mild to mod AS, mean 17 mmHg, AVA 1.3 (VTI), trivial MR, mild LAE, PASP 31   . Aortic stenosis     a. mild to mod by Echo 07/2012  . Palpitations     a. event monitor 3/14:  NSR, sinus brady  . Allergy   . Arthritis   . Gait disturbance 06/10/2014    Past Surgical History  Procedure Laterality Date  . Cholecystectomy    . Abdominal hysterectomy    . Incontinence surgery    . Tonsillectomy      There were no vitals taken for this visit.  Visit Diagnosis:  Abnormality of gait  Activity intolerance      Subjective Assessment - 07/16/14 1411    Symptoms Denies falls. No issues with dizzines still.    Currently in Pain? No/denies                    [facility] Adult PT Treatment/Exercise - 07/16/14 1400    Ambulation/Gait   Ambulation/Gait Yes    Ambulation/Gait Assistance 5: Supervision   Ambulation Distance (Feet) 200 Feet   Assistive device None   Gait Pattern Step-through pattern   Stairs Yes   Stairs Assistance 5: Supervision   Stair Management Technique One rail Right;Alternating pattern;Forwards   Number of Stairs 4  5x         Therapeutic Exercise: PT instructed with written handout an overview of fitness / exercise plan. See patient instructions for overview of 40 minute instructions.        PT Education - 07/16/14 1818    Education provided Yes   Education Details see patient instructions   Person(s) Educated Patient   Methods Explanation;Handout   Comprehension Verbalized understanding;Need further instruction          PT Short Term Goals - 07/02/14 1632    PT SHORT TERM GOAL #1   Title demonstrates initial HEP correctly. (Target Date: 07/03/14)   Time 4   Period Weeks   Status Achieved   PT SHORT TERM GOAL #2   Title Berg Balance >46/56  (Target Date: 07/03/14)   Time 4  Period Weeks   Status Achieved   PT SHORT TERM GOAL #3   Title ambulates with head turns to scan & negotiate around obstacles without balance loss modified independent.  (Target Date: 07/03/14)   Time 4   Period Weeks   Status Achieved           PT Long Term Goals - 06/03/14 1707    PT LONG TERM GOAL #1   Title demonstrates understanding of ongoing / progressive HEP  (Target Date: 08/02/14)   Time 8   Period Weeks   Status New   PT LONG TERM GOAL #2   Title Berg Balance >/= 52/56 (Target Date: 08/02/14)   Time 8   Period Weeks   Status New   PT LONG TERM GOAL #3   Title Dynamic Gait Index >/= 19/24 (Target Date: 08/02/14)   Time 8   Period Weeks   Status New   PT LONG TERM GOAL #4   Title ambulates >500' and negotiate ramp /curb without device modified independent. (Target Date: 08/02/14)   Time 8   Period Weeks   Status New               Plan - 07/16/14 1400    Clinical Impression Statement Patient  improved understanding of overall fitness / exercise plan. Patient appears to understand need for ongoing exercise / fitness to maintain balance & mobility.    Pt will benefit from skilled therapeutic intervention in order to improve on the following deficits Abnormal gait;Decreased activity tolerance;Decreased balance;Decreased mobility;Decreased strength   Rehab Potential Good   PT Frequency 2x / week   PT Duration 8 weeks   PT Treatment/Interventions ADLs/Self Care Home Management;Gait training;Stair training;Functional mobility training;Therapeutic activities;Therapeutic exercise;Balance training;Neuromuscular re-education;Patient/family education   PT Next Visit Plan Do G code: Berg & DGI   PT Home Exercise Plan --   Consulted and Agree with Plan of Care Patient        Problem List Patient Active Problem List   Diagnosis Date Noted  . Gait disturbance 06/10/2014  . MURMUR 08/14/2010  . CHEST PAIN 08/14/2010    Vladimir FasterWALDRON,Lashaunta Sicard PT, DPT 07/16/2014, 6:22 PM  [facility] [address] [place], [place], [address] Phone: [phone]   Fax:  [phone]

## 2014-07-18 ENCOUNTER — Ambulatory Visit: Payer: Medicare Other | Admitting: Physical Therapy

## 2014-07-18 ENCOUNTER — Encounter: Payer: Self-pay | Admitting: Physical Therapy

## 2014-07-18 DIAGNOSIS — R269 Unspecified abnormalities of gait and mobility: Secondary | ICD-10-CM

## 2014-07-18 DIAGNOSIS — R6889 Other general symptoms and signs: Secondary | ICD-10-CM

## 2014-07-18 NOTE — Therapy (Signed)
[facility] [address] [place], [place], [address] Phone: [phone]   Fax:  [phone]  Physical Therapy Treatment  Patient Details  Name: Autumn BridgemanKarlyn S Trela MRN: [id number] Date of Birth: 10-04-1929 Referring Provider:  Minda MeoAronson, Richard A, MD  Encounter Date: 07/18/2014      PT End of Session - 07/18/14 1409    Visit Number 10   Number of Visits 17   Date for PT Re-Evaluation 08/02/14   Authorization Type G-code every 10th visit   PT Start Time 1404   PT Stop Time 1443   PT Time Calculation (min) 39 min   Equipment Utilized During Treatment Gait belt   Activity Tolerance Patient tolerated treatment well   Behavior During Therapy [facility] for tasks assessed/performed      Past Medical History  Diagnosis Date  . Hypertension   . Hypothyroid   . Hyperlipemia   . Sleep apnea   . Depression   . Chronic cystitis   . Plantar fasciitis   . S/P TAH-BSO   . S/P cholecystectomy   . Hx of cardiovascular stress test     a. ETT-Echo 3/12:  EF 60%, normal study  . OSA on CPAP   . Hx of echocardiogram     a. Echo 2/14:  Mild LVH, EF 60-65%, Gr 1 diast dysfn, mild to mod AS, mean 17 mmHg, AVA 1.3 (VTI), trivial MR, mild LAE, PASP 31   . Aortic stenosis     a. mild to mod by Echo 07/2012  . Palpitations     a. event monitor 3/14:  NSR, sinus brady  . Allergy   . Arthritis   . Gait disturbance 06/10/2014    Past Surgical History  Procedure Laterality Date  . Cholecystectomy    . Abdominal hysterectomy    . Incontinence surgery    . Tonsillectomy      There were no vitals taken for this visit.  Visit Diagnosis:  Abnormality of gait  Activity intolerance      Subjective Assessment - 07/18/14 1408    Symptoms No new complaints. No falls to report. No pain currently. Has her Mon-Sat exercise schedule written out containing all components of exercise.   Currently in Pain? No/denies          [facility]  Treatment/Exercise - 07/18/14 1412    Dynamic Standing Balance   Dynamic Standing - Balance Support No upper extremity supported;During functional activity   Dynamic Standing - Level of Assistance 4: Min assist   Dynamic Standing - Balance Activities Foam balance beam   Dynamic Standing - Comments with feet across blue foam beam: sit/stands x 10 reps with up to min assist and cues on weight shfiting.                                  Berg Balance Test   Sit to Stand Able to stand without using hands and stabilize independently   Standing Unsupported Able to stand safely 2 minutes   Sitting with Back Unsupported but Feet Supported on Floor or Stool Able to sit safely and securely 2 minutes   Stand to Sit Sits safely with minimal use of hands   Transfers Able to transfer safely, minor use of hands   Standing Unsupported with Eyes Closed Able to stand 10 seconds safely   Standing Ubsupported with Feet Together Able to place feet together independently  and stand 1 minute safely   From Standing, Reach Forward with Outstretched Arm Can reach forward >12 cm safely (5")  8 inches   From Standing Position, Pick up Object from Floor Able to pick up shoe safely and easily   From Standing Position, Turn to Look Behind Over each Shoulder Looks behind from both sides and weight shifts well   Turn 360 Degrees Able to turn 360 degrees safely in 4 seconds or less  right 2.31, left 2.53   Standing Unsupported, Alternately Place Feet on Step/Stool Able to stand independently and safely and complete 8 steps in 20 seconds  8.40   Standing Unsupported, One Foot in Front Able to plae foot ahead of the other independently and hold 30 seconds   Standing on One Leg Tries to lift leg/unable to hold 3 seconds but remains standing independently   Total Score 51   Dynamic Gait Index   Level Surface Normal   Change in Gait Speed Normal   Gait with Horizontal Head Turns Normal   Gait with Vertical Head Turns Normal    Gait and Pivot Turn Normal   Step Over Obstacle Normal   Step Around Obstacles Normal   Steps Mild Impairment   Total Score 23   High Level Balance   High Level Balance Activities Marching forwards;Marching backwards;Tandem walking   High Level Balance Comments on red mats with up to min assist for balance            PT Short Term Goals - 07/02/14 1632    PT SHORT TERM GOAL #1   Title demonstrates initial HEP correctly. (Target Date: 07/03/14)   Time 4   Period Weeks   Status Achieved   PT SHORT TERM GOAL #2   Title Berg Balance >46/56  (Target Date: 07/03/14)   Time 4   Period Weeks   Status Achieved   PT SHORT TERM GOAL #3   Title ambulates with head turns to scan & negotiate around obstacles without balance loss modified independent.  (Target Date: 07/03/14)   Time 4   Period Weeks   Status Achieved           PT Long Term Goals - 06/03/14 1707    PT LONG TERM GOAL #1   Title demonstrates understanding of ongoing / progressive HEP  (Target Date: 08/02/14)   Time 8   Period Weeks   Status New   PT LONG TERM GOAL #2   Title Berg Balance >/= 52/56 (Target Date: 08/02/14)   Time 8   Period Weeks   Status New   PT LONG TERM GOAL #3   Title Dynamic Gait Index >/= 19/24 (Target Date: 08/02/14)   Time 8   Period Weeks   Status New   PT LONG TERM GOAL #4   Title ambulates >500' and negotiate ramp /curb without device modified independent. (Target Date: 08/02/14)   Time 8   Period Weeks   Status New           Plan - 07/18/14 1410    Clinical Impression Statement Pt with improved BERG and DGI scores today. Progressing well toward all goals.   Pt will benefit from skilled therapeutic intervention in order to improve on the following deficits Abnormal gait;Decreased activity tolerance;Decreased balance;Decreased mobility;Decreased strength   Rehab Potential Good   PT Frequency 2x / week   PT Duration 8 weeks   PT Treatment/Interventions ADLs/Self Care Home  Management;Gait training;Stair training;Functional mobility training;Therapeutic activities;Therapeutic exercise;Balance training;Neuromuscular  re-education;Patient/family education   PT Next Visit Plan Continue toward LTG's   Consulted and Agree with Plan of Care Patient       Problem List Patient Active Problem List   Diagnosis Date Noted  . Gait disturbance 06/10/2014  . MURMUR 08/14/2010  . CHEST PAIN 08/14/2010    Sallyanne Kuster 07/18/2014, 2:45 PM  Sallyanne Kuster, PTA, [facility] [address] [place], [place] [address] [phone] 07/18/2014, 2:45 PM

## 2014-07-23 ENCOUNTER — Encounter: Payer: Medicare Other | Admitting: Physical Therapy

## 2014-07-25 ENCOUNTER — Ambulatory Visit: Payer: Medicare Other | Admitting: Physical Therapy

## 2014-07-25 ENCOUNTER — Encounter: Payer: Self-pay | Admitting: Physical Therapy

## 2014-07-25 DIAGNOSIS — R6889 Other general symptoms and signs: Secondary | ICD-10-CM

## 2014-07-25 DIAGNOSIS — R269 Unspecified abnormalities of gait and mobility: Secondary | ICD-10-CM | POA: Diagnosis not present

## 2014-07-25 NOTE — Therapy (Signed)
[facility] [address] [place], [place], [address] Phone: [phone]   Fax:  [phone]  Physical Therapy Treatment  Patient Details  Name: Autumn Allen MRN: [id number] Date of Birth: 11/11/1929 Referring Provider:  Geoffery Lyons, MD  Encounter Date: 07/25/2014      PT End of Session - 07/25/14 1403    Visit Number 11   Number of Visits 17   Date for PT Re-Evaluation 08/02/14   Authorization Type G-code every 10th visit   PT Start Time 1317   PT Stop Time 1356   PT Time Calculation (min) 39 min   Equipment Utilized During Treatment Gait belt   Activity Tolerance Patient tolerated treatment well   Behavior During Therapy [facility] for tasks assessed/performed      Past Medical History  Diagnosis Date  . Hypertension   . Hypothyroid   . Hyperlipemia   . Sleep apnea   . Depression   . Chronic cystitis   . Plantar fasciitis   . S/P TAH-BSO   . S/P cholecystectomy   . Hx of cardiovascular stress test     a. ETT-Echo 3/12:  EF 60%, normal study  . OSA on CPAP   . Hx of echocardiogram     a. Echo 2/14:  Mild LVH, EF 60-65%, Gr 1 diast dysfn, mild to mod AS, mean 17 mmHg, AVA 1.3 (VTI), trivial MR, mild LAE, PASP 31   . Aortic stenosis     a. mild to mod by Echo 07/2012  . Palpitations     a. event monitor 3/14:  NSR, sinus brady  . Allergy   . Arthritis   . Gait disturbance 06/10/2014    Past Surgical History  Procedure Laterality Date  . Cholecystectomy    . Abdominal hysterectomy    . Incontinence surgery    . Tonsillectomy      There were no vitals taken for this visit.  Visit Diagnosis:  Abnormality of gait  Activity intolerance      Subjective Assessment - 07/25/14 1324    Symptoms No new complaints. No falls or pain to report. Following her HEP schedule without issues.   Currently in Pain? No/denies          [facility] Adult PT Treatment/Exercise - 07/25/14 1326    Ambulation/Gait   Ambulation/Gait Yes   Ambulation/Gait Assistance 6: Modified independent (Device/Increase time)   Ambulation Distance (Feet) 650 Feet  x1   Assistive device None   Gait Pattern Within Functional Limits   Ambulation Surface Level;Indoor   Gait velocity 8.56 sec= 3.83  ft/sec   Ramp 6: Modified independent (Device)   Ramp Details (indicate cue type and reason) slows down on ramp   Curb 6: Modified independent (Device/increase time)   Gait Comments slows pace down for curb management   Berg Balance Test   Sit to Stand Able to stand without using hands and stabilize independently   Standing Unsupported Able to stand safely 2 minutes   Sitting with Back Unsupported but Feet Supported on Floor or Stool Able to sit safely and securely 2 minutes   Stand to Sit Sits safely with minimal use of hands   Transfers Able to transfer safely, minor use of hands   Standing Unsupported with Eyes Closed Able to stand 10 seconds safely   Standing Ubsupported with Feet Together Able to place feet together independently and stand 1 minute safely   From Standing, Reach Forward with Outstretched Arm Can  reach confidently >25 cm (10")  10.5 inches   From Standing Position, Pick up Object from [place] to pick up shoe safely and easily   From Standing Position, Turn to Look Behind Over each Shoulder Looks behind from both sides and weight shifts well   Turn 360 Degrees Able to turn 360 degrees safely in 4 seconds or less   Standing Unsupported, Alternately Place Feet on Step/Stool Able to stand independently and safely and complete 8 steps in 20 seconds  7.63   Standing Unsupported, One Foot in [place] to plae foot ahead of the other independently and hold 30 seconds   Standing on One Leg Able to lift leg independently and hold equal to or more than 3 seconds  left stance leg only ,right < 3 sec hold   Total Score 53           PT Short Term Goals - 07/02/14 1632    PT SHORT TERM  GOAL #1   Title demonstrates initial HEP correctly. (Target Date: 07/03/14)   Time 4   Period Weeks   Status Achieved   PT SHORT TERM GOAL #2   Title Berg Balance >46/56  (Target Date: 07/03/14)   Time 4   Period Weeks   Status Achieved   PT SHORT TERM GOAL #3   Title ambulates with head turns to scan & negotiate around obstacles without balance loss modified independent.  (Target Date: 07/03/14)   Time 4   Period Weeks   Status Achieved           PT Long Term Goals - 07/25/14 1348    PT LONG TERM GOAL #1   Title demonstrates understanding of ongoing / progressive HEP  (Target Date: 08/02/14)   Time 8   Period Weeks   Status Achieved   PT LONG TERM GOAL #2   Title Berg Balance >/= 52/56 (Target Date: 08/02/14)   Baseline met on 07/25/14- score 53/56   Time 8   Period Weeks   Status Achieved   PT LONG TERM GOAL #3   Title Dynamic Gait Index >/= 19/24 (Target Date: 08/02/14)   Baseline met on 07/18/14- score 23/24   Time 8   Period Weeks   Status Achieved   PT LONG TERM GOAL #4   Title ambulates >500' and negotiate ramp /curb without device modified independent. (Target Date: 08/02/14)   Time 8   Period Weeks   Status Achieved        Problem List Patient Active Problem List   Diagnosis Date Noted  . Gait disturbance 06/10/2014  . MURMUR 08/14/2010  . CHEST PAIN 08/14/2010    Willow Ora 07/25/2014, 4:46 PM  Willow Ora, PTA, [facility] [address], [place], [place] [address] [phone] 07/25/2014, 4:46 PM

## 2014-07-30 ENCOUNTER — Encounter: Payer: Self-pay | Admitting: Physical Therapy

## 2014-07-30 NOTE — Therapy (Signed)
[facility] [address] [place], [place], [address] Phone: [phone]   Fax:  [phone]  Patient Details  Name: Autumn Allen MRN: [id number] Date of Birth: [birth year] Referring Provider:  No ref. provider found  Encounter Date: 07/30/2014 PHYSICAL THERAPY DISCHARGE SUMMARY  Visits from Start of Care: 11  Current functional level related to goals / functional outcomes:     PT Long Term Goals - 07/25/14 1348    PT LONG TERM GOAL #1   Title demonstrates understanding of ongoing / progressive HEP  (Target Date: 08/02/14)   Time 8   Period Weeks   Status Achieved   PT LONG TERM GOAL #2   Title Berg Balance >/= 52/56 (Target Date: 08/02/14)   Baseline met on 07/25/14- score 53/56   Time 8   Period Weeks   Status Achieved   PT LONG TERM GOAL #3   Title Dynamic Gait Index >/= 19/24 (Target Date: 08/02/14)   Baseline met on 07/18/14- score 23/24   Time 8   Period Weeks   Status Achieved   PT LONG TERM GOAL #4   Title ambulates >500' and negotiate ramp /curb without device modified independent. (Target Date: 08/02/14)   Time 8   Period Weeks   Status Achieved       Remaining deficits: Patient has mild balance deficits noted by Dynamic Gait Index of 23/24 and Berg Balance 53/56.   Education / Equipment: Patient was instructed in fitness program including balance. She verbalized and return demo understanding.  Plan: Patient agrees to discharge.  Patient goals were met. Patient is being discharged due to meeting the stated rehab goals.  ?????       Ashlyn Cabler PT, DPT 07/30/2014, 10:43 AM  [facility] [address] [place], [place], [address] Phone: [phone]   Fax:  [phone]

## 2014-10-21 ENCOUNTER — Ambulatory Visit: Payer: Medicare Other | Admitting: Neurology

## 2014-10-21 ENCOUNTER — Telehealth: Payer: Self-pay

## 2014-10-21 NOTE — Telephone Encounter (Signed)
Patient did not come to a follow up appointment. 

## 2015-04-18 ENCOUNTER — Inpatient Hospital Stay (HOSPITAL_COMMUNITY)
Admission: EM | Admit: 2015-04-18 | Discharge: 2015-04-20 | DRG: 069 | Disposition: A | Discharge status: Home / Self Care | Payer: Medicare Other | Attending: Internal Medicine | Admitting: Internal Medicine

## 2015-04-18 ENCOUNTER — Encounter (HOSPITAL_COMMUNITY): Payer: Self-pay | Admitting: Family Medicine

## 2015-04-18 ENCOUNTER — Emergency Department (HOSPITAL_COMMUNITY): Payer: Medicare Other

## 2015-04-18 DIAGNOSIS — F32A Depression, unspecified: Secondary | ICD-10-CM

## 2015-04-18 DIAGNOSIS — G451 Carotid artery syndrome (hemispheric): Secondary | ICD-10-CM | POA: Diagnosis not present

## 2015-04-18 DIAGNOSIS — G4733 Obstructive sleep apnea (adult) (pediatric): Secondary | ICD-10-CM | POA: Diagnosis present

## 2015-04-18 DIAGNOSIS — G459 Transient cerebral ischemic attack, unspecified: Secondary | ICD-10-CM | POA: Diagnosis present

## 2015-04-18 DIAGNOSIS — Z87891 Personal history of nicotine dependence: Secondary | ICD-10-CM | POA: Diagnosis not present

## 2015-04-18 DIAGNOSIS — E785 Hyperlipidemia, unspecified: Secondary | ICD-10-CM | POA: Diagnosis present

## 2015-04-18 DIAGNOSIS — Z7982 Long term (current) use of aspirin: Secondary | ICD-10-CM | POA: Diagnosis not present

## 2015-04-18 DIAGNOSIS — F329 Major depressive disorder, single episode, unspecified: Secondary | ICD-10-CM

## 2015-04-18 DIAGNOSIS — E039 Hypothyroidism, unspecified: Secondary | ICD-10-CM | POA: Diagnosis present

## 2015-04-18 DIAGNOSIS — R42 Dizziness and giddiness: Secondary | ICD-10-CM | POA: Diagnosis not present

## 2015-04-18 DIAGNOSIS — I10 Essential (primary) hypertension: Secondary | ICD-10-CM | POA: Diagnosis present

## 2015-04-18 DIAGNOSIS — M179 Osteoarthritis of knee, unspecified: Secondary | ICD-10-CM | POA: Diagnosis present

## 2015-04-18 DIAGNOSIS — Z79899 Other long term (current) drug therapy: Secondary | ICD-10-CM | POA: Diagnosis not present

## 2015-04-18 LAB — COMPREHENSIVE METABOLIC PANEL
ALK PHOS: 48 U/L (ref 38–126)
ALT: 23 U/L (ref 14–54)
ANION GAP: 8 (ref 5–15)
AST: 25 U/L (ref 15–41)
Albumin: 3.8 g/dL (ref 3.5–5.0)
BUN: 15 mg/dL (ref 6–20)
CALCIUM: 9.5 mg/dL (ref 8.9–10.3)
CO2: 29 mmol/L (ref 22–32)
CREATININE: 0.82 mg/dL (ref 0.44–1.00)
Chloride: 103 mmol/L (ref 101–111)
Glucose, Bld: 94 mg/dL (ref 65–99)
Potassium: 4.1 mmol/L (ref 3.5–5.1)
Sodium: 140 mmol/L (ref 135–145)
TOTAL PROTEIN: 6.9 g/dL (ref 6.5–8.1)
Total Bilirubin: 0.9 mg/dL (ref 0.3–1.2)

## 2015-04-18 LAB — URINALYSIS, ROUTINE W REFLEX MICROSCOPIC
Bilirubin Urine: NEGATIVE
GLUCOSE, UA: NEGATIVE mg/dL
HGB URINE DIPSTICK: NEGATIVE
KETONES UR: NEGATIVE mg/dL
Nitrite: NEGATIVE
PH: 7.5 (ref 5.0–8.0)
Protein, ur: NEGATIVE mg/dL
Specific Gravity, Urine: 1.011 (ref 1.005–1.030)
Urobilinogen, UA: 0.2 mg/dL (ref 0.0–1.0)

## 2015-04-18 LAB — I-STAT CHEM 8, ED
BUN: 18 mg/dL (ref 6–20)
CALCIUM ION: 1.17 mmol/L (ref 1.13–1.30)
CREATININE: 0.7 mg/dL (ref 0.44–1.00)
Chloride: 101 mmol/L (ref 101–111)
Glucose, Bld: 90 mg/dL (ref 65–99)
HEMATOCRIT: 49 % — AB (ref 36.0–46.0)
HEMOGLOBIN: 16.7 g/dL — AB (ref 12.0–15.0)
Potassium: 4.1 mmol/L (ref 3.5–5.1)
Sodium: 141 mmol/L (ref 135–145)
TCO2: 28 mmol/L (ref 0–100)

## 2015-04-18 LAB — CBC
HEMATOCRIT: 46.9 % — AB (ref 36.0–46.0)
HEMOGLOBIN: 15.9 g/dL — AB (ref 12.0–15.0)
MCH: 33.5 pg (ref 26.0–34.0)
MCHC: 33.9 g/dL (ref 30.0–36.0)
MCV: 98.7 fL (ref 78.0–100.0)
Platelets: 225 10*3/uL (ref 150–400)
RBC: 4.75 MIL/uL (ref 3.87–5.11)
RDW: 12.1 % (ref 11.5–15.5)
WBC: 6.7 10*3/uL (ref 4.0–10.5)

## 2015-04-18 LAB — DIFFERENTIAL
BASOS PCT: 1 %
Basophils Absolute: 0 10*3/uL (ref 0.0–0.1)
EOS ABS: 0.2 10*3/uL (ref 0.0–0.7)
EOS PCT: 3 %
LYMPHS ABS: 2.3 10*3/uL (ref 0.7–4.0)
Lymphocytes Relative: 34 %
MONO ABS: 0.7 10*3/uL (ref 0.1–1.0)
MONOS PCT: 10 %
Neutro Abs: 3.6 10*3/uL (ref 1.7–7.7)
Neutrophils Relative %: 52 %

## 2015-04-18 LAB — URINE MICROSCOPIC-ADD ON

## 2015-04-18 LAB — PROTIME-INR
INR: 1.03 (ref 0.00–1.49)
Prothrombin Time: 13.7 seconds (ref 11.6–15.2)

## 2015-04-18 LAB — I-STAT TROPONIN, ED: TROPONIN I, POC: 0 ng/mL (ref 0.00–0.08)

## 2015-04-18 LAB — APTT: APTT: 26 s (ref 24–37)

## 2015-04-18 MED ORDER — ENOXAPARIN SODIUM 40 MG/0.4ML ~~LOC~~ SOLN
40.0000 mg | SUBCUTANEOUS | Status: DC
Start: 2015-04-18 — End: 2015-04-20
  Administered 2015-04-18 – 2015-04-19 (×2): 40 mg via SUBCUTANEOUS
  Filled 2015-04-18 (×2): qty 0.4

## 2015-04-18 MED ORDER — ESCITALOPRAM OXALATE 10 MG PO TABS
10.0000 mg | ORAL_TABLET | Freq: Every day | ORAL | Status: DC
  Filled 2015-04-18 (×3): qty 1

## 2015-04-18 MED ORDER — STROKE: EARLY STAGES OF RECOVERY BOOK
Freq: Once | Status: DC
  Filled 2015-04-18: qty 1

## 2015-04-18 MED ORDER — INDAPAMIDE 2.5 MG PO TABS
2.5000 mg | ORAL_TABLET | Freq: Every day | ORAL | Status: DC
  Administered 2015-04-19 – 2015-04-20 (×2): 2.5 mg via ORAL
  Filled 2015-04-18 (×3): qty 1

## 2015-04-18 MED ORDER — LEVOTHYROXINE SODIUM 50 MCG PO TABS
50.0000 ug | ORAL_TABLET | Freq: Every day | ORAL | Status: DC
  Administered 2015-04-20: 50 ug via ORAL
  Filled 2015-04-18: qty 1

## 2015-04-18 MED ORDER — LEVOTHYROXINE SODIUM 50 MCG PO TABS: 50.0000 ug | ORAL_TABLET | Freq: Every day | ORAL | Status: DC

## 2015-04-18 MED ORDER — ASPIRIN EC 81 MG PO TBEC
81.0000 mg | DELAYED_RELEASE_TABLET | Freq: Every day | ORAL | Status: DC
  Administered 2015-04-19 – 2015-04-20 (×2): 81 mg via ORAL
  Filled 2015-04-18 (×4): qty 1

## 2015-04-18 MED ORDER — FELODIPINE ER 5 MG PO TB24
5.0000 mg | ORAL_TABLET | Freq: Every day | ORAL | Status: DC
  Administered 2015-04-20: 5 mg via ORAL
  Filled 2015-04-18 (×3): qty 1

## 2015-04-18 NOTE — ED Provider Notes (Signed)
CSN: [id number]     Arrival date & time    History   First MD Initiated Contact with Patient 04/18/15 1320     Chief Complaint  Patient presents with  . Dizziness     (Consider location/radiation/quality/duration/timing/severity/associated sxs/prior Treatment) Patient is a 81 y.o. female presenting with dizziness. The history is provided by the patient.  Dizziness Quality:  Imbalance Severity:  Mild Onset quality:  Gradual Duration:  2 days Timing:  Constant Progression:  Waxing and waning Chronicity:  New Context: head movement, physical activity and standing up   Context: not with medication   Relieved by:  None tried Worsened by:  Closing eyes Ineffective treatments:  None tried Associated symptoms: no blood in stool, no chest pain, no diarrhea, no headaches, no hearing loss, no nausea, no palpitations, no shortness of breath, no syncope, no vision changes, no vomiting and no weakness   Risk factors: no anemia, no heart disease, no hx of stroke, no hx of vertigo, no multiple medications and no new medications     Past Medical History  Diagnosis Date  . Hypertension   . Hypothyroid   . Hyperlipemia   . Sleep apnea   . Depression   . Chronic cystitis   . Plantar fasciitis   . S/P TAH-BSO   . S/P cholecystectomy   . Hx of cardiovascular stress test     a. ETT-Echo 3/12:  EF 60%, normal study  . OSA on CPAP   . Hx of echocardiogram     a. Echo 2/14:  Mild LVH, EF 60-65%, Gr 1 diast dysfn, mild to mod AS, mean 17 mmHg, AVA 1.3 (VTI), trivial MR, mild LAE, PASP 31   . Aortic stenosis     a. mild to mod by Echo 07/2012  . Palpitations     a. event monitor 3/14:  NSR, sinus brady  . Allergy   . Arthritis   . Gait disturbance 06/10/2014   Past Surgical History  Procedure Laterality Date  . Cholecystectomy    . Abdominal hysterectomy    . Incontinence surgery    . Tonsillectomy     Family History  Problem Relation Age of Onset  . Heart attack Mother 6780  . Heart  failure Mother   . Lung disease Father   . Cancer - Prostate Father   . Healthy Brother   . Healthy Brother    Social History  Substance Use Topics  . Smoking status: Former Games developermoker  . Smokeless tobacco: Never Used  . Alcohol Use: No   OB History    No data available     Review of Systems  Constitutional: Negative for fever.  HENT: Negative for facial swelling and hearing loss.   Respiratory: Negative for shortness of breath.   Cardiovascular: Negative for chest pain, palpitations and syncope.  Gastrointestinal: Negative for nausea, vomiting, abdominal pain, diarrhea and blood in stool.  Genitourinary: Negative for dysuria.  Musculoskeletal: Negative for back pain.  Skin: Negative for rash.  Neurological: Positive for dizziness, facial asymmetry and speech difficulty. Negative for tremors, seizures, syncope, weakness, numbness and headaches.  Psychiatric/Behavioral: Positive for decreased concentration. Negative for confusion.      Allergies  Metronidazole and related; Ciprofloxacin; Etodolac; Naproxen; Nitrofurantoin; Sulfonamide derivatives; Alprazolam; Epinephrine; and Macrobid  Home Medications   Prior to Admission medications   Medication Sig Start Date End Date Taking? Authorizing Provider  aspirin EC 81 MG tablet Take 81 mg by mouth daily.   Yes Historical Provider, MD  BIOTIN PO Take 1 capsule by mouth daily.   Yes Historical Provider, MD  Ergocalciferol (VITAMIN D2 PO) Take 1 capsule by mouth daily.   Yes Historical Provider, MD  escitalopram (LEXAPRO) 10 MG tablet Take 10 mg by mouth daily. 05/13/14  Yes Historical Provider, MD  felodipine (PLENDIL) 5 MG 24 hr tablet Take 5 mg by mouth daily.   Yes Historical Provider, MD  FLUZONE HIGH-DOSE 0.5 ML SUSY  03/24/14  Yes Historical Provider, MD  indapamide (LOZOL) 2.5 MG tablet Take 2.5 mg by mouth daily.   Yes Historical Provider, MD  levothyroxine (SYNTHROID, LEVOTHROID) 50 MCG tablet Take 50 mcg by mouth daily.    Yes Historical Provider, MD  methenamine (UREX) 1 G tablet Take 1 g by mouth 2 (two) times daily with a meal.   Yes Historical Provider, MD  nystatin-triamcinolone (MYCOLOG II) cream Apply 1 application topically as needed.  08/16/12  Yes Historical Provider, MD   BP 156/55 mmHg  Pulse 55  Temp(Src) 97.7 F (36.5 C) (Oral)  Resp 18  SpO2 98% Physical Exam  Constitutional: She is oriented to person, place, and time. She appears well-developed and well-nourished. No distress.  HENT:  Head: Normocephalic and atraumatic.  Right Ear: External ear normal.  Left Ear: External ear normal.  Nose: Nose normal.  Mouth/Throat: Oropharynx is clear and moist. No oropharyngeal exudate.  Eyes: Conjunctivae and EOM are normal. Pupils are equal, round, and reactive to light. Right eye exhibits no discharge. Left eye exhibits no discharge. No scleral icterus.  Neck: Normal range of motion. Neck supple. No JVD present. No tracheal deviation present. No thyromegaly present.  Cardiovascular: Normal rate, regular rhythm and intact distal pulses.   Pulmonary/Chest: Effort normal and breath sounds normal. No stridor. No respiratory distress. She has no wheezes. She has no rales. She exhibits no tenderness.  Abdominal: Soft. She exhibits no distension.  Musculoskeletal: Normal range of motion. She exhibits no edema or tenderness.  Lymphadenopathy:    She has no cervical adenopathy.  Neurological: She is alert and oriented to person, place, and time. She has normal strength. She displays no atrophy and no tremor. No cranial nerve deficit or sensory deficit. She exhibits normal muscle tone. She displays no seizure activity. Coordination and gait normal. GCS eye subscore is 4. GCS verbal subscore is 5. GCS motor subscore is 6.  Pt feels dizzy when eyes closed for romberg testing.  Negative Dix-Hallpike and HINTS exam.  Skin: Skin is warm and dry. No rash noted. She is not diaphoretic. No erythema. No pallor.   Psychiatric: She has a normal mood and affect. Her behavior is normal. Judgment and thought content normal.  Nursing note and vitals reviewed.   ED Course  Procedures (including critical care time) Labs Review Labs Reviewed  CBC - Abnormal; Notable for the following:    Hemoglobin 15.9 (*)    HCT 46.9 (*)    All other components within normal limits  URINALYSIS, ROUTINE W REFLEX MICROSCOPIC (NOT AT [facility]) - Abnormal; Notable for the following:    Leukocytes, UA TRACE (*)    All other components within normal limits  URINE MICROSCOPIC-ADD ON - Abnormal; Notable for the following:    Squamous Epithelial / LPF FEW (*)    Bacteria, UA FEW (*)    All other components within normal limits  I-STAT CHEM 8, ED - Abnormal; Notable for the following:    Hemoglobin 16.7 (*)    HCT 49.0 (*)    All  other components within normal limits  PROTIME-INR  APTT  DIFFERENTIAL  COMPREHENSIVE METABOLIC PANEL  I-STAT TROPOININ, ED   EKG: NSR, non-specific ST changes, but appears more likely to be artifact vs isolated PVC, otherwise similar to prior tracings. MDM   Final diagnoses:  Transient cerebral ischemia, unspecified transient cerebral ischemia type    Pt endorsing feeling unbalanced when walking.  Was undergoing PT for L knee arthritis.  Pt was told that her face looked like it was drooping on the left side today.  Last normal was yesterday, when she started feeling dizzy.  Symptoms and exam do not appear to correlate with peripheral cause.  Would be concerned for posterior ischemia.  No other ROS c/w likely infectious or metabolic cause.  Will screen for lab abnormalties.  UTI appears unlikely, will screen with UA for dehydration.  Other than possible hemoconcentration pt has no other significant abnormalities on lab w/u.  MR imaging pending.  Concern for TIA, as symptoms have improved while she has been in the ED but presentation includes multiple symptoms consistent with cerebral  ischemia.  Pt care turned over to Dr. Sibyl Parr, with imaging results pending.  Complete history and physical exam have been communicated.  Please refer to his note for the remainder of ED care.  Patient care was discussed with my attending, Dr. Patria Mane.     Gavin Pound, MD 04/18/15 1710  Azalia Bilis, MD 04/24/15 [phone]

## 2015-04-18 NOTE — ED Provider Notes (Signed)
I assumed care of LINDE WILENSKY from Dr. Celene Kras at their end-of-shift check-out. Patient is a 81 y.o. female who presented for evaluation of Dizziness  I have reviewed their documentation of the patient's HPI, ROS, Physical Exam.  Please refer to their MDM and documentation for course prior to now. Treatments, Labs, and Imaging personally viewed by myself & considered in my MDM.  BP 136/66 mmHg  Pulse 55  Temp(Src) 97.7 F (36.5 C) (Oral)  Resp 15  SpO2 97% Results for orders placed or performed during the hospital encounter of 04/18/15  Protime-INR  Result Value Ref Range   Prothrombin Time 13.7 11.6 - 15.2 seconds   INR 1.03 0.00 - 1.49  APTT  Result Value Ref Range   aPTT 26 24 - 37 seconds  CBC  Result Value Ref Range   WBC 6.7 4.0 - 10.5 K/uL   RBC 4.75 3.87 - 5.11 MIL/uL   Hemoglobin 15.9 (H) 12.0 - 15.0 g/dL   HCT 40.9 (H) 81.1 - 91.4 %   MCV 98.7 78.0 - 100.0 fL   MCH 33.5 26.0 - 34.0 pg   MCHC 33.9 30.0 - 36.0 g/dL   RDW 78.2 95.6 - 21.3 %   Platelets 225 150 - 400 K/uL  Differential  Result Value Ref Range   Neutrophils Relative % 52 %   Neutro Abs 3.6 1.7 - 7.7 K/uL   Lymphocytes Relative 34 %   Lymphs Abs 2.3 0.7 - 4.0 K/uL   Monocytes Relative 10 %   Monocytes Absolute 0.7 0.1 - 1.0 K/uL   Eosinophils Relative 3 %   Eosinophils Absolute 0.2 0.0 - 0.7 K/uL   Basophils Relative 1 %   Basophils Absolute 0.0 0.0 - 0.1 K/uL  Comprehensive metabolic panel  Result Value Ref Range   Sodium 140 135 - 145 mmol/L   Potassium 4.1 3.5 - 5.1 mmol/L   Chloride 103 101 - 111 mmol/L   CO2 29 22 - 32 mmol/L   Glucose, Bld 94 65 - 99 mg/dL   BUN 15 6 - 20 mg/dL   Creatinine, Ser 0.86 0.44 - 1.00 mg/dL   Calcium 9.5 8.9 - 57.8 mg/dL   Total Protein 6.9 6.5 - 8.1 g/dL   Albumin 3.8 3.5 - 5.0 g/dL   AST 25 15 - 41 U/L   ALT 23 14 - 54 U/L   Alkaline Phosphatase 48 38 - 126 U/L   Total Bilirubin 0.9 0.3 - 1.2 mg/dL   GFR calc non Af Amer >60 >60 mL/min   GFR  calc Af Amer >60 >60 mL/min   Anion gap 8 5 - 15  Urinalysis, Routine w reflex microscopic (not at [facility])  Result Value Ref Range   Color, Urine YELLOW YELLOW   APPearance CLEAR CLEAR   Specific Gravity, Urine 1.011 1.005 - 1.030   pH 7.5 5.0 - 8.0   Glucose, UA NEGATIVE NEGATIVE mg/dL   Hgb urine dipstick NEGATIVE NEGATIVE   Bilirubin Urine NEGATIVE NEGATIVE   Ketones, ur NEGATIVE NEGATIVE mg/dL   Protein, ur NEGATIVE NEGATIVE mg/dL   Urobilinogen, UA 0.2 0.0 - 1.0 mg/dL   Nitrite NEGATIVE NEGATIVE   Leukocytes, UA TRACE (A) NEGATIVE  Urine microscopic-add on  Result Value Ref Range   Squamous Epithelial / LPF FEW (A) RARE   WBC, UA 0-2 <3 WBC/hpf   Bacteria, UA FEW (A) RARE  I-Stat Chem 8, ED  (not at [facility], [facility])  Result Value Ref Range   Sodium 141  135 - 145 mmol/L   Potassium 4.1 3.5 - 5.1 mmol/L   Chloride 101 101 - 111 mmol/L   BUN 18 6 - 20 mg/dL   Creatinine, Ser 1.610.70 0.44 - 1.00 mg/dL   Glucose, Bld 90 65 - 99 mg/dL   Calcium, Ion 0.961.17 0.451.13 - 1.30 mmol/L   TCO2 28 0 - 100 mmol/L   Hemoglobin 16.7 (H) 12.0 - 15.0 g/dL   HCT 40.949.0 (H) 81.136.0 - 91.446.0 %  I-stat troponin, ED ([facility])  Result Value Ref Range   Troponin i, poc 0.00 0.00 - 0.08 ng/mL   Comment 3           No results found.  Upon assumption of the patient's care, they require MRI brain and admission for TIA workup. Symptoms resolved at this time. Per patient last normal yesterday and had right sided facial droop at physical therapy today and slurred speech. All labs otherwise unremarkable per Dr. Celene KrasBrooten. + Romberg testing with negative d[facility]. No prior hx of TIA or CVA. On ASA 81 mg.  ED course update, reveals MRI w/o brain reveals no ischemia. I obtained consultation with the Neurology and Internal medicine service for concerns of TIA vs CVA. I discussed the patients clinical course including their H&P, as well as, their diagnostic studies. Based upon that discussion, we've decided that the  patient requires admission and observation for further workup of Neurologic symptoms.   Clinical Impression:  1. Transient cerebral ischemia, unspecified transient cerebral ischemia type    Disposition:  Admission to Hospitalist.  Patient care discussed with Dr. Corlis LeakMacKuen, who oversaw their evaluation & treatment & voiced agreement. House Officer: Jonette EvaBrad Bricelyn Freestone, MD, Emergency Medicine.  Jonette EvaBrad Quinteria Chisum, MD 04/18/15 1910  Courteney Randall AnLyn Mackuen, MD 04/20/15 2215

## 2015-04-18 NOTE — ED Notes (Signed)
MD at bedside. 

## 2015-04-18 NOTE — ED Notes (Signed)
Pt here for feeling " out of it" for the past few days and slight dizziness. sts she cant focus. Denies pain.

## 2015-04-18 NOTE — ED Notes (Signed)
MRI called, they should be to get pt in the next 35 minutes. Pt notified

## 2015-04-18 NOTE — Consult Note (Addendum)
Stroke Consult Consulting Physician: Dr Corlis LeakMackuen ED  Chief Complaint:  HPI: Autumn Allen is an 81 y.o. female hx of HTN, HLD, OSA presenting with episode of dizziness, dysarthria and reported left facial droop. Dizziness described as sensation of being on a boat. Denies any other weakness, sensory, visual or speech deficits. Reports symptoms began yesterday, persisted to today but are not fully resolved. Denies ay prior CVA or TIA. On ASA 81mg  daily at home.   MRI brain imaging reviewed, shows no acute infarct.   Date last known well: 04/17/2015 Time last known well: unclear tPA Given:no, outside tPA window and symptoms have resolved Modified Rankin: Rankin Score=1  Past Medical History  Diagnosis Date  . Hypertension   . Hypothyroid   . Hyperlipemia   . Sleep apnea   . Depression   . Chronic cystitis   . Plantar fasciitis   . S/P TAH-BSO   . S/P cholecystectomy   . Hx of cardiovascular stress test     a. ETT-Echo 3/12:  EF 60%, normal study  . OSA on CPAP   . Hx of echocardiogram     a. Echo 2/14:  Mild LVH, EF 60-65%, Gr 1 diast dysfn, mild to mod AS, mean 17 mmHg, AVA 1.3 (VTI), trivial MR, mild LAE, PASP 31   . Aortic stenosis     a. mild to mod by Echo 07/2012  . Palpitations     a. event monitor 3/14:  NSR, sinus brady  . Allergy   . Arthritis   . Gait disturbance 06/10/2014    Past Surgical History  Procedure Laterality Date  . Cholecystectomy    . Abdominal hysterectomy    . Incontinence surgery    . Tonsillectomy      Family History  Problem Relation Age of Onset  . Heart attack Mother [id number]  . Heart failure Mother   . Lung disease Father   . Cancer - Prostate Father   . Healthy Brother   . Healthy Brother    Social History:  reports that she has quit smoking. She has never used smokeless tobacco. She reports that she does not drink alcohol or use illicit drugs.  Allergies:  Allergies  Allergen Reactions  . Metronidazole And Related     unknown   . Ciprofloxacin Other (See Comments)    Reaction unknown  . Etodolac Other (See Comments)    Reaction unknown  . Naproxen Other (See Comments)    Reaction unknown  . Nitrofurantoin Other (See Comments)    Reaction unknown  . Sulfonamide Derivatives Other (See Comments)    Reaction unknown  . Alprazolam Rash  . Epinephrine Palpitations  . Macrobid [Nitrofurantoin Macrocrystal] Rash     (Not in a hospital admission)  ROS: Out of a complete 14 system review, the patient complains of only the following symptoms, and all other reviewed systems are negative. + gait instability  Physical Examination: Filed Vitals:   04/18/15 1845  BP: 161/52  Pulse: 57  Temp:   Resp:    Physical Exam  Constitutional: He appears well-developed and well-nourished.  Psych: Affect appropriate to situation Eyes: No scleral injection HENT: No OP obstrucion Head: Normocephalic.  Cardiovascular: Normal rate and regular rhythm.  Respiratory: Effort normal and breath sounds normal.  GI: Soft. Bowel sounds are normal. No distension. There is no tenderness.  Skin: WDI   Neurologic Examination: Mental Status: Alert, oriented, thought content appropriate.  Speech fluent without evidence of aphasia. No dysarthria. Able to follow  3 step commands without difficulty. Cranial Nerves: II: funduscopic exam wnl bilaterally, visual fields grossly normal, pupils equal, round, reactive to light and accommodation III,IV, VI: ptosis not present, extra-ocular motions intact bilaterally V,VII: smile symmetric, facial light touch sensation normal bilaterally VIII: hearing normal bilaterally IX,X: gag reflex present XI: trapezius strength/neck flexion strength normal bilaterally XII: tongue strength normal  Motor: Right : Upper extremity    Left:     Upper extremity 5/5 deltoid       5/5 deltoid 5/5 biceps      5/5 biceps  5/5 triceps      5/5 triceps 5/5 hand grip      5/5 hand grip  Lower  extremity     Lower extremity 5-/5 hip flexor      5-/5 hip flexor 5-/5 quadricep      5-/5 quadriceps  5-/5 hamstrings     5-/5 hamstrings 5/5 plantar flexion       5/5 plantar flexion 5/5 plantar extension     5/5 plantar extension Tone and bulk:normal tone throughout; no atrophy noted Sensory: Pinprick and light touch intact throughout, bilaterally Deep Tendon Reflexes: 1+ and symmetric throughout Plantars: Right: downgoing   Left: downgoing Cerebellar: normal finger-to-nose, and normal heel-to-shin test Gait: normal gait and station  Laboratory Studies:   Basic Metabolic Panel:  Recent Labs Lab 04/18/15 1349 04/18/15 1400  NA 140 141  K 4.1 4.1  CL 103 101  CO2 29  --   GLUCOSE 94 90  BUN 15 18  CREATININE 0.82 0.70  CALCIUM 9.5  --     Liver Function Tests:  Recent Labs Lab 04/18/15 1349  AST 25  ALT 23  ALKPHOS 48  BILITOT 0.9  PROT 6.9  ALBUMIN 3.8   No results for input(s): LIPASE, AMYLASE in the last 168 hours. No results for input(s): AMMONIA in the last 168 hours.  CBC:  Recent Labs Lab 04/18/15 1349 04/18/15 1400  WBC 6.7  --   NEUTROABS 3.6  --   HGB 15.9* 16.7*  HCT 46.9* 49.0*  MCV 98.7  --   PLT 225  --     Cardiac Enzymes: No results for input(s): CKTOTAL, CKMB, CKMBINDEX, TROPONINI in the last 168 hours.  BNP: Invalid input(s): POCBNP  CBG: No results for input(s): GLUCAP in the last 168 hours.  Microbiology: Results for orders placed or performed during the hospital encounter of 04/29/12  Urine culture     Status: None   Collection Time: 04/29/12  9:57 PM  Result Value Ref Range Status   Specimen Description URINE, CLEAN CATCH  Final   Special Requests NONE  Final   Culture  Setup Time 04/30/2012 13:26  Final   Colony Count 75,000 COLONIES/ML  Final   Culture   Final    STAPHYLOCOCCUS SPECIES (COAGULASE NEGATIVE) Note: RIFAMPIN AND GENTAMICIN SHOULD NOT BE USED AS SINGLE DRUGS FOR TREATMENT OF STAPH INFECTIONS.    Report Status 05/02/2012 FINAL  Final   Organism ID, Bacteria STAPHYLOCOCCUS SPECIES (COAGULASE NEGATIVE)  Final      Susceptibility   Staphylococcus species (coagulase negative) - MIC*    GENTAMICIN <=0.5 Sensitive     LEVOFLOXACIN <=0.12 Sensitive     NITROFURANTOIN <=16 Sensitive     OXACILLIN <=0.25 Sensitive     PENICILLIN >=0.5 Resistant     RIFAMPIN <=0.5 Sensitive     VANCOMYCIN 1 Sensitive     TETRACYCLINE 2 Sensitive     * STAPHYLOCOCCUS SPECIES (COAGULASE NEGATIVE)  Coagulation Studies:  Recent Labs  04/18/15 1349  LABPROT 13.7  INR 1.03    Urinalysis:  Recent Labs Lab 04/18/15 1353  COLORURINE YELLOW  LABSPEC 1.011  PHURINE 7.5  GLUCOSEU NEGATIVE  HGBUR NEGATIVE  BILIRUBINUR NEGATIVE  KETONESUR NEGATIVE  PROTEINUR NEGATIVE  UROBILINOGEN 0.2  NITRITE NEGATIVE  LEUKOCYTESUR TRACE*    Lipid Panel:     Component Value Date/Time   CHOL 215* 08/26/2010 0844   TRIG 189.0* 08/26/2010 0844   HDL 43.80 08/26/2010 0844   CHOLHDL 5 08/26/2010 0844   VLDL 37.8 08/26/2010 0844    HgbA1C: No results found for: HGBA1C  Urine Drug Screen:  No results found for: LABOPIA, COCAINSCRNUR, LABBENZ, AMPHETMU, THCU, LABBARB  Alcohol Level: No results for input(s): ETH in the last 168 hours.  Other results:  Imaging: [name] Contrast  04/18/2015  CLINICAL DATA:  81 year old female with dizziness and loss of focus for several days. Initial encounter. EXAM: MRI HEAD WITHOUT CONTRAST TECHNIQUE: Multiplanar, multiecho pulse sequences of the brain and surrounding structures were obtained without intravenous contrast. COMPARISON:  Brain MRI 05/11/2014. FINDINGS: Major intracranial vascular flow voids are stable. Cerebral volume is stable and within normal limits for age. No restricted diffusion to suggest acute infarction. No midline shift, mass effect, evidence of mass lesion, ventriculomegaly, extra-axial collection or acute intracranial hemorrhage. Cervicomedullary  junction and pituitary are within normal limits. Negative visualized cervical spine. Stable gray and white matter signal throughout the brain. Mild for age nonspecific cerebral white matter signal changes. No cortical encephalomalacia or chronic cerebral blood products. Visible internal auditory structures appear normal. Trace right mastoid fluid is unchanged. Negative nasopharynx. Mild ethmoid and frontal sinus mucosal thickening also is stable. Other Visualized paranasal sinuses and mastoids are clear. Negative orbit and scalp soft tissues. IMPRESSION: No acute intracranial abnormality. Stable noncontrast MRI appearance of the brain since 2015, with mild for age white matter signal changes. Electronically Signed   By: Odessa Fleming M.D.   On: 04/18/2015 17:33    Assessment: 81 y.o. female hx of HTN, HLD, OSA admitted with transient episode of dizziness, dysarthria. Currently asymptomatic. MRI brain reviewed, shows no acute stroke. Admitted for TIA workup.   Plan: 1. HgbA1c, fasting lipid panel 2. MRA  of the brain without contrast 3. PT consult, OT consult, Speech consult 4. Echocardiogram 5. Carotid dopplers 6. Prophylactic therapy-ASA  daily 7. Risk factor modification 8. Telemetry monitoring 9. Frequent neuro checks 10. NPO until RN stroke swallow screen  Elspeth Cho, DO [facility] [phone]  If 7pm- 7am, please page neurology on call as listed in AMION. 04/18/2015, 7:13 PM

## 2015-04-18 NOTE — ED Notes (Signed)
Pt ambulated to  the bathroom with cane

## 2015-04-18 NOTE — ED Notes (Signed)
Patient transported to MRI 

## 2015-04-18 NOTE — H&P (Signed)
Date: 04/18/2015               Patient Name:  Autumn Allen MRN: [id number]  DOB: September 03, 1929 Age / Sex: 81 y.o., female   PCP: Autumn Paradise, MD         Medical Service: Internal Medicine Teaching Service         Attending Physician: Dr. Heide Allen    First Contact: Dr. Dimple Allen Pager: [phone]  Second Contact: Dr. Beckie Allen Pager: [phone]       After Hours (After 5p/  First Contact Pager: [phone]  weekends / holidays): Second Contact Pager: [phone]   Chief Complaint: Right facial weakness, slurred speech, dizziness  History of Present Illness: [name] is a 81yo woman with PMHx of HTN, hyperlipidemia, OSA, and hypothyroidism who presents to the ED for dizziness, dysarthria, and possibly left facial drooping first observed this morning and now improving after evaluation at ED. Her symptoms started with dizziness and a sense of "rocking like on a boat" yesterday. She was then undergoing PT for her chronic osteoarthritis of the knee and plantar fasciitis this morning when her therapist observed she was slurring speech and had a facial droop. She also reported that after this she had difficulty with some word finding or speaking including naming the nurse she was working with.  Since arrival at the ED a brain MRI was obtained showing no acute changes and mild for age white matter changes. She is no longer symptomatic of her symptoms and has a normal gait limited by walking with a cane for advanced left knee OA. Initial laboratory studies including Bmet, CBC with diff, UA, PT/INR were unremarkable.  She has previously been seen by neurology outpatient for gait instability and previous MRI obtained demonstrating possible white matter microvascular ischemic changes without gross atrophy, hydrocephalus, or focal infarcts. She has previously been evaluated by cardiology for heart palpitations in 2014 without significant findings.  Meds: No current facility-administered medications for this  encounter.   Current Outpatient Prescriptions  Medication Sig Dispense Refill  . aspirin EC 81 MG tablet Take 81 mg by mouth daily.    Marland Kitchen BIOTIN PO Take 1 capsule by mouth daily.    . Ergocalciferol (VITAMIN D2 PO) Take 1 capsule by mouth daily.    Marland Kitchen escitalopram (LEXAPRO) 10 MG tablet Take 10 mg by mouth daily.    . felodipine (PLENDIL) 5 MG 24 hr tablet Take 5 mg by mouth daily.    Marland Kitchen FLUZONE HIGH-DOSE 0.5 ML SUSY     . indapamide (LOZOL) 2.5 MG tablet Take 2.5 mg by mouth daily.    Marland Kitchen levothyroxine (SYNTHROID, LEVOTHROID) 50 MCG tablet Take 50 mcg by mouth daily.    . methenamine (UREX) 1 G tablet Take 1 g by mouth 2 (two) times daily with a meal.    . nystatin-triamcinolone (MYCOLOG II) cream Apply 1 application topically as needed.       Allergies: Allergies as of 04/18/2015 - Review Complete 04/18/2015  Allergen Reaction Noted  . Metronidazole and related  06/10/2014  . Ciprofloxacin Other (See Comments)   . Etodolac Other (See Comments)   . Naproxen Other (See Comments)   . Nitrofurantoin Other (See Comments)   . Sulfonamide derivatives Other (See Comments)   . Alprazolam Rash 08/03/2012  . Epinephrine Palpitations 08/03/2012  . Macrobid [nitrofurantoin macrocrystal] Rash 08/03/2012   Past Medical History  Diagnosis Date  . Hypertension   . Hypothyroid   . Hyperlipemia   . Sleep apnea   .  Depression   . Chronic cystitis   . Plantar fasciitis   . S/P TAH-BSO   . S/P cholecystectomy   . Hx of cardiovascular stress test     a. ETT-Echo 3/12:  EF 60%, normal study  . OSA on CPAP   . Hx of echocardiogram     a. Echo 2/14:  Mild LVH, EF 60-65%, Gr 1 diast dysfn, mild to mod AS, mean 17 mmHg, AVA 1.3 (VTI), trivial MR, mild LAE, PASP 31   . Aortic stenosis     a. mild to mod by Echo 07/2012  . Palpitations     a. event monitor 3/14:  NSR, sinus brady  . Allergy   . Arthritis   . Gait disturbance 06/10/2014   Past Surgical History  Procedure Laterality Date  .  Cholecystectomy    . Abdominal hysterectomy    . Incontinence surgery    . Tonsillectomy     Family History  Problem Relation Age of Onset  . Heart attack Mother 43  . Heart failure Mother   . Lung disease Father   . Cancer - Prostate Father   . Healthy Brother   . Healthy Brother    Social History   Social History  . Marital Status: Widowed    Spouse Name: N/A  . Number of Children: 2  . Years of Education: college gr   Occupational History  . retired Runner, broadcasting/film/video    Social History Main Topics  . Smoking status: Former Games developer  . Smokeless tobacco: Never Used  . Alcohol Use: No  . Drug Use: No  . Sexual Activity: Not on file   Other Topics Concern  . Not on file   Social History Narrative   Patient is right handed. Patient drinks decaf coffee.   Patient lives alone at [facility].    Review of Systems: Review of Systems  Constitutional: Negative for fever.  HENT: Negative for tinnitus.   Eyes: Negative for blurred vision.  Genitourinary: Negative for dysuria.  Musculoskeletal: Positive for joint pain. Negative for falls.  Neurological: Positive for dizziness. Negative for weakness and headaches.   Physical Exam: Blood pressure 164/85, pulse 63, temperature 98.3 F (36.8 C), temperature source Oral, resp. rate 18, SpO2 95 %.  GENERAL- alert, co-operative, NAD HEENT- Atraumatic, PERRL, EOMI, oral mucosa appears moist, no carotid bruit, no cervical LN enlargement. CARDIAC- RRR, early-mid systolic murmur loudest over LUSB RESP- CTAB, no wheezes or crackles. ABDOMEN- Soft, nontender, no guarding or rebound, normoactive bowel sounds present NEURO- No obvious Cr N abnormality, strength upper and lower extremities- 5/5, Sensation intact- globally, Gait- mild antalgic gait favoring right leg with cane use. EXTREMITIES- symmetric, no pedal edema, pulses difficult to palpate, normal capillary refill. SKIN- Warm, dry, No rash or lesion. PSYCH- Normal  mood and affect, appropriate thought content and speech.  Lab results: Basic Metabolic Panel:  Recent Labs  16/10/96 1349 04/18/15 1400  NA 140 141  K 4.1 4.1  CL 103 101  CO2 29  --   GLUCOSE 94 90  BUN 15 18  CREATININE 0.82 0.70  CALCIUM 9.5  --    Liver Function Tests:  Recent Labs  04/18/15 1349  AST 25  ALT 23  ALKPHOS 48  BILITOT 0.9  PROT 6.9  ALBUMIN 3.8   No results for input(s): LIPASE, AMYLASE in the last 72 hours. No results for input(s): AMMONIA in the last 72 hours. CBC:  Recent Labs  04/18/15 1349 04/18/15 1400  WBC 6.7  --   NEUTROABS 3.6  --   HGB 15.9* 16.7*  HCT 46.9* 49.0*  MCV 98.7  --   PLT 225  --    Cardiac Enzymes: No results for input(s): CKTOTAL, CKMB, CKMBINDEX, TROPONINI in the last 72 hours. BNP: No results for input(s): PROBNP in the last 72 hours. D-Dimer: No results for input(s): DDIMER in the last 72 hours. CBG: No results for input(s): GLUCAP in the last 72 hours. Hemoglobin A1C: No results for input(s): HGBA1C in the last 72 hours. Fasting Lipid Panel: No results for input(s): CHOL, HDL, LDLCALC, TRIG, CHOLHDL, LDLDIRECT in the last 72 hours. Thyroid Function Tests: No results for input(s): TSH, T4TOTAL, FREET4, T3FREE, THYROIDAB in the last 72 hours. Anemia Panel: No results for input(s): VITAMINB12, FOLATE, FERRITIN, TIBC, IRON, RETICCTPCT in the last 72 hours. Coagulation:  Recent Labs  04/18/15 1349  LABPROT 13.7  INR 1.03   Urine Drug Screen: Drugs of Abuse  No results found for: LABOPIA, COCAINSCRNUR, LABBENZ, AMPHETMU, THCU, LABBARB  Alcohol Level: No results for input(s): ETH in the last 72 hours. Urinalysis:  Recent Labs  04/18/15 1353  COLORURINE YELLOW  LABSPEC 1.011  PHURINE 7.5  GLUCOSEU NEGATIVE  HGBUR NEGATIVE  BILIRUBINUR NEGATIVE  KETONESUR NEGATIVE  PROTEINUR NEGATIVE  UROBILINOGEN 0.2  NITRITE NEGATIVE  LEUKOCYTESUR TRACE*   Imaging results:  Mr Sherrin Daisy  Contrast  04/18/2015  CLINICAL DATA:  81 year old female with dizziness and loss of focus for several days. Initial encounter. EXAM: MRI HEAD WITHOUT CONTRAST TECHNIQUE: Multiplanar, multiecho pulse sequences of the brain and surrounding structures were obtained without intravenous contrast. COMPARISON:  Brain MRI 05/11/2014. FINDINGS: Major intracranial vascular flow voids are stable. Cerebral volume is stable and within normal limits for age. No restricted diffusion to suggest acute infarction. No midline shift, mass effect, evidence of mass lesion, ventriculomegaly, extra-axial collection or acute intracranial hemorrhage. Cervicomedullary junction and pituitary are within normal limits. Negative visualized cervical spine. Stable gray and white matter signal throughout the brain. Mild for age nonspecific cerebral white matter signal changes. No cortical encephalomalacia or chronic cerebral blood products. Visible internal auditory structures appear normal. Trace right mastoid fluid is unchanged. Negative nasopharynx. Mild ethmoid and frontal sinus mucosal thickening also is stable. Other Visualized paranasal sinuses and mastoids are clear. Negative orbit and scalp soft tissues. IMPRESSION: No acute intracranial abnormality. Stable noncontrast MRI appearance of the brain since 2015, with mild for age white matter signal changes. Electronically Signed   By: Odessa Fleming M.D.   On: 04/18/2015 17:33    Other results: EKG: NSR, possible LA enlargement  Assessment & Plan by Problem: TIA (Transient Ischemic Attack) - History of symptoms including dizziness, dysarthria, and facial drooping is very consistent with ischemia involving CN 7-8 or maybe posterior cerebral circulation. MRI without acute changes and resolution of most symptoms suggests transient ischemia rather than CVA. MRI in 2015 per Dr. Jacky Kindle demonstrated some white matter microvascular changes. EEG obtained did not show any ongoing epileptiform activity  or focal findings. She does have a history of heart palpitations previously worked in 2014 for this, with findings only for symptomatic PACs of no hemodynamic significance. -ASA  -Carotid doppler US -TTE -Neurology recs appreciated -Hgb A1c -Lipid panel -SLP eval and treat -PT/OT  Hypertension - blood pressure controlled on diuretic, ACE-I outpatient but currently is 120s-160s in setting of maybe TIA. Hold additional medications at this time, can restart as needed if BP control worsens.  Hypothyroidism - synthroid  50mcg  Diet: Heart healthy DVT ppx: [place] enoxaparin FULL CODE  Dispo: Disposition is deferred at this time, awaiting improvement of current medical problems. Anticipated discharge in approximately 2 day(s).   The patient does have a current PCP Autumn Allen(Richard Aronson, MD) and [facility] after discharge.  The patient does not know have transportation limitations that hinder transportation to clinic appointments.  Signed: Fuller Planhristopher W Walton Digilio, MD 04/18/2015, 8:03 PM

## 2015-04-19 ENCOUNTER — Inpatient Hospital Stay (HOSPITAL_COMMUNITY): Payer: Medicare Other

## 2015-04-19 DIAGNOSIS — E039 Hypothyroidism, unspecified: Secondary | ICD-10-CM

## 2015-04-19 DIAGNOSIS — G459 Transient cerebral ischemic attack, unspecified: Secondary | ICD-10-CM

## 2015-04-19 DIAGNOSIS — F329 Major depressive disorder, single episode, unspecified: Secondary | ICD-10-CM

## 2015-04-19 DIAGNOSIS — G451 Carotid artery syndrome (hemispheric): Secondary | ICD-10-CM

## 2015-04-19 DIAGNOSIS — E785 Hyperlipidemia, unspecified: Secondary | ICD-10-CM

## 2015-04-19 DIAGNOSIS — I10 Essential (primary) hypertension: Secondary | ICD-10-CM

## 2015-04-19 LAB — LIPID PANEL
CHOL/HDL RATIO: 5.5 ratio
CHOLESTEROL: 218 mg/dL — AB (ref 0–200)
HDL: 40 mg/dL — AB (ref >40)
LDL Cholesterol: 133 mg/dL — ABNORMAL HIGH (ref 0–99)
Triglycerides: 224 mg/dL — ABNORMAL HIGH (ref <150)
VLDL: 45 mg/dL — AB (ref 0–40)

## 2015-04-19 MED ORDER — PANTOPRAZOLE SODIUM 40 MG IV SOLR: 40.0000 mg | INTRAVENOUS | Status: DC

## 2015-04-19 MED ORDER — PANTOPRAZOLE SODIUM 40 MG PO TBEC
40.0000 mg | DELAYED_RELEASE_TABLET | Freq: Every day | ORAL | Status: DC
  Administered 2015-04-19 – 2015-04-20 (×2): 40 mg via ORAL
  Filled 2015-04-19 (×2): qty 1

## 2015-04-19 MED ORDER — METHENAMINE HIPPURATE 1 G PO TABS: 1.0000 g | ORAL_TABLET | Freq: Two times a day (BID) | ORAL | Status: DC

## 2015-04-19 MED ORDER — ATORVASTATIN CALCIUM 40 MG PO TABS
40.0000 mg | ORAL_TABLET | Freq: Every day | ORAL | Status: DC
Start: 2015-04-19 — End: 2015-04-20
  Administered 2015-04-19: 40 mg via ORAL
  Filled 2015-04-19: qty 1

## 2015-04-19 NOTE — Progress Notes (Signed)
*  PRELIMINARY RESULTS* Vascular Ultrasound Carotid Duplex (Doppler) has been completed.   Findings suggest 1-39% internal carotid artery stenosis bilaterally. Vertebral arteries are patent with antegrade flow.  04/19/2015 10:41 AM Gertie FeyMichelle Fadumo Heng, RVT, RDCS, RDMS

## 2015-04-19 NOTE — Progress Notes (Signed)
Patient received from the ER at 2130, alert and oriented. Patient MAEW, VSS. Initial NIHSS 0. Will monitor closely overnight.

## 2015-04-19 NOTE — Progress Notes (Signed)
Patient seen and examined. Case d/w residents in detail.  HPI: 81 y/o female with PMH of HTN, HLD, OSA and hypothyroidism p/w dysarthria, left facial droop and unsteady gait. Patient states that symptoms started with dizziness and unsteady gait. She was noted to have slurred speech and left facial droop by PT and was referred to ED for further eval. No CP, no sob, no palpitations, no abd pain, no fevers, no n/v, no diarrhea. Symptoms resolved in ED.  Physical Exam: Gen: AAO*3, NAD CVS: RRR, normal heart sounds Lungs: CTA b/l Abd: soft, non tender, BS + Ext: no edema Neuro: Power 5/5 b/l UE, LE, sensation intact  Assessment and Plan:  TIA: - Patient with symptoms characteristic of TIA and MRI with no acute changes - Carotid dopplers with 1-39% stenosis bilaterally - f/u 2 D ECHO - c/w asa 81 mg - Will need to start statin - resume BP meds - likely d/c home in AM

## 2015-04-19 NOTE — Progress Notes (Signed)
Subjective: No acute events overnight. She denies any dizziness, changes in vision, weakness, or changes in speech. She feels her symptoms have completely resolved.   Objective: Vital signs in last 24 hours: Filed Vitals:   04/19/15 0200 04/19/15 0400 04/19/15 0600 04/19/15 0800  BP: 143/56 142/59 144/49 160/47  Pulse: 50 53 50 55  Temp:   98.1 F (36.7 C) 98.5 F (36.9 C)  TempSrc:   Oral Oral  Resp: Height:      Weight:      SpO2: 95% 94% 94% 95%   Weight change:  No intake or output data in the 24 hours ending 04/19/15 1248  Physical Exam General: alert, sitting up, NAD HEENT: [place]/AT, EOMI, PERRL, mucus membranes moist CV: RRR, no m/g/r Pulm: CTA bilaterally, breaths non-labored Abd: BS+, soft, non-tender Ext: warm, no peripheral edema Neuro: alert and oriented x 3. EOMI, PERRL. Smile symmetric. Shoulder shrug intact. Tongue midline. Strength intact in upper and lower extremities bilaterally. Sensation intact.   Lab Results: Basic Metabolic Panel:  Recent Labs Lab 04/18/15 1349 04/18/15 1400  NA 140 141  K 4.1 4.1  CL 103 101  CO2 29  --   GLUCOSE 94 90  BUN 15 18  CREATININE 0.82 0.70  CALCIUM 9.5  --    Liver Function Tests:  Recent Labs Lab 04/18/15 1349  AST 25  ALT 23  ALKPHOS 48  BILITOT 0.9  PROT 6.9  ALBUMIN 3.8   CBC:  Recent Labs Lab 04/18/15 1349 04/18/15 1400  WBC 6.7  --   NEUTROABS 3.6  --   HGB 15.9* 16.7*  HCT 46.9* 49.0*  MCV 98.7  --   PLT 225  --    Fasting Lipid Panel:  Recent Labs Lab 04/19/15 0615  CHOL 218*  HDL 40*  LDLCALC 133*  TRIG 224*  CHOLHDL 5.5   Coagulation:  Recent Labs Lab 04/18/15 1349  LABPROT 13.7  INR 1.03   Urinalysis:  Recent Labs Lab 04/18/15 1353  COLORURINE YELLOW  LABSPEC 1.011  PHURINE 7.5  GLUCOSEU NEGATIVE  HGBUR NEGATIVE  BILIRUBINUR NEGATIVE  KETONESUR NEGATIVE  PROTEINUR NEGATIVE  UROBILINOGEN 0.2  NITRITE NEGATIVE  LEUKOCYTESUR TRACE*    Studies/Results: [name] Contrast  04/18/2015  CLINICAL DATA:  81 year old female with dizziness and loss of focus for several days. Initial encounter. EXAM: MRI HEAD WITHOUT CONTRAST TECHNIQUE: Multiplanar, multiecho pulse sequences of the brain and surrounding structures were obtained without intravenous contrast. COMPARISON:  Brain MRI 05/11/2014. FINDINGS: Major intracranial vascular flow voids are stable. Cerebral volume is stable and within normal limits for age. No restricted diffusion to suggest acute infarction. No midline shift, mass effect, evidence of mass lesion, ventriculomegaly, extra-axial collection or acute intracranial hemorrhage. Cervicomedullary junction and pituitary are within normal limits. Negative visualized cervical spine. Stable gray and white matter signal throughout the brain. Mild for age nonspecific cerebral white matter signal changes. No cortical encephalomalacia or chronic cerebral blood products. Visible internal auditory structures appear normal. Trace right mastoid fluid is unchanged. Negative nasopharynx. Mild ethmoid and frontal sinus mucosal thickening also is stable. Other Visualized paranasal sinuses and mastoids are clear. Negative orbit and scalp soft tissues. IMPRESSION: No acute intracranial abnormality. Stable noncontrast MRI appearance of the brain since 2015, with mild for age white matter signal changes. Electronically Signed   By: Odessa Fleming M.D.   On: 04/18/2015 17:33   Medications: I have reviewed the patient's current medications. Scheduled Meds: .  stroke: mapping our early stages of recovery book   Does not apply Once  . aspirin EC  81 mg Oral Daily  . atorvastatin  40 mg Oral q1800  . enoxaparin (LOVENOX) injection  40 mg Subcutaneous Q24H  . escitalopram  10 mg Oral Daily  . felodipine  5 mg Oral Daily  . indapamide  2.5 mg Oral Daily  . [START ON 04/20/2015] levothyroxine  50 mcg Oral QAC breakfast  . methenamine  1 g Oral BID WC    Continuous Infusions:  PRN Meds:. Assessment/Plan:  TIA (Transient Ischemic Attack): She presented with symptoms of dizziness, dysarthria, and facial drooping that have all resolved. MRI without acute changes. Her presentation and negative MRI are most consistent with a TIA.  EEG obtained did not show any ongoing epileptiform activity or focal findings. She does have a history of heart palpitations previously worked in 2014 for this, with findings only for symptomatic PACs of no hemodynamic significance. Will continue stroke work up.  - Neurology following, appreciate recommendations  - Continue ASA 81mg  - Obtain carotid doppler US - Obtain TTE - Hgb A1c pending  - Lipid panel>> elevated LDL 133 and HDL low-nml at 40 - SLP/PT/OT consulted, appreciate evaluations  Hypertension: BPs stable in 120s-160s systolic.  - Resume home Felodipine and Indapamide   Hyperlipidemia: Lipid panel shows Chol 218, Trigly 224, HDL 40, and LDL 133. - Start Atorvastatin 40 mg daily   Hypothyroidism: - Continue Synthroid 50 mcg daily   Depression: Symptoms well controlled. - Continue Lexapro 10 mg daily   Diet: Heart healthy VTE PPx: Lovenox SQ Dispo: Disposition is deferred at this time, awaiting improvement of current medical problems.  Anticipated discharge in approximately 1-2 day(s).   The patient does have a current PCP Geoffry Paradise(Richard Aronson, MD) and [facility] after discharge.  The patient does not have transportation limitations that hinder transportation to clinic appointments.  .Services Needed at time of discharge: Y = Yes, Blank = No PT:   OT:   RN:   Equipment:   Other:     LOS: 1 day   Rich Numberarly Rivet, MD, MPH Internal Medicine Resident, PGY-II Pager: [phone]

## 2015-04-19 NOTE — Progress Notes (Signed)
STROKE TEAM PROGRESS NOTE   HISTORY Autumn Allen is an 81 y.o. female hx of HTN, HLD, OSA presenting with episode of dizziness, dysarthria and reported left facial droop. Dizziness described as sensation of being on a boat. Denies any other weakness, sensory, visual or speech deficits. Reported symptoms began the day before admission, and persisted the day of admission but are not fully resolved. Denies ay prior CVA or TIA. On ASA  daily at home.   MRI brain imaging reviewed, shows no acute infarct.   Date last known well: 04/17/2015 Time last known well: unclear tPA Given:no, outside tPA window and symptoms have resolved Modified Rankin: Rankin Score=1      SUBJECTIVE (INTERVAL HISTORY) Her family is not at the bedside.  Overall she feels her condition is completely resolved. She is able to walk to the bathroom without assistance.  She states her appetitie is good and denies complaints for all ROS   OBJECTIVE Temp:  [97.7 F (36.5 C)-98.5 F (36.9 C)] 98.5 F (36.9 C) (11/05 0800) Pulse Rate:  [49-63] 55 (11/05 0800) Cardiac Rhythm:  [-] Sinus bradycardia (11/04 2143) Resp:  [15-21] 16 (11/05 0800) BP: (124-164)/(35-85) 160/47 mmHg (11/05 0800) SpO2:  [92 %-98 %] 95 % (11/05 0800) Weight:  [85.231 kg (187 lb 14.4 oz)] 85.231 kg (187 lb 14.4 oz) (11/04 2144)  CBC:  Recent Labs Lab 04/18/15 1349 04/18/15 1400  WBC 6.7  --   NEUTROABS 3.6  --   HGB 15.9* 16.7*  HCT 46.9* 49.0*  MCV 98.7  --   PLT 225  --     Basic Metabolic Panel:  Recent Labs Lab 04/18/15 1349 04/18/15 1400  NA 140 141  K 4.1 4.1  CL 103 101  CO2 29  --   GLUCOSE 94 90  BUN 15 18  CREATININE 0.82 0.70  CALCIUM 9.5  --     Lipid Panel:    Component Value Date/Time   CHOL 218* 04/19/2015 0615   TRIG 224* 04/19/2015 0615   HDL 40* 04/19/2015 0615   CHOLHDL 5.5 04/19/2015 0615   VLDL 45* 04/19/2015 0615   LDLCALC 133* 04/19/2015 0615   HgbA1c: No results found for: HGBA1C Urine  Drug Screen: No results found for: LABOPIA, COCAINSCRNUR, LABBENZ, AMPHETMU, THCU, LABBARB    IMAGING  [name] Contrast 04/18/2015   No acute intracranial abnormality. Stable noncontrast MRI appearance of the brain since 2015, with mild for age white matter signal changes.    PHYSICAL EXAM Physical Exam General - Well nourished, well developed, in NAD   Cardiovascular - Regular rate and rhythm Pulmonary: CTA Abdomen: NT, ND, normal bowel sounds Extremities: No C/C/E  Neurological Exam Mental Status: Normal Orientation:  Oriented to person, place and time Speech:  Fluent; no dysarthria Cranial Nerves:  PERRL; EOMI; visual fields full, face grossly symmetric, hearing grossly intact; shrug symmetric and tongue midline  Motor Exam:  Tone:  Within normal limits; Strength: 5/5 throughout  Sensory: Intact to light touch throughout  Coordination:  Intact finger to nose  Gait: Within normal limits  ASSESSMENT/PLAN Autumn Allen is a 81 y.o. female with history of hypertension, hyperlipidemia, and obstructive sleep apnea on C Pap,  presenting with transient dizziness, dysarthria, and possible left facial droop.  She did not receive IV t-PA due to resolution of deficits.  Possible TIA:  Non-dominant   Resultant - resolution of deficits.  MRI  No acute intracranial abnormality.  MRA  - not performed  Carotid Doppler - pending  2D Echo - pending  LDL - 133; upset stomach in the past with a statin but cannot remember the name  HgbA1c pending  VTE prophylaxis - Lovenox  Diet Heart Room service appropriate?: Yes; Fluid consistency:: Thin  aspirin 81 mg daily  prior to admission, now on aspirin 81 mg daily  Patient counseled to be compliant with her antithrombotic medications  Ongoing aggressive stroke risk factor management  Therapy recommendations: - Pending  Disposition: Pending  Hypertension  Stable  Permissive hypertension (OK if < 220/120)  but gradually normalize in 5-7 days  Hyperlipidemia  Home meds: No lipid lowering medications prior to admission  LDL 133, goal < 70  Add Lipitor 40 mg daily  Continue statin at discharge  Other Stroke Risk Factors  Advanced age  Cigarette smoker, quit smoking.  Obesity, Body mass index is 31.27 kg/(m^2).   Obstructive sleep apnea, on CPAP at home  Other Active Problems  Mildly elevated hemoglobin and hematocrit  Consider changing aspirin to 325 mg daily   Hospital day # 1  Delton Seeavid Rinehuls PA-C [facility] Pager [phone] 04/19/2015, 9:39 AM  NEUROLOGY ATTENDING NOTE Patient was seen and examined by me personally. I reviewed notes, independently viewed imaging studies, participated in medical decision making and plan of care. I have made additions or clarifications directly to the above note.  Documentation accurately reflects findings. The laboratory and radiographic studies were personally reviewed by me.  ROS completed by me personally and pertinent positives fully documented.  Assessment and plan completed by me personally and fully documented above.  Condition is improved     I spent 30 minutes of consultative time in the care of  this patient.  SIGNED BY: Dr. Sula Sodahere Levy Wellman      To contact Stroke Continuity provider, please refer to [email or web address]. After hours, contact General Neurology

## 2015-04-19 NOTE — Progress Notes (Signed)
Physical Therapy Evaluation Patient Details Name: VETA DAMBROSIA MRN: [id number] DOB: 13-Dec-1929 Today's Date: 04/19/2015   History of Present Illness  81 yo female admitted for TIA due to R facial weakness and slurred speech symptoms.  Receiving PT services for chronic knee OA and plantar facitis at [facility].  Clinical Impression  Pleasant female, without symptoms at time of evaluation.  Patient reports she feels 'almost' 100% again.  Strength is symmetric, ROM is normal, no evidence of fatigue with activity.  Mild balance impairment based on Berg Balance assessment, patient uses cane at baseline, was able to ambulate without device as well.  No skilled need noted for hospital level, patient gets PT services at home for osteoarthritis and plantar fascitis and will benefit from continuation of those services.  Otherwise patient cleared from PT perspective at this time and PT will sign off.  Please re-request evaluation if status changes.     Follow Up Recommendations  (Patient currently receiving PT services at [facility].)    Equipment Recommendations  None recommended by PT    Recommendations for Other Services       Precautions / Restrictions Precautions Precautions: None Restrictions Weight Bearing Restrictions: No      Mobility  Bed Mobility Overal bed mobility: Independent                Transfers Overall transfer level: Modified independent Equipment used: Rolling walker (2 wheeled);None             General transfer comment: No difficulties  Ambulation/Gait Ambulation/Gait assistance: Modified independent (Device/Increase time) Ambulation Distance (Feet): 100 Feet Assistive device: Rolling walker (2 wheeled);None Gait Pattern/deviations: Step-through pattern   Gait velocity interpretation: at or above normal speed for age/gender General Gait Details: With or without device, no evidence of fatigue.  Stairs            Wheelchair Mobility    Modified  Rankin (Stroke Patients Only) Modified Rankin (Stroke Patients Only) Pre-Morbid Rankin Score: No symptoms Modified Rankin: No symptoms     Balance Overall balance assessment: Independent                               Standardized Balance Assessment Standardized Balance Assessment : Berg Balance Test Berg Balance Test Sit to Stand: Able to stand without using hands and stabilize independently Standing Unsupported: Able to stand safely 2 minutes Sitting with Back Unsupported but Feet Supported on Floor or Stool: Able to sit safely and securely 2 minutes Stand to Sit: Sits safely with minimal use of hands Transfers: Able to transfer safely, minor use of hands Standing Unsupported with Eyes Closed: Able to stand 10 seconds safely Standing Ubsupported with Feet Together: Able to place feet together independently and stand 1 minute safely From Standing, Reach Forward with Outstretched Arm: Can reach confidently >25 cm (10") From Standing Position, Pick up Object from Floor: Able to pick up shoe safely and easily From Standing Position, Turn to Look Behind Over each Shoulder: Looks behind from both sides and weight shifts well Turn 360 Degrees: Able to turn 360 degrees safely one side only in 4 seconds or less Standing Unsupported, Alternately Place Feet on Step/Stool: Able to complete 4 steps without aid or supervision Standing Unsupported, One Foot in Front: Able to plae foot ahead of the other independently and hold 30 seconds Standing on One Leg: Tries to lift leg/unable to hold 3 seconds but remains standing independently Total Score:  49         Pertinent Vitals/Pain Pain Assessment: No/denies pain    Home Living Family/patient expects to be discharged to:: Assisted living ([facility])               Home Equipment: Kasandra Knudsen - single point Additional Comments: No falls in past year per patient.    Prior Function Level of Independence: Independent with assistive device(s)          Comments: Cane     Hand Dominance        Extremity/Trunk Assessment   Upper Extremity Assessment: Overall WFL for tasks assessed           Lower Extremity Assessment: Overall WFL for tasks assessed      Cervical / Trunk Assessment: Normal  Communication   Communication: No difficulties  Cognition Arousal/Alertness: Awake/alert Behavior During Therapy: WFL for tasks assessed/performed Overall Cognitive Status: Within Functional Limits for tasks assessed                      General Comments      Exercises        Assessment/Plan    PT Assessment All further PT needs can be met in the next venue of care (Patient receiving PT services at ILF.)  PT Diagnosis Abnormality of gait   PT Problem List Decreased balance  PT Treatment Interventions     PT Goals (Current goals can be found in the Care Plan section) Acute Rehab PT Goals Patient Stated Goal: Get back home    Frequency     Barriers to discharge        Co-evaluation               End of Session Equipment Utilized During Treatment: Gait belt Activity Tolerance: Patient tolerated treatment well Patient left: in bed (leaving for diagnostic test with staff.) Nurse Communication: Mobility status         Time: 1610-9604 PT Time Calculation (min) (ACUTE ONLY): 15 min   Charges:   PT Evaluation $Initial PT Evaluation Tier I: 1 Procedure     PT G CodesZenia Resides, Prosper Paff L 05/03/15, 10:19 AM

## 2015-04-20 DIAGNOSIS — E039 Hypothyroidism, unspecified: Secondary | ICD-10-CM

## 2015-04-20 DIAGNOSIS — F32A Depression, unspecified: Secondary | ICD-10-CM

## 2015-04-20 DIAGNOSIS — F329 Major depressive disorder, single episode, unspecified: Secondary | ICD-10-CM

## 2015-04-20 MED ORDER — ATORVASTATIN CALCIUM 40 MG PO TABS: 40.0000 mg | ORAL_TABLET | Freq: Every day | ORAL | Status: DC

## 2015-04-20 NOTE — Progress Notes (Signed)
Utilization Review Completed.Dowell, Deborah T11/11/2014  

## 2015-04-20 NOTE — Progress Notes (Signed)
Pt d/c to home by car with family. Assessment stable. All questions answered. 

## 2015-04-20 NOTE — Discharge Summary (Signed)
Name: Autumn Allen MRN: [id number] DOB: 05/24/1930 81 y.o. PCP: Geoffry Paradiseichard Aronson, MD  Date of Admission: 04/18/2015  1:10 PM Date of Discharge: 04/20/2015 Attending Physician: Earl LagosNischal Narendra, MD  Discharge Diagnosis: Principal Problem: TIA Active Problems: HTN Hyperlipidemia Hypothyroidism Depression   Discharge Medications:   Medication List    TAKE these medications        aspirin EC 81 MG tablet  Take 81 mg by mouth daily.     atorvastatin 40 MG tablet  Commonly known as:  LIPITOR  Take 1 tablet (40 mg total) by mouth daily at 6 PM.     BIOTIN PO  Take 1 capsule by mouth daily.     felodipine 5 MG 24 hr tablet  Commonly known as:  PLENDIL  Take 5 mg by mouth daily.     FLUZONE HIGH-DOSE 0.5 ML Susy  Generic drug:  Influenza Vac Split High-Dose     indapamide 2.5 MG tablet  Commonly known as:  LOZOL  Take 2.5 mg by mouth daily.     levothyroxine 50 MCG tablet  Commonly known as:  SYNTHROID, LEVOTHROID  Take 50 mcg by mouth daily.     UREX 1 G tablet  Generic drug:  methenamine  Take 1 g by mouth 2 (two) times daily with a meal.     VITAMIN D2 PO  Take 1 capsule by mouth daily.        Disposition and follow-up:   [facility]  At the hospital follow up visit please address:  1.  TIA: Risk factor modification. Check that patient is taking Atorvastatin 40 mg daily and ASA 81 mg daily. Ensure no new neuro symptoms.   2.  Labs / imaging needed at time of follow-up: None  3.  Pending labs/ test needing follow-up: HbA1c  Follow-up Appointments: Follow-up Information    Follow up with ARONSON,RICHARD A, MD.   Specialty:  Internal Medicine   Why:  Please make appointment in the next 1-2 weeks    Contact information:   [address] [place] [address] [phone]       Discharge Instructions: Discharge Instructions    Diet - low sodium heart healthy     Complete by:  As directed      Increase activity slowly    Complete by:  As directed            Procedures Performed:  [name] Contrast  04/18/2015  CLINICAL DATA:  81 year old female with dizziness and loss of focus for several days. Initial encounter. EXAM: MRI HEAD WITHOUT CONTRAST TECHNIQUE: Multiplanar, multiecho pulse sequences of the brain and surrounding structures were obtained without intravenous contrast. COMPARISON:  Brain MRI 05/11/2014. FINDINGS: Major intracranial vascular flow voids are stable. Cerebral volume is stable and within normal limits for age. No restricted diffusion to suggest acute infarction. No midline shift, mass effect, evidence of mass lesion, ventriculomegaly, extra-axial collection or acute intracranial hemorrhage. Cervicomedullary junction and pituitary are within normal limits. Negative visualized cervical spine. Stable gray and white matter signal throughout the brain. Mild for age nonspecific cerebral white matter signal changes. No cortical encephalomalacia or chronic cerebral blood products. Visible internal auditory structures appear normal. Trace right mastoid fluid is unchanged. Negative nasopharynx. Mild ethmoid and frontal sinus mucosal thickening also is stable. Other Visualized paranasal sinuses and mastoids are clear. Negative orbit and scalp soft tissues. IMPRESSION: No acute intracranial abnormality. Stable noncontrast MRI  appearance of the brain since 2015, with mild for age white matter signal changes. Electronically Signed   By: Odessa Fleming M.D.   On: 04/18/2015 17:33    2D Echo:  04/19/15 Study Conclusions - Left ventricle: The cavity size was normal. Wall thickness was increased in a pattern of mild LVH. Systolic function was normal. The estimated ejection fraction was in the range of 55% to 60%. Doppler parameters are consistent with abnormal left ventricular relaxation (grade 1 diastolic dysfunction). - Left atrium: The atrium was  mildly to moderately dilated. - Right atrium: The atrium was mildly dilated. - Pulmonary arteries: PA peak pressure: 37 mm Hg (S). - Impressions: Very limited echo due to poor sound wave transmission.  Impressions: - Very limited echo due to poor sound wave transmission.   Admission HPI: Autumn Allen is a 81yo woman with PMHx of HTN, hyperlipidemia, OSA, and hypothyroidism who presents to the ED for dizziness, dysarthria, and possibly left facial drooping first observed this morning and now improving after evaluation at ED. Her symptoms started with dizziness and a sense of "rocking like on a boat" yesterday. She was then undergoing PT for her chronic osteoarthritis of the knee and plantar fasciitis this morning when her therapist observed she was slurring speech and had a facial droop. She also reported that after this she had difficulty with some word finding or speaking including naming the nurse she was working with.  Since arrival at the ED a brain MRI was obtained showing no acute changes and mild for age white matter changes. She is no longer symptomatic of her symptoms and has a normal gait limited by walking with a cane for advanced left knee OA. Initial laboratory studies including Bmet, CBC with diff, UA, PT/INR were unremarkable.  She has previously been seen by neurology outpatient for gait instability and previous MRI obtained demonstrating possible white matter microvascular ischemic changes without gross atrophy, hydrocephalus, or focal infarcts. She has previously been evaluated by cardiology for heart palpitations in 2014 without significant findings.   Hospital Course by problem list:  TIA: She presented with symptoms of dizziness, dysarthria, and facial drooping that have all resolved by time of admission. MRI without acute changes. Her presentation and negative MRI were most consistent with a TIA. EEG obtained did not show any ongoing epileptiform activity or focal  findings. She underwent an echocardiogram which showed a normal EF 55-60% and grade 1 diastolic dysfunction. Carotid dopplers without significant stenosis. She was started on a high intensity statin (atorvastatin 40 mg daily) given her elevated LDL 133. She was recommended to continue taking aspirin 81 mg daily and her home BP medications. She will follow up with her PCP in the next week.    Discharge Vitals:   BP 133/62 mmHg  Pulse 52  Temp(Src) 98.6 F (37 C) (Oral)  Resp 18  Ht  (1.651 m)  Wt 85.231 kg (187 lb 14.4 oz)  BMI 31.27 kg/m2  SpO2 96% Physical Exam General: alert, sitting up, NAD HEENT: [place]/AT, EOMI, mucus membranes moist CV: RRR, no m/g/r Pulm: CTA bilaterally, breaths non-labored Abd: BS+, soft, non-tender Ext: warm, no peripheral edema Neuro: alert and oriented x 3. EOMI. Smile symmetric. Shoulder shrug intact. Tongue midline. Strength intact in upper and lower extremities bilaterally. Sensation intact.   Signed: Su Hoff, MD 04/20/2015, 9:54 AM    Services Ordered on Discharge: None  Equipment Ordered on Discharge: None

## 2015-04-20 NOTE — Progress Notes (Signed)
Subjective: No acute events overnight. She denies any changes in vision, change in speech, weakness, tingling, or numbness. She reports all her symptoms have resolved.   Objective: Vital signs in last 24 hours: Filed Vitals:   04/19/15 1745 04/19/15 2125 04/20/15 0107 04/20/15 0527  BP: 128/62 153/68 142/62 136/64  Pulse: 59 59 55 56  Temp: 98.1 F (36.7 C) 98.7 F (37.1 C) 98.1 F (36.7 C) 98.4 F (36.9 C)  TempSrc: Oral Oral Oral Oral  Resp: 18 18 18 18   Height:      Weight:      SpO2: 94% 94% 94% 94%   Weight change:  No intake or output data in the 24 hours ending 04/20/15 0654  Physical Exam General: alert, sitting up, NAD HEENT: [place]/AT, EOMI, mucus membranes moist CV: RRR, no m/g/r Pulm: CTA bilaterally, breaths non-labored Abd: BS+, soft, non-tender Ext: warm, no peripheral edema Neuro: alert and oriented x 3. EOMI. Smile symmetric. Shoulder shrug intact. Tongue midline. Strength intact in upper and lower extremities bilaterally. Sensation intact.   Lab Results: Basic Metabolic Panel:  Recent Labs Lab 04/18/15 1349 04/18/15 1400  NA 140 141  K 4.1 4.1  CL 103 101  CO2 29  --   GLUCOSE 94 90  BUN 15 18  CREATININE 0.82 0.70  CALCIUM 9.5  --    Liver Function Tests:  Recent Labs Lab 04/18/15 1349  AST 25  ALT 23  ALKPHOS 48  BILITOT 0.9  PROT 6.9  ALBUMIN 3.8   CBC:  Recent Labs Lab 04/18/15 1349 04/18/15 1400  WBC 6.7  --   NEUTROABS 3.6  --   HGB 15.9* 16.7*  HCT 46.9* 49.0*  MCV 98.7  --   PLT 225  --    Fasting Lipid Panel:  Recent Labs Lab 04/19/15 0615  CHOL 218*  HDL 40*  LDLCALC 133*  TRIG 224*  CHOLHDL 5.5   Coagulation:  Recent Labs Lab 04/18/15 1349  LABPROT 13.7  INR 1.03   Urinalysis:  Recent Labs Lab 04/18/15 1353  COLORURINE YELLOW  LABSPEC 1.011  PHURINE 7.5  GLUCOSEU NEGATIVE  HGBUR NEGATIVE  BILIRUBINUR NEGATIVE  KETONESUR NEGATIVE  PROTEINUR NEGATIVE  UROBILINOGEN 0.2  NITRITE NEGATIVE   LEUKOCYTESUR TRACE*   Studies/Results: [name] Contrast  04/18/2015  CLINICAL DATA:  81 year old female with dizziness and loss of focus for several days. Initial encounter. EXAM: MRI HEAD WITHOUT CONTRAST TECHNIQUE: Multiplanar, multiecho pulse sequences of the brain and surrounding structures were obtained without intravenous contrast. COMPARISON:  Brain MRI 05/11/2014. FINDINGS: Major intracranial vascular flow voids are stable. Cerebral volume is stable and within normal limits for age. No restricted diffusion to suggest acute infarction. No midline shift, mass effect, evidence of mass lesion, ventriculomegaly, extra-axial collection or acute intracranial hemorrhage. Cervicomedullary junction and pituitary are within normal limits. Negative visualized cervical spine. Stable gray and white matter signal throughout the brain. Mild for age nonspecific cerebral white matter signal changes. No cortical encephalomalacia or chronic cerebral blood products. Visible internal auditory structures appear normal. Trace right mastoid fluid is unchanged. Negative nasopharynx. Mild ethmoid and frontal sinus mucosal thickening also is stable. Other Visualized paranasal sinuses and mastoids are clear. Negative orbit and scalp soft tissues. IMPRESSION: No acute intracranial abnormality. Stable noncontrast MRI appearance of the brain since 2015, with mild for age white matter signal changes. Electronically Signed   By: Odessa FlemingH  Hall M.D.   On: 04/18/2015 17:33   Medications: I have reviewed the patient's  current medications. Scheduled Meds: .  stroke: mapping our early stages of recovery book   Does not apply Once  . aspirin EC  81 mg Oral Daily  . atorvastatin  40 mg Oral q1800  . enoxaparin (LOVENOX) injection  40 mg Subcutaneous Q24H  . escitalopram  10 mg Oral Daily  . felodipine  5 mg Oral Daily  . indapamide  2.5 mg Oral Daily  . levothyroxine  50 mcg Oral QAC breakfast  . pantoprazole  40 mg Oral Daily    Continuous Infusions:  PRN Meds:. Assessment/Plan:  TIA (Transient Ischemic Attack): She presented with symptoms of dizziness, dysarthria, and facial drooping that have all resolved. MRI without acute changes. Her presentation and negative MRI are most consistent with a TIA.  EEG obtained did not show any ongoing epileptiform activity or focal findings. She does have a history of heart palpitations previously worked in 2014 for this, with findings only for symptomatic PACs of no hemodynamic significance. Will discharge today with follow up with her PCP.  - Neurology following, appreciate recommendations  - Continue ASA 81 mg daily  - Obtain carotid doppler US>> no significant carotid artery stenosis. Vertebral arteries patient with antegrade flow.  - Obtain TTE>> EF 55-60%, grade 1 diastolic dysfunction - Hgb A1c pending  - Lipid panel>> elevated LDL 133 and HDL low-nml at 40 - SLP/PT/OT consulted, appreciate evaluations  Hypertension: BPs stable in 120s-160s systolic.  - Continue home Felodipine and Indapamide   Hyperlipidemia: Lipid panel shows Chol 218, Trigly 224, HDL 40, and LDL 133. - Continue Atorvastatin 40 mg daily   Hypothyroidism: - Continue Synthroid 50 mcg daily   Depression: Symptoms well controlled. - Continue Lexapro 10 mg daily   Diet: Heart healthy VTE PPx: Lovenox SQ Dispo: Discharge today  The patient does have a current PCP Geoffry Paradise, MD) and does need an [facility] hospital follow-up appointment after discharge.  The patient does not have transportation limitations that hinder transportation to clinic appointments.  .Services Needed at time of discharge: Y = Yes, Blank = No PT:   OT:   RN:   Equipment:   Other:     LOS: 2 days   Rich Number, MD, MPH Internal Medicine Resident, PGY-II Pager: [phone]

## 2015-04-20 NOTE — Discharge Instructions (Signed)
It was a pleasure taking care of you, Autumn Allen. You were admitted to the hospital for a transient ischemic attack (TIA). I have provided some information about this condition below. It will be important for you to follow up with your primary care doctor in the next 1-2 weeks so we can modify your risk factors (high blood pressure, high cholesterol) to prevent future TIAs or strokes. Please take an aspirin daily and Atorvastatin 40 mg daily. I sent a prescription for Atorvastatin to your pharmacy.   Take care, Dr. Beckie Salts  Transient Ischemic Attack A transient ischemic attack (TIA) is a "warning stroke" that causes stroke-like symptoms. Unlike a stroke, a TIA does not cause permanent damage to the brain. The symptoms of a TIA can happen very fast and do not last long. It is important to know the symptoms of a TIA and what to do. This can help prevent a major stroke or death. CAUSES  A TIA is caused by a temporary blockage in an artery in the brain or neck (carotid artery). The blockage does not allow the brain to get the blood supply it needs and can cause different symptoms. The blockage can be caused by either:  A blood clot.  Fatty buildup (plaque) in a neck or brain artery. RISK FACTORS  High blood pressure (hypertension).  High cholesterol.  Diabetes mellitus.  Heart disease.  The buildup of plaque in the blood vessels (peripheral artery disease or atherosclerosis).  The buildup of plaque in the blood vessels that provide blood and oxygen to the brain (carotid artery stenosis).  An abnormal heart rhythm (atrial fibrillation).  Obesity.  Using any tobacco products, including cigarettes, chewing tobacco, or electronic cigarettes.  Taking oral contraceptives, especially in combination with using tobacco.  Physical inactivity.  A diet high in fats, salt (sodium), and calories.  Excessive alcohol use.  Use of illegal drugs (especially cocaine and methamphetamine).  Being  female.  Being African American.  Being over the age of 35 years.  Family history of stroke.  Previous history of blood clots, stroke, TIA, or heart attack.  Sickle cell disease. SIGNS AND SYMPTOMS  TIA symptoms are the same as a stroke but are temporary. These symptoms usually develop suddenly, or may be newly present upon waking from sleep:  Sudden weakness or numbness of the face, arm, or leg, especially on one side of the body.  Sudden trouble walking or difficulty moving arms or legs.  Sudden confusion.  Sudden personality changes.  Trouble speaking (aphasia) or understanding.  Difficulty swallowing.  Sudden trouble seeing in one or both eyes.  Double vision.  Dizziness.  Loss of balance or coordination.  Sudden severe headache with no known cause.  Trouble reading or writing.  Loss of bowel or bladder control.  Loss of consciousness. DIAGNOSIS  Your health care provider may be able to determine the presence or absence of a TIA based on your symptoms, history, and physical exam. CT scan of the brain is usually performed to help identify a TIA. Other tests may include:  Electrocardiography (ECG).  Continuous heart monitoring.  Echocardiography.  Carotid ultrasonography.  MRI.  A scan of the brain circulation.  Blood tests. TREATMENT  Since the symptoms of TIA are the same as a stroke, it is important to seek treatment as soon as possible. You may need a medicine to dissolve a blood clot (thrombolytic) if that is the cause of the TIA. This medicine cannot be given if too much time has passed.  Treatment may also include:   Rest, oxygen, fluids through an IV tube, and medicines to thin the blood (anticoagulants).  Measures will be taken to prevent short-term and long-term complications, including infection from breathing foreign material into the lungs (aspiration pneumonia), blood clots in the legs, and falls.  Procedures to either remove plaque in the  carotid arteries or dilate carotid arteries that have narrowed due to plaque. Those procedures are:  Carotid endarterectomy.  Carotid angioplasty and stenting.  Medicines and diet may be used to address diabetes, high blood pressure, and other underlying risk factors. HOME CARE INSTRUCTIONS   Take medicines only as directed by your health care provider. Follow the directions carefully. Medicines may be used to control risk factors for a stroke. Be sure you understand all your medicine instructions.  You may be told to take aspirin or the anticoagulant warfarin. Warfarin needs to be taken exactly as instructed.  Taking too much or too little warfarin is dangerous. Too much warfarin increases the risk of bleeding. Too little warfarin continues to allow the risk for blood clots. While taking warfarin, you will need to have regular blood tests to measure your blood clotting time. A PT blood test measures how long it takes for blood to clot. Your PT is used to calculate another value called an INR. Your PT and INR help your health care provider to adjust your dose of warfarin. The dose can change for many reasons. It is critically important that you take warfarin exactly as prescribed.  Many foods, especially foods high in vitamin K can interfere with warfarin and affect the PT and INR. Foods high in vitamin K include spinach, kale, broccoli, cabbage, collard and turnip greens, Brussels sprouts, peas, cauliflower, seaweed, and parsley, as well as beef and pork liver, green tea, and soybean oil. You should eat a consistent amount of foods high in vitamin K. Avoid major changes in your diet, or notify your health care provider before changing your diet. Arrange a visit with a dietitian to answer your questions.  Many medicines can interfere with warfarin and affect the PT and INR. You must tell your health care provider about any and all medicines you take; this includes all vitamins and supplements. Be  especially cautious with aspirin and anti-inflammatory medicines. Do not take or discontinue any prescribed or over-the-counter medicine except on the advice of your health care provider or pharmacist.  Warfarin can have side effects, such as excessive bruising or bleeding. You will need to hold pressure over cuts for longer than usual. Your health care provider or pharmacist will discuss other potential side effects.  Avoid sports or activities that may cause injury or bleeding.  Be careful when shaving, flossing your teeth, or handling sharp objects.  Alcohol can change the body's ability to handle warfarin. It is best to avoid alcoholic drinks or consume only very small amounts while taking warfarin. Notify your health care provider if you change your alcohol intake.  Notify your dentist or other health care providers before procedures.  Eat a diet that includes 5 or more servings of fruits and vegetables each day. This may reduce the risk of stroke. Certain diets may be prescribed to address high blood pressure, high cholesterol, diabetes, or obesity.  A diet low in sodium, saturated fat, trans fat, and cholesterol is recommended to manage high blood pressure.  A diet low in saturated fat, trans fat, and cholesterol, and high in fiber may control cholesterol levels.  A controlled-carbohydrate, controlled-sugar  diet is recommended to manage diabetes.  A reduced-calorie diet that is low in sodium, saturated fat, trans fat, and cholesterol is recommended to manage obesity.  Maintain a healthy weight.  Stay physically active. It is recommended that you get at least 30 minutes of activity on most or all days.  Do not use any tobacco products, including cigarettes, chewing tobacco, or electronic cigarettes. If you need help quitting, ask your health care provider.  Limit alcohol intake to no more than 1 drink per day for nonpregnant women and 2 drinks per day for men. One drink equals 12  ounces of beer, 5 ounces of wine, or 1 ounces of hard liquor.  Do not abuse drugs.  A safe home environment is important to reduce the risk of falls. Your health care provider may arrange for specialists to evaluate your home. Having grab bars in the bedroom and bathroom is often important. Your health care provider may arrange for equipment to be used at home, such as raised toilets and a seat for the shower.  Follow all instructions for follow-up with your health care provider. This is very important. This includes any referrals and lab tests. Proper follow-up can prevent a stroke or another TIA from occurring. PREVENTION  The risk of a TIA can be decreased by appropriately treating high blood pressure, high cholesterol, diabetes, heart disease, and obesity, and by quitting smoking, limiting alcohol, and staying physically active. SEEK MEDICAL CARE IF:  You have personality changes.  You have difficulty swallowing.  You are seeing double.  You have dizziness.  You have a fever. SEEK IMMEDIATE MEDICAL CARE IF:  Any of the following symptoms may represent a serious problem that is an emergency. Do not wait to see if the symptoms will go away. Get medical help right away. Call your local emergency services (911 in [place] Do not drive yourself to the hospital.  You have sudden weakness or numbness of the face, arm, or leg, especially on one side of the body.  You have sudden trouble walking or difficulty moving arms or legs.  You have sudden confusion.  You have trouble speaking (aphasia) or understanding.  You have sudden trouble seeing in one or both eyes.  You have a loss of balance or coordination.  You have a sudden, severe headache with no known cause.  You have new chest pain or an irregular heartbeat.  You have a partial or total loss of consciousness. MAKE SURE YOU:   Understand these instructions.  Will watch your condition.  Will get help right away if you are  not doing well or get worse.   This information is not intended to replace advice given to you by your health care provider. Make sure you discuss any questions you have with your health care provider.   Document Released: 03/10/2005 Document Revised: 06/21/2014 Document Reviewed: 09/05/2013 Elsevier Interactive Patient Education [employer]

## 2015-04-21 LAB — HEMOGLOBIN A1C
HEMOGLOBIN A1C: 6.4 % — AB (ref 4.8–5.6)
MEAN PLASMA GLUCOSE: 137 mg/dL

## 2015-04-30 ENCOUNTER — Emergency Department (HOSPITAL_COMMUNITY)
Admission: EM | Admit: 2015-04-30 | Discharge: 2015-04-30 | Disposition: A | Discharge status: Home / Self Care | Payer: Medicare Other | Attending: Emergency Medicine | Admitting: Emergency Medicine

## 2015-04-30 ENCOUNTER — Emergency Department (HOSPITAL_COMMUNITY): Payer: Medicare Other

## 2015-04-30 ENCOUNTER — Encounter (HOSPITAL_COMMUNITY): Payer: Self-pay | Admitting: Emergency Medicine

## 2015-04-30 DIAGNOSIS — I10 Essential (primary) hypertension: Secondary | ICD-10-CM | POA: Insufficient documentation

## 2015-04-30 DIAGNOSIS — Z8742 Personal history of other diseases of the female genital tract: Secondary | ICD-10-CM | POA: Insufficient documentation

## 2015-04-30 DIAGNOSIS — Z8659 Personal history of other mental and behavioral disorders: Secondary | ICD-10-CM | POA: Diagnosis not present

## 2015-04-30 DIAGNOSIS — Z9981 Dependence on supplemental oxygen: Secondary | ICD-10-CM | POA: Insufficient documentation

## 2015-04-30 DIAGNOSIS — Z87891 Personal history of nicotine dependence: Secondary | ICD-10-CM | POA: Insufficient documentation

## 2015-04-30 DIAGNOSIS — E785 Hyperlipidemia, unspecified: Secondary | ICD-10-CM | POA: Diagnosis not present

## 2015-04-30 DIAGNOSIS — Z79899 Other long term (current) drug therapy: Secondary | ICD-10-CM | POA: Diagnosis not present

## 2015-04-30 DIAGNOSIS — G459 Transient cerebral ischemic attack, unspecified: Secondary | ICD-10-CM | POA: Diagnosis not present

## 2015-04-30 DIAGNOSIS — Z8739 Personal history of other diseases of the musculoskeletal system and connective tissue: Secondary | ICD-10-CM | POA: Insufficient documentation

## 2015-04-30 DIAGNOSIS — Z792 Long term (current) use of antibiotics: Secondary | ICD-10-CM | POA: Diagnosis not present

## 2015-04-30 DIAGNOSIS — R42 Dizziness and giddiness: Secondary | ICD-10-CM | POA: Insufficient documentation

## 2015-04-30 DIAGNOSIS — M1990 Unspecified osteoarthritis, unspecified site: Secondary | ICD-10-CM | POA: Diagnosis not present

## 2015-04-30 DIAGNOSIS — Z7982 Long term (current) use of aspirin: Secondary | ICD-10-CM | POA: Diagnosis not present

## 2015-04-30 DIAGNOSIS — R4781 Slurred speech: Secondary | ICD-10-CM | POA: Diagnosis present

## 2015-04-30 DIAGNOSIS — G4733 Obstructive sleep apnea (adult) (pediatric): Secondary | ICD-10-CM | POA: Diagnosis not present

## 2015-04-30 DIAGNOSIS — E039 Hypothyroidism, unspecified: Secondary | ICD-10-CM | POA: Insufficient documentation

## 2015-04-30 HISTORY — DX: Transient cerebral ischemic attack, unspecified: G45.9

## 2015-04-30 LAB — I-STAT CHEM 8, ED
BUN: 22 mg/dL — AB (ref 6–20)
CALCIUM ION: 1.19 mmol/L (ref 1.13–1.30)
Chloride: 100 mmol/L — ABNORMAL LOW (ref 101–111)
Creatinine, Ser: 0.9 mg/dL (ref 0.44–1.00)
Glucose, Bld: 78 mg/dL (ref 65–99)
HCT: 49 % — ABNORMAL HIGH (ref 36.0–46.0)
Hemoglobin: 16.7 g/dL — ABNORMAL HIGH (ref 12.0–15.0)
Potassium: 3.9 mmol/L (ref 3.5–5.1)
SODIUM: 141 mmol/L (ref 135–145)
TCO2: 28 mmol/L (ref 0–100)

## 2015-04-30 LAB — COMPREHENSIVE METABOLIC PANEL
ALK PHOS: 58 U/L (ref 38–126)
ALT: 23 U/L (ref 14–54)
ANION GAP: 9 (ref 5–15)
AST: 23 U/L (ref 15–41)
Albumin: 3.9 g/dL (ref 3.5–5.0)
BILIRUBIN TOTAL: 1 mg/dL (ref 0.3–1.2)
BUN: 17 mg/dL (ref 6–20)
CALCIUM: 9.5 mg/dL (ref 8.9–10.3)
CO2: 28 mmol/L (ref 22–32)
Chloride: 104 mmol/L (ref 101–111)
Creatinine, Ser: 0.85 mg/dL (ref 0.44–1.00)
Glucose, Bld: 81 mg/dL (ref 65–99)
Potassium: 4 mmol/L (ref 3.5–5.1)
Sodium: 141 mmol/L (ref 135–145)
TOTAL PROTEIN: 7.3 g/dL (ref 6.5–8.1)

## 2015-04-30 LAB — I-STAT TROPONIN, ED: Troponin i, poc: 0.01 ng/mL (ref 0.00–0.08)

## 2015-04-30 LAB — DIFFERENTIAL
Basophils Absolute: 0.1 10*3/uL (ref 0.0–0.1)
Basophils Relative: 1 %
EOS PCT: 3 %
Eosinophils Absolute: 0.3 10*3/uL (ref 0.0–0.7)
LYMPHS ABS: 2.4 10*3/uL (ref 0.7–4.0)
LYMPHS PCT: 28 %
MONO ABS: 1 10*3/uL (ref 0.1–1.0)
MONOS PCT: 12 %
Neutro Abs: 4.9 10*3/uL (ref 1.7–7.7)
Neutrophils Relative %: 56 %

## 2015-04-30 LAB — PROTIME-INR
INR: 1.02 (ref 0.00–1.49)
PROTHROMBIN TIME: 13.6 s (ref 11.6–15.2)

## 2015-04-30 LAB — CBC
HCT: 45 % (ref 36.0–46.0)
Hemoglobin: 14.9 g/dL (ref 12.0–15.0)
MCH: 32.7 pg (ref 26.0–34.0)
MCHC: 33.1 g/dL (ref 30.0–36.0)
MCV: 98.7 fL (ref 78.0–100.0)
PLATELETS: 239 10*3/uL (ref 150–400)
RBC: 4.56 MIL/uL (ref 3.87–5.11)
RDW: 12.3 % (ref 11.5–15.5)
WBC: 8.6 10*3/uL (ref 4.0–10.5)

## 2015-04-30 LAB — ETHANOL: Alcohol, Ethyl (B): <5 mg/dL (ref <5)

## 2015-04-30 LAB — APTT: aPTT: 27 seconds (ref 24–37)

## 2015-04-30 LAB — CBG MONITORING, ED: Glucose-Capillary: 75 mg/dL (ref 65–99)

## 2015-04-30 MED ORDER — CLOPIDOGREL BISULFATE 75 MG PO TABS: 75.0000 mg | ORAL_TABLET | Freq: Every day | ORAL | Status: DC

## 2015-04-30 MED ORDER — IOHEXOL 350 MG/ML SOLN
50.0000 mL | Freq: Once | INTRAVENOUS | Status: AC | PRN
  Administered 2015-04-30: 50 mL via INTRAVENOUS

## 2015-04-30 NOTE — ED Provider Notes (Signed)
Patient signed out to me by Dr. Gwenlyn FudgeGoldstein and CT results reviewed and without acute process. Patient will be discharged  Lorre NickAnthony Deward Sebek, MD 04/30/15 [id number]

## 2015-04-30 NOTE — ED Notes (Signed)
Patient is alert and orientedx4.  Patient was explained discharge instructions and they understood them with no questions.  The patient's brother is taking her home.

## 2015-04-30 NOTE — Discharge Instructions (Signed)
It is very important to see a cardiologist as soon as possible to get a 30 day Holter monitor. If you can't get into the cardiologist call your Primary Doctor. If you develop recurrent symptoms call 911 or return to the ER immediately. You need to stop Aspirin and start on Plavix. This is a stronger blood thinner than aspirin, and could lead to increased bleeding from cuts, in your GI tract or in your brain. If you develop any signs of bleeding see your doctor or come back to the ER

## 2015-04-30 NOTE — ED Notes (Signed)
Per EMS [facility]. After physical therapy witnessed slurred speech "thick tongue", left arm weakness, left side facial droop, and blurred vision. Last seen normal 1215 when EMS arrived 1227 symptoms were resolved. Alert answering and following commands appropriate.

## 2015-04-30 NOTE — ED Notes (Signed)
Spoke with CT who stated patient is next. 

## 2015-04-30 NOTE — ED Notes (Signed)
Patient transported to CT 

## 2015-04-30 NOTE — ED Provider Notes (Addendum)
CSN: [id number]     Arrival date & time 04/30/15  1316 History   First MD Initiated Contact with Patient 04/30/15 1318     Chief Complaint  Patient presents with  . Transient Ischemic Attack     (Consider location/radiation/quality/duration/timing/severity/associated sxs/prior Treatment) HPI  81 year old female resents with acute slurred speech and facial droop that resolved. Started around 12 PM and lasted up to an hour, patient is not percent sure of the time. Patient was at physical therapy and they noticed she was slurring her words and patient felt like her tongue was not moving correctly. Had some facial droop. EMS reports left arm weakness, patient denies. On exam there is slight weakness of her left upper cavity but patient states she has not noticed this and thus does not know her last known normal for this. His speech is completely cleared. She also complains of lightheadedness that she describes as mild and has been constant since last admission over a week ago. Denies headaches or blurry vision. No leg symptoms.  Past Medical History  Diagnosis Date  . Hypertension   . Hypothyroid   . Hyperlipemia   . Sleep apnea   . Depression   . Chronic cystitis   . Plantar fasciitis   . S/P TAH-BSO   . S/P cholecystectomy   . Hx of cardiovascular stress test     a. ETT-Echo 3/12:  EF 60%, normal study  . OSA on CPAP   . Hx of echocardiogram     a. Echo 2/14:  Mild LVH, EF 60-65%, Gr 1 diast dysfn, mild to mod AS, mean 17 mmHg, AVA 1.3 (VTI), trivial MR, mild LAE, PASP 31   . Aortic stenosis     a. mild to mod by Echo 07/2012  . Palpitations     a. event monitor 3/14:  NSR, sinus brady  . Allergy   . Arthritis   . Gait disturbance 06/10/2014  . TIA (transient ischemic attack)    Past Surgical History  Procedure Laterality Date  . Cholecystectomy    . Abdominal hysterectomy    . Incontinence surgery    . Tonsillectomy     Family History  Problem Relation Age of Onset  .  Heart attack Mother 59  . Heart failure Mother   . Lung disease Father   . Cancer - Prostate Father   . Healthy Brother   . Healthy Brother    Social History  Substance Use Topics  . Smoking status: Former Games developer  . Smokeless tobacco: Never Used  . Alcohol Use: No   OB History    No data available     Review of Systems  Eyes: Negative for visual disturbance.  Neurological: Positive for speech difficulty and light-headedness. Negative for weakness, numbness and headaches.  All other systems reviewed and are negative.     Allergies  Metronidazole and related; Ciprofloxacin; Etodolac; Naproxen; Nitrofurantoin; Sulfonamide derivatives; Alprazolam; Epinephrine; and Macrobid  Home Medications   Prior to Admission medications   Medication Sig Start Date End Date Taking? Authorizing Provider  aspirin EC 81 MG tablet Take 81 mg by mouth daily.    Historical Provider, MD  atorvastatin (LIPITOR) 40 MG tablet Take 1 tablet (40 mg total) by mouth daily at 6 PM. 04/20/15   Carly J Rivet, MD  BIOTIN PO Take 1 capsule by mouth daily.    Historical Provider, MD  Ergocalciferol (VITAMIN D2 PO) Take 1 capsule by mouth daily.    Historical Provider, MD  felodipine (PLENDIL) 5 MG 24 hr tablet Take 5 mg by mouth daily.    Historical Provider, MD  FLUZONE HIGH-DOSE 0.5 ML SUSY  03/24/14   Historical Provider, MD  indapamide (LOZOL) 2.5 MG tablet Take 2.5 mg by mouth daily.    Historical Provider, MD  levothyroxine (SYNTHROID, LEVOTHROID) 50 MCG tablet Take 50 mcg by mouth daily.    Historical Provider, MD  methenamine (UREX) 1 G tablet Take 1 g by mouth 2 (two) times daily with a meal.    Historical Provider, MD   BP 130/57 mmHg  Temp(Src) 97.7 F (36.5 C) (Oral)  Resp 18  Ht  (1.651 m)  Wt 180 lb (81.647 kg)  BMI 29.95 kg/m2  SpO2 94% Physical Exam  Constitutional: She is oriented to person, place, and time. She appears well-developed and well-nourished.  HENT:  Head: Normocephalic  and atraumatic.  Right Ear: External ear normal.  Left Ear: External ear normal.  Nose: Nose normal.  Eyes: EOM are normal. Pupils are equal, round, and reactive to light. Right eye exhibits no discharge. Left eye exhibits no discharge.  Cardiovascular: Normal rate, regular rhythm and normal heart sounds.   Pulmonary/Chest: Effort normal and breath sounds normal.  Abdominal: Soft. She exhibits no distension. There is no tenderness.  Neurological: She is alert and oriented to person, place, and time.  Normal fluent speech. CN 2-12 grossly intact. 5/5 strength in RUE, RLE, LLE. 5/5 strength in LUE but slightly weaker compared to right  Skin: Skin is warm and dry.  Nursing note and vitals reviewed.   ED Course  Procedures (including critical care time) Labs Review Labs Reviewed  I-STAT CHEM 8, ED - Abnormal; Notable for the following:    Chloride 100 (*)    BUN 22 (*)    Hemoglobin 16.7 (*)    HCT 49.0 (*)    All other components within normal limits  PROTIME-INR  APTT  CBC  DIFFERENTIAL  COMPREHENSIVE METABOLIC PANEL  ETHANOL  URINE RAPID DRUG SCREEN, HOSP PERFORMED  URINALYSIS, ROUTINE W REFLEX MICROSCOPIC (NOT AT [facility])  CBG MONITORING, ED  I-STAT TROPOININ, ED    Imaging Review Ct Head Wo Contrast  04/30/2015  CLINICAL DATA:  Transient slurred speech with left arm weakness and facial droop. Possible TIA. Initial encounter. EXAM: CT HEAD WITHOUT CONTRAST TECHNIQUE: Contiguous axial images were obtained from the base of the skull through the vertex without intravenous contrast. COMPARISON:  MRI 04/18/2015. FINDINGS: There is no evidence of acute intracranial hemorrhage, mass lesion, brain edema or extra-axial fluid collection. The ventricles and subarachnoid spaces are appropriately sized for age. There is no CT evidence of acute cortical infarction. There is stable mild periventricular low-density consistent with chronic small vessel ischemic change. The visualized paranasal  sinuses, mastoid air cells and middle ears are clear. The calvarium is intact. IMPRESSION: No acute intracranial findings. Stable mild chronic periventricular white matter disease. Electronically Signed   By: Carey Bullocks M.D.   On: 04/30/2015 15:03   I have personally reviewed and evaluated these images and lab results as part of my medical decision-making.   EKG Interpretation   Date/Time:  Wednesday April 30 2015 13:24:11 EST Ventricular Rate:  48 PR Interval:  156 QRS Duration: 93 QT Interval:  453 QTC Calculation: 405 R Axis:   52 Text Interpretation:  Sinus bradycardia Atrial premature complex Baseline  wander in lead(s) V1 No significant change since last tracing Confirmed by  Sharisa Toves  MD, Kellsey Sansone ([id number]) on 04/30/2015  2:12:56 PM      MDM   Final diagnoses:  Transient cerebral ischemia, unspecified transient cerebral ischemia type    Patient symptoms are consistent with a recurrent TIA. Symptoms currently resolved. There is very mild weakness although it is unclear if this is new left side. Dr. Lavon PaganiniNandigam evaluated the patient and feels this is a recurrent TIA and no further workup including an MRI her admission would be needed and she just had a TIA workup 1 week ago. He recommend she stop her Plavix and start on aspirin. She will need cardiology follow-up as an outpatient for 30 day Holter monitor as well as outpatient neuro follow-up. Discussed strict return precautions with patient.    Pricilla LovelessScott Beckam Abdulaziz, MD 04/30/15 1640  Prior to patient being discharged, Dr. Lavon PaganiniNandigam called back and asks for CTA head/neck to be performed to make her TIA workup complete. Will do this, if no significant abnormalities will d/c home. Care to Dr. Freida BusmanAllen to review CT prior to patient discharge.  Pricilla LovelessScott Murle Hellstrom, MD 04/30/15 [phone]

## 2015-04-30 NOTE — Consult Note (Signed)
Reason for Consult: evaluate for TIA Requesting Physician: Dr. Regenia Skeeter  HPI:  Autumn Allen is an 81 y.o. Female pt who had a transient 40 - 60 min of slurred speech and rt facial droop this afternoon, resolved by the time she was brought into ER. No one in ER has seen the facial droop or slurred speech. No other focal neuro  Sx per history.  She just finished swimming and came out of pool, when she noticed these symptoms. She was about to start physical therapy. Her physical therapist as she reported to her that she had right facial droop and sent her to the ER for further evaluation, per history from patient. Denies any limb weakness or any vision symptoms, negative ligamentous at this time.  On number fourth 2016, she had similar symptoms and at that time she just finished assuming similar to today. She was admitted for TIA evaluation and had an MRI of the brain done which did not show any evidence of acute stroke. Echocardiogram is also grossly unremarkable. ultrasound of the carotids did not show any significant carotid stenosis at that time. She was started on aspirin and is discharged She has been taking baby aspirin daily. She reported having some mild bruising without any significant bleeding problems   Past Medical History  Diagnosis Date  . Hypertension   . Hypothyroid   . Hyperlipemia   . Sleep apnea   . Depression   . Chronic cystitis   . Plantar fasciitis   . S/P TAH-BSO   . S/P cholecystectomy   . Hx of cardiovascular stress test     a. ETT-Echo 3/12:  EF 60%, normal study  . OSA on CPAP   . Hx of echocardiogram     a. Echo 2/14:  Mild LVH, EF 60-65%, Gr 1 diast dysfn, mild to mod AS, mean 17 mmHg, AVA 1.3 (VTI), trivial MR, mild LAE, PASP 31   . Aortic stenosis     a. mild to mod by Echo 07/2012  . Palpitations     a. event monitor 3/14:  NSR, sinus brady  . Allergy   . Arthritis   . Gait disturbance 06/10/2014  . TIA (transient ischemic attack)     Past  Surgical History  Procedure Laterality Date  . Cholecystectomy    . Abdominal hysterectomy    . Incontinence surgery    . Tonsillectomy      Family History  Problem Relation Age of Onset  . Heart attack Mother 64  . Heart failure Mother   . Lung disease Father   . Cancer - Prostate Father   . Healthy Brother   . Healthy Brother     Social History:  reports that she has quit smoking. She has never used smokeless tobacco. She reports that she does not drink alcohol or use illicit drugs.  Allergies:  Allergies  Allergen Reactions  . Metronidazole And Related     unknown  . Ciprofloxacin Other (See Comments)    Reaction unknown  . Etodolac Other (See Comments)    Reaction unknown  . Naproxen Other (See Comments)    Reaction unknown  . Nitrofurantoin Other (See Comments)    Reaction unknown  . Sulfonamide Derivatives Other (See Comments)    Reaction unknown  . Alprazolam Rash  . Epinephrine Palpitations  . Macrobid [Nitrofurantoin Macrocrystal] Rash    Medications: I have reviewed the patient's current medications. No current facility-administered medications for this encounter.   Current Outpatient Prescriptions  Medication Sig Dispense Refill  . atorvastatin (LIPITOR) 40 MG tablet Take 1 tablet (40 mg total) by mouth daily at 6 PM. 30 tablet 1  . BIOTIN PO Take 1 capsule by mouth daily.    . Ergocalciferol (VITAMIN D2 PO) Take 1 capsule by mouth daily.    . felodipine (PLENDIL) 5 MG 24 hr tablet Take 5 mg by mouth daily.    . indapamide (LOZOL) 2.5 MG tablet Take 2.5 mg by mouth daily.    Marland Kitchen levothyroxine (SYNTHROID, LEVOTHROID) 50 MCG tablet Take 50 mcg by mouth every evening.     . methenamine (UREX) 1 G tablet Take 1 g by mouth 2 (two) times daily with a meal.    . rosuvastatin (CRESTOR) 20 MG tablet Take 20 mg by mouth daily.    . clopidogrel (PLAVIX) 75 MG tablet Take 1 tablet (75 mg total) by mouth daily. 30 tablet 0  . FLUZONE HIGH-DOSE 0.5 ML SUSY         Results for orders placed or performed during the hospital encounter of 04/30/15 (from the past 48 hour(s))  CBG monitoring, ED     Status: None   Collection Time: 04/30/15  1:29 PM  Result Value Ref Range   Glucose-Capillary 75 65 - 99 mg/dL  Protime-INR     Status: None   Collection Time: 04/30/15  1:50 PM  Result Value Ref Range   Prothrombin Time 13.6 11.6 - 15.2 seconds   INR 1.02 0.00 - 1.49  APTT     Status: None   Collection Time: 04/30/15  1:50 PM  Result Value Ref Range   aPTT 27 24 - 37 seconds  CBC     Status: None   Collection Time: 04/30/15  1:50 PM  Result Value Ref Range   WBC 8.6 4.0 - 10.5 K/uL   RBC 4.56 3.87 - 5.11 MIL/uL   Hemoglobin 14.9 12.0 - 15.0 g/dL   HCT 45.0 36.0 - 46.0 %   MCV 98.7 78.0 - 100.0 fL   MCH 32.7 26.0 - 34.0 pg   MCHC 33.1 30.0 - 36.0 g/dL   RDW 12.3 11.5 - 15.5 %   Platelets 239 150 - 400 K/uL  Differential     Status: None   Collection Time: 04/30/15  1:50 PM  Result Value Ref Range   Neutrophils Relative % 56 %   Neutro Abs 4.9 1.7 - 7.7 K/uL   Lymphocytes Relative 28 %   Lymphs Abs 2.4 0.7 - 4.0 K/uL   Monocytes Relative 12 %   Monocytes Absolute 1.0 0.1 - 1.0 K/uL   Eosinophils Relative 3 %   Eosinophils Absolute 0.3 0.0 - 0.7 K/uL   Basophils Relative 1 %   Basophils Absolute 0.1 0.0 - 0.1 K/uL  Comprehensive metabolic panel     Status: None   Collection Time: 04/30/15  1:50 PM  Result Value Ref Range   Sodium 141 135 - 145 mmol/L   Potassium 4.0 3.5 - 5.1 mmol/L   Chloride 104 101 - 111 mmol/L   CO2 28 22 - 32 mmol/L   Glucose, Bld 81 65 - 99 mg/dL   BUN 17 6 - 20 mg/dL   Creatinine, Ser 0.85 0.44 - 1.00 mg/dL   Calcium 9.5 8.9 - 10.3 mg/dL   Total Protein 7.3 6.5 - 8.1 g/dL   Albumin 3.9 3.5 - 5.0 g/dL   AST 23 15 - 41 U/L   ALT 23 14 - 54 U/L  Alkaline Phosphatase 58 38 - 126 U/L   Total Bilirubin 1.0 0.3 - 1.2 mg/dL   GFR calc non Af Amer >60 >60 mL/min   GFR calc Af Amer >60 >60 mL/min    Comment:  (NOTE) The eGFR has been calculated using the CKD EPI equation. This calculation has not been validated in all clinical situations. eGFR's persistently <60 mL/min signify possible Chronic Kidney Disease.    Anion gap 9 5 - 15  I-stat troponin, ED (not at [facility], [facility])     Status: None   Collection Time: 04/30/15  2:03 PM  Result Value Ref Range   Troponin i, poc 0.01 0.00 - 0.08 ng/mL   Comment 3            Comment: Due to the release kinetics of cTnI, a negative result within the first hours of the onset of symptoms does not rule out myocardial infarction with certainty. If myocardial infarction is still suspected, repeat the test at appropriate intervals.   I-Stat Chem 8, ED  (not at [facility], [facility])     Status: Abnormal   Collection Time: 04/30/15  2:05 PM  Result Value Ref Range   Sodium 141 135 - 145 mmol/L   Potassium 3.9 3.5 - 5.1 mmol/L   Chloride 100 (L) 101 - 111 mmol/L   BUN 22 (H) 6 - 20 mg/dL   Creatinine, Ser 0.90 0.44 - 1.00 mg/dL   Glucose, Bld 78 65 - 99 mg/dL   Calcium, Ion 1.19 1.13 - 1.30 mmol/L   TCO2 28 0 - 100 mmol/L   Hemoglobin 16.7 (H) 12.0 - 15.0 g/dL   HCT 49.0 (H) 36.0 - 46.0 %    Ct Head Wo Contrast  04/30/2015  CLINICAL DATA:  Transient slurred speech with left arm weakness and facial droop. Possible TIA. Initial encounter. EXAM: CT HEAD WITHOUT CONTRAST TECHNIQUE: Contiguous axial images were obtained from the base of the skull through the vertex without intravenous contrast. COMPARISON:  MRI 04/18/2015. FINDINGS: There is no evidence of acute intracranial hemorrhage, mass lesion, brain edema or extra-axial fluid collection. The ventricles and subarachnoid spaces are appropriately sized for age. There is no CT evidence of acute cortical infarction. There is stable mild periventricular low-density consistent with chronic small vessel ischemic change. The visualized paranasal sinuses, mastoid air cells and middle ears are clear. The calvarium is intact.  IMPRESSION: No acute intracranial findings. Stable mild chronic periventricular white matter disease. Electronically Signed   By: Richardean Sale M.D.   On: 04/30/2015 15:03    Review of Systems  Constitutional: Negative.   HENT: Negative.   Eyes: Negative.   Respiratory: Negative.   Cardiovascular: Negative.   Gastrointestinal: Negative.   Genitourinary: Negative.   Skin: Negative.   Neurological: Positive for speech change and focal weakness.  Endo/Heme/Allergies: Negative.   Psychiatric/Behavioral: Negative.    Blood pressure 134/55, pulse 51, temperature 98.3 F (36.8 C), temperature source Oral, resp. rate 20, height $RemoveBe'5\' 5"'GHUbaFCQp$  (1.651 m), weight 81.647 kg (180 lb), SpO2 95 %. Physical Exam  Vitals reviewed. Neurological: She is alert. She has normal strength and normal reflexes. No cranial nerve deficit or sensory deficit. She displays a negative Romberg sign. GCS eye subscore is 4. GCS verbal subscore is 5. GCS motor subscore is 6. She displays no Babinski's sign on the right side. She displays no Babinski's sign on the left side.   alert, oriented 4, fluent speech no dysarthria or aphasia noted. Cranial nerve examination 2 through 12  is grossly intact. Motor examination normal tone and full strength throughout. Sensory examination showed bilaterally symmetric normal sensation to pinprick throughout in all 4 extremities and face. Deep tendon reflexes are 2+ and symmetric. Normal finger nose testing. She had an antalgic gait and prefers to walk with a cane. There was no significant gait ataxia noted.    Assessment/Plan: 81 year old female patient who presented to the emergency room for evaluation of transient symptoms of slurred speech and possible right facial weakness per history. She was asymptomatic with no focal findings when she presented to the emergency room. She had a nonfocal neurological examination during my evaluation. Given her age, cannot exclude the possibility of transient  ischemic event. She had a very similar presentation on 04/18/2015 with a negative brain MRI study. She had a stroke/TIA workup at that time with ultrasound of the carotids and TTE which were grossly unremarkable and has been taking aspirin 81 mg daily. CT of the brain done today did not show any evidence of acute pathology. Recommend obtaining a CTA of head and neck to rule out any significant vascular stenosis which could contribute to such transient ischemic symptoms. Recommend switching her antiplatelet therapy from aspirin to Plavix 75 mg daily.  Of note, both episodes of her the transient neurological symptoms today as well as on 04/18/2015 occurred after she finished swimming. The differential also includes an exertion induced cardiac arrhythmias including possible atrial fibrillation. Hence I recommend follow-up with a cardiologist within a week and undergo a long-term 30 day Holter monitoring to rule out paroxysmal Atrial fibrillation.  Discussed this in detail with the patient.   Discussed the  assessment and Plan with the ER physician Dr. Regenia Skeeter.      Domonick Sittner Inez Catalina Jozi Malachi 04/30/2015, 4:56 PM

## 2015-04-30 NOTE — ED Notes (Signed)
Doctor at bedside.

## 2015-04-30 NOTE — ED Notes (Signed)
MD at bedside, Neurology. 

## 2015-05-12 ENCOUNTER — Encounter (INDEPENDENT_AMBULATORY_CARE_PROVIDER_SITE_OTHER): Payer: Medicare Other

## 2015-05-12 DIAGNOSIS — G459 Transient cerebral ischemic attack, unspecified: Secondary | ICD-10-CM

## 2015-06-23 ENCOUNTER — Institutional Professional Consult (permissible substitution): Payer: Self-pay | Admitting: Neurology

## 2015-06-25 ENCOUNTER — Encounter: Payer: Self-pay | Admitting: Neurology

## 2015-06-25 ENCOUNTER — Ambulatory Visit (INDEPENDENT_AMBULATORY_CARE_PROVIDER_SITE_OTHER): Payer: Medicare Other | Admitting: Neurology

## 2015-06-25 VITALS — BP 125/73 | HR 57 | Ht 65.0 in | Wt 180.0 lb

## 2015-06-25 DIAGNOSIS — G451 Carotid artery syndrome (hemispheric): Secondary | ICD-10-CM | POA: Diagnosis not present

## 2015-06-25 NOTE — Patient Instructions (Signed)
Stroke Prevention Some medical conditions and behaviors are associated with an increased chance of having a stroke. You may prevent a stroke by making healthy choices and managing medical conditions. HOW CAN I REDUCE MY RISK OF HAVING A STROKE?   Stay physically active. Get at least 30 minutes of activity on most or all days.  Do not smoke. It may also be helpful to avoid exposure to secondhand smoke.  Limit alcohol use. Moderate alcohol use is considered to be:  No more than 2 drinks per day for men.  No more than 1 drink per day for nonpregnant women.  Eat healthy foods. This involves:  Eating 5 or more servings of fruits and vegetables a day.  Making dietary changes that address high blood pressure (hypertension), high cholesterol, diabetes, or obesity.  Manage your cholesterol levels.  Making food choices that are high in fiber and low in saturated fat, trans fat, and cholesterol may control cholesterol levels.  Take any prescribed medicines to control cholesterol as directed by your health care provider.  Manage your diabetes.  Controlling your carbohydrate and sugar intake is recommended to manage diabetes.  Take any prescribed medicines to control diabetes as directed by your health care provider.  Control your hypertension.  Making food choices that are low in salt (sodium), saturated fat, trans fat, and cholesterol is recommended to manage hypertension.  Ask your health care provider if you need treatment to lower your blood pressure. Take any prescribed medicines to control hypertension as directed by your health care provider.  If you are 18-39 years of age, have your blood pressure checked every 3-5 years. If you are 40 years of age or older, have your blood pressure checked every year.  Maintain a healthy weight.  Reducing calorie intake and making food choices that are low in sodium, saturated fat, trans fat, and cholesterol are recommended to manage  weight.  Stop drug abuse.  Avoid taking birth control pills.  Talk to your health care provider about the risks of taking birth control pills if you are over 35 years old, smoke, get migraines, or have ever had a blood clot.  Get evaluated for sleep disorders (sleep apnea).  Talk to your health care provider about getting a sleep evaluation if you snore a lot or have excessive sleepiness.  Take medicines only as directed by your health care provider.  For some people, aspirin or blood thinners (anticoagulants) are helpful in reducing the risk of forming abnormal blood clots that can lead to stroke. If you have the irregular heart rhythm of atrial fibrillation, you should be on a blood thinner unless there is a good reason you cannot take them.  Understand all your medicine instructions.  Make sure that other conditions (such as anemia or atherosclerosis) are addressed. SEEK IMMEDIATE MEDICAL CARE IF:   You have sudden weakness or numbness of the face, arm, or leg, especially on one side of the body.  Your face or eyelid droops to one side.  You have sudden confusion.  You have trouble speaking (aphasia) or understanding.  You have sudden trouble seeing in one or both eyes.  You have sudden trouble walking.  You have dizziness.  You have a loss of balance or coordination.  You have a sudden, severe headache with no known cause.  You have new chest pain or an irregular heartbeat. Any of these symptoms may represent a serious problem that is an emergency. Do not wait to see if the symptoms will   go away. Get medical help at once. Call your local emergency services (911 in [place] Do not drive yourself to the hospital.   This information is not intended to replace advice given to you by your health care provider. Make sure you discuss any questions you have with your health care provider.   Document Released: 07/08/2004 Document Revised: 06/21/2014 Document Reviewed:  12/01/2012 Elsevier Interactive Patient Education 2016 [employer]  

## 2015-06-25 NOTE — Progress Notes (Signed)
Reason for visit: TIA  [facility]  Nigel BridgemanKarlyn S Allen is a 82 y.o. female  History of present illness:  [name] is an 82 year old right-handed white female with a history of TIA events that occurred on 2 occasions in November 2016. The patient was sent to the emergency room for admission on 04/19/2015 with right facial droop and slurred speech. The episode lasted between 20 and 30 minutes and fully cleared. The patient indicated that the event was quite mild, she herself did not notice any problems. The patient denied any visual field changes, headache, weakness or numbness on the arms or legs. She had no change in balance or walking. The patient underwent a MRI of the brain without acute changes. A carotid Doppler study and 2-D echocardiogram are unremarkable. The patient was placed on medications for cholesterol. The patient was reevaluated on 04/30/2015 with a recurring episode of slurred speech and facial droop. Again, this was transient in nature. The patient underwent a CT angiogram of the head and neck that was unremarkable. She has been on aspirin therapy. She has not had any recurrence since being in the hospital. She also underwent a 30 day cardiac monitor study that was unremarkable. She comes in for follow-up today. She denies any new issues.  Past Medical History  Diagnosis Date  . Hypertension   . Hypothyroid   . Hyperlipemia   . Sleep apnea   . Depression   . Chronic cystitis   . Plantar fasciitis   . S/P TAH-BSO   . S/P cholecystectomy   . Hx of cardiovascular stress test     a. ETT-Echo 3/12:  EF 60%, normal study  . OSA on CPAP   . Hx of echocardiogram     a. Echo 2/14:  Mild LVH, EF 60-65%, Gr 1 diast dysfn, mild to mod AS, mean 17 mmHg, AVA 1.3 (VTI), trivial MR, mild LAE, PASP 31   . Aortic stenosis     a. mild to mod by Echo 07/2012  . Palpitations     a. event monitor 3/14:  NSR, sinus brady  . Allergy   . Arthritis   . Gait  disturbance 06/10/2014  . TIA (transient ischemic attack)     Past Surgical History  Procedure Laterality Date  . Cholecystectomy    . Abdominal hysterectomy    . Incontinence surgery    . Tonsillectomy      Family History  Problem Relation Age of Onset  . Heart attack Mother 3380  . Heart failure Mother   . Lung disease Father   . Cancer - Prostate Father   . Healthy Brother   . Healthy Brother     Social history:  reports that she has quit smoking. She has never used smokeless tobacco. She reports that she does not drink alcohol or use illicit drugs.  Medications:  Prior to Admission medications   Medication Sig Start Date End Date Taking? Authorizing Provider  aspirin 81 MG tablet Take 81 mg by mouth daily.   Yes Historical Provider, MD  atorvastatin (LIPITOR) 40 MG tablet Take 1 tablet (40 mg total) by mouth daily at 6 PM. 04/20/15  Yes Carly J Rivet, MD  BIOTIN PO Take 1 capsule by mouth daily.   Yes Historical Provider, MD  Ergocalciferol (VITAMIN D2 PO) Take 1 capsule by mouth daily.   Yes Historical Provider, MD  felodipine (PLENDIL) 5 MG 24 hr tablet Take 5 mg by mouth daily.  Yes Historical Provider, MD  FLUZONE HIGH-DOSE 0.5 ML SUSY  03/24/14  Yes Historical Provider, MD  indapamide (LOZOL) 2.5 MG tablet Take 2.5 mg by mouth daily.   Yes Historical Provider, MD  levothyroxine (SYNTHROID, LEVOTHROID) 50 MCG tablet Take 50 mcg by mouth every evening.    Yes Historical Provider, MD  methenamine (UREX) 1 G tablet Take 1 g by mouth 2 (two) times daily with a meal.   Yes Historical Provider, MD  rosuvastatin (CRESTOR) 20 MG tablet Take 20 mg by mouth daily.   Yes Historical Provider, MD      Allergies  Allergen Reactions  . Metronidazole And Related     unknown  . Ciprofloxacin Other (See Comments)    Reaction unknown  . Etodolac Other (See Comments)    Reaction unknown  . Naproxen Other (See Comments)    Reaction unknown  . Nitrofurantoin Other (See Comments)     Reaction unknown  . Sulfonamide Derivatives Other (See Comments)    Reaction unknown  . Alprazolam Rash  . Epinephrine Palpitations  . Macrobid [Nitrofurantoin Macrocrystal] Rash    ROS:  Out of a complete 14 system review of symptoms, the patient complains only of the following symptoms, and all other reviewed systems are negative.  Heart murmur, swelling in the legs Blurred vision Snoring Urination problems, urinary incontinence Slurred speech Joint pain Allergies  Blood pressure 125/73, pulse 57, height 5\' 5"  (1.651 m), weight 180 lb (81.647 kg).  Physical Exam  General: The patient is alert and cooperative at the time of the examination.  Eyes: Pupils are equal, round, and reactive to light. Discs are flat bilaterally.  Neck: The neck is supple, no carotid bruits are noted.  Respiratory: The respiratory examination is clear.  Cardiovascular: The cardiovascular examination reveals a regular rate and rhythm, no obvious murmurs or rubs are noted.  Skin: Extremities are without significant edema.  Neurologic Exam  Mental status: The patient is alert and oriented x 3 at the time of the examination. The patient has apparent normal recent and remote memory, with an apparently normal attention span and concentration ability.  Cranial nerves: Facial symmetry is not present. There is some depression of the right nasolabial fold. There is good sensation of the face to pinprick and soft touch bilaterally. The strength of the facial muscles and the muscles to head turning and shoulder shrug are normal bilaterally. Speech is well enunciated, no aphasia or dysarthria is noted. Extraocular movements are full. Visual fields are full. The tongue is midline, and the patient has symmetric elevation of the soft palate. No obvious hearing deficits are noted.  Motor: The motor testing reveals 5 over 5 strength of all 4 extremities. Good symmetric motor tone is noted throughout.  Sensory:  Sensory testing is intact to pinprick, soft touch, vibration sensation, and position sense on all 4 extremities. No evidence of extinction is noted.  Coordination: Cerebellar testing reveals good finger-nose-finger and heel-to-shin bilaterally.  Gait and station: Gait is normal. Tandem gait is unsteady. Romberg is negative. No drift is seen.  Reflexes: Deep tendon reflexes are symmetric and normal bilaterally. Toes are downgoing bilaterally.   MRI brain 04/30/15:  IMPRESSION: No acute intracranial abnormality.  Stable noncontrast MRI appearance of the brain since 2015, with mild for age white matter signal changes.  * MRI scan images were reviewed online. I agree with the written report.   CTA head and neck 04/30/15:  IMPRESSION: Mild atherosclerotic disease in the carotid bifurcation bilaterally. No  significant carotid or vertebral artery stenosis in the neck.  No significant intracranial stenosis.   2D echo 04/19/15:  Study Conclusions  - Left ventricle: The cavity size was normal. Wall thickness was increased in a pattern of mild LVH. Systolic function was normal. The estimated ejection fraction was in the range of 55% to 60%. Doppler parameters are consistent with abnormal left ventricular relaxation (grade 1 diastolic dysfunction). - Left atrium: The atrium was mildly to moderately dilated. - Right atrium: The atrium was mildly dilated. - Pulmonary arteries: PA peak pressure: 37 mm Hg (S). - Impressions: Very limited echo due to poor sound wave transmission.  Impressions:  - Very limited echo due to poor sound wave transmission.   Carotid doppler 04/19/15:  Summary: Findings suggest 1-39% internal carotid artery stenosis bilaterally. Vertebral arteries are patent with antegrade flow.    Assessment/Plan:  1. TIA events  2. Dyslipidemia  The patient appears to have some chronic issues with depression of the right nasolabial fold. The patient  has had 2 events of slurring of speech in November, she has had no recurrences since that time. The patient is on low-dose aspirin, she will remain on this. She will follow-up through this office on an as-needed basis. She has had a very thorough cerebrovascular disease workup that was relatively unremarkable.  Marlan Palau MD 06/25/2015 6:58 PM  [facility] [address] [place], [place] [address]  Phone [phone] Fax [phone]

## 2015-07-27 ENCOUNTER — Other Ambulatory Visit: Payer: Self-pay | Admitting: Internal Medicine

## 2015-07-28 ENCOUNTER — Other Ambulatory Visit: Payer: Self-pay | Admitting: Internal Medicine

## 2015-07-30 ENCOUNTER — Other Ambulatory Visit: Payer: Self-pay | Admitting: Internal Medicine

## 2015-08-01 ENCOUNTER — Other Ambulatory Visit: Payer: Self-pay | Admitting: Internal Medicine

## 2015-08-04 ENCOUNTER — Other Ambulatory Visit: Payer: Self-pay | Admitting: Internal Medicine

## 2016-04-28 ENCOUNTER — Encounter: Payer: Self-pay | Admitting: *Deleted

## 2016-04-28 ENCOUNTER — Ambulatory Visit (INDEPENDENT_AMBULATORY_CARE_PROVIDER_SITE_OTHER): Payer: Medicare Other | Admitting: Cardiology

## 2016-04-28 VITALS — BP 140/70 | HR 52 | Ht 65.0 in | Wt 180.6 lb

## 2016-04-28 DIAGNOSIS — R002 Palpitations: Secondary | ICD-10-CM

## 2016-04-28 NOTE — Patient Instructions (Addendum)
Medication Instructions:  Your physician recommends that you continue on your current medications as directed. Please refer to the Current Medication list given to you today.   Labwork: none  Testing/Procedures: Your physician has recommended that you wear an event monitor. Event monitors are medical devices that record the heart's electrical activity. Doctors most often us these monitors to diagnose arrhythmias. Arrhythmias are problems with the speed or rhythm of the heartbeat. The monitor is a small, portable device. You can wear one while you do your normal daily activities. This is usually used to diagnose what is causing palpitations/syncope (passing out).    Follow-Up: Your physician recommends that you schedule a follow-up appointment in: 6 weeks    Any Other Special Instructions Will Be Listed Below (If Applicable).     If you need a refill on your cardiac medications before your next appointment, please call your pharmacy.   

## 2016-04-28 NOTE — Progress Notes (Signed)
Electrophysiology Office Note   Date:  04/28/2016   ID:  Autumn Allen, DOB 1929-11-30, MRN [id number]  PCP:  Minda Meo, MD  Primary Electrophysiologist:  Regan Lemming, MD    Chief Complaint  Patient presents with  . New Patient (Initial Visit)    transient ischemic attack/Palpitations     History of Present Illness: Autumn Allen is a 82 y.o. female who presents today for electrophysiology evaluation.   History of hypertension, hyperlipidemia, TIA, obstructive sleep apnea, palpitations. She says that her TIA occurred last fall. This is her second TIA. When she had the hospital, her symptoms had resolved. She does have episodes of palpitations. They occur once every 2-3 weeks and mainly in the morning. She says that she can feel her heart racing, but does not have shortness of breath or fatigue. Otherwise she feels well without any major complaint.   Today, she denies symptoms of palpitations, chest pain, shortness of breath, orthopnea, PND, lower extremity edema, claudication, dizziness, presyncope, syncope, bleeding, or neurologic sequela. The patient is tolerating medications without difficulties and is otherwise without complaint today.    Past Medical History:  Diagnosis Date  . Allergy   . Aortic stenosis    a. mild to mod by Echo 07/2012  . Arthritis   . Chronic cystitis   . Depression   . Gait disturbance 06/10/2014  . Hx of cardiovascular stress test    a. ETT-Echo 3/12:  EF 60%, normal study  . Hx of echocardiogram    a. Echo 2/14:  Mild LVH, EF 60-65%, Gr 1 diast dysfn, mild to mod AS, mean 17 mmHg, AVA 1.3 (VTI), trivial MR, mild LAE, PASP 31   . Hyperlipemia   . Hypertension   . Hypothyroid   . OSA on CPAP   . Palpitations    a. event monitor 3/14:  NSR, sinus brady  . Plantar fasciitis   . S/P cholecystectomy   . S/P TAH-BSO   . Sleep apnea   . TIA (transient ischemic attack)    Past Surgical History:  Procedure Laterality Date   . ABDOMINAL HYSTERECTOMY    . CHOLECYSTECTOMY    . INCONTINENCE SURGERY    . TONSILLECTOMY       Current Outpatient Prescriptions  Medication Sig Dispense Refill  . aspirin 81 MG tablet Take 81 mg by mouth daily.    Marland Kitchen BIOTIN PO Take 1 capsule by mouth daily.    . Ergocalciferol (VITAMIN D2 PO) Take 1 capsule by mouth daily.    . felodipine (PLENDIL) 5 MG 24 hr tablet Take 5 mg by mouth daily.    Marland Kitchen FLUZONE HIGH-DOSE 0.5 ML SUSY     . indapamide (LOZOL) 2.5 MG tablet Take 2.5 mg by mouth daily.    Marland Kitchen levothyroxine (SYNTHROID, LEVOTHROID) 50 MCG tablet Take 50 mcg by mouth every evening.     . methenamine (UREX) 1 G tablet Take 1 g by mouth 2 (two) times daily with a meal.    . rosuvastatin (CRESTOR) 20 MG tablet Take 20 mg by mouth daily.     No current facility-administered medications for this visit.     Allergies:   Metronidazole and related; Ciprofloxacin; Etodolac; Naproxen; Nitrofurantoin; Sulfonamide derivatives; Alprazolam; Epinephrine; and Macrobid [nitrofurantoin macrocrystal]   Social History:  The patient  reports that she has quit smoking. She has never used smokeless tobacco. She reports that she does not drink alcohol or use drugs.   Family History:  The patient's  family history includes Cancer - Prostate in her father; Healthy in her brother and brother; Heart attack (age of onset: 7580) in her mother; Heart failure in her mother; Lung disease in her father.    ROS:  Please see the history of present illness.   Otherwise, review of systems is positive for Leg pain, palpitations, hearing loss, dyspnea on exertion, snoring, back and muscle pain, balance problems, easy bruising.   All other systems are reviewed and negative.    PHYSICAL EXAM: VS:  BP 140/70   Pulse (!) 52   Ht 5\' 5"  (1.651 m)   Wt 180 lb 9.6 oz (81.9 kg)   BMI 30.05 kg/m  , BMI Body mass index is 30.05 kg/m. GEN: Well nourished, well developed, in no acute distress  HEENT: normal  Neck: no JVD,  carotid bruits, or masses Cardiac: RRR; no murmurs, rubs, or gallops,no edema  Respiratory:  clear to auscultation bilaterally, normal work of breathing GI: soft, nontender, nondistended, + BS MS: no deformity or atrophy  Skin: warm and dry Neuro:  Strength and sensation are intact Psych: euthymic mood, full affect  EKG:  EKG is ordered today. Personal review of the ekg ordered shows sinus rhythm, rate 52, inferior Q waves  Recent Labs: 04/30/2015: ALT 23; BUN 22; Creatinine, Ser 0.90; Hemoglobin 16.7; Platelets 239; Potassium 3.9; Sodium 141    Lipid Panel     Component Value Date/Time   CHOL 218 (H) 04/19/2015 0615   TRIG 224 (H) 04/19/2015 0615   HDL 40 (L) 04/19/2015 0615   CHOLHDL 5.5 04/19/2015 0615   VLDL 45 (H) 04/19/2015 0615   LDLCALC 133 (H) 04/19/2015 0615   LDLDIRECT 135.1 08/26/2010 0844     Wt Readings from Last 3 Encounters:  04/28/16 180 lb 9.6 oz (81.9 kg)  06/25/15 180 lb (81.6 kg)  04/30/15 180 lb (81.6 kg)      Other studies Reviewed: Additional studies/ records that were reviewed today include: 11/16 TTE  Review of the above records today demonstrates:  - Left ventricle: The cavity size was normal. Wall thickness was   increased in a pattern of mild LVH. Systolic function was normal.   The estimated ejection fraction was in the range of 55% to 60%.   Doppler parameters are consistent with abnormal left ventricular   relaxation (grade 1 diastolic dysfunction). - Left atrium: The atrium was mildly to moderately dilated. - Right atrium: The atrium was mildly dilated. - Pulmonary arteries: PA peak pressure: 37 mm Hg (S). - Impressions: Very limited echo due to poor sound wave   transmission.  Telemetry 05/12/15 Sinus bradycardia with pac and pvc  ASSESSMENT AND PLAN:  1.  Palpitations: Currently, it is difficult to tell as to the cause of her palpitations. Her EKG does not show any evidence of conduction system disease. We'll therefore fit her  with a 30 day monitor. I have told her that if the monitor does not show any evidence of arrhythmia, that she may benefit from Linq monitoring.   2. Hypertension: At the high end of normal, continue current treatment  3. Hyperlipidemia: continue Crestor  4. OSA on CPAP  Current medicines are reviewed at length with the patient today.   The patient does not have concerns regarding her medicines.  The following changes were made today:  none  Labs/ tests ordered today include:  Orders Placed This Encounter  Procedures  . Cardiac event monitor  . EKG 12-Lead     Disposition:  FU with Will Camnitz 6 weeks  Signed, Will Jorja LoaMartin Camnitz, MD  04/28/2016 2:34 PM     [facility] [address] [place] [id number] [phone] (office) [phone] (fax)

## 2016-05-03 ENCOUNTER — Ambulatory Visit (INDEPENDENT_AMBULATORY_CARE_PROVIDER_SITE_OTHER): Payer: Medicare Other

## 2016-05-03 DIAGNOSIS — R002 Palpitations: Secondary | ICD-10-CM | POA: Diagnosis not present

## 2016-05-17 ENCOUNTER — Telehealth: Payer: Self-pay | Admitting: Cardiology

## 2016-05-17 NOTE — Telephone Encounter (Signed)
New Message  Pt call requesting to speak with RN. Pt states she will be going out of town for 4 days and states it will be a lot of trouble to bring both heart monitor machine and sleep apnea machine. Pt would like to speak with RN to know if she would be okay to leave the heart monitor home while out of town. Please call back to discuss

## 2016-05-18 NOTE — Telephone Encounter (Signed)
Advised patient to call monitor company and inform them of her plans.  They will either grant an extension for the 4 days, or just miss the 4 days of monitoring. She will call them to discuss. She thanks me for calling and explaining. She also wants to thank Dr. Elberta Fortisamnitz again.

## 2016-05-24 ENCOUNTER — Telehealth: Payer: Self-pay | Admitting: Cardiology

## 2016-05-24 NOTE — Telephone Encounter (Signed)
Pt updating office.  She does not need anything from us at this time.  She thanks me for calling.

## 2016-05-24 NOTE — Telephone Encounter (Signed)
New message  Pt had permission from Dr. Elberta Fortisamnitz to be off of monitor for 4 days  Pt came back and monitor was malfunctioning  Pt has mailed monitor back to company and is waiting for new monitor  Will call back and resume once receive new monitor

## 2016-05-24 NOTE — Telephone Encounter (Signed)
New Message  Pt voiced camnitz ordered for pt to take monitor off, found machine was malfunctioning when returned home, called company & diagnosed as malfunctioning. Will be delay on new machine but will call once new machine is received.  Please f/u

## 2016-05-28 ENCOUNTER — Telehealth: Payer: Self-pay | Admitting: Cardiology

## 2016-05-28 NOTE — Telephone Encounter (Signed)
New message  Pt states she is on her second monitor and it is malfunctioning  Please call her back

## 2016-05-28 NOTE — Telephone Encounter (Signed)
Called patient and she has already called the company and is waiting on new supplies/monitor. After she gets the new stuff she is going to put the monitor back. She will call the company to find out when her End of service is due to all the problems and wanting a full 30 days.

## 2016-06-15 ENCOUNTER — Encounter: Payer: Self-pay | Admitting: *Deleted

## 2016-06-15 ENCOUNTER — Other Ambulatory Visit: Payer: Self-pay | Admitting: Cardiology

## 2016-06-15 ENCOUNTER — Ambulatory Visit (INDEPENDENT_AMBULATORY_CARE_PROVIDER_SITE_OTHER): Payer: Medicare Other | Admitting: Cardiology

## 2016-06-15 ENCOUNTER — Encounter: Payer: Self-pay | Admitting: Cardiology

## 2016-06-15 VITALS — BP 130/58 | HR 54 | Ht 65.0 in | Wt 184.4 lb

## 2016-06-15 DIAGNOSIS — R002 Palpitations: Secondary | ICD-10-CM | POA: Diagnosis not present

## 2016-06-15 NOTE — Patient Instructions (Addendum)
Medication Instructions:    Your physician recommends that you continue on your current medications as directed. Please refer to the Current Medication list given to you today.  --- If you need a refill on your cardiac medications before your next appointment, please call your pharmacy. ---  Labwork:  None ordered  Testing/Procedures: Your physician has recommended that you have a loop recorder inserted.  Please see the instruction sheet given to you today for more information.  Follow-Up:  Your physician recommends that you schedule a follow-up appointment in: 7-10 days after your procedure on 06/29/2016, with device clinic for a wound check.   Your physician wants you to follow-up in: 1 year with Dr. Elberta Fortisamnitz.  You will receive a reminder letter in the mail two months in advance. If you don't receive a letter, please call our office to schedule the follow-up appointment.  Thank you for choosing [facility]!!   Dory HornSherri Eyoel Throgmorton, RN [phone]

## 2016-06-15 NOTE — Progress Notes (Signed)
Electrophysiology Office Note   Date:  06/15/2016   ID:  Autumn BridgemanKarlyn S Pfarr, DOB 02/19/1930, MRN [id number]  PCP:  Minda MeoARONSON,RICHARD A, MD  Primary Electrophysiologist:  Regan LemmingWill Martin Camnitz, MD    Chief Complaint  Patient presents with  . Palpitations     History of Present Illness: Autumn Allen is a 83 y.o. female who presents today for electrophysiology evaluation.   History of hypertension, hyperlipidemia, TIA, obstructive sleep apnea, palpitations. She wore a 30 day monitor that showed no evidence of tachycardia or rhythm abnormality. She is continuing to have episodes of palpitations. Her palpitations have been mainly in the morning when she is up and moving around.   Today, she denies symptoms of palpitations, chest pain, shortness of breath, orthopnea, PND, lower extremity edema, claudication, dizziness, presyncope, syncope, bleeding, or neurologic sequela. The patient is tolerating medications without difficulties and is otherwise without complaint today.    Past Medical History:  Diagnosis Date  . Allergy   . Aortic stenosis    a. mild to mod by Echo 07/2012  . Arthritis   . Chronic cystitis   . Depression   . Gait disturbance 06/10/2014  . Hx of cardiovascular stress test    a. ETT-Echo 3/12:  EF 60%, normal study  . Hx of echocardiogram    a. Echo 2/14:  Mild LVH, EF 60-65%, Gr 1 diast dysfn, mild to mod AS, mean 17 mmHg, AVA 1.3 (VTI), trivial MR, mild LAE, PASP 31   . Hyperlipemia   . Hypertension   . Hypothyroid   . OSA on CPAP   . Palpitations    a. event monitor 3/14:  NSR, sinus brady  . Plantar fasciitis   . S/P cholecystectomy   . S/P TAH-BSO   . Sleep apnea   . TIA (transient ischemic attack)    Past Surgical History:  Procedure Laterality Date  . ABDOMINAL HYSTERECTOMY    . CHOLECYSTECTOMY    . INCONTINENCE SURGERY    . TONSILLECTOMY       Current Outpatient Prescriptions  Medication Sig Dispense Refill  . aspirin 81 MG tablet Take 81  mg by mouth daily.    Marland Kitchen. BIOTIN PO Take 1 capsule by mouth daily.    . Ergocalciferol (VITAMIN D2 PO) Take 1 capsule by mouth daily.    . felodipine (PLENDIL) 5 MG 24 hr tablet Take 5 mg by mouth daily.    . indapamide (LOZOL) 2.5 MG tablet Take 2.5 mg by mouth daily.    Marland Kitchen. levothyroxine (SYNTHROID, LEVOTHROID) 50 MCG tablet Take 50 mcg by mouth every evening.     . methenamine (UREX) 1 G tablet Take 1 g by mouth 2 (two) times daily with a meal.    . rosuvastatin (CRESTOR) 20 MG tablet Take 20 mg by mouth daily.     No current facility-administered medications for this visit.     Allergies:   Metronidazole and related; Ciprofloxacin; Etodolac; Naproxen; Nitrofurantoin; Sulfonamide derivatives; Alprazolam; Epinephrine; and Macrobid [nitrofurantoin macrocrystal]   Social History:  The patient  reports that she has quit smoking. She has never used smokeless tobacco. She reports that she does not drink alcohol or use drugs.   Family History:  The patient's family history includes Cancer - Prostate in her father; Healthy in her brother and brother; Heart attack (age of onset: 7280) in her mother; Heart failure in her mother; Lung disease in her father.    ROS:  Please see the history of present illness.  Otherwise, review of systems is positive forLeg pain, palpitations, hearing loss, visual disturbance, snoring, balance problems, back pain, muscle pain, easy bruising.   All other systems are reviewed and negative.    PHYSICAL EXAM: VS:  BP (!) 130/58   Pulse (!) 54   Ht 5\' 5"  (1.651 m)   Wt 184 lb 6.4 oz (83.6 kg)   SpO2 94%   BMI 30.69 kg/m  , BMI Body mass index is 30.69 kg/m. GEN: Well nourished, well developed, in no acute distress  HEENT: normal  Neck: no JVD, carotid bruits, or masses Cardiac: RRR; no murmurs, rubs, or gallops,no edema  Respiratory:  clear to auscultation bilaterally, normal work of breathing GI: soft, nontender, nondistended, + BS MS: no deformity or atrophy    Skin: warm and dry Neuro:  Strength and sensation are intact Psych: euthymic mood, full affect  EKG:  EKG is not ordered today. Personal review of the ekg ordered 04/28/16 shows sinus rhythm, rate 52, inferior Q waves  Recent Labs: No results found for requested labs within last 8760 hours.    Lipid Panel     Component Value Date/Time   CHOL 218 (H) 04/19/2015 0615   TRIG 224 (H) 04/19/2015 0615   HDL 40 (L) 04/19/2015 0615   CHOLHDL 5.5 04/19/2015 0615   VLDL 45 (H) 04/19/2015 0615   LDLCALC 133 (H) 04/19/2015 0615   LDLDIRECT 135.1 08/26/2010 0844     Wt Readings from Last 3 Encounters:  06/15/16 184 lb 6.4 oz (83.6 kg)  04/28/16 180 lb 9.6 oz (81.9 kg)  06/25/15 180 lb (81.6 kg)      Other studies Reviewed: Additional studies/ records that were reviewed today include: 11/16 TTE  Review of the above records today demonstrates:  - Left ventricle: The cavity size was normal. Wall thickness was   increased in a pattern of mild LVH. Systolic function was normal.   The estimated ejection fraction was in the range of 55% to 60%.   Doppler parameters are consistent with abnormal left ventricular   relaxation (grade 1 diastolic dysfunction). - Left atrium: The atrium was mildly to moderately dilated. - Right atrium: The atrium was mildly dilated. - Pulmonary arteries: PA peak pressure: 37 mm Hg (S). - Impressions: Very limited echo due to poor sound wave   transmission.  Telemetry 05/12/15 Sinus bradycardia with pac and pvc  Telemetry 05/12/16 Sinus rhythm with episodes of artifact No evidence of arrhythmia  ASSESSMENT AND PLAN:  1.  Palpitations: Currently, it is difficult to tell as to the cause of her palpitations. Her EKG does not show any evidence of conduction system disease, and her telemetry monitor shows no evidence of rhythm abnormality. We will plan to place a Linq monitor to evaluate for any further episodes of arrhythmia. This will also help to monitor  for atrial fibrillation after her TIAs.  2. Hypertension: At the high end of normal, continue current treatment  3. Hyperlipidemia: continue Crestor  4. OSA on CPAP  Current medicines are reviewed at length with the patient today.   The patient does not have concerns regarding her medicines.  The following changes were made today:  none  Labs/ tests ordered today include:  No orders of the defined types were placed in this encounter.    Disposition:   FU with Will Camnitz 6 weeks  Signed, Will Jorja Loa, MD  06/15/2016 12:08 PM     [facility] [address] [place] [address] (  [phone] (office) [phone] (fax)

## 2016-06-29 ENCOUNTER — Ambulatory Visit (HOSPITAL_COMMUNITY)
Admission: RE | Admit: 2016-06-29 | Discharge: 2016-06-29 | Disposition: A | Discharge status: Home / Self Care | Payer: Medicare Other | Source: Ambulatory Visit | Attending: Cardiology | Admitting: Cardiology

## 2016-06-29 ENCOUNTER — Encounter (HOSPITAL_COMMUNITY)
Admission: RE | Disposition: A | Discharge status: Home / Self Care | Payer: Self-pay | Source: Ambulatory Visit | Attending: Cardiology

## 2016-06-29 ENCOUNTER — Telehealth: Payer: Self-pay | Admitting: Cardiology

## 2016-06-29 ENCOUNTER — Encounter (HOSPITAL_COMMUNITY): Payer: Self-pay | Admitting: Cardiology

## 2016-06-29 DIAGNOSIS — R002 Palpitations: Secondary | ICD-10-CM | POA: Diagnosis not present

## 2016-06-29 DIAGNOSIS — G459 Transient cerebral ischemic attack, unspecified: Secondary | ICD-10-CM | POA: Insufficient documentation

## 2016-06-29 HISTORY — PX: EP IMPLANTABLE DEVICE: SHX172B

## 2016-06-29 SURGERY — LOOP RECORDER INSERTION

## 2016-06-29 MED ORDER — LIDOCAINE-EPINEPHRINE 1 %-1:100000 IJ SOLN
INTRAMUSCULAR | Status: AC
  Filled 2016-06-29: qty 1

## 2016-06-29 MED ORDER — LIDOCAINE-EPINEPHRINE 1 %-1:100000 IJ SOLN
INTRAMUSCULAR | Status: DC | PRN
  Administered 2016-06-29: 15 mL

## 2016-06-29 SURGICAL SUPPLY — 2 items
LOOP REVEAL LINQSYS (Prosthesis & Implant Heart) ×3 IMPLANT
PACK LOOP INSERTION (CUSTOM PROCEDURE TRAY) ×3 IMPLANT

## 2016-06-29 NOTE — H&P (Signed)
Autumn BridgemanKarlyn S Allen is a 83 y.o. female with a history of palpitations. She has had multiple TIAs in the past as well.  She has worn a 30 day monitor without evidence for arrhythmia.  She presents today for implant of LINQ monitor.  Risks and benefits discussed.  Risks include but not limited to bleeding, infection. She understands the risks and has agreed to the procedure.  Gearline Spilman Elberta Fortisamnitz, MD 06/29/2016 9:46 AM

## 2016-07-02 ENCOUNTER — Telehealth: Payer: Self-pay | Admitting: Cardiology

## 2016-07-02 NOTE — Telephone Encounter (Signed)
Autumn Allen is requesting a callback regarding her procedure done on 06-29-16, please call. Thanks.

## 2016-07-05 NOTE — Telephone Encounter (Signed)
Pt inquiring when she can start swimming again.  States she swims 3x/week.  Informed pt that we would address at wound check appt on Wednesday. She also was asking about the "white box" (home monitor).  Advised pt to bring with her to wound check appt Wednesday and we would go over instructions for home monitoring. Patient verbalized understanding and agreeable to plan.

## 2016-07-07 ENCOUNTER — Ambulatory Visit (INDEPENDENT_AMBULATORY_CARE_PROVIDER_SITE_OTHER): Payer: Medicare Other | Admitting: *Deleted

## 2016-07-07 DIAGNOSIS — R002 Palpitations: Secondary | ICD-10-CM

## 2016-07-07 NOTE — Progress Notes (Signed)
Wound Loop check in clinic. Steri-strips removed, wound well healed edges approximated, no redness or swelling. Battery status: good R-waves 0.41 mV. 8 symptom episodes- EGMs PACs. 0 tachy episodes, 1 pause episodes - implant of device, 0 brady episodes. 0 AF episodes. Monthly summary reports and ROV with WC PRN, pt educated about wound care and home monitoring.

## 2016-07-14 IMAGING — CR DG KNEE COMPLETE 4+V*L*
4 series · 4 of 4 positions shown · non-contrast
Comparison: None.

CLINICAL DATA: 84-year-old female with a history of knee pain.

EXAM:
LEFT KNEE - COMPLETE 4+ VIEW; RIGHT KNEE - COMPLETE 4+ VIEW

[view not recorded (1 of 4)]
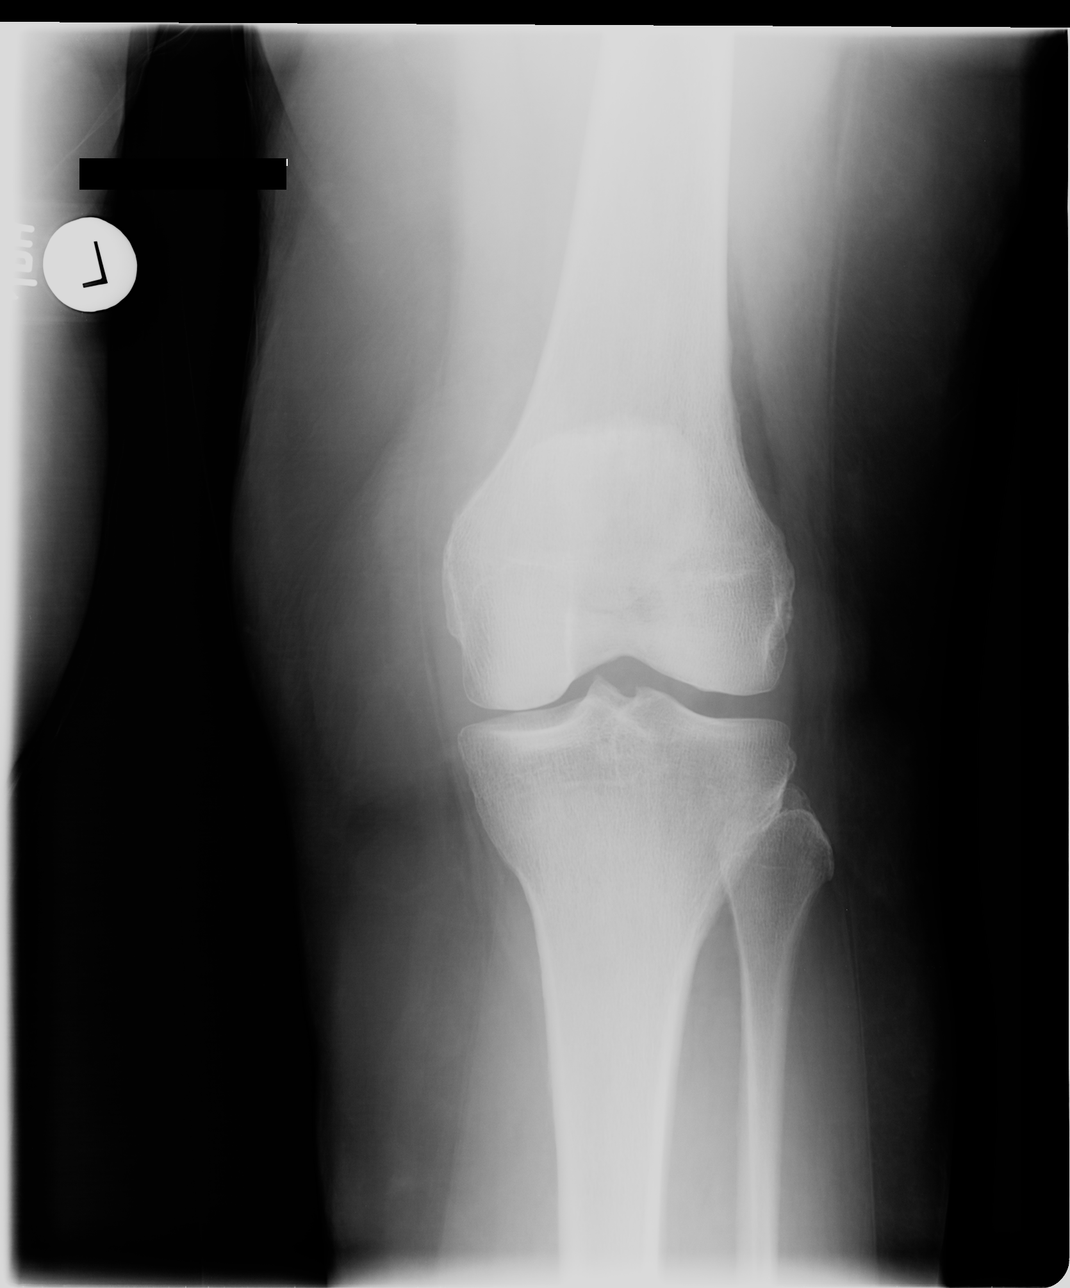

[view not recorded (2 of 4)]
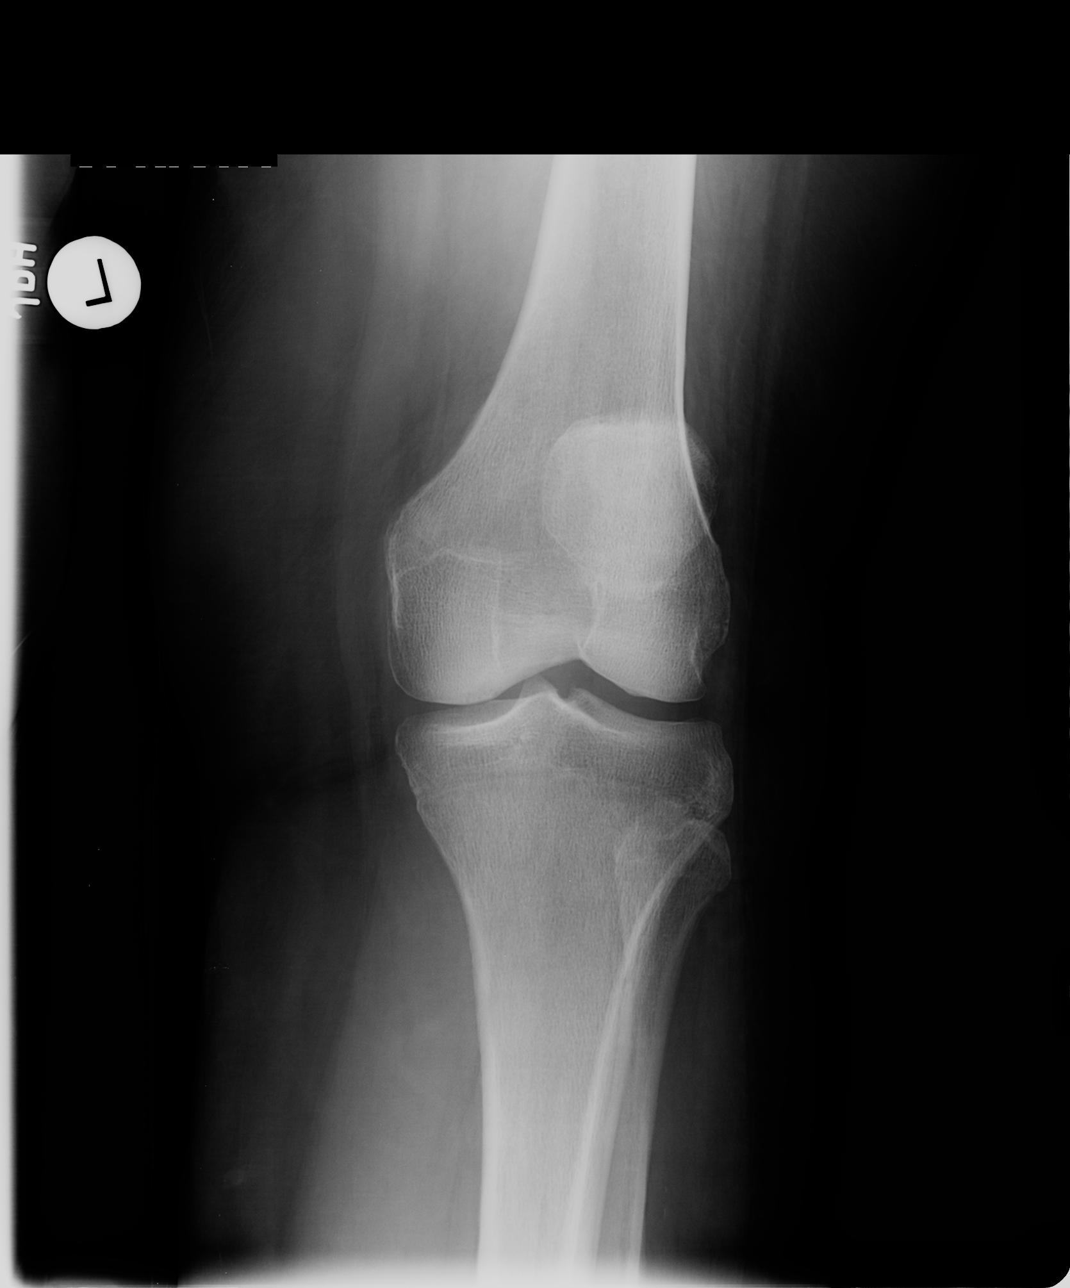

[view not recorded (3 of 4)]
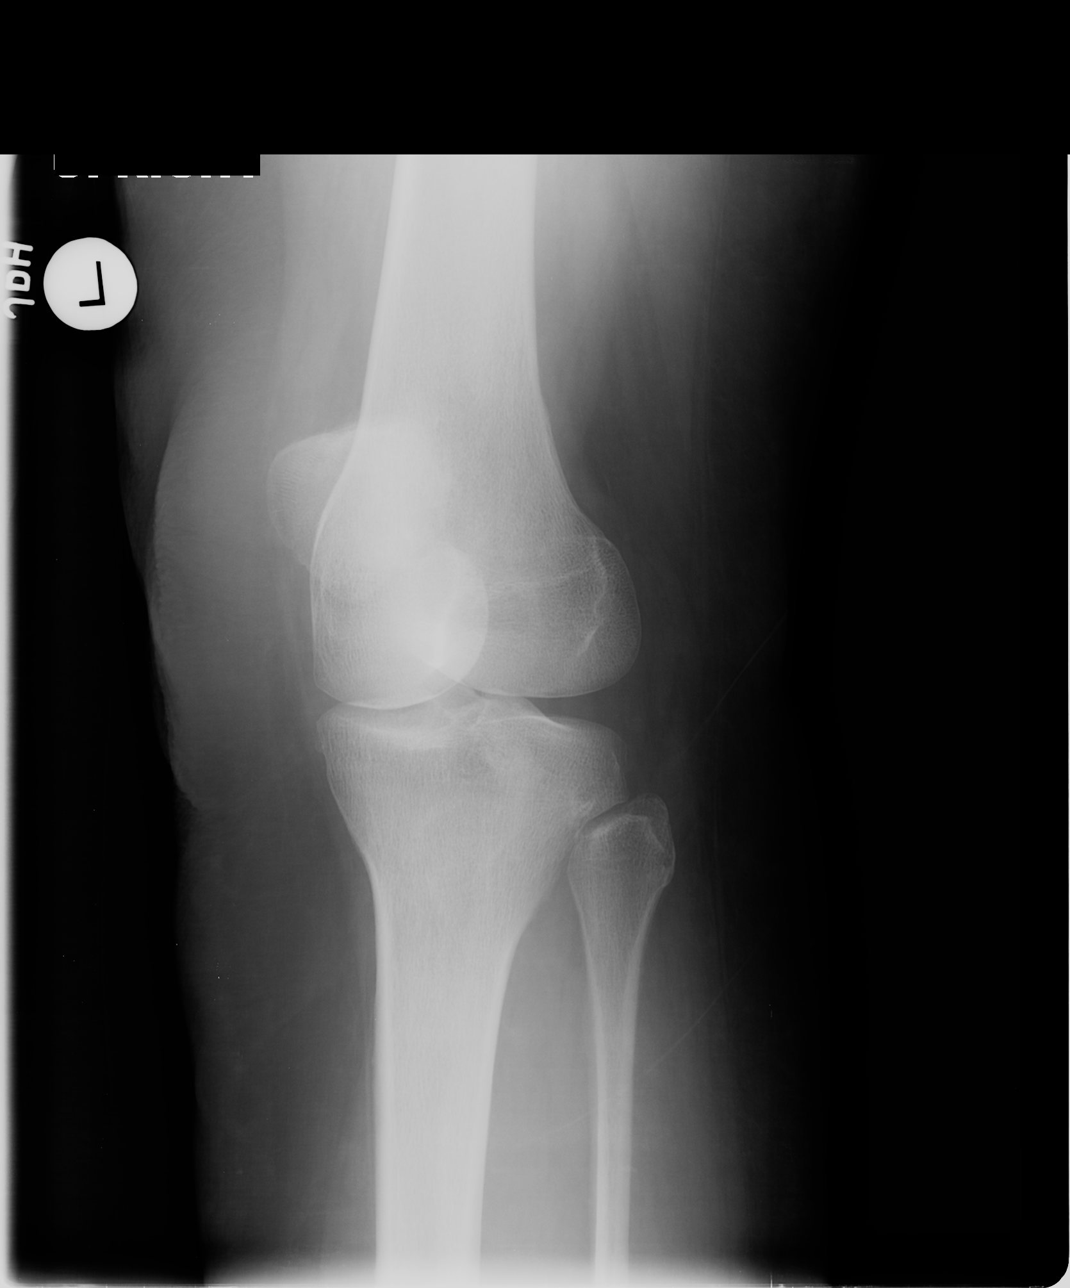

[view not recorded (4 of 4)]
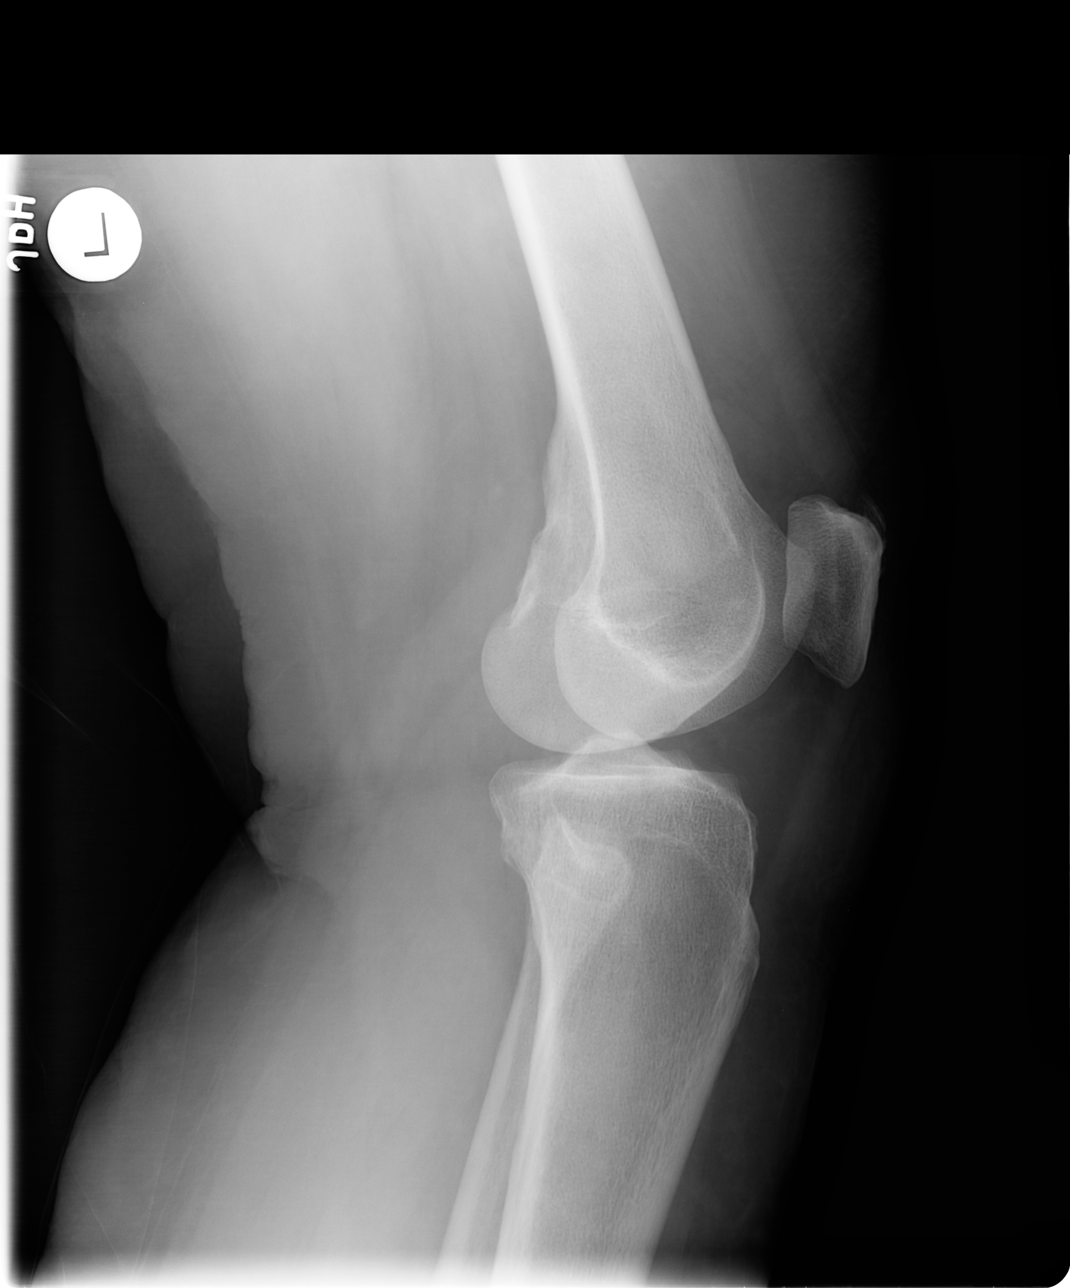

[4 of 4 positions shown; findings below may reference images not displayed]

FINDINGS: Left:

No acute bony abnormality. No significant soft tissue swelling. No
radiopaque foreign body. No joint effusion.

Medial greater than lateral joint space narrowing on the AP view.
Enthesophyte formation at the patella.

Right:

No acute bony abnormality. No significant soft tissue swelling. No
radiopaque foreign body. No joint effusion. Medial greater than
lateral joint space narrowing on the AP view. Enthesophyte formation
at the patella
IMPRESSION: No acute abnormality identified of the right or left knee.

## 2016-07-29 ENCOUNTER — Ambulatory Visit (INDEPENDENT_AMBULATORY_CARE_PROVIDER_SITE_OTHER): Payer: Medicare Other | Admitting: *Deleted

## 2016-07-29 DIAGNOSIS — R002 Palpitations: Secondary | ICD-10-CM

## 2016-08-02 NOTE — Progress Notes (Signed)
Carelink Summary Report / Loop Recorder 

## 2016-08-03 ENCOUNTER — Other Ambulatory Visit: Payer: Self-pay | Admitting: Cardiology

## 2016-08-19 LAB — CUP PACEART REMOTE DEVICE CHECK
MDC IDC PG IMPLANT DT: 20180116
MDC IDC SESS DTM: 20180215143645

## 2016-08-30 ENCOUNTER — Ambulatory Visit (INDEPENDENT_AMBULATORY_CARE_PROVIDER_SITE_OTHER): Payer: Medicare Other | Admitting: *Deleted

## 2016-08-30 DIAGNOSIS — R002 Palpitations: Secondary | ICD-10-CM | POA: Diagnosis not present

## 2016-08-30 NOTE — Progress Notes (Signed)
Carelink Summary Report / Loop Recorder 

## 2016-09-10 LAB — CUP PACEART REMOTE DEVICE CHECK
Implantable Pulse Generator Implant Date: 20180116
MDC IDC SESS DTM: 20180317143552

## 2016-09-10 NOTE — Progress Notes (Signed)
Carelink summary report received. Battery status OK. Normal device function. No new symptom episodes, tachy episodes, brady, or pause episodes. No new AF episodes. Monthly summary reports and ROV/PRN 

## 2016-09-27 ENCOUNTER — Ambulatory Visit (INDEPENDENT_AMBULATORY_CARE_PROVIDER_SITE_OTHER): Payer: Medicare Other | Admitting: *Deleted

## 2016-09-27 DIAGNOSIS — R002 Palpitations: Secondary | ICD-10-CM | POA: Diagnosis not present

## 2016-09-27 NOTE — Progress Notes (Signed)
Carelink Summary Report / Loop Recorder 

## 2016-10-10 LAB — CUP PACEART REMOTE DEVICE CHECK
Implantable Pulse Generator Implant Date: 20180116
MDC IDC SESS DTM: 20180416144133

## 2016-10-10 NOTE — Progress Notes (Signed)
Carelink summary report received. Battery status OK. Normal device function. No new symptom episodes, tachy episodes, brady, or pause episodes. No new AF episodes. Monthly summary reports and ROV/PRN 

## 2016-10-22 DIAGNOSIS — H6123 Impacted cerumen, bilateral: Secondary | ICD-10-CM | POA: Insufficient documentation

## 2016-10-27 ENCOUNTER — Ambulatory Visit (INDEPENDENT_AMBULATORY_CARE_PROVIDER_SITE_OTHER): Payer: Medicare Other | Admitting: *Deleted

## 2016-10-27 DIAGNOSIS — R002 Palpitations: Secondary | ICD-10-CM

## 2016-10-27 NOTE — Progress Notes (Signed)
Carelink Summary Report / Loop Recorder 

## 2016-11-07 LAB — CUP PACEART REMOTE DEVICE CHECK
Date Time Interrogation Session: 20180516153613
MDC IDC PG IMPLANT DT: 20180116

## 2016-11-07 NOTE — Progress Notes (Signed)
Carelink summary report received. Battery status OK. Normal device function. No new symptom episodes, tachy episodes, brady, or pause episodes. No new AF episodes. Monthly summary reports and ROV/PRN 

## 2016-11-26 ENCOUNTER — Ambulatory Visit (INDEPENDENT_AMBULATORY_CARE_PROVIDER_SITE_OTHER): Payer: Medicare Other | Admitting: *Deleted

## 2016-11-26 DIAGNOSIS — R002 Palpitations: Secondary | ICD-10-CM | POA: Diagnosis not present

## 2016-11-26 NOTE — Progress Notes (Signed)
Carelink Summary Report / Loop Recorder 

## 2016-12-06 LAB — CUP PACEART REMOTE DEVICE CHECK
Date Time Interrogation Session: 20180615160727
Implantable Pulse Generator Implant Date: 20180116

## 2016-12-06 NOTE — Progress Notes (Signed)
Carelink summary report received. Battery status OK. Normal device function. No new symptom episodes, tachy episodes, brady, or pause episodes. No new AF episodes. Monthly summary reports and ROV/PRN 

## 2016-12-27 ENCOUNTER — Ambulatory Visit (INDEPENDENT_AMBULATORY_CARE_PROVIDER_SITE_OTHER): Payer: Medicare Other | Admitting: *Deleted

## 2016-12-27 DIAGNOSIS — R002 Palpitations: Secondary | ICD-10-CM | POA: Diagnosis not present

## 2016-12-27 NOTE — Progress Notes (Signed)
Carelink Summary Report / Loop Recorder 

## 2017-01-06 LAB — CUP PACEART REMOTE DEVICE CHECK
Implantable Pulse Generator Implant Date: 20180116
MDC IDC SESS DTM: 20180715163959

## 2017-01-06 NOTE — Progress Notes (Signed)
Carelink summary report received. Battery status OK. Normal device function. No new symptom episodes, tachy episodes, brady, or pause episodes. No new AF episodes. Monthly summary reports and ROV/PRN 

## 2017-01-25 ENCOUNTER — Ambulatory Visit (INDEPENDENT_AMBULATORY_CARE_PROVIDER_SITE_OTHER): Payer: Medicare Other | Admitting: *Deleted

## 2017-01-25 DIAGNOSIS — G459 Transient cerebral ischemic attack, unspecified: Secondary | ICD-10-CM

## 2017-01-29 LAB — CUP PACEART REMOTE DEVICE CHECK
MDC IDC PG IMPLANT DT: 20180116
MDC IDC SESS DTM: 20180814173827

## 2017-01-29 NOTE — Progress Notes (Signed)
Carelink summary report received. Battery status OK. Normal device function. No new symptom episodes, tachy episodes, brady, or pause episodes. No new AF episodes. Monthly summary reports and ROV/PRN 

## 2017-02-07 NOTE — Progress Notes (Signed)
Loop recorder summary report 

## 2017-02-24 ENCOUNTER — Ambulatory Visit (INDEPENDENT_AMBULATORY_CARE_PROVIDER_SITE_OTHER): Payer: Medicare Other | Admitting: *Deleted

## 2017-02-24 DIAGNOSIS — G459 Transient cerebral ischemic attack, unspecified: Secondary | ICD-10-CM

## 2017-02-24 NOTE — Progress Notes (Signed)
Carelink Summary Report / Loop Recorder 

## 2017-02-28 LAB — CUP PACEART REMOTE DEVICE CHECK
Date Time Interrogation Session: 20180913173854
Implantable Pulse Generator Implant Date: 20180116

## 2017-03-28 ENCOUNTER — Ambulatory Visit (INDEPENDENT_AMBULATORY_CARE_PROVIDER_SITE_OTHER): Payer: Medicare Other | Admitting: *Deleted

## 2017-03-28 DIAGNOSIS — G459 Transient cerebral ischemic attack, unspecified: Secondary | ICD-10-CM | POA: Diagnosis not present

## 2017-03-29 LAB — CUP PACEART REMOTE DEVICE CHECK
Date Time Interrogation Session: 20181013180946
MDC IDC PG IMPLANT DT: 20180116

## 2017-03-29 NOTE — Progress Notes (Signed)
Carelink Summary Report / Loop Recorder 

## 2017-04-25 ENCOUNTER — Ambulatory Visit (INDEPENDENT_AMBULATORY_CARE_PROVIDER_SITE_OTHER): Payer: Medicare Other | Admitting: *Deleted

## 2017-04-25 DIAGNOSIS — R002 Palpitations: Secondary | ICD-10-CM

## 2017-04-26 NOTE — Progress Notes (Signed)
Carelink Summary Report / Loop Recorder 

## 2017-05-02 LAB — CUP PACEART REMOTE DEVICE CHECK
Date Time Interrogation Session: 20181112184056
MDC IDC PG IMPLANT DT: 20180116

## 2017-05-25 ENCOUNTER — Ambulatory Visit (INDEPENDENT_AMBULATORY_CARE_PROVIDER_SITE_OTHER): Payer: Medicare Other | Admitting: *Deleted

## 2017-05-25 DIAGNOSIS — R002 Palpitations: Secondary | ICD-10-CM

## 2017-05-26 NOTE — Progress Notes (Signed)
Carelink Summary Report / Loop Recorder 

## 2017-06-02 LAB — CUP PACEART REMOTE DEVICE CHECK
Date Time Interrogation Session: 20181212184159
MDC IDC PG IMPLANT DT: 20180116

## 2017-06-24 ENCOUNTER — Ambulatory Visit (INDEPENDENT_AMBULATORY_CARE_PROVIDER_SITE_OTHER): Payer: Medicare Other | Admitting: *Deleted

## 2017-06-24 DIAGNOSIS — R002 Palpitations: Secondary | ICD-10-CM | POA: Diagnosis not present

## 2017-06-28 NOTE — Progress Notes (Signed)
Carelink Summary Report / Loop Recorder 

## 2017-07-05 LAB — CUP PACEART REMOTE DEVICE CHECK
Implantable Pulse Generator Implant Date: 20180116
MDC IDC SESS DTM: 20190111191108

## 2017-07-06 ENCOUNTER — Encounter: Payer: Medicare Other | Admitting: Cardiology

## 2017-07-06 ENCOUNTER — Telehealth: Payer: Self-pay | Admitting: Cardiology

## 2017-07-06 NOTE — Telephone Encounter (Signed)
Spoke w/ pt and requested that she send a manual transmission b/c her home monitor has not updated in at least 14 days.   

## 2017-07-19 NOTE — Progress Notes (Signed)
Electrophysiology Office Note   Date:  07/20/2017   ID:  Autumn BridgemanKarlyn S Benassi, DOB Oct 25, 1929, MRN [id number]  PCP:  Geoffry ParadiseAronson, Richard, MD  Primary Electrophysiologist:  Will Jorja LoaMartin Camnitz, MD    Chief Complaint  Patient presents with  . Pacemaker Check    Palpitations     History of Present Illness: Autumn Allen is a 84 y.o. female who presents today for electrophysiology evaluation.   History of hypertension, hyperlipidemia, TIA, obstructive sleep apnea, palpitations. She wore a 30 day monitor that showed no evidence of tachycardia or rhythm abnormality. LINQ monitor implanted 06/29/16.  Today, denies symptoms of palpitations, chest pain, shortness of breath, orthopnea, PND, lower extremity edema, claudication, dizziness, presyncope, syncope, bleeding, or neurologic sequela. The patient is tolerating medications without difficulties.  She has noted no further episodes of long palpitations.  She has 20-30 seconds generally in the mornings.  She otherwise feels well without major complaint.   Past Medical History:  Diagnosis Date  . Allergy   . Aortic stenosis    a. mild to mod by Echo 07/2012  . Arthritis   . Chronic cystitis   . Depression   . Gait disturbance 06/10/2014  . Hx of cardiovascular stress test    a. ETT-Echo 3/12:  EF 60%, normal study  . Hx of echocardiogram    a. Echo 2/14:  Mild LVH, EF 60-65%, Gr 1 diast dysfn, mild to mod AS, mean 17 mmHg, AVA 1.3 (VTI), trivial MR, mild LAE, PASP 31   . Hyperlipemia   . Hypertension   . Hypothyroid   . OSA on CPAP   . Palpitations    a. event monitor 3/14:  NSR, sinus brady  . Plantar fasciitis   . S/P cholecystectomy   . S/P TAH-BSO   . Sleep apnea   . TIA (transient ischemic attack)    Past Surgical History:  Procedure Laterality Date  . ABDOMINAL HYSTERECTOMY    . CHOLECYSTECTOMY    . EP IMPLANTABLE DEVICE N/A 06/29/2016   Procedure: Loop Recorder Insertion;  Surgeon: Will Jorja LoaMartin Camnitz, MD;  [facility];  Service: Cardiovascular;  Laterality: N/A;  . INCONTINENCE SURGERY    . TONSILLECTOMY       Current Outpatient Medications  Medication Sig Dispense Refill  . aspirin 81 MG tablet Take 81 mg by mouth daily.    Marland Kitchen. BIOTIN PO Take 1 capsule by mouth daily.    . Cholecalciferol (VITAMIN D) 2000 units tablet Take 2,000 Units by mouth daily.    Marland Kitchen. escitalopram (LEXAPRO) 10 MG tablet Take 10 mg by mouth at bedtime.    . felodipine (PLENDIL) 5 MG 24 hr tablet Take 5 mg by mouth daily.    . indapamide (LOZOL) 2.5 MG tablet Take 2.5 mg by mouth daily.    Marland Kitchen. levothyroxine (SYNTHROID, LEVOTHROID) 50 MCG tablet Take 50 mcg by mouth every evening.     . methenamine (UREX) 1 G tablet Take 1 g by mouth 2 (two) times daily with a meal.    . rosuvastatin (CRESTOR) 20 MG tablet Take 20 mg by mouth 2 (two) times a week. Once every three days     No current facility-administered medications for this visit.     Allergies:   Metronidazole and related; Ciprofloxacin; Etodolac; Naproxen; Nitrofurantoin; Sulfonamide derivatives; Alprazolam; Epinephrine; and Macrobid [nitrofurantoin macrocrystal]   Social History:  The patient  reports that she has quit smoking. she has never used smokeless tobacco. She reports that  she does not drink alcohol or use drugs.   Family History:  The patient's family history includes Cancer - Prostate in her father; Healthy in her brother and brother; Heart attack (age of onset: 76) in her mother; Heart failure in her mother; Lung disease in her father.   ROS:  Please see the history of present illness.   Otherwise, review of systems is positive for leg pain, palpitations, hearing loss, snoring, wheezing, joint swelling.   All other systems are reviewed and negative.   PHYSICAL EXAM: VS:  BP 138/70   Pulse (!) 58   Ht 5\' 5"  (1.651 m)   Wt 186 lb 3.2 oz (84.5 kg)   BMI 30.99 kg/m  , BMI Body mass index is 30.99 kg/m. GEN: Well nourished, well developed, in no acute  distress  HEENT: normal  Neck: no JVD, carotid bruits, or masses Cardiac: RRR; 2 out of 6 systolic murmur at the base, no rubs, or gallops,no edema  Respiratory:  clear to auscultation bilaterally, normal work of breathing GI: soft, nontender, nondistended, + BS MS: no deformity or atrophy  Skin: warm and dry, device site well healed Neuro:  Strength and sensation are intact Psych: euthymic mood, full affect  EKG:  EKG is ordered today. Personal review of the ekg ordered shows sinus rhythm, rate 58, PR 110 msec  Personal review of the device interrogation today. Results in [facility]   Recent Labs: No results found for requested labs within last 8760 hours.    Lipid Panel     Component Value Date/Time   CHOL 218 (H) 04/19/2015 0615   TRIG 224 (H) 04/19/2015 0615   HDL 40 (L) 04/19/2015 0615   CHOLHDL 5.5 04/19/2015 0615   VLDL 45 (H) 04/19/2015 0615   LDLCALC 133 (H) 04/19/2015 0615   LDLDIRECT 135.1 08/26/2010 0844     Wt Readings from Last 3 Encounters:  07/20/17 186 lb 3.2 oz (84.5 kg)  06/29/16 180 lb (81.6 kg)  06/15/16 184 lb 6.4 oz (83.6 kg)      Other studies Reviewed: Additional studies/ records that were reviewed today include: 11/16 TTE  Review of the above records today demonstrates:  - Left ventricle: The cavity size was normal. Wall thickness was   increased in a pattern of mild LVH. Systolic function was normal.   The estimated ejection fraction was in the range of 55% to 60%.   Doppler parameters are consistent with abnormal left ventricular   relaxation (grade 1 diastolic dysfunction). - Left atrium: The atrium was mildly to moderately dilated. - Right atrium: The atrium was mildly dilated. - Pulmonary arteries: PA peak pressure: 37 mm Hg (S). - Impressions: Very limited echo due to poor sound wave   transmission.  Telemetry 05/12/15 Sinus bradycardia with pac and pvc  Telemetry 05/12/16 Sinus rhythm with episodes of artifact No evidence of  arrhythmia  ASSESSMENT AND PLAN:  1.  Palpitations: 30-day monitor without evidence of arrhythmia.  Had a Linq monitor implanted 06/29/16.  She has had no arrhythmias on her monitor.  It has been in for 1 year.  She wishes to have the monitor explanted.  We will set her up as an outpatient for explant.  2. Hypertension: High normal today.  No changes in management.  3. Hyperlipidemia: Continue Crestor  4. OSA: On CPAP  5.  Murmur: Echo in 2016 without major abnormality.  Murmur appears to be due to aortic stenosis.  Will need follow-up echo in 1 year.  Current  medicines are reviewed at length with the patient today.   The patient does not have concerns regarding her medicines.  The following changes were made today:  none  Labs/ tests ordered today include:  Orders Placed This Encounter  Procedures  . EKG 12-Lead  . ECHOCARDIOGRAM COMPLETE     Disposition:   FU with Will Camnitz 12 months  Signed, Will Jorja Loa, MD  07/20/2017 1:45 PM     [facility] [address] [place] [address] [phone] (office) [phone] (fax)

## 2017-07-20 ENCOUNTER — Encounter: Payer: Self-pay | Admitting: Cardiology

## 2017-07-20 ENCOUNTER — Other Ambulatory Visit: Payer: Self-pay | Admitting: Cardiology

## 2017-07-20 ENCOUNTER — Encounter (INDEPENDENT_AMBULATORY_CARE_PROVIDER_SITE_OTHER): Payer: Self-pay

## 2017-07-20 ENCOUNTER — Ambulatory Visit (INDEPENDENT_AMBULATORY_CARE_PROVIDER_SITE_OTHER): Payer: Medicare Other | Admitting: Cardiology

## 2017-07-20 VITALS — BP 138/70 | HR 58 | Ht 65.0 in | Wt 186.2 lb

## 2017-07-20 DIAGNOSIS — E785 Hyperlipidemia, unspecified: Secondary | ICD-10-CM | POA: Diagnosis not present

## 2017-07-20 DIAGNOSIS — G4733 Obstructive sleep apnea (adult) (pediatric): Secondary | ICD-10-CM | POA: Diagnosis not present

## 2017-07-20 DIAGNOSIS — R002 Palpitations: Secondary | ICD-10-CM

## 2017-07-20 DIAGNOSIS — I10 Essential (primary) hypertension: Secondary | ICD-10-CM

## 2017-07-20 NOTE — Patient Instructions (Addendum)
Medication Instructions:  Your physician recommends that you continue on your current medications as directed. Please refer to the Current Medication list given to you today.  * If you need a refill on your cardiac medications before your next appointment, please call your pharmacy. *  Labwork: None ordered  Testing/Procedures: Your physician has requested that you have an echocardiogram in 1 year - before your follow up with Dr. Elberta Fortisamnitz. Echocardiography is a painless test that uses sound waves to create images of your heart. It provides your doctor with information about the size and shape of your heart and how well your heart's chambers and valves are working. This procedure takes approximately one hour. There are no restrictions for this procedure.  Your physician recommends that you have your loop recorder removed:     You are scheduled for 08/10/17     Arrive to the main entrance - [facility], Entrance "A" at 7:30 a.m.     You may have a light breakfast before coming in.     You may take your morning medications   Follow-Up: Your physician wants you to follow-up in: 1 year with Dr. Elberta Fortisamnitz - after your echocardiogram has been completed.  You will receive a reminder letter in the mail two months in advance. If you don't receive a letter, please call our office to schedule the follow-up appointment.  Thank you for choosing [facility]!!   Dory HornSherri Marten Iles, RN [phone]

## 2017-07-25 ENCOUNTER — Ambulatory Visit (INDEPENDENT_AMBULATORY_CARE_PROVIDER_SITE_OTHER): Payer: Medicare Other | Admitting: *Deleted

## 2017-07-25 DIAGNOSIS — R002 Palpitations: Secondary | ICD-10-CM | POA: Diagnosis not present

## 2017-07-26 NOTE — Progress Notes (Signed)
Carelink Summary Report / Loop Recorder 

## 2017-07-27 ENCOUNTER — Telehealth: Payer: Self-pay | Admitting: Cardiology

## 2017-07-27 NOTE — Telephone Encounter (Signed)
Spoke w/ pt and requested that she send a manual transmission b/c her home monitor has not updated in at least 14 days. Patient is having her loop recorder removed on 08-10-17.

## 2017-08-10 ENCOUNTER — Ambulatory Visit (HOSPITAL_COMMUNITY)
Admission: RE | Admit: 2017-08-10 | Discharge: 2017-08-10 | Disposition: A | Discharge status: Home / Self Care | Payer: Medicare Other | Source: Ambulatory Visit | Attending: Cardiology | Admitting: Cardiology

## 2017-08-10 ENCOUNTER — Encounter (HOSPITAL_COMMUNITY): Payer: Self-pay | Admitting: Cardiology

## 2017-08-10 ENCOUNTER — Encounter (HOSPITAL_COMMUNITY)
Admission: RE | Disposition: A | Discharge status: Home / Self Care | Payer: Self-pay | Source: Ambulatory Visit | Attending: Cardiology

## 2017-08-10 DIAGNOSIS — R011 Cardiac murmur, unspecified: Secondary | ICD-10-CM | POA: Insufficient documentation

## 2017-08-10 DIAGNOSIS — G4733 Obstructive sleep apnea (adult) (pediatric): Secondary | ICD-10-CM | POA: Insufficient documentation

## 2017-08-10 DIAGNOSIS — Z8249 Family history of ischemic heart disease and other diseases of the circulatory system: Secondary | ICD-10-CM | POA: Diagnosis not present

## 2017-08-10 DIAGNOSIS — R002 Palpitations: Secondary | ICD-10-CM | POA: Diagnosis not present

## 2017-08-10 DIAGNOSIS — Z79899 Other long term (current) drug therapy: Secondary | ICD-10-CM | POA: Insufficient documentation

## 2017-08-10 DIAGNOSIS — Z8673 Personal history of transient ischemic attack (TIA), and cerebral infarction without residual deficits: Secondary | ICD-10-CM | POA: Insufficient documentation

## 2017-08-10 DIAGNOSIS — Z7982 Long term (current) use of aspirin: Secondary | ICD-10-CM | POA: Diagnosis not present

## 2017-08-10 DIAGNOSIS — I10 Essential (primary) hypertension: Secondary | ICD-10-CM | POA: Diagnosis not present

## 2017-08-10 DIAGNOSIS — E785 Hyperlipidemia, unspecified: Secondary | ICD-10-CM | POA: Insufficient documentation

## 2017-08-10 DIAGNOSIS — Z9989 Dependence on other enabling machines and devices: Secondary | ICD-10-CM | POA: Diagnosis not present

## 2017-08-10 DIAGNOSIS — Z87891 Personal history of nicotine dependence: Secondary | ICD-10-CM | POA: Insufficient documentation

## 2017-08-10 DIAGNOSIS — F329 Major depressive disorder, single episode, unspecified: Secondary | ICD-10-CM | POA: Insufficient documentation

## 2017-08-10 DIAGNOSIS — Z79890 Hormone replacement therapy (postmenopausal): Secondary | ICD-10-CM | POA: Diagnosis not present

## 2017-08-10 HISTORY — PX: LOOP RECORDER REMOVAL: EP1215

## 2017-08-10 SURGERY — LOOP RECORDER REMOVAL

## 2017-08-10 MED ORDER — LIDOCAINE-EPINEPHRINE 1 %-1:100000 IJ SOLN
INTRAMUSCULAR | Status: AC
  Filled 2017-08-10: qty 1

## 2017-08-10 MED ORDER — LIDOCAINE-EPINEPHRINE 1 %-1:100000 IJ SOLN
INTRAMUSCULAR | Status: DC | PRN
  Administered 2017-08-10: 30 mL

## 2017-08-10 SURGICAL SUPPLY — 1 items: PACK LOOP INSERTION (CUSTOM PROCEDURE TRAY) ×3 IMPLANT

## 2017-08-10 NOTE — Discharge Instructions (Signed)
Call for any signs or symptoms of infection: fever >101, redness, swelling, warmth, oozing or drainage at the site.

## 2017-08-10 NOTE — H&P (Signed)
Autumn BridgemanKarlyn S Boch has presented today for surgery, with the diagnosis of palpitations.  she has a LINQ monitor and thus wishes it to be explanted, the patient has consented to  Procedure(s): LINQ explant as a surgical intervention .  Risks include but not limited to bleeding, infection, among others. The patient's history has been reviewed, patient examined, no change in status, stable for surgery.  I have reviewed the patient's chart and labs.  Questions were answered to the patient's satisfaction.    Saray Capasso Elberta Fortisamnitz, MD 08/10/2017 7:30 AM

## 2017-08-12 ENCOUNTER — Telehealth: Payer: Self-pay | Admitting: Cardiology

## 2017-08-12 NOTE — Telephone Encounter (Signed)
New Message   Patient is calling in reference to post op surgery instructions. Autumn Allen is wanting to be sure that Dr. Elberta Fortisamnitz states that Autumn Allen can not shower for 3 days. Autumn Allen also wants to know when can Autumn Allen swim and when Autumn Allen can remove the bandage/  Please call to discuss. A detailed message can be left.

## 2017-08-12 NOTE — Telephone Encounter (Signed)
Advised pt not to shower for 3 days post procedure. Advised she may remove the bandage, but not the steri-strips.  Informed that they would fall off on their own. Advised she cannot swim until after her wound check appointment. Patient verbalized understanding and agreeable to plan.

## 2017-08-23 ENCOUNTER — Telehealth: Payer: Self-pay | Admitting: *Deleted

## 2017-08-23 LAB — CUP PACEART REMOTE DEVICE CHECK
Date Time Interrogation Session: 20190210193950
MDC IDC PG IMPLANT DT: 20180116

## 2017-08-23 NOTE — Telephone Encounter (Signed)
LMOM (DPR) requesting that patient call back to schedule ILR explant wound check.  LINQ explanted on 08/10/17 per notes in Epic.  Unenrolled from [place], return kit ordered to home address today.

## 2017-08-26 ENCOUNTER — Ambulatory Visit (INDEPENDENT_AMBULATORY_CARE_PROVIDER_SITE_OTHER): Payer: Self-pay | Admitting: *Deleted

## 2017-08-26 DIAGNOSIS — G459 Transient cerebral ischemic attack, unspecified: Secondary | ICD-10-CM

## 2017-08-26 NOTE — Progress Notes (Signed)
Wound check in clinic, s/p ILR explant on 08/10/17. Steri-strips removed. Wound without redness or edema. Incision edges approximated and well healed. Patient educated about wound care. ROV with WC in 07/2018.  Patient reports she already returned her [employer] monitor.  Advised her to disregard return kit and instructions when she receives them in the mail.  Patient verbalizes understanding of instructions.

## 2017-12-28 DIAGNOSIS — H903 Sensorineural hearing loss, bilateral: Secondary | ICD-10-CM | POA: Insufficient documentation

## 2018-07-18 ENCOUNTER — Other Ambulatory Visit (HOSPITAL_COMMUNITY): Payer: Medicare Other

## 2018-08-15 ENCOUNTER — Ambulatory Visit: Payer: Medicare Other | Admitting: Cardiology

## 2018-08-17 ENCOUNTER — Ambulatory Visit (HOSPITAL_COMMUNITY): Payer: Medicare Other | Attending: Cardiovascular Disease

## 2018-08-17 DIAGNOSIS — R002 Palpitations: Secondary | ICD-10-CM | POA: Insufficient documentation

## 2018-08-17 DIAGNOSIS — G4730 Sleep apnea, unspecified: Secondary | ICD-10-CM | POA: Insufficient documentation

## 2018-08-17 DIAGNOSIS — I080 Rheumatic disorders of both mitral and aortic valves: Secondary | ICD-10-CM | POA: Diagnosis not present

## 2018-08-17 DIAGNOSIS — E785 Hyperlipidemia, unspecified: Secondary | ICD-10-CM | POA: Insufficient documentation

## 2018-08-17 DIAGNOSIS — I10 Essential (primary) hypertension: Secondary | ICD-10-CM | POA: Insufficient documentation

## 2018-08-17 DIAGNOSIS — Z8249 Family history of ischemic heart disease and other diseases of the circulatory system: Secondary | ICD-10-CM | POA: Diagnosis not present

## 2018-08-17 DIAGNOSIS — Z87891 Personal history of nicotine dependence: Secondary | ICD-10-CM | POA: Diagnosis not present

## 2018-08-17 DIAGNOSIS — Z8673 Personal history of transient ischemic attack (TIA), and cerebral infarction without residual deficits: Secondary | ICD-10-CM | POA: Diagnosis not present

## 2018-08-21 ENCOUNTER — Encounter: Payer: Self-pay | Admitting: *Deleted

## 2018-08-21 ENCOUNTER — Telehealth: Payer: Self-pay | Admitting: *Deleted

## 2018-08-21 DIAGNOSIS — N390 Urinary tract infection, site not specified: Secondary | ICD-10-CM | POA: Insufficient documentation

## 2018-08-21 DIAGNOSIS — N951 Menopausal and female climacteric states: Secondary | ICD-10-CM | POA: Insufficient documentation

## 2018-08-21 DIAGNOSIS — N952 Postmenopausal atrophic vaginitis: Secondary | ICD-10-CM | POA: Insufficient documentation

## 2018-08-21 NOTE — Telephone Encounter (Signed)
-----   Message from Will Jorja Loa, MD sent at 08/17/2018  2:46 PM EST ----- TTE without major abnormality. No changes.

## 2018-08-21 NOTE — Telephone Encounter (Signed)
lmtcb

## 2018-08-22 ENCOUNTER — Encounter: Payer: Self-pay | Admitting: Cardiology

## 2018-08-22 ENCOUNTER — Ambulatory Visit: Payer: Medicare Other | Admitting: Cardiology

## 2018-08-22 VITALS — BP 120/62 | HR 53 | Ht 65.0 in | Wt 178.0 lb

## 2018-08-22 DIAGNOSIS — R002 Palpitations: Secondary | ICD-10-CM | POA: Diagnosis not present

## 2018-08-22 NOTE — Telephone Encounter (Signed)
Pt seen today in clinic

## 2018-08-22 NOTE — Progress Notes (Signed)
Electrophysiology Office Note   Date:  08/22/2018   ID:  Autumn Allen, DOB [birth year], MRN [id number]  PCP:  Geoffry Paradise, MD  Primary Electrophysiologist:  Lessly Stigler Jorja Loa, MD    No chief complaint on file.    History of Present Illness: Autumn Allen is a 85 y.o. female who presents today for electrophysiology evaluation.   History of hypertension, hyperlipidemia, TIA, obstructive sleep apnea, palpitations. She wore a 30 day monitor that showed no evidence of tachycardia or rhythm abnormality. LINQ monitor implanted 06/29/16 was explanted February 2019.  Today, denies symptoms of palpitations, chest pain, shortness of breath, orthopnea, PND, lower extremity edema, claudication, dizziness, presyncope, syncope, bleeding, or neurologic sequela. The patient is tolerating medications without difficulties.  Overall she is doing well.  She has no chest pain or shortness of breath.  She is able to do all of her daily activities without restriction.  She only has minimal palpitations a few times a month that lasts a few seconds at a time.   Past Medical History:  Diagnosis Date  . Allergy   . Aortic stenosis    a. mild to mod by Echo 07/2012  . Arthritis   . Chronic cystitis   . Depression   . Gait disturbance 06/10/2014  . Hx of cardiovascular stress test    a. ETT-Echo 3/12:  EF 60%, normal study  . Hx of echocardiogram    a. Echo 2/14:  Mild LVH, EF 60-65%, Gr 1 diast dysfn, mild to mod AS, mean 17 mmHg, AVA 1.3 (VTI), trivial MR, mild LAE, PASP 31   . Hyperlipemia   . Hypertension   . Hypothyroid   . OSA on CPAP   . Palpitations    a. event monitor 3/14:  NSR, sinus brady  . Plantar fasciitis   . S/P cholecystectomy   . S/P TAH-BSO   . Sleep apnea   . TIA (transient ischemic attack)    Past Surgical History:  Procedure Laterality Date  . ABDOMINAL HYSTERECTOMY    . CHOLECYSTECTOMY    . EP IMPLANTABLE DEVICE N/A 06/29/2016   Procedure: Loop Recorder  Insertion;  Surgeon: Damari Suastegui Jorja Loa, MD;  Location: [facility] INVASIVE CV LAB;  Service: Cardiovascular;  Laterality: N/A;  . INCONTINENCE SURGERY    . LOOP RECORDER REMOVAL N/A 08/10/2017   Procedure: LOOP RECORDER REMOVAL;  Surgeon: Regan Lemming, MD;  Location: [facility] INVASIVE CV [facility];  Service: Cardiovascular;  Laterality: N/A;  . TONSILLECTOMY       Current Outpatient Medications  Medication Sig Dispense Refill  . aspirin 81 MG tablet Take 81 mg by mouth daily.    Marland Kitchen atorvastatin (LIPITOR) 20 MG tablet Take 20 mg by mouth 3 (three) times a week.    . Biotin 5000 MCG CAPS Take 5,000 mcg by mouth daily.     . cholecalciferol (VITAMIN D) 1000 units tablet Take 1,000 Units by mouth daily.     Marland Kitchen escitalopram (LEXAPRO) 10 MG tablet Take 10 mg by mouth at bedtime.    . felodipine (PLENDIL) 5 MG 24 hr tablet Take 5 mg by mouth daily.    . indapamide (LOZOL) 2.5 MG tablet Take 2.5 mg by mouth daily.    Marland Kitchen levothyroxine (SYNTHROID, LEVOTHROID) 50 MCG tablet Take 50 mcg by mouth every evening.     . methenamine (UREX) 1 G tablet Take 1 g by mouth 2 (two) times daily with a meal.    . metroNIDAZOLE (METROCREAM) 0.75 % cream Apply  1 application topically 2 (two) times daily as needed (for rosacea).     No current facility-administered medications for this visit.     Allergies:   Metronidazole and related; Ciprofloxacin; Etodolac; Naproxen; Nitrofurantoin; Sulfonamide derivatives; Alprazolam; Epinephrine; and Macrobid [nitrofurantoin macrocrystal]   Social History:  The patient  reports that she has quit smoking. She has never used smokeless tobacco. She reports that she does not drink alcohol or use drugs.   Family History:  The patient's family history includes Cancer - Prostate in her father; Healthy in her brother and brother; Heart attack (age of onset: 43) in her mother; Heart failure in her maternal uncle and mother; Lung disease in her father.   ROS:  Please see the history of present  illness.   Otherwise, review of systems is positive for in, hearing loss, diarrhea, balance problems, easy bruising.   All other systems are reviewed and negative.   PHYSICAL EXAM: VS:  BP 120/62   Pulse (!) 53   Ht 5\' 5"  (1.651 m)   Wt 178 lb (80.7 kg)   BMI 29.62 kg/m  , BMI Body mass index is 29.62 kg/m. GEN: Well nourished, well developed, in no acute distress  HEENT: normal  Neck: no JVD, carotid bruits, or masses Cardiac: RRR; 2 out of 6 systolic murmur at the base, no rubs, or gallops,no edema  Respiratory:  clear to auscultation bilaterally, normal work of breathing GI: soft, nontender, nondistended, + BS MS: no deformity or atrophy  Skin: warm and dry Neuro:  Strength and sensation are intact Psych: euthymic mood, full affect  EKG:  EKG is ordered today. Personal review of the ekg ordered shows sinus rhythm with PACs  Recent Labs: No results found for requested labs within last 8760 hours.    Lipid Panel     Component Value Date/Time   CHOL 218 (H) 04/19/2015 0615   TRIG 224 (H) 04/19/2015 0615   HDL 40 (L) 04/19/2015 0615   CHOLHDL 5.5 04/19/2015 0615   VLDL 45 (H) 04/19/2015 0615   LDLCALC 133 (H) 04/19/2015 0615   LDLDIRECT 135.1 08/26/2010 0844     Wt Readings from Last 3 Encounters:  08/22/18 178 lb (80.7 kg)  08/10/17 180 lb (81.6 kg)  07/20/17 186 lb 3.2 oz (84.5 kg)      Other studies Reviewed: Additional studies/ records that were reviewed today include: 08/17/2018 Review of the above records today demonstrates:   1. The left ventricle has normal systolic function with an ejection fraction of 60-65%. The cavity size was normal. There is mildly increased left ventricular wall thickness. Left ventricular diastolic Doppler parameters are consistent with impaired  relaxation.  2. The right ventricle has normal systolic function. The cavity was normal. There is no increase in right ventricular wall thickness.  3. The mitral valve is degenerative. Mild  thickening of the mitral valve leaflet. Mild calcification of the mitral valve leaflet. There is mild mitral annular calcification present.  4. The tricuspid valve is normal in structure.  5. The aortic valve is tricuspid Moderate thickening of the aortic valve Moderate calcification of the aortic valve.  6. The pulmonic valve was normal in structure.  Telemetry 05/12/15 Sinus bradycardia with pac and pvc  Telemetry 05/12/16 Sinus rhythm with episodes of artifact No evidence of arrhythmia  ASSESSMENT AND PLAN:  1.  Palpitations: 30-day monitor and Linq monitor without any evidence of major arrhythmia.  Linq monitor has since been implanted in 2019.  Only minimal palpitations.  2. Hypertension: Currently well controlled  3. Hyperlipidemia: Continue Crestor  4. OSA: CPAP compliance encouraged  5.  Murmur: Could be due to mitral valve thickening or aortic sclerosis.  Onalee Steinbach likely need a repeat echo in a few years.  Current medicines are reviewed at length with the patient today.   The patient does not have concerns regarding her medicines.  The following changes were made today: None  Labs/ tests ordered today include:  Orders Placed This Encounter  Procedures  . EKG 12-Lead     Disposition:   FU with Chasitty Hehl 12 months  Signed, Richy Spradley Jorja Loa, MD  08/22/2018 10:27 AM     [facility] [address] [place] [address] [phone] (office) [phone] (fax)

## 2018-08-22 NOTE — Patient Instructions (Signed)
Medication Instructions:  Your physician recommends that you continue on your current medications as directed. Please refer to the Current Medication list given to you today.  * If you need a refill on your cardiac medications before your next appointment, please call your pharmacy.   Labwork: None ordered  Testing/Procedures: None ordered  Follow-Up: Your physician wants you to follow-up in: 1 year with Dr. Camnitz.  You will receive a reminder letter in the mail two months in advance. If you don't receive a letter, please call our office to schedule the follow-up appointment.  Thank you for choosing [facility]!!   Birdie Fetty, RN [phone]        

## 2019-05-07 ENCOUNTER — Institutional Professional Consult (permissible substitution): Payer: Medicare Other | Admitting: Neurology

## 2019-05-16 ENCOUNTER — Other Ambulatory Visit: Payer: Self-pay

## 2019-05-16 ENCOUNTER — Ambulatory Visit: Payer: Medicare Other | Admitting: Neurology

## 2019-05-16 ENCOUNTER — Encounter: Payer: Self-pay | Admitting: Neurology

## 2019-05-16 VITALS — BP 143/76 | HR 54 | Temp 97.5°F | Ht 65.0 in | Wt 188.0 lb

## 2019-05-16 DIAGNOSIS — I10 Essential (primary) hypertension: Secondary | ICD-10-CM

## 2019-05-16 DIAGNOSIS — G4733 Obstructive sleep apnea (adult) (pediatric): Secondary | ICD-10-CM

## 2019-05-16 DIAGNOSIS — F4321 Adjustment disorder with depressed mood: Secondary | ICD-10-CM | POA: Diagnosis not present

## 2019-05-16 DIAGNOSIS — Z634 Disappearance and death of family member: Secondary | ICD-10-CM

## 2019-05-16 DIAGNOSIS — G459 Transient cerebral ischemic attack, unspecified: Secondary | ICD-10-CM

## 2019-05-16 DIAGNOSIS — F5102 Adjustment insomnia: Secondary | ICD-10-CM | POA: Insufficient documentation

## 2019-05-16 DIAGNOSIS — H903 Sensorineural hearing loss, bilateral: Secondary | ICD-10-CM

## 2019-05-16 MED ORDER — TRAZODONE HCL 50 MG PO TABS: ORAL_TABLET | ORAL | 1 refills | Status: DC

## 2019-05-16 MED ORDER — MELATONIN 5 MG PO CAPS: 5.0000 mg | ORAL_CAPSULE | Freq: Every day | ORAL | 0 refills | Status: DC

## 2019-05-16 NOTE — Progress Notes (Signed)
SLEEP MEDICINE CLINIC    Provider:  Melvyn Novasarmen  Laporchia Nakajima, MD  Primary Neurologist : Lesia SagoKeith Willis, MD   Referring Provider: Geoffry ParadiseAronson, Richard, Md [address] [place] [id number]          Chief Complaint according to patient   Patient presents with:    . New Patient (Initial Visit)     Paper referral from Dr. Jacky KindleAronson for OSA. Patient of Dr Wilfred LacywWillis 2015- 2018. Had prior sleep study in 2008 with Dr. Vickey Hugerohmeier. Attached to referral.       HISTORY OF PRESENT ILLNESS:  Autumn Allen is a 85 y.o. year old Caucasian female patient seen here in a re- referral on 05/16/2019, this time  from Dr Jacky KindleAronson.  Chief concern according to patient :  I have been going to [facility] for my CPAP machine , the current one is 83 years old and needs replacement.  She also reports insomnia, since her son passed away November  2020, 3 weeks ago, just before Thanksgiving. He died of cancer. He was a [employer] pilot. She is in acute grief.     Autumn Allen  Is  a right -handed White or Caucasian female with a medical history of Allergy, Aortic stenosis, Arthritis, Chronic cystitis, Depression, Gait disturbance (06/10/2014), cardiovascular stress test, echocardiogram, Hyperlipemia, Hypertension, Hypothyroid, OSA on CPAP since 2008, Palpitations, Plantar fasciitis, S/P cholecystectomy, S/P TAH-BSO, Sleep apnea, and TIA ( Dr Anne HahnWillis, transient ischemic attack) now presenting with insomnia related to acute grief.    The patient had the first sleep study in the year 2008 with a result of an AHI ( Apnea Hypopnea index)  of 35.5 ,and thunderous snoring- and  an oxygen saturation Nadir at SP02 75%. She was titrated from 4 cm to 7 cm water at the time.     Sleep relevant medical history: No Nocturia on CPAP ,but  incontinence at night.   Family medical /sleep history: No other family member on CPAP with OSA, insomnia, no sleep walkers.    Social history:  Patient is retired from Agricultural consultantteaching, elementary school - and  volunteers at the [employer].  she lives in  [place] community of 200 members. [facility].  Family status is widowed, she just a son 04-2019, with 3 children, one living. Pets are not present. Tobacco use - quit over 35 years ago.  ETOH use ; wine , 3 a week-, Caffeine intake in form of Coffee( decaff) Soda(none) Tea ( none) nor energy drinks. Regular exercise in form of walk. Hobbies : reading, classic music.      Sleep habits are as follows: The patient's dinner time is between 6.30 PM. The patient goes to bed at midnight  PM and continues to sleep for 6-8 hours, she doesn't wake for bathroom breaks. This has acutely changed with the death of her son, Autumn RenShankland jr.     The preferred sleep position is lateral, with the support of 1 pillow. Dreams are reportedlyfrequent/vivid .  8-9 AM is the usual rise time.  The patient wakes up spontaneously.  She reports not feeling refreshed or restored in AM, with symptoms such as dry mouth, some morning headaches.  Review of Systems: Out of a complete 14 system review, the patient complains of only the following symptoms, and all other reviewed systems are negative.:  Fatigue, sleepiness , snoring when not on CPAP , fragmented sleep,   nsomnia due to grief !!!   How likely  are you to doze in the following situations: 0 = not likely, 1 = slight chance, 2 = moderate chance, 3 = high chance   Sitting and Reading? Watching Television? Sitting inactive in a public place (theater or meeting)? As a passenger in a car for an hour without a break? Lying down in the afternoon when circumstances permit? Sitting and talking to someone? Sitting quietly after lunch without alcohol? In a car, while stopped for a few minutes in traffic?   Total = 7/ 24 points   FSS endorsed at 19/ 63 points.   Social History   Socioeconomic History  . Marital status: Widowed    Spouse name: Not on file  . Number of children: 2  . Years of  education: college gr  . Highest education level: Not on file  Occupational History  . Occupation: retired Tour manager  . Financial resource strain: Not on file  . Food insecurity    Worry: Not on file    Inability: Not on file  . Transportation needs    Medical: Not on file    Non-medical: Not on file  Tobacco Use  . Smoking status: Former Research scientist (life sciences)  . Smokeless tobacco: Never Used  Substance and Sexual Activity  . Alcohol use: Yes    Comment: wine sometimes  . Drug use: No  . Sexual activity: Not on file  Lifestyle  . Physical activity    Days per week: Not on file    Minutes per session: Not on file  . Stress: Not on file  Relationships  . Social Herbalist on phone: Not on file    Gets together: Not on file    Attends religious service: Not on file    Active member of club or organization: Not on file    Attends meetings of clubs or organizations: Not on file    Relationship status: Not on file  Other Topics Concern  . Not on file  Social History Narrative   Patient is right handed.    Patient drinks about 2-3 cups of caffeine daily- decaf coffee.   Patient lives alone at [facility] independent living.    Family History  Problem Relation Age of Onset  . Heart attack Mother 88  . Heart failure Mother   . Lung disease Father   . Cancer - Prostate Father   . Healthy Brother   . Healthy Brother   . Heart failure Maternal Uncle     Past Medical History:  Diagnosis Date  . Allergy   . Aortic stenosis    a. mild to mod by Echo 07/2012  . Arthritis   . Chronic cystitis   . Depression   . Gait disturbance 06/10/2014  . Hx of cardiovascular stress test    a. ETT-Echo 3/12:  EF 60%, normal study  . Hx of echocardiogram    a. Echo 2/14:  Mild LVH, EF 60-65%, Gr 1 diast dysfn, mild to mod AS, mean 17 mmHg, AVA 1.3 (VTI), trivial MR, mild LAE, PASP 31   . Hyperlipemia   . Hypertension   . Hypothyroid   . OSA on CPAP   . Palpitations     a. event monitor 3/14:  NSR, sinus brady  . Plantar fasciitis   . S/P cholecystectomy   . S/P TAH-BSO   . Sleep apnea   . TIA (transient ischemic attack)     Past Surgical History:  Procedure Laterality Date  .  ABDOMINAL HYSTERECTOMY    . CHOLECYSTECTOMY    . EP IMPLANTABLE DEVICE N/A 06/29/2016   Procedure: Loop Recorder Insertion;  Surgeon: Will Jorja Loa, MD;  Location: [facility]ASIVE [facility];  Service: Cardiovascular;  Laterality: N/A;  . INCONTINENCE SURGERY    . LOOP RECORDER REMOVAL N/A 08/10/2017   Procedure: LOOP RECORDER REMOVAL;  Surgeon: Regan Lemming, MD;  Location: [facility] INVASIVE CV [facility];  Service: Cardiovascular;  Laterality: N/A;  . TONSILLECTOMY       Current Outpatient Medications on File Prior to Visit  Medication Sig Dispense Refill  . aspirin 81 MG tablet Take 81 mg by mouth daily.    Marland Kitchen atorvastatin (LIPITOR) 20 MG tablet Take 20 mg by mouth 3 (three) times a week.    . Biotin 5000 MCG CAPS Take 5,000 mcg by mouth daily.     . cholecalciferol (VITAMIN D) 1000 units tablet Take 1,000 Units by mouth daily.     Marland Kitchen escitalopram (LEXAPRO) 10 MG tablet Take 10 mg by mouth at bedtime.    . felodipine (PLENDIL) 5 MG 24 hr tablet Take 5 mg by mouth daily.    . indapamide (LOZOL) 2.5 MG tablet Take 2.5 mg by mouth daily.    Marland Kitchen levothyroxine (SYNTHROID, LEVOTHROID) 50 MCG tablet Take 50 mcg by mouth every evening.     . methenamine (UREX) 1 G tablet Take 1 g by mouth 2 (two) times daily with a meal.    . metroNIDAZOLE (METROCREAM) 0.75 % cream Apply 1 application topically 2 (two) times daily as needed (for rosacea).     No current facility-administered medications on file prior to visit.     Allergies  Allergen Reactions  . Metronidazole And Related Other (See Comments)    unknown  . Ciprofloxacin Other (See Comments)    Reaction unknown  . Etodolac Other (See Comments)    Reaction unknown  . Naproxen Other (See Comments)    Reaction unknown  .  Nitrofurantoin Other (See Comments)    Reaction unknown  . Sulfonamide Derivatives Other (See Comments)    Reaction unknown  . Alprazolam Rash  . Epinephrine Palpitations  . Macrobid [Nitrofurantoin Macrocrystal] Rash    Physical exam:  Today's Vitals   05/16/19 1448  BP: (!) 143/76  Pulse: (!) 54  Temp: (!) 97.5 F (36.4 C)  SpO2: 95%  Weight: 188 lb (85.3 kg)  Height: 5\' 5"  (1.651 m)   Body mass index is 31.28 kg/m.   Wt Readings from Last 3 Encounters:  05/16/19 188 lb (85.3 kg)  08/22/18 178 lb (80.7 kg)  08/10/17 180 lb (81.6 kg)     Ht Readings from Last 3 Encounters:  05/16/19 5\' 5"  (1.651 m)  08/22/18 5\' 5"  (1.651 m)  08/10/17 5\' 5"  (1.651 m)      General: The patient is awake, alert and appears not in acute distress. The patient is well groomed. Head: Normocephalic, atraumatic. Neck is supple. Mallampati 3 ,  neck circumference:16 inches .  Nasal airflow patent.  Retrognathia is not  seen.  Dental status:  Cardiovascular:  Regular rate and cardiac rhythm by pulse,  without distended neck veins. Respiratory: Lungs are clear to auscultation.  Skin:  Without evidence of ankle edema, or rash. Trunk: The patient's posture is erect.   Neurologic exam : The patient is awake and alert, oriented to place and time.   Memory subjective described as intact.  Attention span & concentration ability appears normal.  Speech is fluent,  without  dysarthria, dysphonia or aphasia.  Mood and affect are appropriate.   Cranial nerves: no loss of smell or taste reported  Pupils are equal and briskly reactive to light. Funduscopic exam deferred.  Extraocular movements in vertical and horizontal planes were intact and without nystagmus. No Diplopia. Visual fields by finger perimetry are intact. Hearing was intact to soft voice and finger rubbing.    Facial sensation intact to fine touch.  Facial motor strength is symmetric and tongue and uvula move midline.  Neck ROM :  rotation, tilt and flexion extension were restricted  for age and shoulder shrug was symmetrical.    Motor exam:  Symmetric bulk, tone and ROM.   Normal tone without cog wheeling, symmetric grip strength .   Sensory:  Fine touch, pinprick and vibration were tested  and  normal.  Proprioception tested in the upper extremities was normal.   Coordination: Rapid alternating movements in the fingers/hands were of normal speed.  The Finger-to-nose maneuver was intact without evidence of ataxia, dysmetria or tremor.   Gait and station: Patient could rise unassisted from a seated position, walked without assistive device.  Stance is of normal width/ base and the patient turned with 4 steps.  Toe and heel walk were deferred.  Deep tendon reflexes: in the  upper and lower extremities are symmetric and intact.  Babinski response was deferred .        After spending a total time of  35 minutes face to face and additional time for physical and neurologic examination, review of laboratory studies,  personal review of imaging studies, reports and results of other testing and review of referral information / records as far as provided in visit, I have established the following assessments:  1) acute grief related Insomnia.  2) OSA with need for a new CPAP machine, repeat sleep study ordered.     My Plan is to proceed with:  1) repeat sleep test , attended sleep study- please order COVID TEST and perform SPLIT if  indicated.  2) Trazodone for a sleep aid.  3) melatonin to continue at 5 mg or less.   I would like to thank Geoffry Paradise, MD and Geoffry Paradise, Md [address] [place],  [place] [address] for allowing me to meet with and to take care of this pleasant patient.   In short, Autumn Allen is presenting with grief related new onset insomnia  While using CPAP machine that is in need of being replaced.  I plan to follow up either personally or through our NP within 3 month.   CC:  I will share my notes with Dr Anne Hahn, Dr Tawni Levy and Dr. Loman Brooklyn   Electronically signed by: Melvyn Novas, MD 05/16/2019 3:21 PM  [facility] and [facility] Board certified by The [employer] of Sleep Medicine and Diplomate of the [employer] of Sleep Medicine. Board certified In Neurology through the ABPN, Fellow of the [employer] of Neurology. Medical Director of [facility].

## 2019-05-16 NOTE — Patient Instructions (Signed)
Trazodone tablets What is this medicine? TRAZODONE (TRAZ oh done) is used to treat depression. This medicine may be used for other purposes; ask your health care provider or pharmacist if you have questions. COMMON BRAND NAME(S): Desyrel What should I tell my health care provider before I take this medicine? They need to know if you have any of these conditions:    Please do not take lexapro/ citalopram at bedtime when taking this sleep aid.   attempted suicide or thinking about it  bipolar disorder  bleeding problems  glaucoma  heart disease, or previous heart attack  irregular heart beat  kidney or liver disease  low levels of sodium in the blood  an unusual or allergic reaction to trazodone, other medicines, foods, dyes or preservatives  pregnant or trying to get pregnant  breast-feeding How should I use this medicine? Take this medicine by mouth with a glass of water. Follow the directions on the prescription label. Take this medicine shortly after a meal or a light snack. Take your medicine at regular intervals. Do not take your medicine more often than directed. Do not stop taking this medicine suddenly except upon the advice of your doctor. Stopping this medicine too quickly may cause serious side effects or your condition may worsen. A special MedGuide will be given to you by the pharmacist with each prescription and refill. Be sure to read this information carefully each time. Talk to your pediatrician regarding the use of this medicine in children. Special care may be needed. Overdosage: If you think you have taken too much of this medicine contact a poison control center or emergency room at once. NOTE: This medicine is only for you. Do not share this medicine with others. What if I miss a dose? If you miss a dose, take it as soon as you can. If it is almost time for your next dose, take only that dose. Do not take double or extra doses. What may interact with this  medicine? Do not take this medicine with any of the following medications:  certain medicines for fungal infections like fluconazole, itraconazole, ketoconazole, posaconazole, voriconazole  cisapride  dronedarone  linezolid  MAOIs like Carbex, Eldepryl, Marplan, Nardil, and Parnate  mesoridazine  methylene blue (injected into a vein)  pimozide  saquinavir  thioridazine This medicine may also interact with the following medications:  alcohol  antiviral medicines for HIV or AIDS  aspirin and aspirin-like medicines  barbiturates like phenobarbital  certain medicines for blood pressure, heart disease, irregular heart beat  certain medicines for depression, anxiety, or psychotic disturbances  certain medicines for migraine headache like almotriptan, eletriptan, frovatriptan, naratriptan, rizatriptan, sumatriptan, zolmitriptan  certain medicines for seizures like carbamazepine and phenytoin  certain medicines for sleep  certain medicines that treat or prevent blood clots like dalteparin, enoxaparin, warfarin  digoxin  fentanyl  lithium  NSAIDS, medicines for pain and inflammation, like ibuprofen or naproxen  other medicines that prolong the QT interval (cause an abnormal heart rhythm) like dofetilide  rasagiline  supplements like St. John's wort, kava kava, valerian  tramadol  tryptophan This list may not describe all possible interactions. Give your health care provider a list of all the medicines, herbs, non-prescription drugs, or dietary supplements you use. Also tell them if you smoke, drink alcohol, or use illegal drugs. Some items may interact with your medicine. What should I watch for while using this medicine? Tell your doctor if your symptoms do not get better or if they get worse.  Visit your doctor or health care professional for regular checks on your progress. Because it may take several weeks to see the full effects of this medicine, it is  important to continue your treatment as prescribed by your doctor. Patients and their families should watch out for new or worsening thoughts of suicide or depression. Also watch out for sudden changes in feelings such as feeling anxious, agitated, panicky, irritable, hostile, aggressive, impulsive, severely restless, overly excited and hyperactive, or not being able to sleep. If this happens, especially at the beginning of treatment or after a change in dose, call your health care professional. Dennis Bast may get drowsy or dizzy. Do not drive, use machinery, or do anything that needs mental alertness until you know how this medicine affects you. Do not stand or sit up quickly, especially if you are an older patient. This reduces the risk of dizzy or fainting spells. Alcohol may interfere with the effect of this medicine. Avoid alcoholic drinks. This medicine may cause dry eyes and blurred vision. If you wear contact lenses you may feel some discomfort. Lubricating drops may help. See your eye doctor if the problem does not go away or is severe. Your mouth may get dry. Chewing sugarless gum, sucking hard candy and drinking plenty of water may help. Contact your doctor if the problem does not go away or is severe. What side effects may I notice from receiving this medicine? Side effects that you should report to your doctor or health care professional as soon as possible:  allergic reactions like skin rash, itching or hives, swelling of the face, lips, or tongue  elevated mood, decreased need for sleep, racing thoughts, impulsive behavior  confusion  fast, irregular heartbeat  feeling faint or lightheaded, falls  feeling agitated, angry, or irritable  loss of balance or coordination  painful or prolonged erections  restlessness, pacing, inability to keep still  suicidal thoughts or other mood changes  tremors  trouble sleeping  seizures  unusual bleeding or bruising Side effects that  usually do not require medical attention (report to your doctor or health care professional if they continue or are bothersome):  change in sex drive or performance  change in appetite or weight  constipation  headache  muscle aches or pains  nausea This list may not describe all possible side effects. Call your doctor for medical advice about side effects. You may report side effects to FDA at 1-800-FDA-1088. Where should I keep my medicine? Keep out of the reach of children. Store at room temperature between 15 and 30 degrees C (59 to 86 degrees F). Protect from light. Keep container tightly closed. Throw away any unused medicine after the expiration date. NOTE: This sheet is a summary. It may not cover all possible information. If you have questions about this medicine, talk to your doctor, pharmacist, or health care provider.  2020 Elsevier/Gold Standard (2018-05-23 11:46:46)

## 2019-05-22 ENCOUNTER — Telehealth: Payer: Self-pay

## 2019-05-22 NOTE — Telephone Encounter (Signed)
FYI- Scheduled sleep study for first available appt, 06/19/2019. Dr. Brett Fairy ordered a Split study with Covid testing. Pt is not interested in doing a Covid test at this time. Pt is okay with having a diagnostic study instead and not wearing her CPAP for that one night. Will need to order an Auto CPAP for pt after sleep study.

## 2019-06-18 ENCOUNTER — Emergency Department (HOSPITAL_COMMUNITY)
Admission: EM | Admit: 2019-06-18 | Discharge: 2019-06-19 | Discharge status: Against Medical Advice | Payer: Medicare PPO | Attending: Emergency Medicine | Admitting: Emergency Medicine

## 2019-06-18 ENCOUNTER — Encounter (HOSPITAL_COMMUNITY): Payer: Self-pay | Admitting: *Deleted

## 2019-06-18 ENCOUNTER — Emergency Department (HOSPITAL_COMMUNITY): Payer: Medicare PPO

## 2019-06-18 DIAGNOSIS — S8002XA Contusion of left knee, initial encounter: Secondary | ICD-10-CM | POA: Diagnosis not present

## 2019-06-18 DIAGNOSIS — Y999 Unspecified external cause status: Secondary | ICD-10-CM | POA: Insufficient documentation

## 2019-06-18 DIAGNOSIS — Z5320 Procedure and treatment not carried out because of patient's decision for unspecified reasons: Secondary | ICD-10-CM | POA: Diagnosis not present

## 2019-06-18 DIAGNOSIS — Y92481 Parking lot as the place of occurrence of the external cause: Secondary | ICD-10-CM | POA: Diagnosis not present

## 2019-06-18 DIAGNOSIS — S0231XA Fracture of orbital floor, right side, initial encounter for closed fracture: Secondary | ICD-10-CM | POA: Insufficient documentation

## 2019-06-18 DIAGNOSIS — R110 Nausea: Secondary | ICD-10-CM | POA: Diagnosis not present

## 2019-06-18 DIAGNOSIS — Y9301 Activity, walking, marching and hiking: Secondary | ICD-10-CM | POA: Diagnosis not present

## 2019-06-18 DIAGNOSIS — R42 Dizziness and giddiness: Secondary | ICD-10-CM | POA: Diagnosis present

## 2019-06-18 DIAGNOSIS — W1830XA Fall on same level, unspecified, initial encounter: Secondary | ICD-10-CM | POA: Diagnosis not present

## 2019-06-18 HISTORY — DX: Cerebral infarction, unspecified: I63.9

## 2019-06-18 HISTORY — DX: Unspecified cataract: H26.9

## 2019-06-18 LAB — CBC
HCT: 47.6 % — ABNORMAL HIGH (ref 36.0–46.0)
Hemoglobin: 15.9 g/dL — ABNORMAL HIGH (ref 12.0–15.0)
MCH: 34.3 pg — ABNORMAL HIGH (ref 26.0–34.0)
MCHC: 33.4 g/dL (ref 30.0–36.0)
MCV: 102.8 fL — ABNORMAL HIGH (ref 80.0–100.0)
Platelets: 250 10*3/uL (ref 150–400)
RBC: 4.63 MIL/uL (ref 3.87–5.11)
RDW: 12.1 % (ref 11.5–15.5)
WBC: 14.4 10*3/uL — ABNORMAL HIGH (ref 4.0–10.5)
nRBC: 0 % (ref 0.0–0.2)

## 2019-06-18 LAB — BASIC METABOLIC PANEL
Anion gap: 10 (ref 5–15)
BUN: 21 mg/dL (ref 8–23)
CO2: 28 mmol/L (ref 22–32)
Calcium: 8.9 mg/dL (ref 8.9–10.3)
Chloride: 100 mmol/L (ref 98–111)
Creatinine, Ser: 0.9 mg/dL (ref 0.44–1.00)
GFR calc Af Amer: >60 mL/min (ref >60)
GFR calc non Af Amer: 57 mL/min — ABNORMAL LOW (ref >60)
Glucose, Bld: 164 mg/dL — ABNORMAL HIGH (ref 70–99)
Potassium: 3.9 mmol/L (ref 3.5–5.1)
Sodium: 138 mmol/L (ref 135–145)

## 2019-06-18 MED ORDER — SODIUM CHLORIDE 0.9% FLUSH: 3.0000 mL | Freq: Once | INTRAVENOUS | Status: DC

## 2019-06-18 NOTE — ED Triage Notes (Addendum)
Pt was at [employer] at 3pm and tripped on cement block in the parking lot and landed on the R side of her face.  At 1730 pt began feeling dizzy and nauseated and called the facility nurse at the [facility].  [facility] felt pupils were equal and reactive, though it's difficult to tell in the R eye swelling.  154/90, hr 50, no neuro deficits. Presently denies nausea.  Pt had her Covid Vaccine at 12 today.  HR 50.

## 2019-06-19 ENCOUNTER — Other Ambulatory Visit: Payer: Self-pay

## 2019-06-19 ENCOUNTER — Emergency Department (HOSPITAL_COMMUNITY): Payer: Medicare PPO

## 2019-06-19 ENCOUNTER — Emergency Department (HOSPITAL_COMMUNITY)
Admission: EM | Admit: 2019-06-19 | Discharge: 2019-06-19 | Disposition: A | Discharge status: Home / Self Care | Payer: Medicare PPO | Source: Home / Self Care | Attending: Emergency Medicine | Admitting: Emergency Medicine

## 2019-06-19 ENCOUNTER — Telehealth: Payer: Self-pay | Admitting: Neurology

## 2019-06-19 DIAGNOSIS — S8002XA Contusion of left knee, initial encounter: Secondary | ICD-10-CM | POA: Insufficient documentation

## 2019-06-19 DIAGNOSIS — Z7982 Long term (current) use of aspirin: Secondary | ICD-10-CM | POA: Insufficient documentation

## 2019-06-19 DIAGNOSIS — Y999 Unspecified external cause status: Secondary | ICD-10-CM | POA: Insufficient documentation

## 2019-06-19 DIAGNOSIS — Z87891 Personal history of nicotine dependence: Secondary | ICD-10-CM | POA: Insufficient documentation

## 2019-06-19 DIAGNOSIS — S0231XA Fracture of orbital floor, right side, initial encounter for closed fracture: Secondary | ICD-10-CM

## 2019-06-19 DIAGNOSIS — Y939 Activity, unspecified: Secondary | ICD-10-CM | POA: Insufficient documentation

## 2019-06-19 DIAGNOSIS — I10 Essential (primary) hypertension: Secondary | ICD-10-CM | POA: Insufficient documentation

## 2019-06-19 DIAGNOSIS — Y929 Unspecified place or not applicable: Secondary | ICD-10-CM | POA: Insufficient documentation

## 2019-06-19 DIAGNOSIS — Z8673 Personal history of transient ischemic attack (TIA), and cerebral infarction without residual deficits: Secondary | ICD-10-CM | POA: Insufficient documentation

## 2019-06-19 DIAGNOSIS — W010XXA Fall on same level from slipping, tripping and stumbling without subsequent striking against object, initial encounter: Secondary | ICD-10-CM | POA: Insufficient documentation

## 2019-06-19 DIAGNOSIS — Z79899 Other long term (current) drug therapy: Secondary | ICD-10-CM | POA: Insufficient documentation

## 2019-06-19 NOTE — ED Triage Notes (Addendum)
Pt presents from home for "skull fracture", fell yesterday and came to ED but left AMA prior to receiving results of head CT. Her neurologist called today and told her she needed to be seen in ED for skull fracture. Yesterday, pt did not lose consciousness, is not on blood thinners. GCS15, PERRLA.   Pt endorses episodes of double vision, facial swelling and pain. Experience N/V yesterday.  VS 128/58, P58, RR18, 99%RA

## 2019-06-19 NOTE — ED Notes (Signed)
Patient transported to X-ray 

## 2019-06-19 NOTE — ED Notes (Signed)
Pt daughter has picked up pt.

## 2019-06-19 NOTE — ED Provider Notes (Signed)
[facility] EMERGENCY DEPARTMENT Provider Note   CSN: [id number] Arrival date & time: 06/19/19  1959     History Chief Complaint  Patient presents with   Autumn Allen is a 86 y.o. female.  HPI    Patient had an accidental trip and fall yesterday.  She was walking at [employer] when she tripped on a smart block and fell striking the right side of her face.  Patient felt dizzy and nauseated after that.  She had an episode of vomiting later in the evening.  Patient also happened to have her Covid vaccine yesterday.  Patient had a head CT and C-spine CT prior to evaluation.  Patient had a very extended wait time and left before being evaluated by her provider.  Patient's daughter called her neurologist today to review the CT scan findings.  They were unable to see her but they recommended that she come to the ED because the CT scan noted a skull fracture.  Patient denies any nausea vomiting today.  She has had some diarrhea.  She denies any change in her vision.  No focal numbness.  No severe headache.  Past Medical History:  Diagnosis Date   Allergy    Aortic stenosis    a. mild to mod by Echo 07/2012   Arthritis    Cataracts, bilateral    Chronic cystitis    Depression    Gait disturbance 06/10/2014   Hx of cardiovascular stress test    a. ETT-Echo 3/12:  EF 60%, normal study   Hx of echocardiogram    a. Echo 2/14:  Mild LVH, EF 60-65%, Gr 1 diast dysfn, mild to mod AS, mean 17 mmHg, AVA 1.3 (VTI), trivial MR, mild LAE, PASP 31    Hyperlipemia    Hypertension    Hypothyroid    OSA on CPAP    Palpitations    a. event monitor 3/14:  NSR, sinus brady   Plantar fasciitis    S/P cholecystectomy    S/P TAH-BSO    Sleep apnea    Stroke [facility])    tia 2016   TIA (transient ischemic attack)     Patient Active Problem List   Diagnosis Date Noted   Grief at loss of child 05/16/2019   Adjustment insomnia 05/16/2019    Atrophic vaginitis 08/21/2018   Menopausal syndrome 08/21/2018   Urinary tract infectious disease 08/21/2018   Sensorineural hearing loss (SNHL), bilateral 12/28/2017   Bilateral impacted cerumen 10/22/2016   Hypothyroidism 04/20/2015   Depression 04/20/2015   TIA (transient ischemic attack)    Essential hypertension    Hyperlipidemia    OSA (obstructive sleep apnea)    Gait disturbance 06/10/2014   MURMUR 08/14/2010   CHEST PAIN 08/14/2010    Past Surgical History:  Procedure Laterality Date   ABDOMINAL HYSTERECTOMY     CHOLECYSTECTOMY     EP IMPLANTABLE DEVICE N/A 06/29/2016   Procedure: Loop Recorder Insertion;  Surgeon: Will Meredith Leeds, MD;  Location: [place] [facility];  Service: Cardiovascular;  Laterality: N/A;   INCONTINENCE SURGERY     LOOP RECORDER REMOVAL N/A 08/10/2017   Procedure: LOOP RECORDER REMOVAL;  Surgeon: Constance Haw, MD;  Location: [place] [facility];  Service: Cardiovascular;  Laterality: N/A;   TONSILLECTOMY       OB History   No obstetric history on file.     Family History  Problem Relation Age of Onset   Heart attack Mother  80   Heart failure Mother    Lung disease Father    Cancer - Prostate Father    Healthy Brother    Healthy Brother    Heart failure Maternal Uncle     Social History   Tobacco Use   Smoking status: Former Smoker   Smokeless tobacco: Never Used  Substance Use Topics   Alcohol use: Yes    Comment: wine sometimes   Drug use: No    Home Medications Prior to Admission medications   Medication Sig Start Date End Date Taking? Authorizing Provider  aspirin 81 MG tablet Take 81 mg by mouth daily.    [provider]  atorvastatin (LIPITOR) 20 MG tablet Take 20 mg by mouth 3 (three) times a week.    [provider]  Biotin 5000 MCG CAPS Take 5,000 mcg by mouth daily.     [provider]  cholecalciferol (VITAMIN D) 1000 units tablet Take 1,000 Units by  mouth daily.     [provider]  escitalopram (LEXAPRO) 10 MG tablet Take 10 mg by mouth at bedtime.    [provider]  felodipine (PLENDIL) 5 MG 24 hr tablet Take 5 mg by mouth daily.    [provider]  indapamide (LOZOL) 2.5 MG tablet Take 2.5 mg by mouth daily.    [provider]  levothyroxine (SYNTHROID, LEVOTHROID) 50 MCG tablet Take 50 mcg by mouth every evening.     [provider]  Melatonin 5 MG CAPS Take 1 capsule (5 mg total) by mouth at bedtime. 05/16/19   Dohmeier, Porfirio Mylar, MD  methenamine (UREX) 1 G tablet Take 1 g by mouth 2 (two) times daily with a meal.    [provider]  metroNIDAZOLE (METROCREAM) 0.75 % cream Apply 1 application topically 2 (two) times daily as needed (for rosacea).    [provider]  traZODone (DESYREL) 50 MG tablet Take one half tablet at bedtime if needed for insomnia. 05/16/19   Dohmeier, Porfirio Mylar, MD    Allergies    Metronidazole and related, Ciprofloxacin, Etodolac, Naproxen, Nitrofurantoin, Sulfonamide derivatives, Alprazolam, Epinephrine, and Macrobid [nitrofurantoin macrocrystal]  Review of Systems   Review of Systems  All other systems reviewed and are negative.   Physical Exam Updated Vital Signs BP (!) 150/58    Pulse (!) 46    Temp 98.4 F (36.9 C) (Oral)    Resp (!) 22    SpO2 95%   Physical Exam Vitals and nursing note reviewed.  Constitutional:      General: She is not in acute distress.    Appearance: She is well-developed.  HENT:     Head: Normocephalic.     Comments: Periorbital ecchymoses bilaterally    Right Ear: External ear normal.     Left Ear: External ear normal.  Eyes:     General: No scleral icterus.       Right eye: No discharge.        Left eye: No discharge.     Extraocular Movements: Extraocular movements intact.     Conjunctiva/sclera: Conjunctivae normal.     Comments: No hyphema, extraocular movement intact  Neck:     Trachea: No tracheal  deviation.  Cardiovascular:     Rate and Rhythm: Normal rate and regular rhythm.  Pulmonary:     Effort: Pulmonary effort is normal. No respiratory distress.     Breath sounds: Normal breath sounds. No stridor. No wheezing or rales.  Abdominal:  General: Bowel sounds are normal. There is no distension.     Palpations: Abdomen is soft.     Tenderness: There is no abdominal tenderness. There is no guarding or rebound.  Musculoskeletal:        General: No tenderness.     Cervical back: Neck supple.  Skin:    General: Skin is warm and dry.     Findings: No rash.  Neurological:     Mental Status: She is alert.     Cranial Nerves: No cranial nerve deficit (no facial droop, extraocular movements intact, no slurred speech).     Sensory: No sensory deficit.     Motor: No abnormal muscle tone or seizure activity.     Coordination: Coordination normal.     ED Results / Procedures / Treatments   Labs (all labs ordered are listed, but only abnormal results are displayed) Labs Reviewed - No data to display  EKG None  Radiology CT Head Wo Contrast  Result Date: 06/18/2019 CLINICAL DATA:  86 year old female with fall. EXAM: CT HEAD WITHOUT CONTRAST CT CERVICAL SPINE WITHOUT CONTRAST TECHNIQUE: Multidetector CT imaging of the head and cervical spine was performed following the standard protocol without intravenous contrast. Multiplanar CT image reconstructions of the cervical spine were also generated. COMPARISON:  Head CT dated 04/30/2015. FINDINGS: CT HEAD FINDINGS Brain: There is mild age-related atrophy and chronic microvascular ischemic changes. There is no acute intracranial hemorrhage. No mass effect or midline shift. No extra-axial fluid collection. Vascular: No hyperdense vessel or unexpected calcification. Skull: Normal. Negative for fracture or focal lesion. Sinuses/Orbits: There is a depressed fracture of the right orbital floor with slight herniation of the right orbital fat.  Correlation with clinical exam is recommended to exclude ocular entrapment. There is diffuse mucoperiosteal thickening of paranasal sinuses. Small air-fluid level in the right maxillary sinus consistent with hemosinus. Slight step-off of the right nasal bone may be chronic. Correlation with clinical exam recommended to evaluate for a possible fracture. The mastoid air cells are clear. Other: Right forehead hematoma. CT CERVICAL SPINE FINDINGS Alignment: No acute subluxation. Grade 1 C3-C4 retrolisthesis. Skull base and vertebrae: No acute fracture. Incomplete bony fusion versus old fracture of the right posterior arch of C1 with nonunion. Soft tissues and spinal canal: No prevertebral fluid or swelling. No visible canal hematoma. Disc levels: Multilevel degenerative changes with facet hypertrophy. Upper chest: Not visualized. Other: Mild bilateral carotid bulb calcified plaques. IMPRESSION: 1. No acute intracranial hemorrhage. Mild age-related atrophy and chronic microvascular ischemic changes. 2. No acute/traumatic cervical spine pathology. Multilevel degenerative changes. 3. Depressed fracture of the right orbital floor with slight herniation of the right orbital fat. Correlation with clinical exam is recommended to exclude ocular entrapment. Electronically Signed   By: Elgie Collard M.D.   On: 06/18/2019 21:47   CT Cervical Spine Wo Contrast  Result Date: 06/18/2019 CLINICAL DATA:  86 year old female with fall. EXAM: CT HEAD WITHOUT CONTRAST CT CERVICAL SPINE WITHOUT CONTRAST TECHNIQUE: Multidetector CT imaging of the head and cervical spine was performed following the standard protocol without intravenous contrast. Multiplanar CT image reconstructions of the cervical spine were also generated. COMPARISON:  Head CT dated 04/30/2015. FINDINGS: CT HEAD FINDINGS Brain: There is mild age-related atrophy and chronic microvascular ischemic changes. There is no acute intracranial hemorrhage. No mass effect or  midline shift. No extra-axial fluid collection. Vascular: No hyperdense vessel or unexpected calcification. Skull: Normal. Negative for fracture or focal lesion. Sinuses/Orbits: There is a depressed fracture of the  right orbital floor with slight herniation of the right orbital fat. Correlation with clinical exam is recommended to exclude ocular entrapment. There is diffuse mucoperiosteal thickening of paranasal sinuses. Small air-fluid level in the right maxillary sinus consistent with hemosinus. Slight step-off of the right nasal bone may be chronic. Correlation with clinical exam recommended to evaluate for a possible fracture. The mastoid air cells are clear. Other: Right forehead hematoma. CT CERVICAL SPINE FINDINGS Alignment: No acute subluxation. Grade 1 C3-C4 retrolisthesis. Skull base and vertebrae: No acute fracture. Incomplete bony fusion versus old fracture of the right posterior arch of C1 with nonunion. Soft tissues and spinal canal: No prevertebral fluid or swelling. No visible canal hematoma. Disc levels: Multilevel degenerative changes with facet hypertrophy. Upper chest: Not visualized. Other: Mild bilateral carotid bulb calcified plaques. IMPRESSION: 1. No acute intracranial hemorrhage. Mild age-related atrophy and chronic microvascular ischemic changes. 2. No acute/traumatic cervical spine pathology. Multilevel degenerative changes. 3. Depressed fracture of the right orbital floor with slight herniation of the right orbital fat. Correlation with clinical exam is recommended to exclude ocular entrapment. Electronically Signed   By: Elgie Collard M.D.   On: 06/18/2019 21:47   DG Knee Complete 4 Views Left  Result Date: 06/19/2019 CLINICAL DATA:  Fall yesterday with left knee pain, initial encounter EXAM: LEFT KNEE - COMPLETE 4+ VIEW COMPARISON:  None. FINDINGS: No evidence of fracture, dislocation, or joint effusion. No evidence of arthropathy or other focal bone abnormality. Soft tissues are  unremarkable. IMPRESSION: No acute abnormality noted. Electronically Signed   By: Alcide Clever M.D.   On: 06/19/2019 22:07    Procedures Procedures (including critical care time)  Medications Ordered in ED Medications - No data to display  ED Course  I have reviewed the triage vital signs and the nursing notes.  Pertinent labs & imaging results that were available during my care of the patient were reviewed by me and considered in my medical decision making (see chart for details).  Clinical Course as of Jun 19 2231  Tue Jun 19, 2019  2232 Discussed findings with patient's daughter   [JK]    Clinical Course User Index [JK] Linwood Dibbles, MD   MDM Rules/Calculators/A&P                     Patient CT scan demonstrates a right orbital floor fracture but not a skull fracture.  There is some herniation of the right orbital fat .  the patient does not have any intracranial hemorrhage.  Patient does not have any limited of her eye movements to suggest entrapment.    Knee xray without fracture  Case reviewed with Dr Merry Proud.  Can follow up in the office in 1-2 weeks Final Clinical Impression(s) / ED Diagnoses Final diagnoses:  Contusion of left knee, initial encounter  Closed fracture of right orbital floor, initial encounter [facility])    Rx / DC Orders ED Discharge Orders    None       Linwood Dibbles, MD 06/19/19 2233

## 2019-06-19 NOTE — ED Notes (Signed)
daughter Coy Saunas  [phone] please call with an update

## 2019-06-19 NOTE — ED Notes (Signed)
Patient verbalizes understanding of discharge instructions. Opportunity for questioning and answers were provided. Armband removed by staff, pt discharged from ED to home via POV with daughter 

## 2019-06-19 NOTE — ED Notes (Signed)
Pt returned from xray

## 2019-06-19 NOTE — ED Notes (Signed)
Pt called daughter to come pick her up. Pt stated she is leaving AMA. Pt advised to return if symptoms worsen. This tech removed IV.

## 2019-06-19 NOTE — Telephone Encounter (Signed)
I am out of the office, stay home order from [facility] at work. I am awaiting a covid test- no way I can see this patient face to face.

## 2019-06-19 NOTE — Telephone Encounter (Signed)
I will be working virtually for the near future, awaiting Covid test. Please have this patient seen by trauma/ ENT surgeon in hospital.

## 2019-06-19 NOTE — Telephone Encounter (Signed)
Pts daughter said that yesterday her mother fell and hit her head. She is calling saying that her mother needs to be seen Urgently. She said that they went to the hospital yesterday but left because of the wait. She said that they did take a CT scan while she waited. Her daughter stated that her mother is experiencing dizziness and throwing up. You can reach the daughter at [phone] advise what the pt should do.   Thank you

## 2019-06-19 NOTE — Discharge Instructions (Signed)
Ice to help with swelling.   Avoid driving, ladders, operating machinery for the next couple of weeks. Please review the discharge instructions

## 2019-06-19 NOTE — Telephone Encounter (Signed)
Called the patient's daughter. There was no answer. LVM informing the patient's daughter that the patient should be taken back to hospital, she call call [phone]. To have her taken via ambulance. Left a detailed message advising that I had Dr Dohmeier review the CT scan that was completed in the ED and with the depresses skull fx that was present as well and the patient have vomiting this would need to be evaluated by trauma team in ED. Advised they could call back but out recommendation is for her to go back to ED

## 2019-06-27 ENCOUNTER — Institutional Professional Consult (permissible substitution): Payer: Medicare PPO | Admitting: Plastic Surgery

## 2019-07-04 ENCOUNTER — Encounter: Payer: Self-pay | Admitting: Neurology

## 2019-07-04 ENCOUNTER — Ambulatory Visit: Payer: Medicare PPO | Admitting: Neurology

## 2019-07-04 ENCOUNTER — Other Ambulatory Visit: Payer: Self-pay

## 2019-07-04 VITALS — BP 137/74 | HR 55 | Temp 97.2°F | Ht 65.0 in | Wt 183.0 lb

## 2019-07-04 DIAGNOSIS — G4733 Obstructive sleep apnea (adult) (pediatric): Secondary | ICD-10-CM | POA: Diagnosis not present

## 2019-07-04 DIAGNOSIS — H903 Sensorineural hearing loss, bilateral: Secondary | ICD-10-CM

## 2019-07-04 DIAGNOSIS — R269 Unspecified abnormalities of gait and mobility: Secondary | ICD-10-CM | POA: Diagnosis not present

## 2019-07-04 NOTE — Patient Instructions (Signed)

## 2019-07-04 NOTE — Addendum Note (Signed)
Addended by: Melvyn Novas on: 07/04/2019 03:48 PM   Modules accepted: Orders

## 2019-07-04 NOTE — Progress Notes (Signed)
SLEEP MEDICINE CLINIC    Provider:  Melvyn Novas, MD  Primary Neurologist : Lesia Sago, MD   Referring Provider: Geoffry Paradise, Md [address] [place],  [place] [address]          Chief Complaint according to patient   Patient presents with:    . New Patient (Initial Visit)     Paper referral from Dr. Jacky Kindle for OSA. Patient of Dr Anne Hahn 2018. Had prior sleep study in 2008 with Dr. Vickey Huger.  Attached to referral.       HISTORY OF PRESENT ILLNESS:  Autumn Allen is a 86 y.o. year old Caucasian female patient seen here in a re-visit on 07/04/2019, She had a massive injury after a fall, leaving her with a hygroma on the right eyebrow, an orbital fracture was noted. Her ophthalmologist remained unconcerned about the orbit. She debnies double vision.  She continues to present a bluish face, hematoma under both eyes, on both cheekbone.   We discussed if she needs still to see a trauma surgeon, but she decided against it.  She fall as a lot.  Walks with walker . Wants me to order a sleep study for her. Needs new hearing h aids, she is profoundly deaf.  Here with Autumn Allen , a former colleague.      Last time referred by Dr Jacky Kindle.   Chief concern according to patient :  I have been going to [facility] for my CPAP machine , the current one is 84 years old and needs replacement.  She also reports insomnia, since her son passed away 05/24/19, 3 weeks ago, just before Thanksgiving. He died of cancer. He was a Lexicographer. She is in acute grief.     [name]  Is  a right -handed White or Caucasian female with a medical history of Allergy, Aortic stenosis, Arthritis, Chronic cystitis, Depression, Gait disturbance (06/10/2014), cardiovascular stress test, echocardiogram, Hyperlipemia, Hypertension, Hypothyroid, OSA on CPAP since 2008, Palpitations, Plantar fasciitis, S/P cholecystectomy, S/P TAH-BSO, Sleep apnea, and TIA ( Dr Anne Hahn, transient ischemic  attack) now presenting with insomnia related to acute grief.    The patient had the first sleep study in the year 2008 with a result of an AHI ( Apnea Hypopnea index)  of 35.5 ,and thunderous snoring- and  an oxygen saturation Nadir at SP02 75%. She was titrated from 4 cm to 7 cm water at the time.     Sleep relevant medical history: No Nocturia on CPAP ,but  incontinence at night.   Family medical /sleep history: No other family member on CPAP with OSA, insomnia, no sleep walkers.    Social history:  Patient is retired from Agricultural consultant, elementary school - and volunteers at the [employer].  she lives in  [place] in a senior community of 200 members. [facility].  Family status is widowed, she just a son 05/24/19, with 3 children, one living. Pets are not present. Tobacco use - quit over 35 years ago.  ETOH use ; wine , 3 a week-, Caffeine intake in form of Coffee( decaff) Soda(none) Tea ( none) nor energy drinks. Regular exercise in form of walk. Hobbies : reading, classic music.      Sleep habits are as follows: The patient's dinner time is between 6.30 PM. The patient goes to bed at midnight  PM and continues to sleep for 6-8 hours, she doesn't wake for bathroom breaks. This has acutely changed with  the death of her son, hyla coard.     The preferred sleep position is lateral, with the support of 1 pillow. Dreams are reportedlyfrequent/vivid .  8-9 AM is the usual rise time.  The patient wakes up spontaneously.  She reports not feeling refreshed or restored in AM, with symptoms such as dry mouth, some morning headaches.  Review of Systems: Out of a complete 14 system review, the patient complains of only the following symptoms, and all other reviewed systems are negative.:  Fatigue, sleepiness , snoring when not on CPAP , fragmented sleep,   nsomnia due to grief !!!   How likely are you to doze in the following situations: 0 = not likely, 1 = slight chance, 2 = moderate  chance, 3 = high chance   Sitting and Reading? Watching Television? Sitting inactive in a public place (theater or meeting)? As a passenger in a car for an hour without a break? Lying down in the afternoon when circumstances permit? Sitting and talking to someone? Sitting quietly after lunch without alcohol? In a car, while stopped for a few minutes in traffic?   Total = 7/ 24 points   FSS endorsed at 19/ 63 points.   Social History   Socioeconomic History  . Marital status: Widowed    Spouse name: Not on file  . Number of children: 2  . Years of education: college gr  . Highest education level: Not on file  Occupational History  . Occupation: retired Runner, broadcasting/film/video  Tobacco Use  . Smoking status: Former Games developer  . Smokeless tobacco: Never Used  Substance and Sexual Activity  . Alcohol use: Yes    Comment: wine sometimes  . Drug use: No  . Sexual activity: Not on file  Other Topics Concern  . Not on file  Social History Narrative   Patient is right handed.    Patient drinks about 2-3 cups of caffeine daily- decaf coffee.   Patient lives alone at [facility] independent living.   Social Determinants of Health   Financial Resource Strain:   . Difficulty of Paying Living Expenses: Not on file  Food Insecurity:   . Worried About Programme researcher, broadcasting/film/video in the Last Year: Not on file  . Ran Out of Food in the Last Year: Not on file  Transportation Needs:   . Lack of Transportation (Medical): Not on file  . Lack of Transportation (Non-Medical): Not on file  Physical Activity:   . Days of Exercise per Week: Not on file  . Minutes of Exercise per Session: Not on file  Stress:   . Feeling of Stress : Not on file  Social Connections:   . Frequency of Communication with Friends and Family: Not on file  . Frequency of Social Gatherings with Friends and Family: Not on file  . Attends Religious Services: Not on file  . Active Member of Clubs or Organizations: Not on file  .  Attends Banker Meetings: Not on file  . Marital Status: Not on file    Family History  Problem Relation Age of Onset  . Heart attack Mother 53  . Heart failure Mother   . Lung disease Father   . Cancer - Prostate Father   . Healthy Brother   . Healthy Brother   . Heart failure Maternal Uncle     Past Medical History:  Diagnosis Date  . Allergy   . Aortic stenosis    a. mild to mod by Echo  07/2012  . Arthritis   . Cataracts, bilateral   . Chronic cystitis   . Depression   . Gait disturbance 06/10/2014  . Hx of cardiovascular stress test    a. ETT-Echo 3/12:  EF 60%, normal study  . Hx of echocardiogram    a. Echo 2/14:  Mild LVH, EF 60-65%, Gr 1 diast dysfn, mild to mod AS, mean 17 mmHg, AVA 1.3 (VTI), trivial MR, mild LAE, PASP 31   . Hyperlipemia   . Hypertension   . Hypothyroid   . OSA on CPAP   . Palpitations    a. event monitor 3/14:  NSR, sinus brady  . Plantar fasciitis   . S/P cholecystectomy   . S/P TAH-BSO   . Sleep apnea   . Stroke [facility])    tia 2016  . TIA (transient ischemic attack)     Past Surgical History:  Procedure Laterality Date  . ABDOMINAL HYSTERECTOMY    . CHOLECYSTECTOMY    . EP IMPLANTABLE DEVICE N/A 06/29/2016   Procedure: Loop Recorder Insertion;  Surgeon: Will Meredith Leeds, MD;  Location: [place] [facility];  Service: Cardiovascular;  Laterality: N/A;  . INCONTINENCE SURGERY    . LOOP RECORDER REMOVAL N/A 08/10/2017   Procedure: LOOP RECORDER REMOVAL;  Surgeon: Constance Haw, MD;  Location: [place] [facility];  Service: Cardiovascular;  Laterality: N/A;  . TONSILLECTOMY       Current Outpatient Medications on File Prior to Visit  Medication Sig Dispense Refill  . aspirin 81 MG tablet Take 81 mg by mouth daily.    Marland Kitchen atorvastatin (LIPITOR) 20 MG tablet Take 20 mg by mouth 3 (three) times a week.    . Biotin 5000 MCG CAPS Take 5,000 mcg by mouth daily.     . cholecalciferol (VITAMIN D) 1000 units tablet Take 1,000  Units by mouth daily.     Marland Kitchen escitalopram (LEXAPRO) 10 MG tablet Take 10 mg by mouth at bedtime.    . felodipine (PLENDIL) 5 MG 24 hr tablet Take 5 mg by mouth daily.    . indapamide (LOZOL) 2.5 MG tablet Take 2.5 mg by mouth daily.    Marland Kitchen levothyroxine (SYNTHROID, LEVOTHROID) 50 MCG tablet Take 50 mcg by mouth every evening.     . Melatonin 5 MG CAPS Take 1 capsule (5 mg total) by mouth at bedtime.  0  . methenamine (UREX) 1 G tablet Take 1 g by mouth 2 (two) times daily with a meal.    . metroNIDAZOLE (METROCREAM) 0.75 % cream Apply 1 application topically 2 (two) times daily as needed (for rosacea).    . traZODone (DESYREL) 50 MG tablet Take one half tablet at bedtime if needed for insomnia. 30 tablet 1   No current facility-administered medications on file prior to visit.    Allergies  Allergen Reactions  . Metronidazole And Related Other (See Comments)    unknown  . Ciprofloxacin Other (See Comments)    Reaction unknown  . Etodolac Other (See Comments)    Reaction unknown  . Naproxen Other (See Comments)    Reaction unknown  . Nitrofurantoin Other (See Comments)    Reaction unknown  . Sulfonamide Derivatives Other (See Comments)    Reaction unknown  . Alprazolam Rash  . Epinephrine Palpitations  . Macrobid [Nitrofurantoin Macrocrystal] Rash    Physical exam:  Today's Vitals   07/04/19 1506  BP: 137/74  Pulse: (!) 55  Temp: (!) 97.2 F (36.2 C)  Weight: 183 lb (  83 kg)  Height: 5\' 5"  (1.651 m)   Body mass index is 30.45 kg/m.   Wt Readings from Last 3 Encounters:  07/04/19 183 lb (83 kg)  05/16/19 188 lb (85.3 kg)  08/22/18 178 lb (80.7 kg)     Ht Readings from Last 3 Encounters:  07/04/19 5\' 5"  (1.651 m)  05/16/19 5\' 5"  (1.651 m)  08/22/18 5\' 5"  (1.651 m)      General: The patient is awake, alert and appears not in acute distress. The patient is well groomed. Head: Normocephalic, atraumatic. Neck is supple. Mallampati 3 ,  neck circumference:16 inches .   Nasal airflow patent.  Retrognathia is not  seen.  Dental status:  Cardiovascular:  Regular rate and cardiac rhythm by pulse,  without distended neck veins. Respiratory: Lungs are clear to auscultation.  Skin:  Without evidence of ankle edema, or rash. Trunk: The patient's posture is erect.   Neurologic exam : The patient is awake and alert, oriented to place and time.   Memory subjective described as intact.  Attention span & concentration ability appears normal.  Speech is fluent,  without  dysarthria, dysphonia or aphasia.  Mood and affect are appropriate.   Cranial nerves: no loss of smell or taste reported  Pupils are equal and briskly reactive to light. Funduscopic exam deferred.  Extraocular movements in vertical and horizontal planes were intact and without nystagmus. No Diplopia. Visual fields by finger perimetry are intact. Hearing was intact to soft voice and finger rubbing.    Facial sensation intact to fine touch.  Facial motor strength is symmetric and tongue and uvula move midline.  Neck ROM : rotation, tilt and flexion extension were restricted  for age and shoulder shrug was symmetrical.    Gait and station: Patient could rise unassisted from a seated position, walked with a walker as assistive device.  Stance is of normal width/ base and the patient turned with 4 steps.  Toe and heel walk were deferred.  Deep tendon reflexes: in the upper and lower extremities are symmetric and intact.  Babinski response was deferred .        After spending a total time of  35 minutes face to face and additional time for physical and neurologic examination, review of laboratory studies,  personal review of imaging studies, reports and results of other testing and review of referral information / records as far as provided in visit, I have established the following assessments:  1) acute grief related Insomnia.  2) OSA with need for a new CPAP machine, repeat sleep study ordered.    3) her facial injuries are not neurological.     My Plan is to proceed with:  1) repeat sleep test , attended sleep study- please order as PSG this time.  2) Trazodone for a sleep aid.  3) melatonin to continue at 5 mg or less.   I would like to thank 14/02/20, MD and , Md [address] [place],  Geoffry Paradise Geoffry Paradise for allowing me to meet with and to take care of this pleasant patient.   In short, Autumn Allen is presenting with grief related new onset insomnia  While using CPAP machine that is in need of being replaced.  I plan to follow up either personally or through our NP within 3 month.   CC: I will share my notes with Dr Waterford, Dr [place]. [phone]   Electronically signed by: Nigel Bridgeman, MD 07/04/2019 3:29 PM  [facility] and [employer] certified by [employer] of Sleep Medicine and Diplomate of the [employer] of Sleep Medicine. Board certified In Neurology through the [place], Fellow of the [employer] of Neurology. Medical Director of [employer].

## 2019-07-18 ENCOUNTER — Institutional Professional Consult (permissible substitution): Payer: Medicare PPO | Admitting: Plastic Surgery

## 2019-08-13 LAB — HEPATIC FUNCTION PANEL
ALT: 18 (ref 7–35)
AST: 18 (ref 13–35)
Alkaline Phosphatase: 55 (ref 25–125)
Bilirubin, Total: 0.7

## 2019-08-13 LAB — BASIC METABOLIC PANEL
BUN: 19 (ref 4–21)
CO2: 29 — AB (ref 13–22)
Chloride: 101 (ref 99–108)
Creatinine: 0.9 (ref 0.5–1.1)
Glucose: 175
Potassium: 3.8 (ref 3.4–5.3)
Sodium: 142 (ref 137–147)

## 2019-08-13 LAB — CBC AND DIFFERENTIAL
HCT: 47 — AB (ref 36–46)
Hemoglobin: 15 (ref 12.0–16.0)
Platelets: 228 (ref 150–399)
WBC: 7.7

## 2019-08-13 LAB — HEMOGLOBIN A1C: Hemoglobin A1C: 6.1

## 2019-08-13 LAB — COMPREHENSIVE METABOLIC PANEL
Albumin: 3.6 (ref 3.5–5.0)
Calcium: 8.4 — AB (ref 8.7–10.7)

## 2019-08-13 LAB — TSH: TSH: 2.19 (ref 0.41–5.90)

## 2019-08-13 LAB — LIPID PANEL
Cholesterol: 153 (ref 0–200)
HDL: 39 (ref 35–70)
LDL Cholesterol: 78
LDl/HDL Ratio: 3.9
Triglycerides: 178 — AB (ref 40–160)

## 2019-08-13 LAB — CBC: RBC: 4.5 (ref 3.87–5.11)

## 2019-08-27 ENCOUNTER — Encounter: Payer: Self-pay | Admitting: Cardiology

## 2019-08-27 ENCOUNTER — Ambulatory Visit: Payer: Medicare PPO | Admitting: Cardiology

## 2019-08-27 ENCOUNTER — Other Ambulatory Visit: Payer: Self-pay

## 2019-08-27 VITALS — BP 134/58 | HR 52 | Ht 65.0 in | Wt 184.0 lb

## 2019-08-27 DIAGNOSIS — R002 Palpitations: Secondary | ICD-10-CM

## 2019-08-27 NOTE — Progress Notes (Signed)
Electrophysiology Office Note   Date:  08/27/2019   ID:  GLENDORIS NODARSE, DOB 1929-11-30, MRN [id number]  PCP:  Geoffry Paradise, MD  Primary Electrophysiologist:  Kateryna Grantham Jorja Loa, MD    No chief complaint on file.    History of Present Illness: MICAIAH REMILLARD is a 86 y.o. female who presents today for electrophysiology evaluation.   History of hypertension, hyperlipidemia, TIA, obstructive sleep apnea, palpitations. She wore a 30 day monitor that showed no evidence of tachycardia or rhythm abnormality. LINQ monitor implanted 06/29/16 was explanted February 2019.  Today, denies symptoms of palpitations, chest pain, shortness of breath, orthopnea, PND, lower extremity edema, claudication, dizziness, presyncope, syncope, bleeding, or neurologic sequela. The patient is tolerating medications without difficulties.  Overall she is doing well.  She has no chest pain.  She does get some minimal shortness of breath, but this does not bother her much.  She is able do all of her daily activities without restriction.   Past Medical History:  Diagnosis Date  . Allergy   . Aortic stenosis    a. mild to mod by Echo 07/2012  . Arthritis   . Cataracts, bilateral   . Chronic cystitis   . Depression   . Gait disturbance 06/10/2014  . Hx of cardiovascular stress test    a. ETT-Echo 3/12:  EF 60%, normal study  . Hx of echocardiogram    a. Echo 2/14:  Mild LVH, EF 60-65%, Gr 1 diast dysfn, mild to mod AS, mean 17 mmHg, AVA 1.3 (VTI), trivial MR, mild LAE, PASP 31   . Hyperlipemia   . Hypertension   . Hypothyroid   . OSA on CPAP   . Palpitations    a. event monitor 3/14:  NSR, sinus brady  . Plantar fasciitis   . S/P cholecystectomy   . S/P TAH-BSO   . Sleep apnea   . Stroke [facility])    tia 2016  . TIA (transient ischemic attack)    Past Surgical History:  Procedure Laterality Date  . ABDOMINAL HYSTERECTOMY    . CHOLECYSTECTOMY    . EP IMPLANTABLE DEVICE N/A 06/29/2016    Procedure: Loop Recorder Insertion;  Surgeon: Ellington Cornia Jorja Loa, MD;  Location: [facility];  Service: Cardiovascular;  Laterality: N/A;  . INCONTINENCE SURGERY    . LOOP RECORDER REMOVAL N/A 08/10/2017   Procedure: LOOP RECORDER REMOVAL;  Surgeon: Regan Lemming, MD;  Location: [facility];  Service: Cardiovascular;  Laterality: N/A;  . TONSILLECTOMY       Current Outpatient Medications  Medication Sig Dispense Refill  . aspirin 81 MG tablet Take 81 mg by mouth daily.    Marland Kitchen atorvastatin (LIPITOR) 20 MG tablet Take 20 mg by mouth 3 (three) times a week.    . Biotin 5000 MCG CAPS Take 5,000 mcg by mouth daily.     . cholecalciferol (VITAMIN D) 1000 units tablet Take 1,000 Units by mouth daily.     Marland Kitchen escitalopram (LEXAPRO) 10 MG tablet Take 10 mg by mouth at bedtime.    . felodipine (PLENDIL) 5 MG 24 hr tablet Take 5 mg by mouth daily.    . indapamide (LOZOL) 2.5 MG tablet Take 2.5 mg by mouth daily.    Marland Kitchen levothyroxine (SYNTHROID, LEVOTHROID) 50 MCG tablet Take 50 mcg by mouth every evening.     . Melatonin 5 MG CAPS Take 1 capsule (5 mg total) by mouth at bedtime.  0  . methenamine (UREX) 1  G tablet Take 1 g by mouth 2 (two) times daily with a meal.    . metroNIDAZOLE (METROCREAM) 0.75 % cream Apply 1 application topically 2 (two) times daily as needed (for rosacea).    . traZODone (DESYREL) 50 MG tablet Take one half tablet at bedtime if needed for insomnia. 30 tablet 1   No current facility-administered medications for this visit.    Allergies:   Metronidazole and related, Ciprofloxacin, Etodolac, Naproxen, Nitrofurantoin, Sulfonamide derivatives, Alprazolam, Epinephrine, and Macrobid [nitrofurantoin macrocrystal]   Social History:  The patient  reports that she has quit smoking. She has never used smokeless tobacco. She reports current alcohol use. She reports that she does not use drugs.   Family History:  The patient's family history includes Cancer - Prostate in  her father; Healthy in her brother and brother; Heart attack (age of onset: 103) in her mother; Heart failure in her maternal uncle and mother; Lung disease in her father.   ROS:  Please see the history of present illness.   Otherwise, review of systems is positive for none.   All other systems are reviewed and negative.   PHYSICAL EXAM: VS:  BP (!) 134/58   Pulse (!) 52   Ht 5\' 5"  (1.651 m)   Wt 184 lb (83.5 kg)   SpO2 94%   BMI 30.62 kg/m  , BMI Body mass index is 30.62 kg/m. GEN: Well nourished, well developed, in no acute distress  HEENT: normal  Neck: no JVD, carotid bruits, or masses Cardiac: RRR; no murmurs, rubs, or gallops,no edema  Respiratory:  clear to auscultation bilaterally, normal work of breathing GI: soft, nontender, nondistended, + BS MS: no deformity or atrophy  Skin: warm and dry Neuro:  Strength and sensation are intact Psych: euthymic mood, full affect  EKG:  EKG is not ordered today. Personal review of the ekg ordered 06/20/19 shows this rhythm, PACs  Recent Labs: 06/18/2019: BUN 21; Creatinine, Ser 0.90; Hemoglobin 15.9; Platelets 250; Potassium 3.9; Sodium 138    Lipid Panel     Component Value Date/Time   CHOL 218 (H) 04/19/2015 0615   TRIG 224 (H) 04/19/2015 0615   HDL 40 (L) 04/19/2015 0615   CHOLHDL 5.5 04/19/2015 0615   VLDL 45 (H) 04/19/2015 0615   LDLCALC 133 (H) 04/19/2015 0615   LDLDIRECT 135.1 08/26/2010 0844     Wt Readings from Last 3 Encounters:  08/27/19 184 lb (83.5 kg)  07/04/19 183 lb (83 kg)  05/16/19 188 lb (85.3 kg)      Other studies Reviewed: Additional studies/ records that were reviewed today include: 08/17/2018 Review of the above records today demonstrates:   1. The left ventricle has normal systolic function with an ejection fraction of 60-65%. The cavity size was normal. There is mildly increased left ventricular wall thickness. Left ventricular diastolic Doppler parameters are consistent with impaired   relaxation.  2. The right ventricle has normal systolic function. The cavity was normal. There is no increase in right ventricular wall thickness.  3. The mitral valve is degenerative. Mild thickening of the mitral valve leaflet. Mild calcification of the mitral valve leaflet. There is mild mitral annular calcification present.  4. The tricuspid valve is normal in structure.  5. The aortic valve is tricuspid Moderate thickening of the aortic valve Moderate calcification of the aortic valve.  6. The pulmonic valve was normal in structure.  Telemetry 05/12/15 Sinus bradycardia with pac and pvc  Telemetry 05/12/16 Sinus rhythm with episodes of  artifact No evidence of arrhythmia  ASSESSMENT AND PLAN:  1.  Palpitations: 30-day monitor and Linq monitor without evidence of major arrhythmia.  Minimal palpitations.  No changes.   2. Hypertension: Pressure is well controlled.  3. Hyperlipidemia: Continue Crestor  4. OSA: CPAP compliance encouraged  5.  Murmur: Certainly continues to have a murmur on exam.  Echo shows some thickening of her mitral and aortic valve.  She is minimally symptomatic.  We Sydni Elizarraraz see her back likely on an as-needed basis, unless she becomes overtly symptomatic.  Current medicines are reviewed at length with the patient today.   The patient does not have concerns regarding her medicines.  The following changes were made today: None  Labs/ tests ordered today include:  No orders of the defined types were placed in this encounter.    Disposition:   FU with Isebella Upshur as needed months  Signed, Marenda Accardi Meredith Leeds, MD  08/27/2019 3:10 PM     [place] [address] [place] [address] [phone] (office) [phone] (fax)

## 2019-08-27 NOTE — Patient Instructions (Signed)
Medication Instructions:  Your physician recommends that you continue on your current medications as directed. Please refer to the Current Medication list given to you today.  *If you need a refill on your cardiac medications before your next appointment, please call your pharmacy*   Lab Work: None ordered If you have labs (blood work) drawn today and your tests are completely normal, you will receive your results only by: Marland Kitchen MyChart Message (if you have MyChart) OR . A paper copy in the mail If you have any lab test that is abnormal or we need to change your treatment, we will call you to review the results.   Testing/Procedures: None ordered   Follow-Up: At [facility], you and your health needs are our priority.  As part of our continuing mission to provide you with exceptional heart care, we have created designated Provider Care Teams.  These Care Teams include your primary Cardiologist (physician) and Advanced Practice Providers (APPs -  Physician Assistants and Nurse Practitioners) who all work together to provide you with the care you need, when you need it.   Your next appointment:   As needed  Thank you for choosing [facility]!!   Dory Horn, RN [phone]

## 2019-10-15 DIAGNOSIS — H532 Diplopia: Secondary | ICD-10-CM | POA: Diagnosis not present

## 2019-10-16 ENCOUNTER — Other Ambulatory Visit: Payer: Self-pay | Admitting: Ophthalmology

## 2019-10-16 DIAGNOSIS — S0285XG Fracture of orbit, unspecified, subsequent encounter for fracture with delayed healing: Secondary | ICD-10-CM

## 2019-10-16 DIAGNOSIS — H532 Diplopia: Secondary | ICD-10-CM

## 2019-10-18 DIAGNOSIS — L218 Other seborrheic dermatitis: Secondary | ICD-10-CM | POA: Diagnosis not present

## 2019-10-18 DIAGNOSIS — L820 Inflamed seborrheic keratosis: Secondary | ICD-10-CM | POA: Diagnosis not present

## 2019-10-18 DIAGNOSIS — Z85828 Personal history of other malignant neoplasm of skin: Secondary | ICD-10-CM | POA: Diagnosis not present

## 2019-10-25 DIAGNOSIS — R197 Diarrhea, unspecified: Secondary | ICD-10-CM | POA: Diagnosis not present

## 2019-11-21 ENCOUNTER — Telehealth: Payer: Self-pay

## 2019-11-21 NOTE — Telephone Encounter (Signed)
I called patient on 6/3 to see if patient was ready to schedule sleep study yet. Pt agreed to schedule since she has been vaccinated with Covid 19 vaccine. We are currently waiting for authorization from her insurance before we are able to schedule the sleep study. I received a voicemail from patient on 11/20/2019 stating that she is no longer wanting to schedule the study because [facility] told her that her insurance doesn't require this study. I called [facility] today 11/21/2019 to find out about what the patient was actually told. I spoke with Jasmine December, who happens to be the same representative that spoke with patient. The patient's CPAP humidifier is broken and she is needing a new one. [facility] informed the patient that she needed an order from Dr. Vickey Huger for that, and That is what doesn't require the sleep study. Pt's last sleep study was documented in 2008. Dr. Vickey Huger is requiring this sleep study to see if the patient's OSA has changed. Then Dr, Dohmeier will be able to order new machine and equipment for patient. I have left a voicemail for patient to call me back, so I can clear up her confusion from [facility].

## 2019-12-10 ENCOUNTER — Other Ambulatory Visit: Payer: Self-pay | Admitting: Ophthalmology

## 2019-12-10 ENCOUNTER — Other Ambulatory Visit: Payer: Medicare PPO

## 2019-12-11 ENCOUNTER — Other Ambulatory Visit: Payer: Medicare PPO

## 2020-01-05 ENCOUNTER — Ambulatory Visit
Admission: RE | Admit: 2020-01-05 | Discharge: 2020-01-05 | Disposition: A | Discharge status: Home / Self Care | Payer: Medicare PPO | Source: Ambulatory Visit | Attending: Ophthalmology | Admitting: Ophthalmology

## 2020-01-05 ENCOUNTER — Other Ambulatory Visit: Payer: Self-pay

## 2020-01-05 DIAGNOSIS — S0285XG Fracture of orbit, unspecified, subsequent encounter for fracture with delayed healing: Secondary | ICD-10-CM

## 2020-01-05 DIAGNOSIS — M2548 Effusion, other site: Secondary | ICD-10-CM | POA: Diagnosis not present

## 2020-01-05 DIAGNOSIS — S0231XA Fracture of orbital floor, right side, initial encounter for closed fracture: Secondary | ICD-10-CM | POA: Diagnosis not present

## 2020-01-05 DIAGNOSIS — G319 Degenerative disease of nervous system, unspecified: Secondary | ICD-10-CM | POA: Diagnosis not present

## 2020-01-05 DIAGNOSIS — H532 Diplopia: Secondary | ICD-10-CM

## 2020-01-05 DIAGNOSIS — I6782 Cerebral ischemia: Secondary | ICD-10-CM | POA: Diagnosis not present

## 2020-01-23 DIAGNOSIS — R197 Diarrhea, unspecified: Secondary | ICD-10-CM | POA: Diagnosis not present

## 2020-01-31 ENCOUNTER — Ambulatory Visit (INDEPENDENT_AMBULATORY_CARE_PROVIDER_SITE_OTHER): Payer: Medicare PPO | Admitting: Neurology

## 2020-01-31 ENCOUNTER — Other Ambulatory Visit: Payer: Self-pay

## 2020-01-31 DIAGNOSIS — G4733 Obstructive sleep apnea (adult) (pediatric): Secondary | ICD-10-CM | POA: Diagnosis not present

## 2020-01-31 DIAGNOSIS — F5104 Psychophysiologic insomnia: Secondary | ICD-10-CM

## 2020-01-31 DIAGNOSIS — G4734 Idiopathic sleep related nonobstructive alveolar hypoventilation: Secondary | ICD-10-CM

## 2020-01-31 DIAGNOSIS — F4381 Prolonged grief disorder: Secondary | ICD-10-CM

## 2020-01-31 DIAGNOSIS — R9431 Abnormal electrocardiogram [ECG] [EKG]: Secondary | ICD-10-CM

## 2020-01-31 DIAGNOSIS — H903 Sensorineural hearing loss, bilateral: Secondary | ICD-10-CM

## 2020-02-06 DIAGNOSIS — R197 Diarrhea, unspecified: Secondary | ICD-10-CM | POA: Diagnosis not present

## 2020-02-12 DIAGNOSIS — F5104 Psychophysiologic insomnia: Secondary | ICD-10-CM | POA: Insufficient documentation

## 2020-02-12 DIAGNOSIS — G4734 Idiopathic sleep related nonobstructive alveolar hypoventilation: Secondary | ICD-10-CM | POA: Insufficient documentation

## 2020-02-12 DIAGNOSIS — R9431 Abnormal electrocardiogram [ECG] [EKG]: Secondary | ICD-10-CM | POA: Insufficient documentation

## 2020-02-12 DIAGNOSIS — F4381 Prolonged grief disorder: Secondary | ICD-10-CM | POA: Insufficient documentation

## 2020-02-12 NOTE — Addendum Note (Signed)
Addended by: Melvyn Novas on: 02/12/2020 01:00 PM   Modules accepted: Orders

## 2020-02-12 NOTE — Procedures (Signed)
PATIENT'S NAME:  Autumn Allen, Cronk DOB:      1930-02-25      MR#:    [id number]     DATE OF RECORDING: 01/31/2020  MR REFERRING M.D.:  Geoffry Paradise, MD/ Lesia Sago, MD  Study Performed:   Baseline Polysomnogram HISTORY:   1-20-2021[name]  is an 86 year-old, right -handed  Caucasian female with a medical history of Allergy, Aortic stenosis, Arthritis, Chronic cystitis, Depression, Gait disturbance (06/10/2014), cardiovascular stress test, echocardiogram, Hyperlipemia, Hypertension, Hypothyroidism, OSA on CPAP since 2008, Palpitations, Plantar fasciitis, S/P cholecystectomy, S/P TAH-BSO, and TIA ( Dr Anne Hahn, transient ischemic attack) -now presenting with insomnia related to acute grief. Accompanied by Lujean Amel, her former colleague.   Chief concern according to patient: "I have been going to [facility] for my CPAP machine, the current one is 84 years old and needs replacement"- also: "I just don't sleep well for many month now'. The patient is followed by cardiologist Dr. Elberta Fortis, who also supported a sleep study.   The patient endorsed the Epworth Sleepiness Scale at 7 points.   The patient's weight 183 pounds with a height of 65 (inches), resulting in a BMI of 30.5 kg/m2. The patient's neck circumference measured 16 inches.  CURRENT MEDICATIONS: Aspirin, Lipitor, Biotin, Vitamin D, Lexapro, Plendil, Lozol, Synthroid, Melatonin, Urex, Desyrel   PROCEDURE:  This is a multichannel digital polysomnogram utilizing the Somnostar 11.2 system.  Electrodes and sensors were applied and monitored per AASM Specifications.   EEG, EOG, Chin and Limb EMG, were sampled at 200 Hz.  ECG, Snore and Nasal Pressure, Thermal Airflow, Respiratory Effort, CPAP Flow and Pressure, Oximetry was sampled at 50 Hz. Digital video and audio were recorded.      BASELINE STUDY: Lights Out was at 22:08 and Lights On at 04:58.  Total recording time (TRT) was 410.5 minutes, with a total sleep time (TST) of  236.5 minutes.   The patient's sleep latency was 45 minutes.  REM latency was 9.5 minutes.  The sleep efficiency was poor at only 57.6 %.     SLEEP ARCHITECTURE: WASO (Wake after sleep onset) was 142.5 minutes.  There were 17 minutes in Stage N1, 208.5 minutes Stage N2, 10.5 minutes Stage N3 and 0.5 minutes in Stage REM.  The percentage of Stage N1 was 7.2%, Stage N2 was 88.2%, Stage N3 was 4.4% and Stage R (REM sleep) was .2%.    RESPIRATORY ANALYSIS:  There were a total of 29 respiratory events:  10 obstructive apneas, 0 central apneas and 0 mixed apneas with a total of 10 apneas and an apnea index (AI) of 2.5 /hour. There were 19 hypopneas with a hypopnea index of 4.8 /hour. The patient also had 10 respiratory event related arousals (RERAs).      The total APNEA/HYPOPNEA INDEX (AHI) was 7.4/hour and the total RESPIRATORY DISTURBANCE INDEX was  0 /hour.  0 events occurred in REM sleep and 38 events in NREM. The REM AHI was  0 /hour, versus a non-REM AHI of 7.4. The patient spent 97 minutes of total sleep time in the supine position and 140 minutes in non-supine. The supine AHI was 8.1 versus a non-supine AHI of 6.9.  OXYGEN SATURATION & C02:  The Wake baseline 02 saturation was 91%, with the lowest being 83%. Time spent below 89% saturation equaled 26 minutes.  The patient was placed on 1 liter oxygen at 23.30 hours as she presented with prolonged hypoxia.   The arousals were noted as: 44  were spontaneous, 0 were associated with PLMs, 10 were associated with respiratory events. The patient had a total of 0 Periodic Limb Movements.   Audio and video analysis did not show any abnormal or unusual movements, behaviors, phonations or vocalizations.   Moderately- loud Snoring was noted. EKG: highly irregular, with frequent PACs /PVCs, seen about once a minute. See screen shot.   Post-study, the patient indicated that sleep was worse than usual.   IMPRESSION:  1. Mild Obstructive Sleep Apnea (OSA)  with prolonged hypoxemia, requiring oxygen to raise the nadir to 89%. 2. Poor sleep efficiency was not influenced by oxygen supplementation.  3. Primary Snoring 4. Abnormal EKG  RECOMMENDATIONS:  1. Advise full-night, attended, CPAP titration study to optimize therapy. the patient is I need of a new CPAP machine anyway- we will need to prove if she needs oxygen while on CPAP or not.  2. Trazodone refilled. I could not find information if the patient took her sleep aid in the lab as requested or not.    I certify that I have reviewed the entire raw data recording prior to the issuance of this report in accordance with the Standards of Accreditation of the American Academy of Sleep Medicine (AASM)   Melvyn Novas, MD Diplomat, American Board of Psychiatry and Neurology  Diplomat, American Board of Sleep Medicine Wellsite geologist, [facility] [employer]

## 2020-02-12 NOTE — Progress Notes (Signed)
Moderately- loud Snoring was noted. EKG: highly irregular, with frequent PACs /PVCs, seen about once a minute. See screen shot.   Post-study, the patient indicated that sleep was worse than usual.   IMPRESSION:  1. Mild Obstructive Sleep Apnea (OSA) with prolonged hypoxemia, requiring oxygen to raise the nadir to 89%. 2. Poor sleep efficiency was not influenced by oxygen supplementation.  3. Primary Snoring 4. Abnormal EKG  RECOMMENDATIONS:  1. Advise full-night, attended, CPAP titration study to optimize therapy. the patient is I need of a new CPAP machine anyway- we will need to prove if she needs oxygen while on CPAP or not.  2. Trazodone was not refilled- she still has a refill at [facility]. I could not find information if the patient took her sleep aid in the lab as requested or not.

## 2020-02-13 ENCOUNTER — Telehealth: Payer: Self-pay | Admitting: Neurology

## 2020-02-13 DIAGNOSIS — G4733 Obstructive sleep apnea (adult) (pediatric): Secondary | ICD-10-CM

## 2020-02-13 NOTE — Telephone Encounter (Signed)
Called patient to discuss sleep study results. No answer at this time. LVM for the patient to call back.   

## 2020-02-13 NOTE — Telephone Encounter (Signed)
-----   Message from Melvyn Novas, MD sent at 02/12/2020  1:00 PM EDT ----- Moderately- loud Snoring was noted. EKG: highly irregular, with frequent PACs /PVCs, seen about once a minute. See screen shot.   Post-study, the patient indicated that sleep was worse than usual.   IMPRESSION:  1. Mild Obstructive Sleep Apnea (OSA) with prolonged hypoxemia, requiring oxygen to raise the nadir to 89%. 2. Poor sleep efficiency was not influenced by oxygen supplementation.  3. Primary Snoring 4. Abnormal EKG  RECOMMENDATIONS:  1. Advise full-night, attended, CPAP titration study to optimize therapy. the patient is I need of a new CPAP machine anyway- we will need to prove if she needs oxygen while on CPAP or not.  2. Trazodone was not refilled- she still has a refill at [facility]. I could not find information if the patient took her sleep aid in the lab as requested or not.

## 2020-02-20 NOTE — Telephone Encounter (Signed)
I called pt. I advised pt that Dr. Dohmeier reviewed their sleep study results and found that has sleep apnea and hypoxia and recommends that pt be treated with a cpap. Dr. Dohmeier recommends that pt return for a repeat sleep study in order to properly titrate the cpap and ensure a good mask fit. Pt is agreeable to returning for a titration study. I advised pt that our sleep lab will file with pt's insurance and call pt to schedule the sleep study when we hear back from the pt's insurance regarding coverage of this sleep study. Pt verbalized understanding of results. Pt had no questions at this time but was encouraged to call back if questions arise.   

## 2020-02-21 DIAGNOSIS — R062 Wheezing: Secondary | ICD-10-CM | POA: Diagnosis not present

## 2020-02-21 DIAGNOSIS — R0609 Other forms of dyspnea: Secondary | ICD-10-CM | POA: Diagnosis not present

## 2020-02-21 DIAGNOSIS — E663 Overweight: Secondary | ICD-10-CM | POA: Diagnosis not present

## 2020-02-21 DIAGNOSIS — R7301 Impaired fasting glucose: Secondary | ICD-10-CM | POA: Diagnosis not present

## 2020-02-28 ENCOUNTER — Other Ambulatory Visit: Payer: Self-pay

## 2020-02-28 ENCOUNTER — Ambulatory Visit (INDEPENDENT_AMBULATORY_CARE_PROVIDER_SITE_OTHER): Payer: Medicare PPO | Admitting: Neurology

## 2020-02-28 DIAGNOSIS — F4381 Prolonged grief disorder: Secondary | ICD-10-CM

## 2020-02-28 DIAGNOSIS — G4734 Idiopathic sleep related nonobstructive alveolar hypoventilation: Secondary | ICD-10-CM

## 2020-02-28 DIAGNOSIS — G4733 Obstructive sleep apnea (adult) (pediatric): Secondary | ICD-10-CM | POA: Diagnosis not present

## 2020-03-12 NOTE — Procedures (Signed)
PATIENT'S NAME:  Autumn Allen, Autumn Allen DOB:      Mar 01, 1930      MR#:    [id number]     DATE OF RECORDING: 02/28/2020, MR REFERRING M.D.:  Geoffry Paradise, MD Study Performed:   CPAP  Titration HISTORY:  MAILIN COGLIANESE  is an 86- year-old, right -handed, Caucasian female with a medical history of Allergy, Aortic stenosis, Arthritis, Chronic cystitis, Depression, Gait disturbance (06/10/2014), cardiovascular stress test, echocardiogram, Hyperlipemia, Hypertension, Hypothyroidism, OSA on CPAP since 2008, Palpitations, Plantar fasciitis, S/P cholecystectomy, S/P TAH-BSO, and TIA ( Dr Anne Hahn, transient ischemic attack) -now presenting with insomnia related to acute grief.  Accompanied by Lujean Amel, her former colleague.   The patient endorsed the Epworth Sleepiness Scale at 7 points.   The patient's weight 183 pounds with a height of 65 (inches), resulting in a BMI of 30.5 kg/m2. The patient's neck circumference measured 16 inches.  Diagnostic polysomnogram performed on 01/31/2020 revealed: Mild Obstructive Sleep Apnea (OSA) with prolonged hypoxemia, requiring oxygen to raise the nadir to 89%. Poor sleep efficiency was not influenced by oxygen supplementation. Primary snoring and abnormal EKG. The patient had been provided with Trazodone but the technologist did not mention if she took the medication. I refilled Trazodone so her return would yield a better sleep efficiency.   CURRENT MEDICATIONS: Aspirin, Lipitor, Biotin, Vitamin D, Lexapro, Plendil, Lozol, Synthroid, Melatonin, Urex, Desyrel   PROCEDURE:  This is a multichannel digital polysomnogram utilizing the SomnoStar 11.2 system.  Electrodes and sensors were applied and monitored per AASM Specifications.   EEG, EOG, Chin and Limb EMG, were sampled at 200 Hz.  ECG, Snore and Nasal Pressure, Thermal Airflow, Respiratory Effort, CPAP Flow and Pressure, Oximetry was sampled at 50 Hz. Digital video and audio were recorded.      CPAP was  initiated at 5 cmH20 with heated humidity per AASM split night standards and pressure was advanced to 6/cmH20 , The patient was unable to initiate any sleep.   Lights Out was at 21:44 and Lights On at 04:12. Total recording time (TRT) was 0 minutes, with a total sleep time (TST) of 0 minutes. The patient's sleep latency was 0 minutes. REM latency was 0 minutes.  The sleep efficiency was 0 %.     OXYGEN SATURATION & C02:  The baseline 02 saturation was 94%, with the lowest being 0%. Time spent below 89% saturation equaled 0 minutes.  PERIODIC LIMB MOVEMENTS:  The patient had a total of 0 Periodic Limb Movements. The Periodic Limb Movement (PLM) index was 0 and the PLM Arousal index was 0 /hour.  The patient was bradycardic.    The patient was fitted with a ResMed P-10 medium nasal pillows mask.  DIAGNOSIS mild sleep apnea was discovered in the baseline PSG study, associated with hypoxemia. This study was invalid due to Insomnia -there was  no sleep recorded- no titration performed.    PLANS/RECOMMENDATIONS: I cannot suggest another in-lab CPAP titration - The patient was supposed to return to titration for PAP and oxygen in the same night. I send her a trazodone prescription with the intend to improve her chances of sleep in the lab.  I am unable to provide oxygen unless I can prove that the patient had AHI of 0.0/h (complete resolution) of sleep apneas and still was hypoxemic under optimal apnea therapy. Marland Kitchen     DISCUSSION: Unfortunately, I have to defer all other hypoxemia treatment and oxygen therapy to pulmonology. Chronic Insomnia as a grief related reaction should  be treated by a cognitive behavior therapist.    A follow up appointment will not be scheduled in the Sleep Clinic at [facility].   Please address chronic insomnia question with a cognitive behavior therapist and speak to your primary care physician about a pulmonology referral. You may call [phone] with  any questions.      I certify that I have reviewed the entire raw data recording prior to the issuance of this report in accordance with the Standards of Accreditation of the American Academy of Sleep Medicine (AASM)    Melvyn Novas, M.D. Diplomat, Biomedical engineer of Psychiatry and Neurology  Diplomat, Biomedical engineer of Sleep Medicine Wellsite geologist, [facility] [employer]

## 2020-03-12 NOTE — Progress Notes (Signed)
DIAGNOSIS mild sleep apnea was discovered in the baseline PSG study, associated with hypoxemia. This study was invalid due to Insomnia -there was  no sleep recorded- no titration performed.    PLANS/RECOMMENDATIONS: I cannot suggest another in-lab CPAP titration - The patient was supposed to return to titration for PAP and oxygen in the same night. I send her a trazodone prescription with the intend to improve her chances of sleep in the lab.  I am unable to provide oxygen unless I can prove that the patient had AHI of 0.0/h (complete resolution) of sleep apneas and still was hypoxemic under optimal apnea therapy. Marland Kitchen     DISCUSSION: Unfortunately, I have to defer all other hypoxemia treatment and oxygen therapy to pulmonology. Chronic Insomnia as a grief related reaction should be treated by a cognitive behavior therapist.    You may call [phone] with any questions or if you want to try auto CPAP.    An automatic return and  follow up appointment will not be scheduled in the Sleep Clinic at [facility] Please address chronic insomnia question with a cognitive behavior therapist and speak to your primary care physician about a pulmonology referral.

## 2020-04-16 ENCOUNTER — Other Ambulatory Visit: Payer: Self-pay | Admitting: Neurology

## 2020-04-16 ENCOUNTER — Telehealth: Payer: Self-pay | Admitting: Neurology

## 2020-04-16 DIAGNOSIS — R0902 Hypoxemia: Secondary | ICD-10-CM

## 2020-04-16 DIAGNOSIS — G4733 Obstructive sleep apnea (adult) (pediatric): Secondary | ICD-10-CM

## 2020-04-16 NOTE — Telephone Encounter (Signed)
Pt called wanting to speak to RN to discuss what is the process she is to follow so that she can get her cpap machine. Please advise.

## 2020-04-16 NOTE — Telephone Encounter (Addendum)
Spoke with Dr Vickey Huger, a order will be placed for auto CPAP for the patient 5-15 cm water pressure with 3 cm EPR and mask of patient's choice.  A ONO will be completed for the pt while on CPAP.   I will contact the patient and review this information and make sure she is aware of this information.

## 2020-04-16 NOTE — Addendum Note (Signed)
Addended by: Judi Cong on: 04/16/2020 05:23 PM   Modules accepted: Orders

## 2020-04-16 NOTE — Telephone Encounter (Signed)
error 

## 2020-04-17 NOTE — Telephone Encounter (Signed)
Called the patient back to advise that a order has been sent to [facility] for the new machine. Advised the patient that she will have to contact us once she has a scheduled appointment to go pick up her machine from them. When she calls back she will need to schedule a initial cpap follow up visit 31-90 days from the date she picks up the machine. Patient verbalized understanding but a letter will be sent to serve as a reminder. Address confirmed.

## 2020-05-21 DIAGNOSIS — G4733 Obstructive sleep apnea (adult) (pediatric): Secondary | ICD-10-CM | POA: Diagnosis not present

## 2020-06-21 DIAGNOSIS — G4733 Obstructive sleep apnea (adult) (pediatric): Secondary | ICD-10-CM | POA: Diagnosis not present

## 2020-06-24 DIAGNOSIS — I10 Essential (primary) hypertension: Secondary | ICD-10-CM | POA: Diagnosis not present

## 2020-06-24 DIAGNOSIS — R062 Wheezing: Secondary | ICD-10-CM | POA: Diagnosis not present

## 2020-06-24 DIAGNOSIS — J069 Acute upper respiratory infection, unspecified: Secondary | ICD-10-CM | POA: Diagnosis not present

## 2020-06-24 DIAGNOSIS — R5383 Other fatigue: Secondary | ICD-10-CM | POA: Diagnosis not present

## 2020-06-24 DIAGNOSIS — Z1152 Encounter for screening for COVID-19: Secondary | ICD-10-CM | POA: Diagnosis not present

## 2020-07-17 DIAGNOSIS — H6123 Impacted cerumen, bilateral: Secondary | ICD-10-CM | POA: Diagnosis not present

## 2020-07-21 DIAGNOSIS — H524 Presbyopia: Secondary | ICD-10-CM | POA: Diagnosis not present

## 2020-07-21 DIAGNOSIS — Z961 Presence of intraocular lens: Secondary | ICD-10-CM | POA: Diagnosis not present

## 2020-07-21 DIAGNOSIS — H532 Diplopia: Secondary | ICD-10-CM | POA: Diagnosis not present

## 2020-07-21 DIAGNOSIS — H26491 Other secondary cataract, right eye: Secondary | ICD-10-CM | POA: Diagnosis not present

## 2020-07-22 DIAGNOSIS — G4733 Obstructive sleep apnea (adult) (pediatric): Secondary | ICD-10-CM | POA: Diagnosis not present

## 2020-08-14 DIAGNOSIS — H26491 Other secondary cataract, right eye: Secondary | ICD-10-CM | POA: Diagnosis not present

## 2020-08-19 DIAGNOSIS — G4733 Obstructive sleep apnea (adult) (pediatric): Secondary | ICD-10-CM | POA: Diagnosis not present

## 2020-09-12 DIAGNOSIS — E039 Hypothyroidism, unspecified: Secondary | ICD-10-CM | POA: Diagnosis not present

## 2020-09-12 DIAGNOSIS — E785 Hyperlipidemia, unspecified: Secondary | ICD-10-CM | POA: Diagnosis not present

## 2020-09-12 DIAGNOSIS — R7301 Impaired fasting glucose: Secondary | ICD-10-CM | POA: Diagnosis not present

## 2020-09-17 DIAGNOSIS — F329 Major depressive disorder, single episode, unspecified: Secondary | ICD-10-CM | POA: Diagnosis not present

## 2020-09-17 DIAGNOSIS — I10 Essential (primary) hypertension: Secondary | ICD-10-CM | POA: Diagnosis not present

## 2020-09-17 DIAGNOSIS — E039 Hypothyroidism, unspecified: Secondary | ICD-10-CM | POA: Diagnosis not present

## 2020-09-17 DIAGNOSIS — Z1339 Encounter for screening examination for other mental health and behavioral disorders: Secondary | ICD-10-CM | POA: Diagnosis not present

## 2020-09-17 DIAGNOSIS — Z1331 Encounter for screening for depression: Secondary | ICD-10-CM | POA: Diagnosis not present

## 2020-09-17 DIAGNOSIS — G4733 Obstructive sleep apnea (adult) (pediatric): Secondary | ICD-10-CM | POA: Diagnosis not present

## 2020-09-17 DIAGNOSIS — R7301 Impaired fasting glucose: Secondary | ICD-10-CM | POA: Diagnosis not present

## 2020-09-17 DIAGNOSIS — E785 Hyperlipidemia, unspecified: Secondary | ICD-10-CM | POA: Diagnosis not present

## 2020-09-17 DIAGNOSIS — Z0000 Encounter for general adult medical examination without abnormal findings: Secondary | ICD-10-CM | POA: Diagnosis not present

## 2020-09-19 DIAGNOSIS — G4733 Obstructive sleep apnea (adult) (pediatric): Secondary | ICD-10-CM | POA: Diagnosis not present

## 2020-10-06 DIAGNOSIS — H532 Diplopia: Secondary | ICD-10-CM | POA: Diagnosis not present

## 2020-10-06 DIAGNOSIS — Z961 Presence of intraocular lens: Secondary | ICD-10-CM | POA: Diagnosis not present

## 2020-10-06 DIAGNOSIS — H26491 Other secondary cataract, right eye: Secondary | ICD-10-CM | POA: Diagnosis not present

## 2020-10-06 DIAGNOSIS — H524 Presbyopia: Secondary | ICD-10-CM | POA: Diagnosis not present

## 2020-10-19 DIAGNOSIS — G4733 Obstructive sleep apnea (adult) (pediatric): Secondary | ICD-10-CM | POA: Diagnosis not present

## 2020-10-29 ENCOUNTER — Other Ambulatory Visit: Payer: Self-pay

## 2020-10-29 ENCOUNTER — Non-Acute Institutional Stay: Payer: Medicare PPO | Admitting: Internal Medicine

## 2020-10-29 ENCOUNTER — Encounter: Payer: Self-pay | Admitting: Internal Medicine

## 2020-10-29 VITALS — BP 148/78 | HR 51 | Temp 97.7°F | Ht 65.0 in | Wt 187.6 lb

## 2020-10-29 DIAGNOSIS — G4700 Insomnia, unspecified: Secondary | ICD-10-CM

## 2020-10-29 DIAGNOSIS — E785 Hyperlipidemia, unspecified: Secondary | ICD-10-CM | POA: Diagnosis not present

## 2020-10-29 DIAGNOSIS — N3090 Cystitis, unspecified without hematuria: Secondary | ICD-10-CM | POA: Diagnosis not present

## 2020-10-29 DIAGNOSIS — G4733 Obstructive sleep apnea (adult) (pediatric): Secondary | ICD-10-CM | POA: Diagnosis not present

## 2020-10-29 DIAGNOSIS — E039 Hypothyroidism, unspecified: Secondary | ICD-10-CM

## 2020-10-29 DIAGNOSIS — R001 Bradycardia, unspecified: Secondary | ICD-10-CM | POA: Insufficient documentation

## 2020-10-29 DIAGNOSIS — I10 Essential (primary) hypertension: Secondary | ICD-10-CM | POA: Diagnosis not present
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
## 2020-11-13 DIAGNOSIS — L821 Other seborrheic keratosis: Secondary | ICD-10-CM | POA: Diagnosis not present

## 2020-11-13 DIAGNOSIS — L820 Inflamed seborrheic keratosis: Secondary | ICD-10-CM | POA: Diagnosis not present

## 2020-11-13 DIAGNOSIS — L814 Other melanin hyperpigmentation: Secondary | ICD-10-CM | POA: Diagnosis not present

## 2020-11-13 DIAGNOSIS — L570 Actinic keratosis: Secondary | ICD-10-CM | POA: Diagnosis not present

## 2020-11-19 DIAGNOSIS — G4733 Obstructive sleep apnea (adult) (pediatric): Secondary | ICD-10-CM | POA: Diagnosis not present

## 2020-12-02 ENCOUNTER — Non-Acute Institutional Stay: Payer: Medicare PPO | Admitting: Nurse Practitioner

## 2020-12-02 ENCOUNTER — Other Ambulatory Visit: Payer: Self-pay

## 2020-12-02 DIAGNOSIS — N3090 Cystitis, unspecified without hematuria: Secondary | ICD-10-CM | POA: Diagnosis not present

## 2020-12-02 DIAGNOSIS — R269 Unspecified abnormalities of gait and mobility: Secondary | ICD-10-CM | POA: Diagnosis not present

## 2020-12-02 DIAGNOSIS — E785 Hyperlipidemia, unspecified: Secondary | ICD-10-CM

## 2020-12-02 DIAGNOSIS — G459 Transient cerebral ischemic attack, unspecified: Secondary | ICD-10-CM

## 2020-12-02 DIAGNOSIS — F32A Depression, unspecified: Secondary | ICD-10-CM | POA: Diagnosis not present

## 2020-12-02 DIAGNOSIS — F5104 Psychophysiologic insomnia: Secondary | ICD-10-CM

## 2020-12-02 DIAGNOSIS — I10 Essential (primary) hypertension: Secondary | ICD-10-CM | POA: Diagnosis not present

## 2020-12-02 DIAGNOSIS — H60502 Unspecified acute noninfective otitis externa, left ear: Secondary | ICD-10-CM | POA: Diagnosis not present

## 2020-12-02 DIAGNOSIS — E039 Hypothyroidism, unspecified: Secondary | ICD-10-CM

## 2020-12-02 DIAGNOSIS — H6092 Unspecified otitis externa, left ear: Secondary | ICD-10-CM | POA: Insufficient documentation

## 2020-12-02 NOTE — Assessment & Plan Note (Signed)
not taking Trazodone

## 2020-12-02 NOTE — Assessment & Plan Note (Signed)
Uses cane.

## 2020-12-02 NOTE — Assessment & Plan Note (Signed)
the left ear pain x 4 days, denied headache, nasal congestion, sinus pressure, cough, sore throat,or TMJ pain. She is afebrile, she admitted her difficulty putting left hearing aid in. Mid left external ear canal mild erythema, swelling, mild pain reproduced by pressing tragus, not with pulling on the left auricle. No mastoid process tenderness. Will try Hydrocortisone/acetic acid ear drops, up to 5gtt tid x 7 days to the left ear.

## 2020-12-02 NOTE — Progress Notes (Signed)
Location:   clinic   Place of Service:  Clinic (12) Provider: Chipper Oman NP  Code Status: DNR Goals of Care: [place] Advanced [place]ives 12/03/2020  Does Patient Have a Medical Advance Directive? No  Type of Advance Directive -  Does patient want to make changes to medical advance directive? -  Copy of Healthcare Power of Attorney in Chart? -  Would patient like information on creating a medical advance directive? No - Patient declined     Chief Complaint  Patient presents with   Acute Visit    Patient complains of left ear pain for the last four days. Patient has been taking otc aspirin with little relief.      HPI: Patient is a 87 y.o. female seen today for an acute visit for the left ear pain x 4 days, denied headache, nasal congestion, sinus pressure, cough, sore throat,or TMJ pain. She is afebrile, she admitted her difficulty putting left hearing aid in.   Sinus Bradycardia, EKG HR 45, DOE, f/u Cardiology  OSA CPAP  HTN, takes Indapmide, Felodipine  Hyperlipidemia, takes Atorvastatin.   Recurrent cystitis, takes HipRex  Insomnia, not taking Trazodone  Hypothyroidism, takes Levothyroxine  TIA 04/2015, taking ASA, Atorvastatin  Depression, takes Escitalopram  Abnormal gait, uses cane.   Past Medical History:  Diagnosis Date   Allergy    Aortic stenosis    a. mild to mod by Echo 07/2012   Arthritis    Cataracts, bilateral    Chronic cystitis    Depression    Gait disturbance 06/10/2014   History of bone density study    History of colonoscopy    Hx of cardiovascular stress test    a. ETT-Echo 3/12:  EF 60%, normal study   Hx of echocardiogram    a. Echo 2/14:  Mild LVH, EF 60-65%, Gr 1 diast dysfn, mild to mod AS, mean 17 mmHg, AVA 1.3 (VTI), trivial MR, mild LAE, PASP 31    Hyperlipemia    Hypertension    Hypothyroid    OSA on CPAP    Palpitations    a. event monitor 3/14:  NSR, sinus brady   Plantar fasciitis    S/P cholecystectomy    S/P TAH-BSO    Sleep  apnea    Stroke [facility])    tia 2016   TIA (transient ischemic attack)     Past Surgical History:  Procedure Laterality Date   ABDOMINAL HYSTERECTOMY     CHOLECYSTECTOMY     EP IMPLANTABLE DEVICE N/A 06/29/2016   Procedure: Loop Recorder Insertion;  Surgeon: Will Jorja Loa, MD;  Location: [facility];  Service: Cardiovascular;  Laterality: N/A;   FOOT SURGERY  1955   INCONTINENCE SURGERY     LOOP RECORDER REMOVAL N/A 08/10/2017   Procedure: LOOP RECORDER REMOVAL;  Surgeon: Regan Lemming, MD;  Location: [facility];  Service: Cardiovascular;  Laterality: N/A;   TONSILLECTOMY      Allergies  Allergen Reactions   Metronidazole And Related Other (See Comments)    unknown   Bee Venom     Per new patient packet   Ciprofloxacin Other (See Comments)    Reaction unknown   Etodolac Other (See Comments)    Reaction unknown   Naproxen Other (See Comments)    Reaction unknown   Nitrofurantoin Other (See Comments)    Reaction unknown   Sulfonamide Derivatives Other (See Comments)    Reaction unknown   Alprazolam Rash   Epinephrine Palpitations  Macrobid [Nitrofurantoin Macrocrystal] Rash    Allergies as of 12/02/2020       Reactions   Metronidazole And Related Other (See Comments)   unknown   Bee Venom    Per new patient packet   Ciprofloxacin Other (See Comments)   Reaction unknown   Etodolac Other (See Comments)   Reaction unknown   Naproxen Other (See Comments)   Reaction unknown   Nitrofurantoin Other (See Comments)   Reaction unknown   Sulfonamide Derivatives Other (See Comments)   Reaction unknown   Alprazolam Rash   Epinephrine Palpitations   Macrobid [nitrofurantoin Macrocrystal] Rash        Medication List        Accurate as of December 02, 2020 11:59 PM. If you have any questions, ask your nurse or doctor.          acetic acid-hydrocortisone OTIC solution Commonly known as: VOSOL-HC Place 5 drops into the left ear 3 (three)  times daily for 7 days. Started by: Prisca Gearing X Tacori Kvamme, NP   aspirin 81 MG tablet Take 81 mg by mouth daily.   atorvastatin 20 MG tablet Commonly known as: LIPITOR Take 20 mg by mouth 3 (three) times a week.   Biotin 5000 MCG Caps Take 5,000 mcg by mouth daily.   cholecalciferol 1000 units tablet Commonly known as: VITAMIN D Take 1,000 Units by mouth daily.   escitalopram 10 MG tablet Commonly known as: LEXAPRO Take 10 mg by mouth at bedtime.   felodipine 5 MG 24 hr tablet Commonly known as: PLENDIL Take 5 mg by mouth daily.   indapamide 2.5 MG tablet Commonly known as: LOZOL Take 2.5 mg by mouth daily.   levothyroxine 50 MCG tablet Commonly known as: SYNTHROID Take 50 mcg by mouth every evening.   methenamine 1 g tablet Commonly known as: HIPREX Take 1 g by mouth 2 (two) times daily with a meal.   metroNIDAZOLE 0.75 % cream Commonly known as: METROCREAM Apply 1 application topically 2 (two) times daily as needed (for rosacea).        Review of Systems:  Review of Systems  Constitutional:  Negative for activity change, appetite change and fever.  HENT:  Positive for ear pain and hearing loss. Negative for congestion, facial swelling, rhinorrhea, sinus pressure, sinus pain, sore throat, trouble swallowing and voice change.   Respiratory:  Negative for cough and shortness of breath.   Gastrointestinal:  Negative for abdominal pain and constipation.  Genitourinary:  Negative for dysuria.  Musculoskeletal:  Positive for gait problem.  Skin:  Negative for color change.  Neurological:  Negative for speech difficulty, weakness and headaches.  Psychiatric/Behavioral:  Positive for sleep disturbance. Negative for confusion. The patient is not nervous/anxious.    Health Maintenance  Topic Date Due   Zoster Vaccines- Shingrix (1 of 2) Never done   PNA vac Low Risk Adult (1 of 2 - PCV13) Never done   COVID-19 Vaccine (4 - Booster) 07/26/2020   INFLUENZA VACCINE  01/12/2021    TETANUS/TDAP  06/14/2025   DEXA SCAN  Completed   HPV VACCINES  Aged Out    Physical Exam: Vitals:   12/03/20 0905  BP: 118/62  Pulse: (!) 53  Resp: 18  Temp: (!) 97.3 F (36.3 C)  SpO2: 95%  Weight: 186 lb 11.2 oz (84.7 kg)  Height: 5\' 5"  (1.651 m)   Body mass index is 31.07 kg/m. Physical Exam Constitutional:      Appearance: Normal appearance.  HENT:  Head: Normocephalic and atraumatic.     Ears:     Comments: Small amount of fluid behind behind eardrum R+L. Left mid external ear canal mild erythema, swelling, mild pain reproduced when the left tragus pressed. No pain when the left auricle pulled/ manipulated. No mastoid process or TMJ tenderness noted.     Nose: Nose normal.     Mouth/Throat:     Mouth: Mucous membranes are moist.  Eyes:     Extraocular Movements: Extraocular movements intact.     Conjunctiva/sclera: Conjunctivae normal.     Pupils: Pupils are equal, round, and reactive to light.  Cardiovascular:     Rate and Rhythm: Regular rhythm. Bradycardia present.     Heart sounds: Murmur heard.  Pulmonary:     Effort: Pulmonary effort is normal.     Breath sounds: No wheezing, rhonchi or rales.  Abdominal:     Palpations: Abdomen is soft.     Tenderness: There is no abdominal tenderness. There is no guarding or rebound.  Musculoskeletal:     Cervical back: Normal range of motion and neck supple.     Right lower leg: No edema.     Left lower leg: No edema.  Skin:    General: Skin is warm and dry.  Neurological:     General: No focal deficit present.     Mental Status: She is alert and oriented to person, place, and time. Mental status is at baseline.  Psychiatric:        Mood and Affect: Mood normal.        Behavior: Behavior normal.        Thought Content: Thought content normal.        Judgment: Judgment normal.    Labs reviewed: Basic Metabolic Panel: No results for input(s): NA, K, CL, CO2, GLUCOSE, BUN, CREATININE, CALCIUM, MG, PHOS,  TSH in the last 8760 hours. Liver Function Tests: No results for input(s): AST, ALT, ALKPHOS, BILITOT, PROT, ALBUMIN in the last 8760 hours. No results for input(s): LIPASE, AMYLASE in the last 8760 hours. No results for input(s): AMMONIA in the last 8760 hours. CBC: No results for input(s): WBC, NEUTROABS, HGB, HCT, MCV, PLT in the last 8760 hours. Lipid Panel: No results for input(s): CHOL, HDL, LDLCALC, TRIG, CHOLHDL, LDLDIRECT in the last 8760 hours. Lab Results  Component Value Date   HGBA1C 6.4 (H) 04/19/2015    Procedures since last visit: No results found.  Assessment/Plan Essential hypertension Blood pressure is controlled,  takes Indapmide, Felodipine  Hyperlipidemia takes Atorvastatin.   Recurrent cystitis  takes HipRex  Psychophysiological insomnia not taking Trazodone  Hypothyroidism takes Levothyroxine  TIA (transient ischemic attack) TIA 04/2015, taking ASA, Atorvastatin  Depression takes Escitalopram  Gait disturbance Uses cane   Otitis externa of left ear the left ear pain x 4 days, denied headache, nasal congestion, sinus pressure, cough, sore throat,or TMJ pain. She is afebrile, she admitted her difficulty putting left hearing aid in. Mid left external ear canal mild erythema, swelling, mild pain reproduced by pressing tragus, not with pulling on the left auricle. No mastoid process tenderness. Will try Hydrocortisone/acetic acid ear drops, up to 5gtt tid x 7 days to the left ear.     Labs/tests ordered:  none  Next appt:  04/23/2021

## 2020-12-02 NOTE — Assessment & Plan Note (Signed)
takes Atorvastatin  

## 2020-12-02 NOTE — Assessment & Plan Note (Signed)
takes HipRex

## 2020-12-02 NOTE — Assessment & Plan Note (Signed)
Blood pressure is controlled,  takes Indapmide, Felodipine

## 2020-12-02 NOTE — Assessment & Plan Note (Signed)
TIA 04/2015, taking ASA, Atorvastatin

## 2020-12-02 NOTE — Assessment & Plan Note (Signed)
takes Escitalopram

## 2020-12-02 NOTE — Assessment & Plan Note (Signed)
takes Levothyroxine 

## 2020-12-03 ENCOUNTER — Encounter: Payer: Self-pay | Admitting: Nurse Practitioner

## 2020-12-03 MED ORDER — HYDROCORTISONE-ACETIC ACID 1-2 % OT SOLN: 5.0000 [drp] | Freq: Three times a day (TID) | OTIC | 0 refills | Status: AC

## 2020-12-04 ENCOUNTER — Telehealth: Payer: Self-pay

## 2020-12-04 NOTE — Telephone Encounter (Signed)
Incoming call received from patient stating she has called this office several times today about needing something for her ear pain. I was unable to see documentation of patient calling today.  Patient states she seen [facility] 2 days ago and Manxie sent a rx to the pharmacy and the pharmacy keeps telling her the medication has not come in.  Patient states she can not wait any longer and needs treatment today.

## 2020-12-04 NOTE — Telephone Encounter (Signed)
Spoke with patient, patient states the pharmacy just called her and now the medication is in stock, will be ready for pick-up soon, and I can cancel this request

## 2020-12-05 ENCOUNTER — Ambulatory Visit (INDEPENDENT_AMBULATORY_CARE_PROVIDER_SITE_OTHER): Payer: Medicare PPO | Admitting: Family

## 2020-12-05 ENCOUNTER — Encounter: Payer: Self-pay | Admitting: Family

## 2020-12-05 ENCOUNTER — Other Ambulatory Visit: Payer: Self-pay

## 2020-12-05 VITALS — BP 160/82 | HR 53 | Temp 97.7°F | Resp 16 | Ht 65.0 in | Wt 187.0 lb

## 2020-12-05 DIAGNOSIS — B370 Candidal stomatitis: Secondary | ICD-10-CM

## 2020-12-05 DIAGNOSIS — H6012 Cellulitis of left external ear: Secondary | ICD-10-CM

## 2020-12-05 MED ORDER — NYSTATIN 100000 UNIT/ML MT SUSP: 5.0000 mL | Freq: Four times a day (QID) | OROMUCOSAL | 0 refills | Status: DC

## 2020-12-05 MED ORDER — ACETAMINOPHEN 500 MG PO TABS: 500.0000 mg | ORAL_TABLET | Freq: Three times a day (TID) | ORAL | 0 refills | Status: AC | PRN

## 2020-12-05 MED ORDER — SACCHAROMYCES BOULARDII 250 MG PO CAPS: 250.0000 mg | ORAL_CAPSULE | Freq: Two times a day (BID) | ORAL | 0 refills | Status: AC

## 2020-12-05 MED ORDER — DOXYCYCLINE HYCLATE 100 MG PO TABS: 100.0000 mg | ORAL_TABLET | Freq: Two times a day (BID) | ORAL | 0 refills | Status: AC

## 2020-12-05 NOTE — Patient Instructions (Signed)
-   Notify provider if symptoms worsen or fail to improve  °

## 2020-12-05 NOTE — Progress Notes (Signed)
Provider: Jenni Thew FNP-C  Mahlon Gammon, MD  Patient Care Team: Mahlon Gammon, MD as PCP - General (Internal Medicine) Regan Lemming, MD as PCP - Cardiology (Cardiology) Antony Contras, MD as Consulting Physician (Ophthalmology) Edwinna Areola, DO as Consulting Physician (Obstetrics and Gynecology) Dohmeier, Porfirio Mylar, MD as Consulting Physician (Neurology) Serena Colonel, MD as Consulting Physician (Otolaryngology)  Extended Emergency Contact Information Primary Emergency Contact: [facility] Address: [address]          [place], [place] [address] [place] Home Phone: [phone] Relation: Daughter Secondary Emergency Contact: Chauncey Fischer [place] Home Phone: [phone] Relation: Brother  Code Status:  Full Code  Goals of care: Advanced Directive information Advanced Directives 12/05/2020  Does Patient Have a Medical Advance Directive? No  Type of Advance Directive -  Does patient want to make changes to medical advance directive? -  Copy of Healthcare Power of Attorney in Chart? -  Would patient like information on creating a medical advance directive? No - Patient declined     Chief Complaint  Patient presents with   Acute Visit    Patient complains of possible left ear infection that has developed a rash and has spread to tongue x 4-5 Days.     HPI:  Pt is a 87 y.o. female seen today for an acute visit for evaluation of left ear infection X 4-5 days Facility Nurse at [facility] called in ear drops but has used it all need some more.states still has some pain in the ear and redness surrounding the ear.  Also complains of whitish patches on the tongue.Not on any inhalers. She denies any fever,chills,sore throat or cough    Past Medical History:  Diagnosis Date   Allergy    Aortic stenosis    a. mild to mod by Echo 07/2012   Arthritis    Cataracts, bilateral    Chronic cystitis    Depression     Gait disturbance 06/10/2014   History of bone density study    History of colonoscopy    Hx of cardiovascular stress test    a. ETT-Echo 3/12:  EF 60%, normal study   Hx of echocardiogram    a. Echo 2/14:  Mild LVH, EF 60-65%, Gr 1 diast dysfn, mild to mod AS, mean 17 mmHg, AVA 1.3 (VTI), trivial MR, mild LAE, PASP 31    Hyperlipemia    Hypertension    Hypothyroid    OSA on CPAP    Palpitations    a. event monitor 3/14:  NSR, sinus brady   Plantar fasciitis    S/P cholecystectomy    S/P TAH-BSO    Sleep apnea    Stroke [facility])    tia 2016   TIA (transient ischemic attack)    Past Surgical History:  Procedure Laterality Date   ABDOMINAL HYSTERECTOMY     CHOLECYSTECTOMY     EP IMPLANTABLE DEVICE N/A 06/29/2016   Procedure: Loop Recorder Insertion;  Surgeon: Will Jorja Loa, MD;  Location: [facility];  Service: Cardiovascular;  Laterality: N/A;   FOOT SURGERY  1955   INCONTINENCE SURGERY     LOOP RECORDER REMOVAL N/A 08/10/2017   Procedure: LOOP RECORDER REMOVAL;  Surgeon: Regan Lemming, MD;  Location: [facility];  Service: Cardiovascular;  Laterality: N/A;   TONSILLECTOMY      Allergies  Allergen Reactions   Metronidazole And Related Other (See Comments)  unknown   Bee Venom     Per new patient packet   Ciprofloxacin Other (See Comments)    Reaction unknown   Etodolac Other (See Comments)    Reaction unknown   Naproxen Other (See Comments)    Reaction unknown   Nitrofurantoin Other (See Comments)    Reaction unknown   Sulfonamide Derivatives Other (See Comments)    Reaction unknown   Alprazolam Rash   Epinephrine Palpitations   Macrobid [Nitrofurantoin Macrocrystal] Rash    Outpatient Encounter Medications as of 12/05/2020  Medication Sig   acetic acid-hydrocortisone (VOSOL-HC) OTIC solution Place 5 drops into the left ear 3 (three) times daily for 7 days.   aspirin 81 MG tablet Take 81 mg by mouth daily.   atorvastatin (LIPITOR) 20 MG  tablet Take 20 mg by mouth 3 (three) times a week.   Biotin 5000 MCG CAPS Take 5,000 mcg by mouth daily.    cholecalciferol (VITAMIN D) 1000 units tablet Take 1,000 Units by mouth daily.   escitalopram (LEXAPRO) 10 MG tablet Take 10 mg by mouth at bedtime.   felodipine (PLENDIL) 5 MG 24 hr tablet Take 5 mg by mouth daily.   indapamide (LOZOL) 2.5 MG tablet Take 2.5 mg by mouth daily.   levothyroxine (SYNTHROID, LEVOTHROID) 50 MCG tablet Take 50 mcg by mouth every evening.    methenamine (HIPREX) 1 g tablet Take 1 g by mouth 2 (two) times daily with a meal.   metroNIDAZOLE (METROCREAM) 0.75 % cream Apply 1 application topically 2 (two) times daily as needed (for rosacea).   No facility-administered encounter medications on file as of 12/05/2020.    Review of Systems  Constitutional:  Negative for appetite change, chills, fatigue, fever and unexpected weight change.  HENT:  Positive for ear pain. Negative for congestion, dental problem, ear discharge, facial swelling, hearing loss, nosebleeds, postnasal drip, rhinorrhea, sinus pressure, sinus pain, sneezing, sore throat, tinnitus and trouble swallowing.        Whitish coating on tongue   Eyes:  Negative for pain, discharge, redness, itching and visual disturbance.  Respiratory:  Negative for cough, chest tightness, shortness of breath and wheezing.   Cardiovascular:  Negative for chest pain, palpitations and leg swelling.  Gastrointestinal:  Negative for abdominal distention, abdominal pain, blood in stool, constipation, diarrhea, nausea and vomiting.  Skin:  Positive for color change. Negative for pallor, rash and wound.       Whitish coating on tongue   Neurological:  Negative for dizziness, syncope, speech difficulty, weakness, light-headedness, numbness and headaches.   Immunization History  Administered Date(s) Administered   Moderna SARS-COV2 Booster Vaccination 03/25/2020   Moderna Sars-Covid-2 Vaccination 07/02/2019, 07/16/2019    Tdap 06/15/2015   Pertinent  Health Maintenance Due  Topic Date Due   PNA vac Low Risk Adult (1 of 2 - PCV13) Never done   INFLUENZA VACCINE  01/12/2021   DEXA SCAN  Completed   Fall Risk  12/05/2020 12/03/2020  Falls in the past year? 0 0  Number falls in past yr: 0 0  Injury with Fall? 0 0  Risk for fall due to : No Fall Risks -   Functional Status Survey:    Vitals:   12/05/20 1444  BP: (!) 160/82  Pulse: (!) 53  Resp: 16  Temp: 97.7 F (36.5 C)  SpO2: 92%  Weight: 187 lb (84.8 kg)  Height: 5\' 5"  (1.651 m)   Body mass index is 31.12 kg/m. Physical Exam Vitals reviewed.  Constitutional:  General: She is not in acute distress.    Appearance: Normal appearance. She is normal weight. She is not ill-appearing or diaphoretic.  HENT:     Head: Normocephalic.     Right Ear: Tympanic membrane, ear canal and external ear normal. There is no impacted cerumen.     Left Ear: No decreased hearing noted. No drainage or tenderness. There is no impacted cerumen. Tympanic membrane is erythematous. Tympanic membrane is not bulging.     Nose: Nose normal. No congestion or rhinorrhea.     Mouth/Throat:     Mouth: Mucous membranes are moist.     Pharynx: Oropharynx is clear. No oropharyngeal exudate or posterior oropharyngeal erythema.  Eyes:     General: No scleral icterus.       Right eye: No discharge.        Left eye: No discharge.     Extraocular Movements: Extraocular movements intact.     Conjunctiva/sclera: Conjunctivae normal.     Pupils: Pupils are equal, round, and reactive to light.  Neck:     Vascular: No carotid bruit.  Cardiovascular:     Rate and Rhythm: Normal rate and regular rhythm.     Pulses: Normal pulses.     Heart sounds: Normal heart sounds. No murmur heard.   No friction rub. No gallop.  Pulmonary:     Effort: Pulmonary effort is normal. No respiratory distress.     Breath sounds: Normal breath sounds. No wheezing, rhonchi or rales.  Chest:      Chest wall: No tenderness.  Abdominal:     General: Bowel sounds are normal. There is no distension.     Palpations: Abdomen is soft. There is no mass.     Tenderness: There is no abdominal tenderness. There is no right CVA tenderness, left CVA tenderness, guarding or rebound.  Musculoskeletal:        General: No swelling or tenderness. Normal range of motion.     Cervical back: Normal range of motion. No rigidity or tenderness.     Right lower leg: No edema.     Left lower leg: No edema.  Lymphadenopathy:     Cervical: No cervical adenopathy.  Skin:    General: Skin is warm and dry.     Coloration: Skin is not pale.     Findings: No bruising, lesion or rash.     Comments: Left ear /face area with erythema,warm to touch but non-tender  Neurological:     Mental Status: She is alert and oriented to person, place, and time.     Cranial Nerves: No cranial nerve deficit.     Sensory: No sensory deficit.     Motor: No weakness.     Coordination: Coordination normal.     Gait: Gait normal.  Psychiatric:        Mood and Affect: Mood normal.        Speech: Speech normal.        Behavior: Behavior normal.        Thought Content: Thought content normal.        Judgment: Judgment normal.    Labs reviewed: No results for input(s): NA, K, CL, CO2, GLUCOSE, BUN, CREATININE, CALCIUM, MG, PHOS in the last 8760 hours. No results for input(s): AST, ALT, ALKPHOS, BILITOT, PROT, ALBUMIN in the last 8760 hours. No results for input(s): WBC, NEUTROABS, HGB, HCT, MCV, PLT in the last 8760 hours. Lab Results  Component Value Date   TSH 0.97 08/03/2012  Lab Results  Component Value Date   HGBA1C 6.4 (H) 04/19/2015   Lab Results  Component Value Date   CHOL 218 (H) 04/19/2015   HDL 40 (L) 04/19/2015   LDLCALC 133 (H) 04/19/2015   LDLDIRECT 135.1 08/26/2010   TRIG 224 (H) 04/19/2015   CHOLHDL 5.5 04/19/2015    Significant Diagnostic Results in last 30 days:  No results  found.  Assessment/Plan 1. Cellulitis of left ear Afebrile. Left ear /face area with erythema,warm to touch but non-tender.unclear etiology.erythema extending to left ear canal.  Will start on doxycyline as below  - doxycycline (VIBRA-TABS) 100 MG tablet; Take 1 tablet (100 mg total) by mouth 2 (two) times daily for 10 days.  Dispense: 20 tablet; Refill: 0 - saccharomyces boulardii (FLORASTOR) 250 MG capsule; Take 1 capsule (250 mg total) by mouth 2 (two) times daily for 10 days.  Dispense: 20 capsule; Refill: 0 - acetaminophen (TYLENOL) 500 MG tablet; Take 1 tablet (500 mg total) by mouth every 8 (eight) hours as needed for moderate pain.  Dispense: 30 tablet; Refill: 0 - Notify provider or go to ED if symptoms worse or fail to improve.   2. Thrush, oral Whitish coating on tongue unable to scrap with tongue blade. - advised to swish and swallow Nystain as below.  - nystatin (MYCOSTATIN) 100000 UNIT/ML suspension; Take 5 mLs (500,000 Units total) by mouth 4 (four) times daily.  Dispense: 60 mL; Refill: 0  Family/ staff Communication: Reviewed plan of care with patient verbalized understanding   Labs/tests ordered: None   Next Appointment: As needed if symptoms worsen or fail to improve    Caesar Bookman, NP

## 2020-12-08 ENCOUNTER — Other Ambulatory Visit: Payer: Self-pay

## 2020-12-08 ENCOUNTER — Ambulatory Visit: Payer: Medicare PPO | Admitting: Family

## 2020-12-08 ENCOUNTER — Encounter: Payer: Self-pay | Admitting: Family

## 2020-12-08 VITALS — BP 140/70 | HR 65 | Temp 97.5°F | Resp 16 | Ht 65.0 in | Wt 184.0 lb

## 2020-12-08 DIAGNOSIS — R21 Rash and other nonspecific skin eruption: Secondary | ICD-10-CM

## 2020-12-08 NOTE — Patient Instructions (Signed)
-   continue current antibiotics  - urgent referral ordered today to dermatologist

## 2020-12-08 NOTE — Progress Notes (Signed)
Provider: Lupe Handley FNP-C  Mahlon Gammon, MD  Patient Care Team: Mahlon Gammon, MD as PCP - General (Internal Medicine) Regan Lemming, MD as PCP - Cardiology (Cardiology) Antony Contras, MD as Consulting Physician (Ophthalmology) Edwinna Areola, DO as Consulting Physician (Obstetrics and Gynecology) Dohmeier, Porfirio Mylar, MD as Consulting Physician (Neurology) Serena Colonel, MD as Consulting Physician (Otolaryngology)  Extended Emergency Contact Information Primary Emergency Contact: [facility] Address: [address]          [place], [place] [address] [place] Home Phone: [phone] Relation: Daughter Secondary Emergency Contact: Chauncey Fischer [place] of [place] Home Phone: [phone] Relation: Brother  Code Status:  Full Code  Goals of care: Advanced Directive information Advanced Directives 12/08/2020  Does Patient Have a Medical Advance Directive? No  Type of Advance Directive -  Does patient want to make changes to medical advance directive? -  Copy of Healthcare Power of Attorney in Chart? -  Would patient like information on creating a medical advance directive? No - Patient declined     Chief Complaint  Patient presents with   Follow-up    Complains that sores around ear are worsening and going down face.    HPI:  Pt is a 87 y.o. female seen today for an acute visit for evaluation of sores on left side of face.states redness not itchy or painful.seems to be spreading down to her chin area.Has not seen any drainage.she denies any fever or chills. She was seen recently on 12/05/2020 for left ear cellulitis and treated with Doxycycline.states antibiotics has helped with the pain she had in the ear  Her whitish coating on the tongue has also improved with Nystatin swish solution.    Past Medical History:  Diagnosis Date   Allergy    Aortic stenosis    a. mild to mod by Echo 07/2012   Arthritis    Cataracts, bilateral     Chronic cystitis    Depression    Gait disturbance 06/10/2014   History of bone density study    History of colonoscopy    Hx of cardiovascular stress test    a. ETT-Echo 3/12:  EF 60%, normal study   Hx of echocardiogram    a. Echo 2/14:  Mild LVH, EF 60-65%, Gr 1 diast dysfn, mild to mod AS, mean 17 mmHg, AVA 1.3 (VTI), trivial MR, mild LAE, PASP 31    Hyperlipemia    Hypertension    Hypothyroid    OSA on CPAP    Palpitations    a. event monitor 3/14:  NSR, sinus brady   Plantar fasciitis    S/P cholecystectomy    S/P TAH-BSO    Sleep apnea    Stroke [facility])    tia 2016   TIA (transient ischemic attack)    Past Surgical History:  Procedure Laterality Date   ABDOMINAL HYSTERECTOMY     CHOLECYSTECTOMY     EP IMPLANTABLE DEVICE N/A 06/29/2016   Procedure: Loop Recorder Insertion;  Surgeon: Will Jorja Loa, MD;  Location: [facility];  Service: Cardiovascular;  Laterality: N/A;   FOOT SURGERY  1955   INCONTINENCE SURGERY     LOOP RECORDER REMOVAL N/A 08/10/2017   Procedure: LOOP RECORDER REMOVAL;  Surgeon: Regan Lemming, MD;  Location: [facility];  Service: Cardiovascular;  Laterality: N/A;   TONSILLECTOMY      Allergies  Allergen Reactions   Metronidazole And Related Other (See Comments)  unknown   Bee Venom     Per new patient packet   Ciprofloxacin Other (See Comments)    Reaction unknown   Etodolac Other (See Comments)    Reaction unknown   Naproxen Other (See Comments)    Reaction unknown   Nitrofurantoin Other (See Comments)    Reaction unknown   Sulfonamide Derivatives Other (See Comments)    Reaction unknown   Alprazolam Rash   Epinephrine Palpitations   Macrobid [Nitrofurantoin Macrocrystal] Rash    Outpatient Encounter Medications as of 12/08/2020  Medication Sig   acetaminophen (TYLENOL) 500 MG tablet Take 1 tablet (500 mg total) by mouth every 8 (eight) hours as needed for moderate pain.   acetic acid-hydrocortisone  (VOSOL-HC) OTIC solution Place 5 drops into the left ear 3 (three) times daily for 7 days.   aspirin 81 MG tablet Take 81 mg by mouth daily.   atorvastatin (LIPITOR) 20 MG tablet Take 20 mg by mouth 3 (three) times a week.   Biotin 5000 MCG CAPS Take 5,000 mcg by mouth daily.    cholecalciferol (VITAMIN D) 1000 units tablet Take 1,000 Units by mouth daily.   doxycycline (VIBRA-TABS) 100 MG tablet Take 1 tablet (100 mg total) by mouth 2 (two) times daily for 10 days.   escitalopram (LEXAPRO) 10 MG tablet Take 10 mg by mouth at bedtime.   felodipine (PLENDIL) 5 MG 24 hr tablet Take 5 mg by mouth daily.   indapamide (LOZOL) 2.5 MG tablet Take 2.5 mg by mouth daily.   levothyroxine (SYNTHROID, LEVOTHROID) 50 MCG tablet Take 50 mcg by mouth every evening.    methenamine (HIPREX) 1 g tablet Take 1 g by mouth 2 (two) times daily with a meal.   metroNIDAZOLE (METROCREAM) 0.75 % cream Apply 1 application topically 2 (two) times daily as needed (for rosacea).   nystatin (MYCOSTATIN) 100000 UNIT/ML suspension Take 5 mLs (500,000 Units total) by mouth 4 (four) times daily.   saccharomyces boulardii (FLORASTOR) 250 MG capsule Take 1 capsule (250 mg total) by mouth 2 (two) times daily for 10 days.   No facility-administered encounter medications on file as of 12/08/2020.    Review of Systems  Constitutional:  Negative for appetite change, chills, fatigue and fever.  HENT:  Negative for congestion, ear discharge, ear pain, hearing loss, rhinorrhea, sinus pressure, sinus pain, sneezing and sore throat.   Eyes:  Negative for pain, discharge, redness, itching and visual disturbance.  Respiratory:  Negative for cough, chest tightness, shortness of breath and wheezing.   Skin:  Negative for color change and pallor.       Rash left face per HPI   Neurological:  Negative for dizziness, speech difficulty, light-headedness, numbness and headaches.   Immunization History  Administered Date(s) Administered    Moderna SARS-COV2 Booster Vaccination 03/25/2020   Moderna Sars-Covid-2 Vaccination 07/02/2019, 07/16/2019   Tdap 06/15/2015   Pertinent  Health Maintenance Due  Topic Date Due   PNA vac Low Risk Adult (1 of 2 - PCV13) Never done   INFLUENZA VACCINE  01/12/2021   DEXA SCAN  Completed   Fall Risk  12/08/2020 12/05/2020 12/03/2020  Falls in the past year? 0 0 0  Number falls in past yr: 0 0 0  Injury with Fall? 0 0 0  Risk for fall due to : No Fall Risks No Fall Risks -  Follow up Falls evaluation completed - -   Functional Status Survey:    Vitals:   12/08/20 1546  BP:  140/70  Pulse: 65  Resp: 16  Temp: (!) 97.5 F (36.4 C)  SpO2: 95%  Weight: 184 lb (83.5 kg)  Height: 5\' 5"  (1.651 m)   Body mass index is 30.62 kg/m. Physical Exam Vitals reviewed.  Constitutional:      General: She is not in acute distress.    Appearance: Normal appearance. She is obese. She is not ill-appearing or diaphoretic.  HENT:     Head: Normocephalic.     Right Ear: Tympanic membrane, ear canal and external ear normal. There is no impacted cerumen.     Left Ear: Tympanic membrane, ear canal and external ear normal. There is no impacted cerumen.     Nose: Nose normal. No congestion or rhinorrhea.     Mouth/Throat:     Mouth: Mucous membranes are moist.     Pharynx: Oropharynx is clear. No oropharyngeal exudate or posterior oropharyngeal erythema.  Eyes:     General: No scleral icterus.       Right eye: No discharge.        Left eye: No discharge.     Extraocular Movements: Extraocular movements intact.     Conjunctiva/sclera: Conjunctivae normal.     Pupils: Pupils are equal, round, and reactive to light.  Cardiovascular:     Rate and Rhythm: Normal rate and regular rhythm.     Pulses: Normal pulses.     Heart sounds: Normal heart sounds. No murmur heard.   No friction rub. No gallop.  Pulmonary:     Effort: Pulmonary effort is normal. No respiratory distress.     Breath sounds: Normal  breath sounds. No wheezing, rhonchi or rales.  Chest:     Chest wall: No tenderness.  Abdominal:     General: Bowel sounds are normal. There is no distension.     Palpations: Abdomen is soft. There is no mass.     Tenderness: There is no abdominal tenderness. There is no right CVA tenderness, left CVA tenderness, guarding or rebound.  Musculoskeletal:        General: No swelling or tenderness. Normal range of motion.     Right lower leg: No edema.     Left lower leg: No edema.  Skin:    General: Skin is warm and dry.     Coloration: Skin is not pale.     Findings: No bruising or lesion.     Comments: Left face erythema from ear area spreading towards then chin areas.No vesicles and not warm to touch.crusty scab noted anterior to the ear. Rash verified with tow other providers present in the office today.  Neurological:     Mental Status: She is alert and oriented to person, place, and time.     Cranial Nerves: No cranial nerve deficit.     Sensory: No sensory deficit.     Motor: No weakness.     Coordination: Coordination normal.     Gait: Gait normal.  Psychiatric:        Mood and Affect: Mood normal.        Speech: Speech normal.        Behavior: Behavior normal.        Thought Content: Thought content normal.        Judgment: Judgment normal.    Labs reviewed: No results for input(s): NA, K, CL, CO2, GLUCOSE, BUN, CREATININE, CALCIUM, MG, PHOS in the last 8760 hours. No results for input(s): AST, ALT, ALKPHOS, BILITOT, PROT, ALBUMIN in the last 8760 hours.  No results for input(s): WBC, NEUTROABS, HGB, HCT, MCV, PLT in the last 8760 hours. Lab Results  Component Value Date   TSH 0.97 08/03/2012   Lab Results  Component Value Date   HGBA1C 6.4 (H) 04/19/2015   Lab Results  Component Value Date   CHOL 218 (H) 04/19/2015   HDL 40 (L) 04/19/2015   LDLCALC 133 (H) 04/19/2015   LDLDIRECT 135.1 08/26/2010   TRIG 224 (H) 04/19/2015   CHOLHDL 5.5 04/19/2015     Significant Diagnostic Results in last 30 days:  No results found.  Assessment/Plan  Rash and nonspecific skin eruption Left face erythema from ear area spreading towards then chin areas.No vesicles and not warm to touch.crusty scab noted anterior to the ear. Suspicious for shingles but denied any pain or itching.No vesicles noted.Verified by other two providers present in the office today.Unclear etiology. - encouraged to complete current antibiotics   - will send an urgent referral for dermatologist to evaluate.patient agrees with plan.  - Ambulatory referral to Dermatology  Family/ staff Communication: Reviewed plan of care with patient verbalized understanding.   Labs/tests ordered: None   Next Appointment: As needed if symptoms worsen or fail to improve    Caesar Bookmaninah C Tahirah Sara, NP

## 2020-12-10 DIAGNOSIS — B029 Zoster without complications: Secondary | ICD-10-CM | POA: Diagnosis not present

## 2020-12-12 LAB — T4, FREE: Free T4: 1

## 2020-12-16 ENCOUNTER — Telehealth: Payer: Self-pay

## 2020-12-16 ENCOUNTER — Ambulatory Visit: Payer: Medicare PPO | Admitting: Family Medicine

## 2020-12-16 NOTE — Telephone Encounter (Signed)
Patient called requesting to speak with the director of our office. I informed patient that our practice administrator is unavailable this week and patient reiterated that she does not wish to speak with the practice administrator only the director.  Patient asked who our director is and I shared that information with her. Patient asked if she could have her cell phone number, as she wants to speak to her today. I informed patient that unfortunately I could not disclose that information to her yet I will inform her of her request via email and mark it as high importance.  I asked if I could assist patient with anything and she told me, this matter is none of my business since I am not the director and nothing will please her beyond me forwarding her request.  Message was sent to the director via eamil.

## 2020-12-17 NOTE — Telephone Encounter (Signed)
I spoke with [facility] and she spoke with patient yesterday. Autumn Allen asked that we have Dr.Miller review the note from 12/08/2020 and confirm visit was handled appropriately or if there is any opportunity for growth on how to address concerns presented in the 12/08/2020 visit.

## 2020-12-17 NOTE — Telephone Encounter (Signed)
Dr.Miller's message was sent to the director via email, who plans to follow-up with the patient.

## 2020-12-19 DIAGNOSIS — G4733 Obstructive sleep apnea (adult) (pediatric): Secondary | ICD-10-CM | POA: Diagnosis not present

## 2020-12-23 DIAGNOSIS — R112 Nausea with vomiting, unspecified: Secondary | ICD-10-CM | POA: Diagnosis not present

## 2020-12-23 DIAGNOSIS — R5383 Other fatigue: Secondary | ICD-10-CM | POA: Diagnosis not present

## 2020-12-23 DIAGNOSIS — B0229 Other postherpetic nervous system involvement: Secondary | ICD-10-CM | POA: Diagnosis not present

## 2020-12-31 ENCOUNTER — Encounter: Payer: Medicare PPO | Admitting: Internal Medicine

## 2021-01-18 NOTE — Telephone Encounter (Signed)
Coordination note

## 2021-01-19 DIAGNOSIS — G4733 Obstructive sleep apnea (adult) (pediatric): Secondary | ICD-10-CM | POA: Diagnosis not present

## 2021-01-21 DIAGNOSIS — G4733 Obstructive sleep apnea (adult) (pediatric): Secondary | ICD-10-CM | POA: Diagnosis not present

## 2021-01-21 DIAGNOSIS — R7301 Impaired fasting glucose: Secondary | ICD-10-CM | POA: Diagnosis not present

## 2021-01-21 DIAGNOSIS — R0609 Other forms of dyspnea: Secondary | ICD-10-CM | POA: Diagnosis not present

## 2021-01-21 DIAGNOSIS — B0229 Other postherpetic nervous system involvement: Secondary | ICD-10-CM | POA: Diagnosis not present

## 2021-01-21 DIAGNOSIS — E039 Hypothyroidism, unspecified: Secondary | ICD-10-CM | POA: Diagnosis not present

## 2021-01-21 DIAGNOSIS — I10 Essential (primary) hypertension: Secondary | ICD-10-CM | POA: Diagnosis not present

## 2021-01-21 DIAGNOSIS — I350 Nonrheumatic aortic (valve) stenosis: Secondary | ICD-10-CM | POA: Diagnosis not present

## 2021-01-21 DIAGNOSIS — F329 Major depressive disorder, single episode, unspecified: Secondary | ICD-10-CM | POA: Diagnosis not present

## 2021-01-21 DIAGNOSIS — R011 Cardiac murmur, unspecified: Secondary | ICD-10-CM | POA: Diagnosis not present

## 2021-02-19 DIAGNOSIS — G4733 Obstructive sleep apnea (adult) (pediatric): Secondary | ICD-10-CM | POA: Diagnosis not present

## 2021-03-21 DIAGNOSIS — G4733 Obstructive sleep apnea (adult) (pediatric): Secondary | ICD-10-CM | POA: Diagnosis not present

## 2021-03-31 DIAGNOSIS — G8929 Other chronic pain: Secondary | ICD-10-CM | POA: Diagnosis not present

## 2021-03-31 DIAGNOSIS — I951 Orthostatic hypotension: Secondary | ICD-10-CM | POA: Diagnosis not present

## 2021-03-31 DIAGNOSIS — E785 Hyperlipidemia, unspecified: Secondary | ICD-10-CM | POA: Diagnosis not present

## 2021-03-31 DIAGNOSIS — I10 Essential (primary) hypertension: Secondary | ICD-10-CM | POA: Diagnosis not present

## 2021-03-31 DIAGNOSIS — F324 Major depressive disorder, single episode, in partial remission: Secondary | ICD-10-CM | POA: Diagnosis not present

## 2021-03-31 DIAGNOSIS — F419 Anxiety disorder, unspecified: Secondary | ICD-10-CM | POA: Diagnosis not present

## 2021-03-31 DIAGNOSIS — E669 Obesity, unspecified: Secondary | ICD-10-CM | POA: Diagnosis not present

## 2021-03-31 DIAGNOSIS — I739 Peripheral vascular disease, unspecified: Secondary | ICD-10-CM | POA: Diagnosis not present

## 2021-03-31 DIAGNOSIS — E039 Hypothyroidism, unspecified: Secondary | ICD-10-CM | POA: Diagnosis not present

## 2021-04-21 DIAGNOSIS — G4733 Obstructive sleep apnea (adult) (pediatric): Secondary | ICD-10-CM | POA: Diagnosis not present

## 2021-04-22 ENCOUNTER — Other Ambulatory Visit: Payer: Self-pay | Admitting: Internal Medicine

## 2021-04-22 DIAGNOSIS — E785 Hyperlipidemia, unspecified: Secondary | ICD-10-CM

## 2021-04-22 DIAGNOSIS — I10 Essential (primary) hypertension: Secondary | ICD-10-CM

## 2021-04-22 DIAGNOSIS — E039 Hypothyroidism, unspecified: Secondary | ICD-10-CM

## 2021-04-29 ENCOUNTER — Encounter: Payer: Self-pay | Admitting: Internal Medicine

## 2021-07-01 DIAGNOSIS — I350 Nonrheumatic aortic (valve) stenosis: Secondary | ICD-10-CM | POA: Diagnosis not present

## 2021-07-01 DIAGNOSIS — R011 Cardiac murmur, unspecified: Secondary | ICD-10-CM | POA: Diagnosis not present

## 2021-07-01 DIAGNOSIS — I10 Essential (primary) hypertension: Secondary | ICD-10-CM | POA: Diagnosis not present

## 2021-07-01 DIAGNOSIS — M4302 Spondylolysis, cervical region: Secondary | ICD-10-CM | POA: Diagnosis not present

## 2021-07-01 DIAGNOSIS — R7301 Impaired fasting glucose: Secondary | ICD-10-CM | POA: Diagnosis not present

## 2021-08-11 DIAGNOSIS — I10 Essential (primary) hypertension: Secondary | ICD-10-CM | POA: Diagnosis not present

## 2021-08-11 DIAGNOSIS — R0609 Other forms of dyspnea: Secondary | ICD-10-CM | POA: Diagnosis not present

## 2021-08-11 DIAGNOSIS — N390 Urinary tract infection, site not specified: Secondary | ICD-10-CM | POA: Diagnosis not present

## 2021-08-11 DIAGNOSIS — I350 Nonrheumatic aortic (valve) stenosis: Secondary | ICD-10-CM | POA: Diagnosis not present

## 2021-08-11 DIAGNOSIS — F329 Major depressive disorder, single episode, unspecified: Secondary | ICD-10-CM | POA: Diagnosis not present

## 2021-08-11 DIAGNOSIS — E039 Hypothyroidism, unspecified: Secondary | ICD-10-CM | POA: Diagnosis not present

## 2021-08-11 DIAGNOSIS — R7301 Impaired fasting glucose: Secondary | ICD-10-CM | POA: Diagnosis not present

## 2021-08-14 DIAGNOSIS — E039 Hypothyroidism, unspecified: Secondary | ICD-10-CM | POA: Diagnosis not present

## 2021-08-14 DIAGNOSIS — R7301 Impaired fasting glucose: Secondary | ICD-10-CM | POA: Diagnosis not present

## 2021-08-14 DIAGNOSIS — I10 Essential (primary) hypertension: Secondary | ICD-10-CM | POA: Diagnosis not present

## 2021-08-14 DIAGNOSIS — E785 Hyperlipidemia, unspecified: Secondary | ICD-10-CM | POA: Diagnosis not present

## 2021-09-10 DIAGNOSIS — H6123 Impacted cerumen, bilateral: Secondary | ICD-10-CM | POA: Diagnosis not present

## 2021-09-23 DIAGNOSIS — R5383 Other fatigue: Secondary | ICD-10-CM | POA: Diagnosis not present

## 2021-09-23 DIAGNOSIS — E663 Overweight: Secondary | ICD-10-CM | POA: Diagnosis not present

## 2021-09-23 DIAGNOSIS — Z1331 Encounter for screening for depression: Secondary | ICD-10-CM | POA: Diagnosis not present

## 2021-09-23 DIAGNOSIS — R32 Unspecified urinary incontinence: Secondary | ICD-10-CM | POA: Diagnosis not present

## 2021-09-23 DIAGNOSIS — R7301 Impaired fasting glucose: Secondary | ICD-10-CM | POA: Diagnosis not present

## 2021-09-23 DIAGNOSIS — E785 Hyperlipidemia, unspecified: Secondary | ICD-10-CM | POA: Diagnosis not present

## 2021-09-23 DIAGNOSIS — R82998 Other abnormal findings in urine: Secondary | ICD-10-CM | POA: Diagnosis not present

## 2021-09-23 DIAGNOSIS — Z0000 Encounter for general adult medical examination without abnormal findings: Secondary | ICD-10-CM | POA: Diagnosis not present

## 2021-09-23 DIAGNOSIS — Z1339 Encounter for screening examination for other mental health and behavioral disorders: Secondary | ICD-10-CM | POA: Diagnosis not present

## 2021-09-23 DIAGNOSIS — I10 Essential (primary) hypertension: Secondary | ICD-10-CM | POA: Diagnosis not present

## 2021-09-23 DIAGNOSIS — E039 Hypothyroidism, unspecified: Secondary | ICD-10-CM | POA: Diagnosis not present

## 2021-09-28 DIAGNOSIS — Z1212 Encounter for screening for malignant neoplasm of rectum: Secondary | ICD-10-CM | POA: Diagnosis not present

## 2021-10-12 DIAGNOSIS — I350 Nonrheumatic aortic (valve) stenosis: Secondary | ICD-10-CM | POA: Diagnosis not present

## 2021-10-12 DIAGNOSIS — R7301 Impaired fasting glucose: Secondary | ICD-10-CM | POA: Diagnosis not present

## 2021-10-12 DIAGNOSIS — R600 Localized edema: Secondary | ICD-10-CM | POA: Diagnosis not present

## 2021-10-12 DIAGNOSIS — I10 Essential (primary) hypertension: Secondary | ICD-10-CM | POA: Diagnosis not present

## 2021-11-10 DIAGNOSIS — I10 Essential (primary) hypertension: Secondary | ICD-10-CM | POA: Diagnosis not present

## 2021-11-10 DIAGNOSIS — M545 Low back pain, unspecified: Secondary | ICD-10-CM | POA: Diagnosis not present

## 2021-11-10 DIAGNOSIS — I350 Nonrheumatic aortic (valve) stenosis: Secondary | ICD-10-CM | POA: Diagnosis not present

## 2021-11-10 DIAGNOSIS — W19XXXA Unspecified fall, initial encounter: Secondary | ICD-10-CM | POA: Diagnosis not present

## 2021-11-30 DIAGNOSIS — Z961 Presence of intraocular lens: Secondary | ICD-10-CM | POA: Diagnosis not present

## 2021-11-30 DIAGNOSIS — H532 Diplopia: Secondary | ICD-10-CM | POA: Diagnosis not present

## 2021-11-30 DIAGNOSIS — H524 Presbyopia: Secondary | ICD-10-CM | POA: Diagnosis not present

## 2022-01-12 DIAGNOSIS — G629 Polyneuropathy, unspecified: Secondary | ICD-10-CM | POA: Diagnosis not present

## 2022-01-12 DIAGNOSIS — F419 Anxiety disorder, unspecified: Secondary | ICD-10-CM | POA: Diagnosis not present

## 2022-01-12 DIAGNOSIS — E785 Hyperlipidemia, unspecified: Secondary | ICD-10-CM | POA: Diagnosis not present

## 2022-01-12 DIAGNOSIS — I739 Peripheral vascular disease, unspecified: Secondary | ICD-10-CM | POA: Diagnosis not present

## 2022-01-12 DIAGNOSIS — E669 Obesity, unspecified: Secondary | ICD-10-CM | POA: Diagnosis not present

## 2022-01-12 DIAGNOSIS — E261 Secondary hyperaldosteronism: Secondary | ICD-10-CM | POA: Diagnosis not present

## 2022-01-12 DIAGNOSIS — I951 Orthostatic hypotension: Secondary | ICD-10-CM | POA: Diagnosis not present

## 2022-01-12 DIAGNOSIS — I110 Hypertensive heart disease with heart failure: Secondary | ICD-10-CM | POA: Diagnosis not present

## 2022-01-12 DIAGNOSIS — I509 Heart failure, unspecified: Secondary | ICD-10-CM | POA: Diagnosis not present

## 2022-02-26 IMAGING — MR MR ORBITS W/O CM
4 of 5 series · 15 of 48 positions shown · non-contrast
Comparison: Head CT 06/18/2019, brain MRI 04/18/2015

CLINICAL DATA: Diplopia, closed fracture of orbit with delayed
healing, subsequent encounter. Additional history provided by
scanning technologist: Fall June 2019.

EXAM:
MRI OF THE ORBITS WITHOUT CONTRAST
TECHNIQUE: Multiplanar, multisequence MR imaging of the orbits was performed.
No intravenous contrast was administered.

[Series 5: T1 · sagittal · 4.0mm · 0.75mm/px · 3 of 27 slices shown (1 of 2)]
[im 4/27]
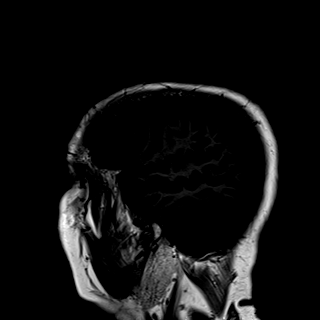
[im 15/27]
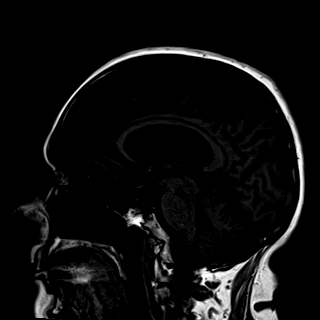
[im 23/27]
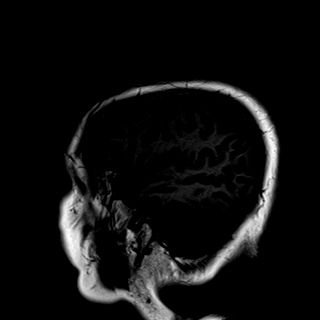

[Series 6: T2 fat-sat · coronal · 3.0mm · 0.25mm/px · 6 of 34 slices shown (1 of 2)]
[im 1/34]
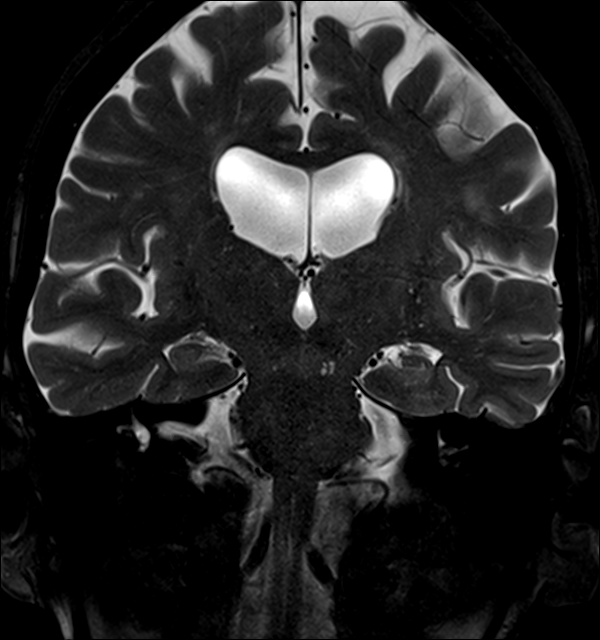
[im 4/34]
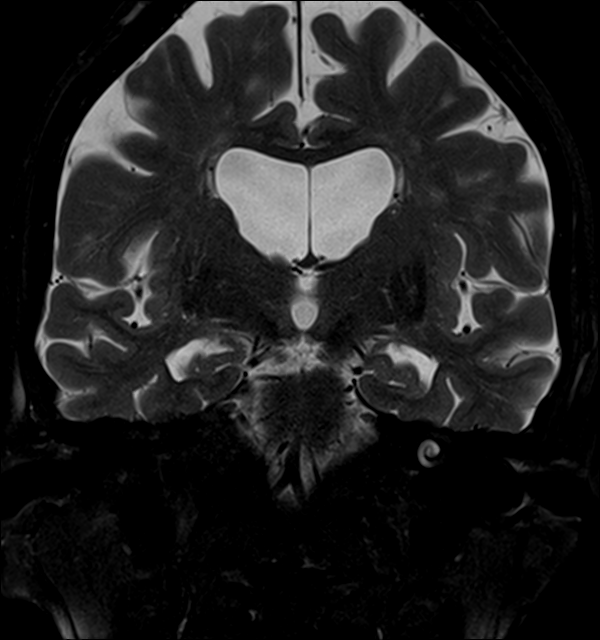
[im 7/34]
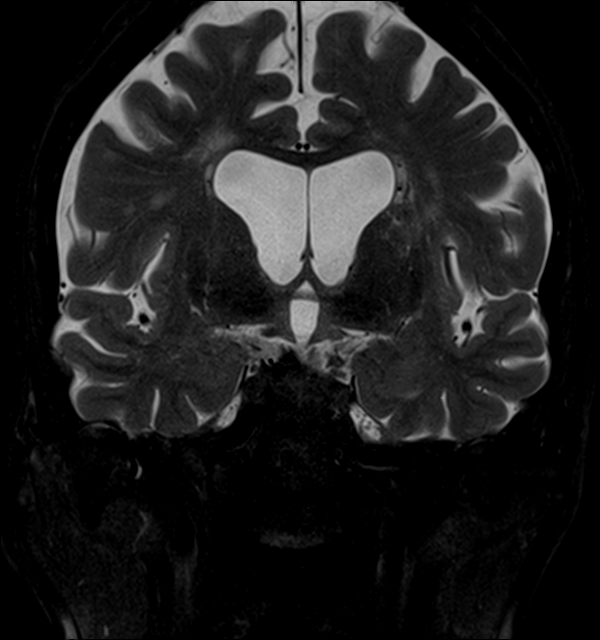
[im 10/34]
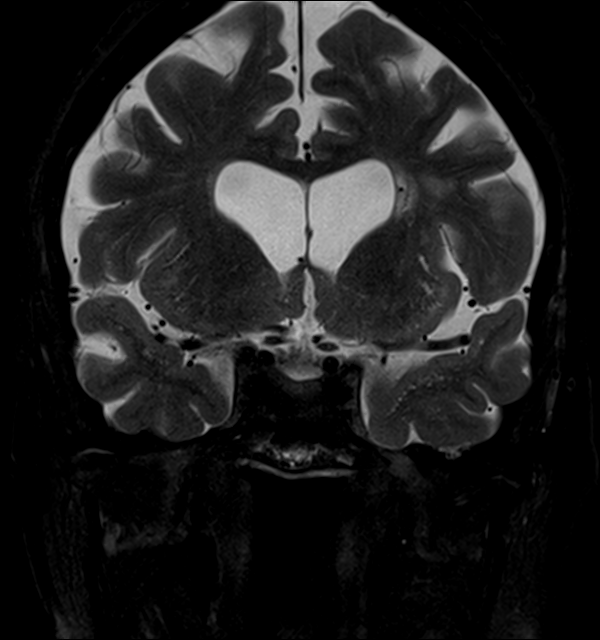
[im 17/34]
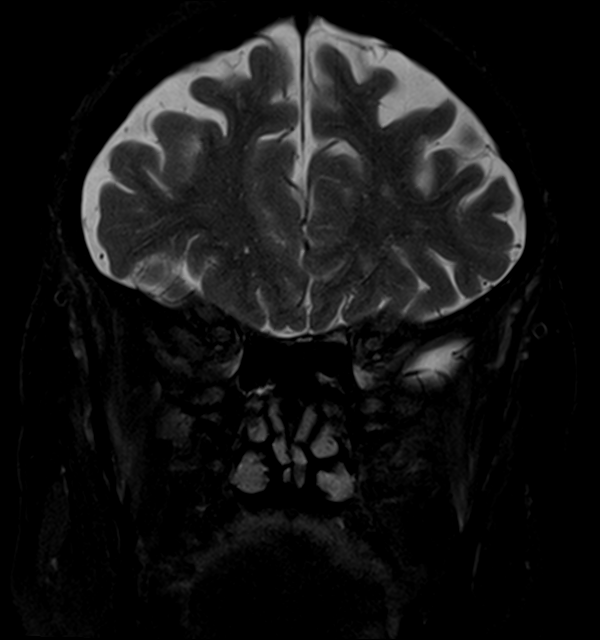
[im 30/34]
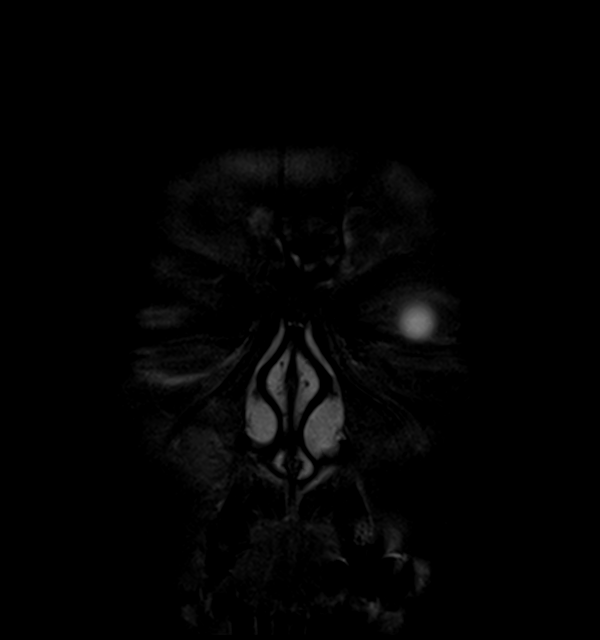

[Series 7: T2 fat-sat · axial · 3.0mm · 0.25mm/px · z∈[-49,+15]mm · 3 of 30 slices shown (2 of 2)]
[im 4/30]
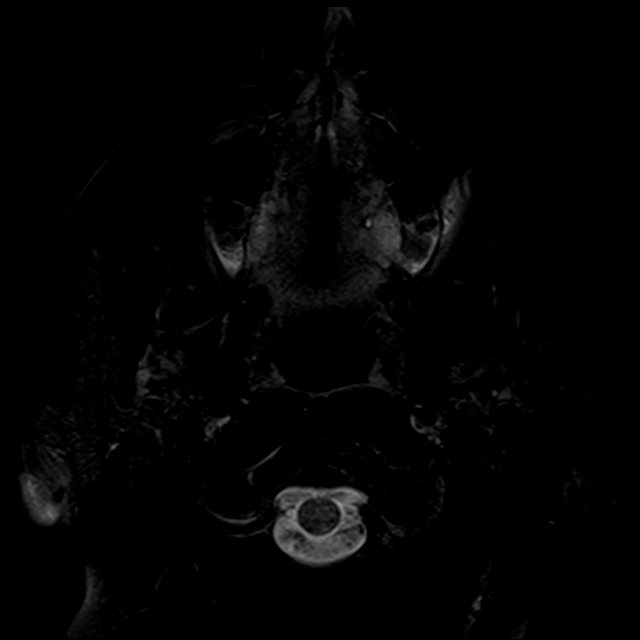
[im 17/30]
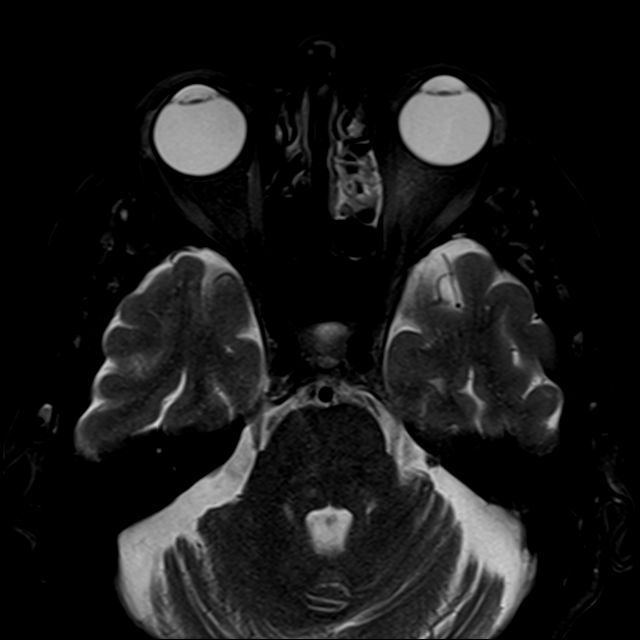
[im 26/30]
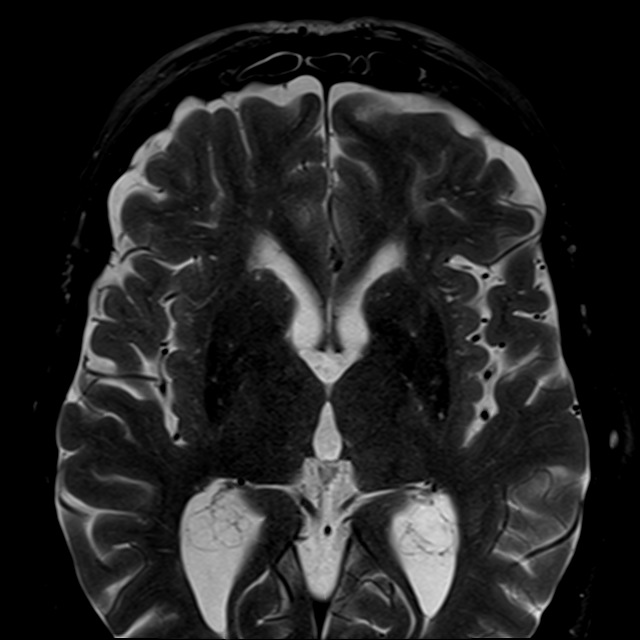

[Series 8: T1 · axial · 3.0mm · 0.25mm/px · z∈[-35,+1]mm · 3 of 20 slices shown (2 of 2)]
[im 4/20]
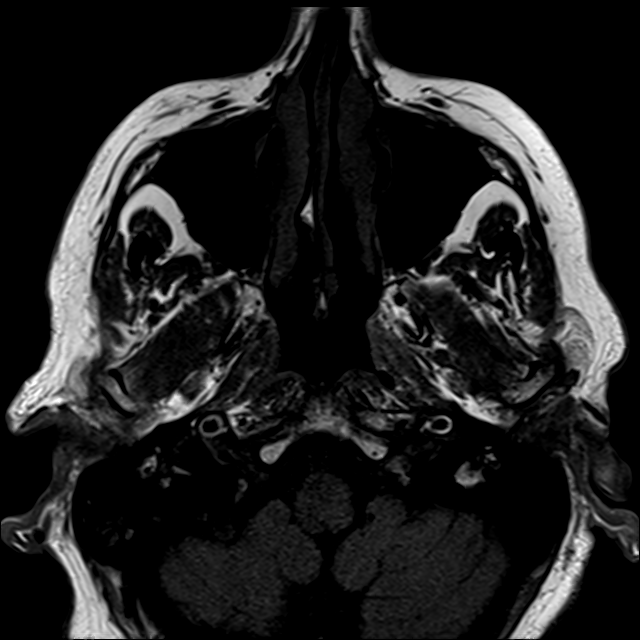
[im 10/20]
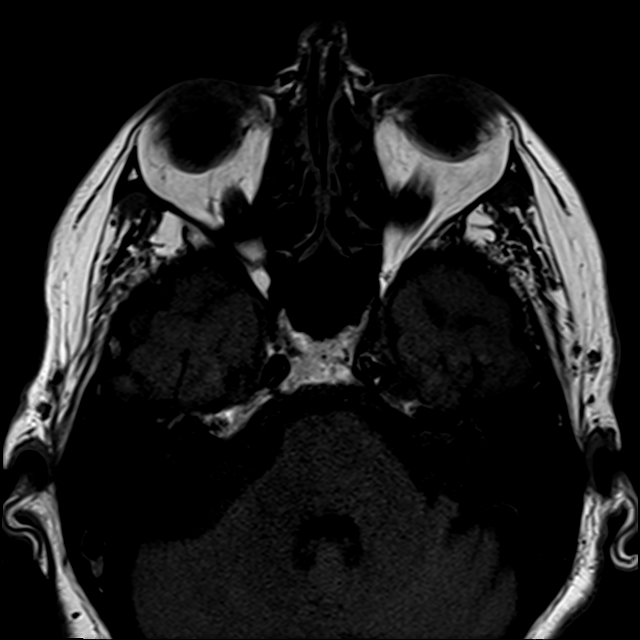
[im 16/20]
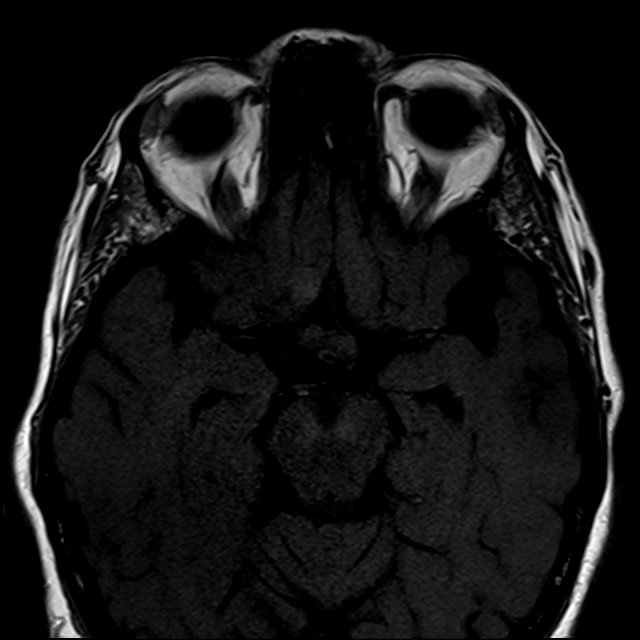

[15 of 48 positions shown; findings below may reference images not displayed]

FINDINGS: Orbits: Depressed fracture of the right orbital floor containing
inferiorly displaced orbital fat, similar in appearance as compared
to the head CT of 06/18/2019. Bilateral lens replacements. The
globes are normal in size and contour. The extraocular muscles and
optic nerve sheath complexes are grossly symmetric. There is no
evidence of intraorbital mass on this non-contrast examination. No
appreciable edema or infiltration of the orbital fat.

Visualized sinuses: Mild frontal sinus mucosal thickening. Scattered
moderate mucosal thickening and secretions within bilateral ethmoid
air cells. Small right mastoid effusion.

Soft tissues: The visualized maxillofacial and upper neck soft
tissues are unremarkable.

Limited intracranial: Generalized parenchymal atrophy of the brain
with chronic small vessel ischemic changes.
IMPRESSION: A depressed fracture of the right orbital floor containing
inferiorly displaced orbital fat is similar in appearance as
compared to the head CT of 06/18/2019. The extraocular muscles are
grossly symmetric.

Paranasal sinus disease. Most notably, there is moderate scattered
mucosal thickening and secretions within the bilateral ethmoid air
cells.

Small right mastoid effusion.

The visualized brain demonstrates stable generalized parenchymal
atrophy and chronic small vessel ischemic changes.

## 2022-03-22 DIAGNOSIS — K649 Unspecified hemorrhoids: Secondary | ICD-10-CM | POA: Diagnosis not present

## 2022-03-22 DIAGNOSIS — R197 Diarrhea, unspecified: Secondary | ICD-10-CM | POA: Diagnosis not present

## 2022-03-31 DIAGNOSIS — R1031 Right lower quadrant pain: Secondary | ICD-10-CM | POA: Diagnosis not present

## 2022-03-31 DIAGNOSIS — R197 Diarrhea, unspecified: Secondary | ICD-10-CM | POA: Diagnosis not present

## 2022-03-31 DIAGNOSIS — N390 Urinary tract infection, site not specified: Secondary | ICD-10-CM | POA: Diagnosis not present

## 2022-04-01 DIAGNOSIS — H6123 Impacted cerumen, bilateral: Secondary | ICD-10-CM | POA: Diagnosis not present

## 2022-04-01 DIAGNOSIS — H903 Sensorineural hearing loss, bilateral: Secondary | ICD-10-CM | POA: Diagnosis not present

## 2022-04-16 ENCOUNTER — Encounter (HOSPITAL_COMMUNITY): Payer: Self-pay

## 2022-04-16 ENCOUNTER — Emergency Department (HOSPITAL_COMMUNITY): Payer: Medicare PPO

## 2022-04-16 ENCOUNTER — Observation Stay (HOSPITAL_COMMUNITY)
Admission: EM | Admit: 2022-04-16 | Discharge: 2022-04-18 | Disposition: A | Discharge status: Home Health | Payer: Medicare PPO | Attending: Internal Medicine | Admitting: Internal Medicine

## 2022-04-16 ENCOUNTER — Observation Stay (HOSPITAL_COMMUNITY): Payer: Medicare PPO

## 2022-04-16 DIAGNOSIS — Z7982 Long term (current) use of aspirin: Secondary | ICD-10-CM | POA: Diagnosis not present

## 2022-04-16 DIAGNOSIS — I350 Nonrheumatic aortic (valve) stenosis: Secondary | ICD-10-CM | POA: Diagnosis not present

## 2022-04-16 DIAGNOSIS — I63512 Cerebral infarction due to unspecified occlusion or stenosis of left middle cerebral artery: Secondary | ICD-10-CM | POA: Diagnosis not present

## 2022-04-16 DIAGNOSIS — G319 Degenerative disease of nervous system, unspecified: Secondary | ICD-10-CM | POA: Diagnosis not present

## 2022-04-16 DIAGNOSIS — I10 Essential (primary) hypertension: Secondary | ICD-10-CM | POA: Diagnosis not present

## 2022-04-16 DIAGNOSIS — I499 Cardiac arrhythmia, unspecified: Secondary | ICD-10-CM | POA: Diagnosis not present

## 2022-04-16 DIAGNOSIS — R6889 Other general symptoms and signs: Secondary | ICD-10-CM | POA: Diagnosis not present

## 2022-04-16 DIAGNOSIS — Z87891 Personal history of nicotine dependence: Secondary | ICD-10-CM | POA: Insufficient documentation

## 2022-04-16 DIAGNOSIS — H903 Sensorineural hearing loss, bilateral: Secondary | ICD-10-CM | POA: Diagnosis not present

## 2022-04-16 DIAGNOSIS — R29818 Other symptoms and signs involving the nervous system: Secondary | ICD-10-CM | POA: Diagnosis not present

## 2022-04-16 DIAGNOSIS — E785 Hyperlipidemia, unspecified: Secondary | ICD-10-CM | POA: Diagnosis present

## 2022-04-16 DIAGNOSIS — R001 Bradycardia, unspecified: Secondary | ICD-10-CM | POA: Diagnosis present

## 2022-04-16 DIAGNOSIS — E039 Hypothyroidism, unspecified: Secondary | ICD-10-CM | POA: Diagnosis present

## 2022-04-16 DIAGNOSIS — G459 Transient cerebral ischemic attack, unspecified: Principal | ICD-10-CM | POA: Diagnosis present

## 2022-04-16 DIAGNOSIS — Z743 Need for continuous supervision: Secondary | ICD-10-CM | POA: Diagnosis not present

## 2022-04-16 DIAGNOSIS — G4733 Obstructive sleep apnea (adult) (pediatric): Secondary | ICD-10-CM | POA: Diagnosis present

## 2022-04-16 DIAGNOSIS — Z79899 Other long term (current) drug therapy: Secondary | ICD-10-CM | POA: Diagnosis not present

## 2022-04-16 DIAGNOSIS — I639 Cerebral infarction, unspecified: Secondary | ICD-10-CM | POA: Diagnosis not present

## 2022-04-16 DIAGNOSIS — R2689 Other abnormalities of gait and mobility: Secondary | ICD-10-CM | POA: Insufficient documentation

## 2022-04-16 DIAGNOSIS — I6782 Cerebral ischemia: Secondary | ICD-10-CM | POA: Diagnosis not present

## 2022-04-16 DIAGNOSIS — R269 Unspecified abnormalities of gait and mobility: Secondary | ICD-10-CM

## 2022-04-16 LAB — COMPREHENSIVE METABOLIC PANEL
ALT: 18 U/L (ref 0–44)
AST: 22 U/L (ref 15–41)
Albumin: 3.8 g/dL (ref 3.5–5.0)
Alkaline Phosphatase: 46 U/L (ref 38–126)
Anion gap: 9 (ref 5–15)
BUN: 17 mg/dL (ref 8–23)
CO2: 30 mmol/L (ref 22–32)
Calcium: 9.5 mg/dL (ref 8.9–10.3)
Chloride: 100 mmol/L (ref 98–111)
Creatinine, Ser: 0.8 mg/dL (ref 0.44–1.00)
GFR, Estimated: >60 mL/min (ref >60)
Glucose, Bld: 98 mg/dL (ref 70–99)
Potassium: 4.2 mmol/L (ref 3.5–5.1)
Sodium: 139 mmol/L (ref 135–145)
Total Bilirubin: 0.7 mg/dL (ref 0.3–1.2)
Total Protein: 6.9 g/dL (ref 6.5–8.1)

## 2022-04-16 LAB — DIFFERENTIAL
Abs Immature Granulocytes: 0.03 10*3/uL (ref 0.00–0.07)
Basophils Absolute: 0.1 10*3/uL (ref 0.0–0.1)
Basophils Relative: 1 %
Eosinophils Absolute: 0.2 10*3/uL (ref 0.0–0.5)
Eosinophils Relative: 3 %
Immature Granulocytes: 0 %
Lymphocytes Relative: 24 %
Lymphs Abs: 2.1 10*3/uL (ref 0.7–4.0)
Monocytes Absolute: 1 10*3/uL (ref 0.1–1.0)
Monocytes Relative: 12 %
Neutro Abs: 5.1 10*3/uL (ref 1.7–7.7)
Neutrophils Relative %: 60 %

## 2022-04-16 LAB — CBC
HCT: 46.4 % — ABNORMAL HIGH (ref 36.0–46.0)
Hemoglobin: 15 g/dL (ref 12.0–15.0)
MCH: 33.1 pg (ref 26.0–34.0)
MCHC: 32.3 g/dL (ref 30.0–36.0)
MCV: 102.4 fL — ABNORMAL HIGH (ref 80.0–100.0)
Platelets: 222 10*3/uL (ref 150–400)
RBC: 4.53 MIL/uL (ref 3.87–5.11)
RDW: 12.2 % (ref 11.5–15.5)
WBC: 8.5 10*3/uL (ref 4.0–10.5)
nRBC: 0 % (ref 0.0–0.2)

## 2022-04-16 LAB — URINALYSIS, ROUTINE W REFLEX MICROSCOPIC
Bacteria, UA: NONE SEEN
Bilirubin Urine: NEGATIVE
Glucose, UA: NEGATIVE mg/dL
Hgb urine dipstick: NEGATIVE
Ketones, ur: NEGATIVE mg/dL
Nitrite: NEGATIVE
Protein, ur: NEGATIVE mg/dL
Specific Gravity, Urine: 1.009 (ref 1.005–1.030)
pH: 6 (ref 5.0–8.0)

## 2022-04-16 LAB — PROTIME-INR
INR: 1 (ref 0.8–1.2)
Prothrombin Time: 13.3 seconds (ref 11.4–15.2)

## 2022-04-16 LAB — ETHANOL: Alcohol, Ethyl (B): <10 mg/dL (ref <10)

## 2022-04-16 LAB — APTT: aPTT: 26 seconds (ref 24–36)

## 2022-04-16 MED ORDER — CLOPIDOGREL BISULFATE 75 MG PO TABS
75.0000 mg | ORAL_TABLET | Freq: Every day | ORAL | Status: DC
  Administered 2022-04-17 – 2022-04-18 (×2): 75 mg via ORAL
  Filled 2022-04-16 (×2): qty 1

## 2022-04-16 MED ORDER — ASPIRIN 81 MG PO TBEC
81.0000 mg | DELAYED_RELEASE_TABLET | Freq: Every day | ORAL | Status: DC
  Administered 2022-04-17 – 2022-04-18 (×2): 81 mg via ORAL
  Filled 2022-04-16 (×2): qty 1

## 2022-04-16 MED ORDER — SODIUM CHLORIDE 0.9 % IV BOLUS
500.0000 mL | Freq: Once | INTRAVENOUS | Status: AC
  Administered 2022-04-16: 500 mL via INTRAVENOUS

## 2022-04-16 MED ORDER — LEVOTHYROXINE SODIUM 50 MCG PO TABS
50.0000 ug | ORAL_TABLET | Freq: Every evening | ORAL | Status: DC
  Administered 2022-04-17 (×2): 50 ug via ORAL
  Filled 2022-04-16 (×2): qty 1

## 2022-04-16 MED ORDER — ACETAMINOPHEN 325 MG PO TABS: 650.0000 mg | ORAL_TABLET | ORAL | Status: DC | PRN

## 2022-04-16 MED ORDER — ESCITALOPRAM OXALATE 10 MG PO TABS
10.0000 mg | ORAL_TABLET | Freq: Every day | ORAL | Status: DC
  Administered 2022-04-17 (×2): 10 mg via ORAL
  Filled 2022-04-16 (×2): qty 1

## 2022-04-16 MED ORDER — ACETAMINOPHEN 500 MG PO TABS: 500.0000 mg | ORAL_TABLET | Freq: Three times a day (TID) | ORAL | Status: DC | PRN

## 2022-04-16 MED ORDER — SODIUM CHLORIDE 0.9 % IV SOLN
100.0000 mL/h | INTRAVENOUS | Status: DC
  Administered 2022-04-16: 100 mL/h via INTRAVENOUS

## 2022-04-16 MED ORDER — VITAMIN D 25 MCG (1000 UNIT) PO TABS
1000.0000 [IU] | ORAL_TABLET | Freq: Every day | ORAL | Status: DC
  Administered 2022-04-17 – 2022-04-18 (×2): 1000 [IU] via ORAL
  Filled 2022-04-16 (×2): qty 1

## 2022-04-16 MED ORDER — ENOXAPARIN SODIUM 40 MG/0.4ML IJ SOSY
40.0000 mg | PREFILLED_SYRINGE | INTRAMUSCULAR | Status: DC
  Administered 2022-04-17 (×2): 40 mg via SUBCUTANEOUS
  Filled 2022-04-16 (×2): qty 0.4

## 2022-04-16 MED ORDER — STROKE: EARLY STAGES OF RECOVERY BOOK
Freq: Once | Status: AC
  Filled 2022-04-16: qty 1

## 2022-04-16 MED ORDER — SODIUM CHLORIDE 0.9 % IV SOLN: INTRAVENOUS | Status: DC

## 2022-04-16 MED ORDER — INDAPAMIDE 2.5 MG PO TABS
2.5000 mg | ORAL_TABLET | Freq: Every day | ORAL | Status: DC
  Filled 2022-04-16: qty 1

## 2022-04-16 MED ORDER — METHENAMINE MANDELATE 1 G PO TABS
1.0000 g | ORAL_TABLET | Freq: Three times a day (TID) | ORAL | Status: DC
  Filled 2022-04-16 (×2): qty 1

## 2022-04-16 MED ORDER — FELODIPINE ER 5 MG PO TB24
5.0000 mg | ORAL_TABLET | Freq: Every day | ORAL | Status: DC
  Filled 2022-04-16: qty 1

## 2022-04-16 MED ORDER — ACETAMINOPHEN 160 MG/5ML PO SOLN: 650.0000 mg | ORAL | Status: DC | PRN

## 2022-04-16 MED ORDER — SENNOSIDES-DOCUSATE SODIUM 8.6-50 MG PO TABS: 1.0000 | ORAL_TABLET | Freq: Every evening | ORAL | Status: DC | PRN

## 2022-04-16 MED ORDER — ESTROGENS, CONJUGATED 0.625 MG/GM VA CREA: 1.0000 | TOPICAL_CREAM | Freq: Every day | VAGINAL | Status: DC

## 2022-04-16 MED ORDER — ACETAMINOPHEN 650 MG RE SUPP: 650.0000 mg | RECTAL | Status: DC | PRN

## 2022-04-16 NOTE — ED Triage Notes (Signed)
Patient arrived to the ED via EMS. Patient lives at [facility]. Patient states she wanted to call her friend and all of sudden could not remember how to use the phone so she went to the nurse at her facility. EMS stated that she passed stroke screening and has not had confusion since. Main complaint of bradycardia. Patient states she does not know of this being a previous problem. Patient is A&O x4.

## 2022-04-16 NOTE — H&P (Signed)
History and Physical    Patient: Autumn Allen VHQ:[id number] DOB: 1929/12/31 DOA: 04/16/2022 DOS: the patient was seen and examined on 04/16/2022 PCP: Burnard Bunting, MD  Patient coming from: Home  Chief Complaint:  Chief Complaint  Patient presents with   Bradycardia   HPI: Autumn Allen is a 88 y.o. female with medical history significant of previous TIA, hypothyroidism, benign positional vertigo, hearing loss, depression, essential hypertension and hyperlipidemia who presented to the ER with transient loss of memory where she was trying to the telephone and suddenly she could not remember how to use her phone.  She is in an independent living facility so she went to the nurse who called EMS and she was brought here.  No notable focal weakness.  No speech changes.  Symptoms have since resolved.  Patient has had previous TIA and has risk factors for CVA including essential hypertension and hyperlipidemia.  We will admit the patient for observation and work-up for TIA.  Review of Systems: As mentioned in the history of present illness. All other systems reviewed and are negative. Past Medical History:  Diagnosis Date   Allergy    Aortic stenosis    a. mild to mod by Echo 07/2012   Arthritis    Asymptomatic varicose veins    Benign paroxysmal vertigo    Bradycardia, unspecified    Bronchitis, not specified as acute or chronic    Cardiac murmur    Cataracts, bilateral    Chest pain, unspecified    Chronic cystitis    Depression    Diarrhea    Dyspnea    Dysuria    Frequency of micturition    Frequent urination    Gait disturbance 06/10/2014   Gastric ulcer    Hearing loss    Hemorrhoids    History of bone density study    History of colonoscopy    History of frequent urinary tract infections    History of urinary tract infection    Hx of cardiovascular stress test    a. ETT-Echo 3/12:  EF 60%, normal study   Hx of echocardiogram    a. Echo 2/14:  Mild LVH, EF  60-65%, Gr 1 diast dysfn, mild to mod AS, mean 17 mmHg, AVA 1.3 (VTI), trivial MR, mild LAE, PASP 31    Hyperlipemia    Hypertension    Hypothyroid    Left knee pain    Leg pain    Low back pain    Obstructive sleep apnea    OSA on CPAP    Other disorders of intestinal carbohydrate absorption    Other type of osteoarthritis, unspecified site    Overweight    Pain in thoracic spine    Palpitations    a. event monitor 3/14:  NSR, sinus brady   Plantar fasciitis    Plantar fasciitis    Right shoulder pain    S/P cholecystectomy    S/P TAH-BSO    Sleep apnea    Stroke ([facility])    tia 2016   TIA (transient ischemic attack)    Varicose veins of unspecified lower extremity with inflammation    Past Surgical History:  Procedure Laterality Date   ABDOMINAL HYSTERECTOMY     CHOLECYSTECTOMY     EP IMPLANTABLE DEVICE N/A 06/29/2016   Procedure: Loop Recorder Insertion;  Surgeon: Will Meredith Leeds, MD;  Location: [place] [facility];  Service: Cardiovascular;  Laterality: N/A;   Fall[place]  LOOP RECORDER REMOVAL N/A 08/10/2017   Procedure: LOOP RECORDER REMOVAL;  Surgeon: Constance Haw, MD;  Location: [place] [facility];  Service: Cardiovascular;  Laterality: N/A;   TONSILLECTOMY     Social History:  reports that she has quit smoking. Her smoking use included cigarettes. She has never used smokeless tobacco. She reports current alcohol use of about 1.0 standard drink of alcohol per week. She reports that she does not use drugs.  Allergies  Allergen Reactions   Metronidazole And Related Other (See Comments)    unknown   Bee Venom     Per new patient packet   Ciprofloxacin Other (See Comments)    Reaction unknown   Crestor [Rosuvastatin]    Etodolac Other (See Comments)    Reaction unknown   Iodine    Naproxen Other (See Comments)    Reaction unknown   Nitrofurantoin Other (See Comments)    Reaction unknown   Sulfonamide Derivatives Other  (See Comments)    Reaction unknown   Alprazolam Rash   Epinephrine Palpitations   Macrobid [Nitrofurantoin Macrocrystal] Rash    Family History  Problem Relation Age of Onset   Heart attack Mother 53   Heart failure Mother    Arthritis Mother    Lung disease Father    Cancer - Prostate Father    Healthy Brother    Healthy Brother    Heart failure Maternal Uncle    Cancer Son     Prior to Admission medications   Medication Sig Start Date End Date Taking? Authorizing Provider  acetaminophen (TYLENOL) 500 MG tablet Take 1 tablet (500 mg total) by mouth every 8 (eight) hours as needed for moderate pain. 12/05/20   Ngetich, Dinah C, NP  aspirin 81 MG tablet Take 81 mg by mouth daily.    [provider]  atorvastatin (LIPITOR) 20 MG tablet Take 20 mg by mouth 3 (three) times a week.    [provider]  Biotin 5000 MCG CAPS Take 5,000 mcg by mouth daily.     [provider]  cholecalciferol (VITAMIN D) 1000 units tablet Take 1,000 Units by mouth daily.    [provider]  conjugated estrogens (PREMARIN) vaginal cream Place 1 Applicatorful vaginally. Twice a week.    [provider]  escitalopram (LEXAPRO) 10 MG tablet Take 10 mg by mouth at bedtime.    [provider]  felodipine (PLENDIL) 5 MG 24 hr tablet Take 5 mg by mouth daily.    [provider]  indapamide (LOZOL) 2.5 MG tablet Take 2.5 mg by mouth daily.    [provider]  levothyroxine (SYNTHROID, LEVOTHROID) 50 MCG tablet Take 50 mcg by mouth every evening.     [provider]  methenamine (HIPREX) 1 g tablet Take 1 g by mouth 2 (two) times daily with a meal.    [provider]  metroNIDAZOLE (METROCREAM) 0.75 % cream Apply 1 application topically 2 (two) times daily as needed (for rosacea).    [provider]  nystatin (MYCOSTATIN) 100000 UNIT/ML suspension Take 5 mLs (500,000 Units total) by mouth 4 (four) times daily. 12/05/20    Ngetich, Nelda Bucks, NP    Physical Exam: Vitals:   04/16/22 2030 04/16/22 2045 04/16/22 2100 04/16/22 2117  BP:      Pulse: (!) 41 (!) 50 (!) 51   Resp: 16 18 16    Temp:    97.8 F (36.6 C)  TempSrc:    Oral  SpO2: 94%  96% 95%   Weight:      Height:       Constitutional: NAD, calm, comfortable Eyes: PERRL, lids and conjunctivae normal ENMT: Mucous membranes are moist. Posterior pharynx clear of any exudate or lesions.Normal dentition.  Neck: normal, supple, no masses, no thyromegaly Respiratory: clear to auscultation bilaterally, no wheezing, no crackles. Normal respiratory effort. No accessory muscle use.  Cardiovascular: Regular rate and rhythm, no murmurs / rubs / gallops. No extremity edema. 2+ pedal pulses. No carotid bruits.  Abdomen: no tenderness, no masses palpated. No hepatosplenomegaly. Bowel sounds positive.  Musculoskeletal: Good range of motion, no joint swelling or tenderness, Skin: no rashes, lesions, ulcers. No induration Neurologic: CN 2-12 grossly intact. Sensation intact, DTR normal. Strength 5/5 in all 4.  Psychiatric: Normal judgment and insight. Alert and oriented x 3. Normal mood  Data Reviewed:  Chemistry all within normal.  MCV is 102.4, EKG showed bradycardia urinalysis essentially negative.  Alcohol level less than 10.  Head CT without contrast is negative  Assessment and Plan:   #1 TIA: Will be admitted for observation. Will add Statin and Plavix to the aspirin.  Patient is 20 mg of atorvastatin but will change it to 40 mg.  PT and OT will consult.  Carotid Dopplers, echocardiogram and MRI of the brain will be ordered.  #2 essential hypertension: Continue blood pressure medications.  #3 hyperlipidemia: Continue with statin  #4 bipolar disorder: Continue home regimen.  #5 hypothyroidism: Continue levothyroxine  #6 sensorineural hearing loss: Patient uses hearing aid.    Advance Care Planning:   Code Status: Prior full code  Consults:  None  Family Communication: No family at bedside  Severity of Illness: The appropriate patient status for this patient is OBSERVATION. Observation status is judged to be reasonable and necessary in order to provide the required intensity of service to ensure the patient's safety. The patient's presenting symptoms, physical exam findings, and initial radiographic and laboratory data in the context of their medical condition is felt to place them at decreased risk for further clinical deterioration. Furthermore, it is anticipated that the patient will be medically stable for discharge from the hospital within 2 midnights of admission.   AuthorBarbette Merino, MD 04/16/2022 9:47 PM  For on call review [email or web address].

## 2022-04-16 NOTE — ED Notes (Signed)
Patient transported to MRI 

## 2022-04-16 NOTE — ED Notes (Signed)
Patient ambulated to bathroom with help.

## 2022-04-16 NOTE — ED Provider Notes (Signed)
[facility] EMERGENCY DEPARTMENT Provider Note   CSN: [id number] Arrival date & time: 04/16/22  1651     History  Chief Complaint  Patient presents with   Bradycardia    Autumn Allen is a 88 y.o. female.  HPI   Patient has history of hypertension, hypothyroidism, hyperlipidemia, depression, stroke who presents to the ED for an episode of confusion.  Patient states she was going to use the telephone and then suddenly she could not remember how to use a phone.  Patient went to the nurse at her living facility.  They called EMS.  Symptoms have resolved.  Patient is not having any confusion now.  She denies any trouble with her speech.  No acute weakness.  No headache.  No fevers or chills.  Home Medications Prior to Admission medications   Medication Sig Start Date End Date Taking? Authorizing Provider  acetaminophen (TYLENOL) 500 MG tablet Take 1 tablet (500 mg total) by mouth every 8 (eight) hours as needed for moderate pain. 12/05/20   Ngetich, Dinah C, NP  aspirin 81 MG tablet Take 81 mg by mouth daily.    [provider]  atorvastatin (LIPITOR) 20 MG tablet Take 20 mg by mouth 3 (three) times a week.    [provider]  Biotin 5000 MCG CAPS Take 5,000 mcg by mouth daily.     [provider]  cholecalciferol (VITAMIN D) 1000 units tablet Take 1,000 Units by mouth daily.    [provider]  conjugated estrogens (PREMARIN) vaginal cream Place 1 Applicatorful vaginally. Twice a week.    [provider]  escitalopram (LEXAPRO) 10 MG tablet Take 10 mg by mouth at bedtime.    [provider]  felodipine (PLENDIL) 5 MG 24 hr tablet Take 5 mg by mouth daily.    [provider]  indapamide (LOZOL) 2.5 MG tablet Take 2.5 mg by mouth daily.    [provider]  levothyroxine (SYNTHROID, LEVOTHROID) 50 MCG tablet Take 50 mcg by mouth every evening.     [provider]  methenamine (HIPREX) 1  g tablet Take 1 g by mouth 2 (two) times daily with a meal.    [provider]  metroNIDAZOLE (METROCREAM) 0.75 % cream Apply 1 application topically 2 (two) times daily as needed (for rosacea).    [provider]  nystatin (MYCOSTATIN) 100000 UNIT/ML suspension Take 5 mLs (500,000 Units total) by mouth 4 (four) times daily. 12/05/20   Ngetich, Dinah C, NP      Allergies    Metronidazole and related, Bee venom, Ciprofloxacin, Crestor [rosuvastatin], Etodolac, Iodine, Naproxen, Nitrofurantoin, Sulfonamide derivatives, Alprazolam, Epinephrine, and Macrobid [nitrofurantoin macrocrystal]    Review of Systems   Review of Systems  Physical Exam Updated Vital Signs BP (!) 167/48   Pulse (!) 51   Temp 97.8 F (36.6 C) (Oral)   Resp 16   Ht 1.651 m (5\' 5" )   Wt 81.6 kg   SpO2 95%   BMI 29.95 kg/m  Physical Exam Vitals and nursing note reviewed.  Constitutional:      General: She is not in acute distress.    Appearance: She is well-developed.  HENT:     Head: Normocephalic and atraumatic.     Right Ear: External ear normal.     Left Ear: External ear normal.  Eyes:     General: No visual field deficit or scleral icterus.       Right eye: No discharge.  Left eye: No discharge.     Conjunctiva/sclera: Conjunctivae normal.  Neck:     Trachea: No tracheal deviation.  Cardiovascular:     Rate and Rhythm: Normal rate and regular rhythm.  Pulmonary:     Effort: Pulmonary effort is normal. No respiratory distress.     Breath sounds: Normal breath sounds. No stridor. No wheezing or rales.  Abdominal:     General: Bowel sounds are normal. There is no distension.     Palpations: Abdomen is soft.     Tenderness: There is no abdominal tenderness. There is no guarding or rebound.  Musculoskeletal:        General: No tenderness.     Cervical back: Neck supple.  Skin:    General: Skin is warm and dry.     Findings: No rash.  Neurological:     Mental Status: She is  alert and oriented to person, place, and time.     Cranial Nerves: No cranial nerve deficit, dysarthria or facial asymmetry.     Sensory: No sensory deficit.     Motor: No abnormal muscle tone, seizure activity or pronator drift.     Coordination: Coordination normal.     Comments:  able to hold both legs off bed for 5 seconds, sensation intact in all extremities,  no left or right sided neglect, normal finger-nose exam bilaterally, no nystagmus noted   Psychiatric:        Mood and Affect: Mood normal.     ED Results / Procedures / Treatments   Labs (all labs ordered are listed, but only abnormal results are displayed) Labs Reviewed  CBC - Abnormal; Notable for the following components:      Result Value   HCT 46.4 (*)    MCV 102.4 (*)    All other components within normal limits  URINALYSIS, ROUTINE W REFLEX MICROSCOPIC - Abnormal; Notable for the following components:   Color, Urine STRAW (*)    Leukocytes,Ua TRACE (*)    All other components within normal limits  PROTIME-INR  APTT  DIFFERENTIAL  COMPREHENSIVE METABOLIC PANEL  ETHANOL    EKG EKG Interpretation  Date/Time:  Friday April 16 2022 17:00:54 EDT Ventricular Rate:  50 PR Interval:  172 QRS Duration: 93 QT Interval:  462 QTC Calculation: 422 R Axis:   68 Text Interpretation: Sinus rhythm Atrial premature complex Confirmed by Linwood Dibbles [phone]) on 04/16/2022 5:03:28 PM  Radiology CT HEAD WO CONTRAST ( )  Result Date: 04/16/2022 CLINICAL DATA:  Neuro deficit, acute, stroke suspected EXAM: CT HEAD WITHOUT CONTRAST TECHNIQUE: Contiguous axial images were obtained from the base of the skull through the vertex without intravenous contrast. RADIATION DOSE REDUCTION: This exam was performed according to the departmental dose-optimization program which includes automated exposure control, adjustment of the mA and/or kV according to patient size and/or use of iterative reconstruction technique. COMPARISON:   06/18/2019 FINDINGS: Brain: No intracranial hemorrhage, mass effect, or midline shift. Generalized atrophy is normal for age. No hydrocephalus. The basilar cisterns are patent. Mild periventricular and deep white matter hypodensity consistent with chronic small vessel ischemia, unchanged. No evidence of territorial infarct or acute ischemia. No extra-axial or intracranial fluid collection. Vascular: No hyperdense vessel or unexpected calcification. Skull: No fracture or focal lesion. Sinuses/Orbits: Mild mucosal thickening of ethmoid air cells. Remote right orbital fracture. Bilateral cataract resection. Other: None. IMPRESSION: 1. No acute intracranial abnormality. 2. Unchanged age related atrophy and mild chronic small vessel ischemia. Electronically Signed   By: Shawna Orleans  Sanford M.D.   On: 04/16/2022 19:40    Procedures Procedures    Medications Ordered in ED Medications  sodium chloride 0.9 % bolus 500 mL (0 mLs Intravenous Stopped 04/16/22 2044)    Followed by  0.9 %  sodium chloride infusion (100 mL/hr Intravenous New Bag/Given 04/16/22 1851)    ED Course/ Medical Decision Making/ A&P Clinical Course as of 04/16/22 2139  Fri Apr 16, 2022  1955 Head CT without acute changes [JK]  1955 Comprehensive metabolic panel Metabolic panel unremarkable [JK]  1955 CBC(!) CBC normal [JK]  1955 Ethanol [JK]  2137 Case discussed with Dr. Malen Gauze.  Agrees with admission for TIA work-up.  Discussed case with Dr. Jonelle Sidle regarding admission [JK]    Clinical Course User Index [JK] Dorie Rank, MD     ABCD2 Score: 4                     Medical Decision Making Problems Addressed: TIA (transient ischemic attack): acute illness or injury that poses a threat to life or bodily functions  Amount and/or Complexity of Data Reviewed Labs: ordered. Decision-making details documented in ED Course. Radiology: ordered and independent interpretation performed.  Risk Prescription drug management. Decision  regarding hospitalization.   Patient presented with symptoms concerning for TIA, stroke.  Patient had an episode of difficulty with speech can figure out how to use a phone.  Symptoms have resolved.  Neurologic exam reassuring at this time.  ABCD2 score is 4.  Case discussed with neurology and the medical service regarding admission for further work-up and evaluation.        Final Clinical Impression(s) / ED Diagnoses Final diagnoses:  TIA (transient ischemic attack)    Rx / DC Orders ED Discharge Orders     None         Dorie Rank, MD 04/16/22 2139

## 2022-04-16 NOTE — ED Notes (Signed)
Pt returned from MRI °

## 2022-04-16 NOTE — ED Notes (Signed)
Report given to Alison, RN

## 2022-04-17 ENCOUNTER — Observation Stay (HOSPITAL_COMMUNITY): Payer: Medicare PPO

## 2022-04-17 ENCOUNTER — Observation Stay (HOSPITAL_BASED_OUTPATIENT_CLINIC_OR_DEPARTMENT_OTHER): Payer: Medicare PPO

## 2022-04-17 ENCOUNTER — Other Ambulatory Visit: Payer: Self-pay

## 2022-04-17 DIAGNOSIS — I6389 Other cerebral infarction: Secondary | ICD-10-CM

## 2022-04-17 DIAGNOSIS — I63512 Cerebral infarction due to unspecified occlusion or stenosis of left middle cerebral artery: Secondary | ICD-10-CM | POA: Diagnosis not present

## 2022-04-17 DIAGNOSIS — G459 Transient cerebral ischemic attack, unspecified: Secondary | ICD-10-CM | POA: Diagnosis not present

## 2022-04-17 DIAGNOSIS — I63233 Cerebral infarction due to unspecified occlusion or stenosis of bilateral carotid arteries: Secondary | ICD-10-CM | POA: Diagnosis not present

## 2022-04-17 DIAGNOSIS — I639 Cerebral infarction, unspecified: Secondary | ICD-10-CM | POA: Diagnosis not present

## 2022-04-17 DIAGNOSIS — R4182 Altered mental status, unspecified: Secondary | ICD-10-CM | POA: Diagnosis not present

## 2022-04-17 DIAGNOSIS — R001 Bradycardia, unspecified: Secondary | ICD-10-CM

## 2022-04-17 DIAGNOSIS — I350 Nonrheumatic aortic (valve) stenosis: Secondary | ICD-10-CM | POA: Diagnosis not present

## 2022-04-17 LAB — LIPID PANEL
Cholesterol: 181 mg/dL (ref 0–200)
HDL: 47 mg/dL (ref >40)
LDL Cholesterol: 115 mg/dL — ABNORMAL HIGH (ref 0–99)
Total CHOL/HDL Ratio: 3.9 RATIO
Triglycerides: 94 mg/dL (ref <150)
VLDL: 19 mg/dL (ref 0–40)

## 2022-04-17 LAB — TSH: TSH: 1.304 u[IU]/mL (ref 0.350–4.500)

## 2022-04-17 LAB — CBC
HCT: 44.7 % (ref 36.0–46.0)
Hemoglobin: 14.9 g/dL (ref 12.0–15.0)
MCH: 33.2 pg (ref 26.0–34.0)
MCHC: 33.3 g/dL (ref 30.0–36.0)
MCV: 99.6 fL (ref 80.0–100.0)
Platelets: 207 10*3/uL (ref 150–400)
RBC: 4.49 MIL/uL (ref 3.87–5.11)
RDW: 11.9 % (ref 11.5–15.5)
WBC: 5.7 10*3/uL (ref 4.0–10.5)
nRBC: 0 % (ref 0.0–0.2)

## 2022-04-17 LAB — BASIC METABOLIC PANEL
Anion gap: 12 (ref 5–15)
BUN: 15 mg/dL (ref 8–23)
CO2: 25 mmol/L (ref 22–32)
Calcium: 8.9 mg/dL (ref 8.9–10.3)
Chloride: 101 mmol/L (ref 98–111)
Creatinine, Ser: 0.86 mg/dL (ref 0.44–1.00)
GFR, Estimated: >60 mL/min (ref >60)
Glucose, Bld: 175 mg/dL — ABNORMAL HIGH (ref 70–99)
Potassium: 4.6 mmol/L (ref 3.5–5.1)
Sodium: 138 mmol/L (ref 135–145)

## 2022-04-17 LAB — ECHOCARDIOGRAM COMPLETE
AR max vel: 1.02 cm2
AV Area VTI: 1.06 cm2
AV Area mean vel: 0.96 cm2
AV Mean grad: 30 mmHg
AV Peak grad: 52.1 mmHg
Ao pk vel: 3.61 m/s
Area-P 1/2: 2.77 cm2
Height: 65 in
S' Lateral: 2.1 cm
Weight: 2880 oz

## 2022-04-17 LAB — HEMOGLOBIN A1C
Hgb A1c MFr Bld: 6.4 % — ABNORMAL HIGH (ref 4.8–5.6)
Mean Plasma Glucose: 136.98 mg/dL

## 2022-04-17 LAB — CBG MONITORING, ED: Glucose-Capillary: 185 mg/dL — ABNORMAL HIGH (ref 70–99)

## 2022-04-17 LAB — MAGNESIUM: Magnesium: 1.8 mg/dL (ref 1.7–2.4)

## 2022-04-17 MED ORDER — IOHEXOL 350 MG/ML SOLN
75.0000 mL | Freq: Once | INTRAVENOUS | Status: AC | PRN
  Administered 2022-04-17: 75 mL via INTRAVENOUS

## 2022-04-17 MED ORDER — ESTROGENS CONJUGATED 0.625 MG/GM VA CREA
1.0000 | TOPICAL_CREAM | VAGINAL | Status: DC
  Filled 2022-04-17: qty 30

## 2022-04-17 MED ORDER — ATORVASTATIN CALCIUM 40 MG PO TABS
40.0000 mg | ORAL_TABLET | Freq: Every day | ORAL | Status: DC
  Administered 2022-04-17 – 2022-04-18 (×2): 40 mg via ORAL
  Filled 2022-04-17 (×2): qty 1

## 2022-04-17 MED ORDER — DIPHENHYDRAMINE HCL 50 MG/ML IJ SOLN: 50.0000 mg | Freq: Once | INTRAMUSCULAR | Status: AC

## 2022-04-17 MED ORDER — DIPHENHYDRAMINE HCL 25 MG PO CAPS
50.0000 mg | ORAL_CAPSULE | Freq: Once | ORAL | Status: AC
  Administered 2022-04-17: 50 mg via ORAL
  Filled 2022-04-17: qty 2

## 2022-04-17 MED ORDER — PREDNISONE 50 MG PO TABS
50.0000 mg | ORAL_TABLET | Freq: Once | ORAL | Status: AC
  Administered 2022-04-17: 50 mg via ORAL
  Filled 2022-04-17: qty 1

## 2022-04-17 MED ORDER — DIPHENHYDRAMINE HCL 50 MG/ML IJ SOLN
50.0000 mg | Freq: Once | INTRAMUSCULAR | Status: AC
  Administered 2022-04-17: 50 mg via INTRAVENOUS
  Filled 2022-04-17: qty 1

## 2022-04-17 MED ORDER — METHENAMINE MANDELATE 1 G PO TABS
1.0000 g | ORAL_TABLET | Freq: Three times a day (TID) | ORAL | Status: DC
  Administered 2022-04-17 – 2022-04-18 (×4): 1 g via ORAL
  Filled 2022-04-17 (×7): qty 1

## 2022-04-17 MED ORDER — PREDNISONE 5 MG PO TABS
50.0000 mg | ORAL_TABLET | Freq: Four times a day (QID) | ORAL | Status: AC
  Administered 2022-04-17 (×3): 50 mg via ORAL
  Filled 2022-04-17 (×2): qty 3
  Filled 2022-04-17: qty 2

## 2022-04-17 NOTE — Progress Notes (Addendum)
PROGRESS NOTE    Autumn Allen  XLK:[id number] DOB: September 04, 1929 DOA: 04/16/2022 PCP: Burnard Bunting, MD   Brief Narrative: 88 year old with past medical history significant previous TIA, hypothyroidism, benign positional vertigo, hearing loss, depression, essential hypertension, hyperlipidemia who presented to the ER with confusion and word finding difficulty.  Last known well sometimes around 10 AM on 04/16/2022.  She could not remember how to use the phone.  She went to speak with the nurse at her assisted living and had trouble getting her words out.  Her symptoms completely resolved on arrival to the ED.  No code stroke was activated.  She was noted to be bradycardic and was admitted for further evaluation for TIA.   Assessment & Plan:   Principal Problem:   TIA (transient ischemic attack) Active Problems:   Gait disturbance   Essential hypertension   Hyperlipidemia   OSA (obstructive sleep apnea)   Hypothyroidism   Sensorineural hearing loss (SNHL), bilateral   1-Acute Ischemic stroke involving the Left Cerebral Hemisphere mostly in the Left MCA territory:  -Presents with Confusion, word finding difficulty -MRI with patchy scattered nonhemorrhagic infarct in the left hemisphere in the left MCA territory. -Continue home aspirin, started on Plavix. -Plan for CT angio head and neck -2D echo -A1c LDL 115, started on Lipitor.  Daughter report patient had some slurred speech around 10;30 am while she was talking with her over phone. I came back and evaluated patient, her speech was clear.  Continue to monitor.  For new mild right facial drop neurology order repeat CT head.   2-Bradycardia;  HR 45- Check TSH.  Check Mg Cardiology consulted. Recommend cardiac monitor at dc/   HTN; permissive HTN Hold nifedipine and diuretics.   Hypothyroidism; continue with synthroid.   sensorineural hearing loss: Patient uses hearing aid.     Estimated body mass index is 29.95  kg/m as calculated from the following:   Height as of this encounter: 5\' 5"  (1.651 m).   Weight as of this encounter: 81.6 kg.   DVT prophylaxis: Lovenox Code Status: Partial Code discussed with daughter and patient. No chest compression, no intubation, no defibrillation, ok with medications and BIPAP/  Family Communication: Daughter over phone-- 11/04 Disposition Plan:  Status is: Observation The patient will require care spanning > 2 midnights and should be moved to inpatient because:     Consultants:  Neurology  Cardiology   Procedures:  ECHO  Antimicrobials:    Subjective: She is alert, follows command. Denies chest pain or dyspnea. Denies dizziness. She got confuse, didn't know how to use phone  for that reason she presented to hospital.    Objective: Vitals:   04/17/22 0100 04/17/22 0200 04/17/22 0312 04/17/22 0555  BP: (!) 182/53 (!) 154/46  (!) 169/69  Pulse: (!) 48 (!) 46 (!) 41 (!) 49  Resp: 19 (!) 23 19 19   Temp:  97.9 F (36.6 C)  99.3 F (37.4 C)  TempSrc:  Oral  Oral  SpO2: 93% 91% 92% 93%  Weight:      Height:       No intake or output data in the 24 hours ending 04/17/22 0732 Filed Weights   04/16/22 1700  Weight: 81.6 kg    Examination:  General exam: Appears calm and comfortable  Respiratory system: Clear to auscultation. Respiratory effort normal. Cardiovascular system: S1 & S2 heard, RRR. No JVD, murmurs, rubs, gallops or clicks. No pedal edema. Gastrointestinal system: Abdomen is nondistended, soft and nontender. No organomegaly  or masses felt. Normal bowel sounds heard. Central nervous system: Alert and oriented. Keep eyes close, moves all 4 extremities.  Extremities: Symmetric 5 x 5 power.   Data Reviewed: I have personally reviewed following labs and imaging studies  CBC: Recent Labs  Lab 04/16/22 1807  WBC 8.5  NEUTROABS 5.1  HGB 15.0  HCT 46.4*  MCV 102.4*  PLT 222   Basic Metabolic Panel: Recent Labs  Lab  04/16/22 1807  NA 139  K 4.2  CL 100  CO2 30  GLUCOSE 98  BUN 17  CREATININE 0.80  CALCIUM 9.5   GFR: Estimated Creatinine Clearance: 49.3 mL/min (by C-G formula based on SCr of 0.8 mg/dL). Liver Function Tests: Recent Labs  Lab 04/16/22 1807  AST 22  ALT 18  ALKPHOS 46  BILITOT 0.7  PROT 6.9  ALBUMIN 3.8   No results for input(s): "LIPASE", "AMYLASE" in the last 168 hours. No results for input(s): "AMMONIA" in the last 168 hours. Coagulation Profile: Recent Labs  Lab 04/16/22 1807  INR 1.0   Cardiac Enzymes: No results for input(s): "CKTOTAL", "CKMB", "CKMBINDEX", "TROPONINI" in the last 168 hours. BNP (last 3 results) No results for input(s): "PROBNP" in the last 8760 hours. HbA1C: No results for input(s): "HGBA1C" in the last 72 hours. CBG: No results for input(s): "GLUCAP" in the last 168 hours. Lipid Profile: No results for input(s): "CHOL", "HDL", "LDLCALC", "TRIG", "CHOLHDL", "LDLDIRECT" in the last 72 hours. Thyroid Function Tests: No results for input(s): "TSH", "T4TOTAL", "FREET4", "T3FREE", "THYROIDAB" in the last 72 hours. Anemia Panel: No results for input(s): "VITAMINB12", "FOLATE", "FERRITIN", "TIBC", "IRON", "RETICCTPCT" in the last 72 hours. Sepsis Labs: No results for input(s): "PROCALCITON", "LATICACIDVEN" in the last 168 hours.  No results found for this or any previous visit (from the past 240 hour(s)).       Radiology Studies: [name] CONTRAST  Result Date: 04/17/2022 CLINICAL DATA:  Initial evaluation for acute TIA. EXAM: MRI HEAD WITHOUT CONTRAST TECHNIQUE: Multiplanar, multiecho pulse sequences of the brain and surrounding structures were obtained without intravenous contrast. COMPARISON:  CT from earlier the same day. FINDINGS: Brain: Cerebral volume within normal limits. Scattered patchy T2/FLAIR hyperintensity involving the periventricular and deep white matter, consistent with chronic small vessel ischemic disease, mild for  age. Scattered patchy foci of restricted diffusion are seen involving the left insula and adjacent mid and posterior left temporal region, consistent with acute left MCA distribution infarcts. Involvement of the left periatrial white matter. No associated hemorrhage or significant mass effect. Otherwise, gray-white matter differentiation maintained. No acute intracranial hemorrhage. No visible significant chronic intracranial blood products. No mass lesion, midline shift or mass effect. No hydrocephalus or extra-axial fluid collection. Pituitary gland and suprasellar region within normal limits. Vascular: Major intracranial vascular flow voids are maintained. Skull and upper cervical spine: Craniocervical junction normal. Bone marrow signal intensity within normal limits. No scalp soft tissue abnormality. Sinuses/Orbits: Globes orbital soft tissues demonstrate no acute finding. Prior bilateral ocular lens replacement. Scattered mucosal thickening noted about the ethmoidal air cells. Small right mastoid effusion, of doubtful significance. Other: None. IMPRESSION: 1. Scattered patchy acute ischemic nonhemorrhagic left MCA distribution infarcts involving the left insula and adjacent left temporal region. No associated hemorrhage or significant mass effect. 2. Underlying mild chronic microvascular ischemic disease. Electronically Signed   By: Rise Mu M.D.   On: 04/17/2022 00:18   CT HEAD WO CONTRAST ( )  Result Date: 04/16/2022 CLINICAL DATA:  Neuro deficit, acute, stroke suspected  EXAM: CT HEAD WITHOUT CONTRAST TECHNIQUE: Contiguous axial images were obtained from the base of the skull through the vertex without intravenous contrast. RADIATION DOSE REDUCTION: This exam was performed according to the departmental dose-optimization program which includes automated exposure control, adjustment of the mA and/or kV according to patient size and/or use of iterative reconstruction technique. COMPARISON:   06/18/2019 FINDINGS: Brain: No intracranial hemorrhage, mass effect, or midline shift. Generalized atrophy is normal for age. No hydrocephalus. The basilar cisterns are patent. Mild periventricular and deep white matter hypodensity consistent with chronic small vessel ischemia, unchanged. No evidence of territorial infarct or acute ischemia. No extra-axial or intracranial fluid collection. Vascular: No hyperdense vessel or unexpected calcification. Skull: No fracture or focal lesion. Sinuses/Orbits: Mild mucosal thickening of ethmoid air cells. Remote right orbital fracture. Bilateral cataract resection. Other: None. IMPRESSION: 1. No acute intracranial abnormality. 2. Unchanged age related atrophy and mild chronic small vessel ischemia. Electronically Signed   By: Narda Rutherford M.D.   On: 04/16/2022 19:40        Scheduled Meds:   stroke: early stages of recovery book   Does not apply Once   aspirin EC  81 mg Oral Daily   cholecalciferol  1,000 Units Oral Daily   clopidogrel  75 mg Oral Daily   [START ON 04/19/2022] conjugated estrogens  1 Applicatorful Vaginal Once per day on Mon Thu   enoxaparin (LOVENOX) injection  40 mg Subcutaneous Q24H   escitalopram  10 mg Oral QHS   felodipine  5 mg Oral Daily   indapamide  2.5 mg Oral Daily   levothyroxine  50 mcg Oral QPM   methenamine  1 g Oral TID AC & HS   predniSONE  50 mg Oral Q6H   Continuous Infusions:  sodium chloride Stopped (04/17/22 0021)   sodium chloride 75 mL/hr at 04/17/22 0612     LOS: 0 days    Time spent: 35 minutes.     Alba Cory, MD [facility]   If 7PM-7AM, please contact night-coverage [email or web address]  04/17/2022, 7:32 AM

## 2022-04-17 NOTE — ED Notes (Signed)
Placed an order for breakfast

## 2022-04-17 NOTE — Consult Note (Addendum)
Cardiology Consultation   Patient ID: Autumn Allen: [id number]; DOB: [birth year]  Admit date: 04/16/2022 Date of Consult: 04/17/2022  PCP:  Geoffry ParadiseAronson, Richard, MD   [facility] Providers Cardiologist:  Will Jorja LoaMartin Camnitz, MD    Patient Profile:   Autumn Allen is a 88 y.o. female with a hx of mild aortic stenosis, HTN, HLD, OSA on CPAP, history of TIA in 2016, depression, hypothyroidism who is being seen 04/17/2022 for the evaluation of bradycardia at the request of Dr. Sunnie Nielsenegalado  History of Present Illness:   Autumn Allen is a 88 year old female with above medical history who is followed by Dr. Elberta Fortisamnitz.  Per chart review, patient was referred to Dr. Elberta Fortisamnitz in 04/2016 for evaluation of TIA, palpitations.  At that time, patient reported that she had a TIA in 2016, it was her second TIA.  She also complained of palpitations that occurred every 2-3 weeks.  Echocardiogram on 04/19/2015 showed EF 55-60%, grade 1 diastolic dysfunction, mild-moderate dilation of the left atrium, mild dilation of the right atrium.  Patient wore a 30-day cardiac monitor in 04/2016 that showed sinus rhythm with episodes of artifact, no evidence of arrhythmia.  Unfortunately, she continued to have episodes of palpitations so an implantable loop recorder was placed on 06/29/2016.  The loop recorder was in for about 1 year, did not pick up any arrhythmias.  She requested that the recorder be removed and it was explanted on 08/10/2017.  Patient was last seen by cardiology in 08/2019.  Echocardiogram on 08/17/2018 showed EF 60-65%, mild LVH with impaired relaxation, normal RV systolic function.  At her appointment on 08/27/2019, patient reported that she had minimal palpitations.  She was otherwise doing well, has not needed to follow-up with cardiology since.  Patient was brought in by EMS on 11/3 from her living facility.  Patient reportedly was trying to call her friend and suddenly forgot how to use the  telephone.  She told the nurse that she was having difficulty who called EMS.  Symptoms resolved before arrival to the ED.  Initial vital signs in the ED showed heart rate 50 bpm, respirations 16 breaths/min, BP 172/57, oxygen saturation 96% on room air. Labs in the ED showed Na 139, K 4.2, creatinine 0.80, WBC 8.5, hemoglobin 15.0, platelets 222.   EKG showed sinus bradycardia with a heart rate of 50 bpm, PAC present.  Head CT showed no acute intracranial abnormality, age-related atrophy and mild chronic small vessel ischemia.  Brain MRI showed scattered patchy acute ischemic nonhemorrhagic left MCA distribution infarcts involving the left insula and adjacent left temporal region, underlying mild chronic intervascular ischemic disease.  Cardiology was asked to consult for evaluation of bradycardia  On interview, patient reports that she is in the hospital because she had a stroke yesterday. She had the nurse at her living facility call EMS because she "felt like something wasn't right." Denies having any chest pain, palpitations, dizziness, syncope, near syncope. In fact, she denies having any of these symptoms for the past several months. She has not felt the need to see a cardiologist since 2021. This AM, she is frustrated because she has not yet been given breakfast.   Past Medical History:  Diagnosis Date   Allergy    Aortic stenosis    a. mild to mod by Echo 07/2012   Arthritis    Asymptomatic varicose veins    Benign paroxysmal vertigo    Bradycardia, unspecified    Bronchitis, not specified as  acute or chronic    Cardiac murmur    Cataracts, bilateral    Chest pain, unspecified    Chronic cystitis    Depression    Diarrhea    Dyspnea    Dysuria    Frequency of micturition    Frequent urination    Gait disturbance 06/10/2014   Gastric ulcer    Hearing loss    Hemorrhoids    History of bone density study    History of colonoscopy    History of frequent urinary tract infections     History of urinary tract infection    Hx of cardiovascular stress test    a. ETT-Echo 3/12:  EF 60%, normal study   Hx of echocardiogram    a. Echo 2/14:  Mild LVH, EF 60-65%, Gr 1 diast dysfn, mild to mod AS, mean 17 mmHg, AVA 1.3 (VTI), trivial MR, mild LAE, PASP 31    Hyperlipemia    Hypertension    Hypothyroid    Left knee pain    Leg pain    Low back pain    Obstructive sleep apnea    OSA on CPAP    Other disorders of intestinal carbohydrate absorption    Other type of osteoarthritis, unspecified site    Overweight    Pain in thoracic spine    Palpitations    a. event monitor 3/14:  NSR, sinus brady   Plantar fasciitis    Plantar fasciitis    Right shoulder pain    S/P cholecystectomy    S/P TAH-BSO    Sleep apnea    Stroke (HCC)    tia 2016   TIA (transient ischemic attack)    Varicose veins of unspecified lower extremity with inflammation     Past Surgical History:  Procedure Laterality Date   ABDOMINAL HYSTERECTOMY     CHOLECYSTECTOMY     EP IMPLANTABLE DEVICE N/A 06/29/2016   Procedure: Loop Recorder Insertion;  Surgeon: Will Jorja Loa, MD;  Location: [facility];  Service: Cardiovascular;  Laterality: N/A;   FOOT SURGERY  1955   INCONTINENCE SURGERY     LOOP RECORDER REMOVAL N/A 08/10/2017   Procedure: LOOP RECORDER REMOVAL;  Surgeon: Regan Lemming, MD;  Location: [facility];  Service: Cardiovascular;  Laterality: N/A;   TONSILLECTOMY       Home Medications:  Prior to Admission medications   Medication Sig Start Date End Date Taking? Authorizing Provider  acetaminophen (TYLENOL) 500 MG tablet Take 1 tablet (500 mg total) by mouth every 8 (eight) hours as needed for moderate pain. 12/05/20   Ngetich, Dinah C, NP  aspirin 81 MG tablet Take 81 mg by mouth daily.    [provider]  atorvastatin (LIPITOR) 20 MG tablet Take 20 mg by mouth 3 (three) times a week.    [provider]  Biotin 5000 MCG CAPS Take 5,000 mcg  by mouth daily.     [provider]  cholecalciferol (VITAMIN D) 1000 units tablet Take 1,000 Units by mouth daily.    [provider]  conjugated estrogens (PREMARIN) vaginal cream Place 1 Applicatorful vaginally. Twice a week.    [provider]  escitalopram (LEXAPRO) 10 MG tablet Take 10 mg by mouth at bedtime.    [provider]  felodipine (PLENDIL) 5 MG 24 hr tablet Take 5 mg by mouth daily.    [provider]  indapamide (LOZOL) 2.5 MG tablet Take 2.5 mg by mouth daily.  [provider]  levothyroxine (SYNTHROID, LEVOTHROID) 50 MCG tablet Take 50 mcg by mouth every evening.     [provider]  methenamine (HIPREX) 1 g tablet Take 1 g by mouth 2 (two) times daily with a meal.    [provider]  metroNIDAZOLE (METROCREAM) 0.75 % cream Apply 1 application topically 2 (two) times daily as needed (for rosacea).    [provider]  nystatin (MYCOSTATIN) 100000 UNIT/ML suspension Take 5 mLs (500,000 Units total) by mouth 4 (four) times daily. 12/05/20   Ngetich, Donalee Citrin, NP    Inpatient Medications: Scheduled Meds:   stroke: early stages of recovery book   Does not apply Once   aspirin EC  81 mg Oral Daily   cholecalciferol  1,000 Units Oral Daily   clopidogrel  75 mg Oral Daily   [START ON 04/19/2022] conjugated estrogens  1 Applicatorful Vaginal Once per day on Mon Thu   enoxaparin (LOVENOX) injection  40 mg Subcutaneous Q24H   escitalopram  10 mg Oral QHS   levothyroxine  50 mcg Oral QPM   methenamine  1 g Oral TID AC & HS   predniSONE  50 mg Oral Q6H   Continuous Infusions:  sodium chloride Stopped (04/17/22 0021)   sodium chloride 75 mL/hr at 04/17/22 0612   PRN Meds: acetaminophen **OR** acetaminophen (TYLENOL) oral liquid 160 mg/5 mL **OR** acetaminophen, acetaminophen, senna-docusate  Allergies:    Allergies  Allergen Reactions   Metronidazole And Related Other (See Comments)    unknown    Bee Venom     Per new patient packet   Ciprofloxacin Other (See Comments)    Reaction unknown   Crestor [Rosuvastatin]    Etodolac Other (See Comments)    Reaction unknown   Iodine    Naproxen Other (See Comments)    Reaction unknown   Nitrofurantoin Other (See Comments)    Reaction unknown   Sulfonamide Derivatives Other (See Comments)    Reaction unknown   Alprazolam Rash   Epinephrine Palpitations   Macrobid [Nitrofurantoin Macrocrystal] Rash    Social History:   Social History   Socioeconomic History   Marital status: Widowed    Spouse name: Not on file   Number of children: 2   Years of education: college gr   Highest education level: Not on file  Occupational History   Occupation: retired Runner, broadcasting/film/video  Tobacco Use   Smoking status: Former    Years: 30.00    Types: Cigarettes   Smokeless tobacco: Never  Vaping Use   Vaping Use: Never used  Substance and Sexual Activity   Alcohol use: Yes    Alcohol/week: 1.0 standard drink of alcohol    Types: 1 Glasses of wine per week    Comment: wine sometimes   Drug use: No   Sexual activity: Not on file  Other Topics Concern   Not on file  Social History Narrative   Diet:      Caffeine:  2-3 cups of caffeine daily- decaf coffee.      Married, if yes what year: widow. Married 1955       Do you live in a house, apartment, assisted living, condo, trailer, ect: [facility], independent living.   Is it one or more stories: one      How many persons live in your home? 1      Pets:cno      Highest level or education completed: college degree      Current/Past  profession: retired Engineer, site      Exercise:  yes                Type and how often: daily         Living Will: Yes   DNR: Yes   POA/HPOA: Yes      Functional Status:   Do you have difficulty bathing or dressing yourself? No   Do you have difficulty preparing food or eating? No   Do you have difficulty managing your medications? No   Do you have  difficulty managing your finances? No   Do you have difficulty affording your medications? No      Patient is right handed.    Social Determinants of Health   Financial Resource Strain: Not on file  Food Insecurity: Not on file  Transportation Needs: Not on file  Physical Activity: Not on file  Stress: Not on file  Social Connections: Not on file  Intimate Partner Violence: Not on file    Family History:    Family History  Problem Relation Age of Onset   Heart attack Mother 68   Heart failure Mother    Arthritis Mother    Lung disease Father    Cancer - Prostate Father    Healthy Brother    Healthy Brother    Heart failure Maternal Uncle    Cancer Son      ROS:  Please see the history of present illness.   All other ROS reviewed and negative.     Physical Exam/Data:   Vitals:   04/17/22 0100 04/17/22 0200 04/17/22 0312 04/17/22 0555  BP: (!) 182/53 (!) 154/46  (!) 169/69  Pulse: (!) 48 (!) 46 (!) 41 (!) 49  Resp: 19 (!) Temp:  97.9 F (36.6 C)  99.3 F (37.4 C)  TempSrc:  Oral  Oral  SpO2: 93% 91% 92% 93%  Weight:      Height:       No intake or output data in the 24 hours ending 04/17/22 0835    04/16/2022    5:00 PM 12/08/2020    3:46 PM 12/05/2020    2:44 PM  Last 3 Weights  Weight (lbs) 180 lb 184 lb 187 lb  Weight (kg) 81.647 kg 83.462 kg 84.823 kg     Body mass index is 29.95 kg/m.  General: Elderly female in no acute distress. Sitting in the bed  HEENT: normal Neck: no JVD Vascular: No carotid bruits; Radial pulses 2+ bilaterally Cardiac:  normal S1, S2; Regular rhythm but bradycardic; SEM present with radiation to bilateral carotid arteries  Lungs:  Normal WOB on room air, diminished breath sounds in bilateral lung bases  Abd: soft, nontender, no hepatomegaly  Ext: no edema Musculoskeletal:  No deformities, BUE and BLE strength normal and equal Skin: warm and dry  Neuro:  CNs 2-12 intact, no focal abnormalities noted Psych:   Normal affect   EKG:  The EKG was personally reviewed and demonstrates:   Telemetry:  Telemetry was personally reviewed and demonstrates:  Sinus rhythm, HR 45-62 BPM   Relevant CV Studies:   Laboratory Data:  High Sensitivity Troponin:  No results for input(s): "TROPONINIHS" in the last 720 hours.   Chemistry Recent Labs  Lab 04/16/22 1807  NA 139  K 4.2  CL 100  CO2 30  GLUCOSE 98  BUN 17  CREATININE 0.80  CALCIUM 9.5  GFRNONAA >60  ANIONGAP 9    Recent  Labs  Lab 04/16/22 1807  PROT 6.9  ALBUMIN 3.8  AST 22  ALT 18  ALKPHOS 46  BILITOT 0.7   Lipids  Recent Labs  Lab 04/17/22 0500  CHOL 181  TRIG 94  HDL 47  LDLCALC 115*  CHOLHDL 3.9    Hematology Recent Labs  Lab 04/16/22 1807  WBC 8.5  RBC 4.53  HGB 15.0  HCT 46.4*  MCV 102.4*  MCH 33.1  MCHC 32.3  RDW 12.2  PLT 222   Thyroid No results for input(s): "TSH", "FREET4" in the last 168 hours.  BNPNo results for input(s): "BNP", "PROBNP" in the last 168 hours.  DDimer No results for input(s): "DDIMER" in the last 168 hours.   Radiology/Studies:  [name] CONTRAST  Result Date: 04/17/2022 CLINICAL DATA:  Initial evaluation for acute TIA. EXAM: MRI HEAD WITHOUT CONTRAST TECHNIQUE: Multiplanar, multiecho pulse sequences of the brain and surrounding structures were obtained without intravenous contrast. COMPARISON:  CT from earlier the same day. FINDINGS: Brain: Cerebral volume within normal limits. Scattered patchy T2/FLAIR hyperintensity involving the periventricular and deep white matter, consistent with chronic small vessel ischemic disease, mild for age. Scattered patchy foci of restricted diffusion are seen involving the left insula and adjacent mid and posterior left temporal region, consistent with acute left MCA distribution infarcts. Involvement of the left periatrial white matter. No associated hemorrhage or significant mass effect. Otherwise, gray-white matter differentiation maintained. No  acute intracranial hemorrhage. No visible significant chronic intracranial blood products. No mass lesion, midline shift or mass effect. No hydrocephalus or extra-axial fluid collection. Pituitary gland and suprasellar region within normal limits. Vascular: Major intracranial vascular flow voids are maintained. Skull and upper cervical spine: Craniocervical junction normal. Bone marrow signal intensity within normal limits. No scalp soft tissue abnormality. Sinuses/Orbits: Globes orbital soft tissues demonstrate no acute finding. Prior bilateral ocular lens replacement. Scattered mucosal thickening noted about the ethmoidal air cells. Small right mastoid effusion, of doubtful significance. Other: None. IMPRESSION: 1. Scattered patchy acute ischemic nonhemorrhagic left MCA distribution infarcts involving the left insula and adjacent left temporal region. No associated hemorrhage or significant mass effect. 2. Underlying mild chronic microvascular ischemic disease. Electronically Signed   By: Rise Mu M.D.   On: 04/17/2022 00:18   CT HEAD WO CONTRAST ( )  Result Date: 04/16/2022 CLINICAL DATA:  Neuro deficit, acute, stroke suspected EXAM: CT HEAD WITHOUT CONTRAST TECHNIQUE: Contiguous axial images were obtained from the base of the skull through the vertex without intravenous contrast. RADIATION DOSE REDUCTION: This exam was performed according to the departmental dose-optimization program which includes automated exposure control, adjustment of the mA and/or kV according to patient size and/or use of iterative reconstruction technique. COMPARISON:  06/18/2019 FINDINGS: Brain: No intracranial hemorrhage, mass effect, or midline shift. Generalized atrophy is normal for age. No hydrocephalus. The basilar cisterns are patent. Mild periventricular and deep white matter hypodensity consistent with chronic small vessel ischemia, unchanged. No evidence of territorial infarct or acute ischemia. No  extra-axial or intracranial fluid collection. Vascular: No hyperdense vessel or unexpected calcification. Skull: No fracture or focal lesion. Sinuses/Orbits: Mild mucosal thickening of ethmoid air cells. Remote right orbital fracture. Bilateral cataract resection. Other: None. IMPRESSION: 1. No acute intracranial abnormality. 2. Unchanged age related atrophy and mild chronic small vessel ischemia. Electronically Signed   By: Narda Rutherford M.D.   On: 04/16/2022 19:40     Assessment and Plan:   Bradycardia  - Patient presented following an episode of acute confusion.  Found to have an acute ischemic stroke involving the left cerebral hemisphere  - HR found to be in the 40s-50s on presentation  - Patient not on any av-nodal blocking medications  - Per review of vital signs at recent office visits, it appears that patient's HR is usually in the low 50s  - Echocardiogram pending  - No need for PPM at this time as patient is overall asymptomatic with HR 45-62 BPM (while resting in bed)  - TSH pending  - Recommend cardiac monitor at DC given stroke -- note patient did have an implantable loop recorder from 06/2016 - 07/2017 without evidence of arrhythmia   Cardiac Murmur on Exam  - Echo pending this admission. - Echo from 08/2018 showed mild calcification of mitral valve and moderate calcification of the aortic valve   HTN  - Neurology recommending permissive HTN  HLD  - Lipid panel this admission showed LDL 155, HDL 47, triglycerides 94  - PTA patient was taking lipitor 20 mg twice a week. Ordered lipitor 40 mg daily, titrate as tolerated (patient has intolerance to crestor)   Otherwise per primary  - Acute stroke-- on ASA, Plavix   - Bipolar disorder  - Hypothyroidism   Risk Assessment/Risk Scores:    For questions or updates, please contact [facility] Please consult [email or web address] for contact info under    Signed, Margie Billet, PA-C  04/17/2022 8:35 AM  Patient  seen and examined.  Agree with above documentation.  Autumn Allen is a 88 year old female with history of hypertension, hyperlipidemia, OSA, TIA, hypothyroidism were consulted for evaluation of bradycardia at the request of Dr. Tyrell Antonio.  She had a history of a TIA in 2016 and reported palpitations.  Echocardiogram at that time showed normal systolic function.  30-day monitor was unremarkable.  She continued to have episodes of palpitations and underwent loop recorder 06/2016.  Loop recorder was in place for over 1 year, no arrhythmias found.  Loop recorder was removed 07/2017.  Echocardiogram 08/2018 showed normal biventricular function, no significant valvular disease.  She has not been seen by cardiology since 08/2019.  EMS was called to her facility as she states that she was trying to call her friend and forgot how to use the telephone.  On presentation to the ED, initial vital signs notable for BP 172/57, pulse 50, SPO2 96% on room air.  Labs notable for creatinine 0.86, hemoglobin 14.9.  EKG shows sinus bradycardia, rate 45, T wave inversions in leads aVL, V1/2.  Head CT unremarkable.  Brain MRI shows scattered patchy acute ischemic left MCA distribution infarcts.  Telemetry shows sinus bradycardia with rate 40s to 60s.  On exam, patient is alert and oriented, bradycardic, regular, 3 out of 6 systolic murmur, lungs CTAB, no LE edema.  For her sinus bradycardia, heart rate running 40s to 60s.  She appears asymptomatic.  Continue to monitor on telemetry, no indication for pacemaker at this time.  Will check echocardiogram.  Donato Heinz, MD

## 2022-04-17 NOTE — Progress Notes (Signed)
  Echocardiogram 2D Echocardiogram has been performed.  Autumn Allen 04/17/2022, 3:12 PM

## 2022-04-17 NOTE — ED Notes (Signed)
Pt to CT on cardiac monitor with this RN

## 2022-04-17 NOTE — Progress Notes (Signed)
OT Cancellation Note  Patient Details Name: Autumn Allen MRN: [id number] DOB: 1930-06-10   Cancelled Treatment:    Reason Eval/Treat Not Completed: OT screened, no needs identified, will sign off. Spoke with evaluating PT and she reports pt is essentially at baseline that pt kept on PT caseload so if she stays here a few days she won't get deconditioned.  [place], OTR/L [facility] [phone] Office [phone]    Almon Register 04/17/2022, 11:31 AM

## 2022-04-17 NOTE — Consult Note (Signed)
Neurology Consultation  Reason for Consult: Speech difficulty, concern for stroke Referring Physician: Dr. Wyn Quaker  CC: Word finding difficulty, difficulty operating a phone  History is obtained from: Patient, chart  HPI: Autumn Allen is a 88 y.o. female past medical history of mild aortic stenosis, hypertension, hyperlipidemia, obstructive sleep apnea on CPAP, prior history of TIA 2016 without residual deficits presented to the emergency room for episode of confusion and word finding difficulty. Last known well was sometime around 10 AM this morning.  She said she was going to use the telephone and then she suddenly could not remember how to use the phone.  She went to speak with the nurse but also had some trouble getting her words out.  EMS was called.  Her symptoms are completely resolved.  No code stroke was activated.  Patient was not having any confusion while being examined by the ED provider but given speech symptoms, there was concern for TIA and also if she was noted to be bradycardic, for which she was admitted to the hospitalist for further work-up.  Neurology was consulted for stroke/TIA concern. Upon asking her any other symptoms, she said that the speech symptoms were off-and-on and they keep coming and going.  She denied any visual changes.  Denies headaches.  Denied tingling numbness or weakness.  Denied chest pain or palpitations.  She had a loop recorder implanted at some point which was explanted-indication for loop recorder was concern for abnormal heart rhythm during syncope work-up.  Monitor explanted in February 2019 without any abnormal major arrhythmia.  LKW: 10 AM IV thrombolysis given?: no, outside the window Premorbid modified Rankin scale (mRS):1   ROS: Full ROS was performed and is negative except as noted in the HPI.  Past Medical History:  Diagnosis Date   Allergy    Aortic stenosis    a. mild to mod by Echo 07/2012   Arthritis    Asymptomatic varicose  veins    Benign paroxysmal vertigo    Bradycardia, unspecified    Bronchitis, not specified as acute or chronic    Cardiac murmur    Cataracts, bilateral    Chest pain, unspecified    Chronic cystitis    Depression    Diarrhea    Dyspnea    Dysuria    Frequency of micturition    Frequent urination    Gait disturbance 06/10/2014   Gastric ulcer    Hearing loss    Hemorrhoids    History of bone density study    History of colonoscopy    History of frequent urinary tract infections    History of urinary tract infection    Hx of cardiovascular stress test    a. ETT-Echo 3/12:  EF 60%, normal study   Hx of echocardiogram    a. Echo 2/14:  Mild LVH, EF 60-65%, Gr 1 diast dysfn, mild to mod AS, mean 17 mmHg, AVA 1.3 (VTI), trivial MR, mild LAE, PASP 31    Hyperlipemia    Hypertension    Hypothyroid    Left knee pain    Leg pain    Low back pain    Obstructive sleep apnea    OSA on CPAP    Other disorders of intestinal carbohydrate absorption    Other type of osteoarthritis, unspecified site    Overweight    Pain in thoracic spine    Palpitations    a. event monitor 3/14:  NSR, sinus brady   Plantar fasciitis  Plantar fasciitis    Right shoulder pain    S/P cholecystectomy    S/P TAH-BSO    Sleep apnea    Stroke [facility])    tia 2016   TIA (transient ischemic attack)    Varicose veins of unspecified lower extremity with inflammation      Family History  Problem Relation Age of Onset   Heart attack Mother 38   Heart failure Mother    Arthritis Mother    Lung disease Father    Cancer - Prostate Father    Healthy Brother    Healthy Brother    Heart failure Maternal Uncle    Cancer Son      Social History:   reports that she has quit smoking. Her smoking use included cigarettes. She has never used smokeless tobacco. She reports current alcohol use of about 1.0 standard drink of alcohol per week. She reports that she does not use drugs.  Medications  Current  Facility-Administered Medications:     stroke: early stages of recovery book, , Does not apply, Once, Garba, Mohammad L, MD   [COMPLETED] sodium chloride 0.9 % bolus 500 mL, 500 mL, Intravenous, Once, Stopped at 04/16/22 2044 **FOLLOWED BY** 0.9 %  sodium chloride infusion, 100 mL/hr, Intravenous, Continuous, Linwood Dibbles, MD, Last Rate: 100 mL/hr at 04/16/22 1851, 100 mL/hr at 04/16/22 1851   0.9 %  sodium chloride infusion, , Intravenous, Continuous, Garba, Mohammad L, MD   acetaminophen (TYLENOL) tablet 650 mg, 650 mg, Oral, Q4H PRN **OR** acetaminophen (TYLENOL) 160 MG/5ML solution 650 mg, 650 mg, Per Tube, Q4H PRN **OR** acetaminophen (TYLENOL) suppository 650 mg, 650 mg, Rectal, Q4H PRN, Mikeal Hawthorne, Mohammad L, MD   acetaminophen (TYLENOL) tablet 500 mg, 500 mg, Oral, Q8H PRN, Mikeal Hawthorne, Mohammad L, MD   aspirin EC tablet 81 mg, 81 mg, Oral, Daily, Garba, Mohammad L, MD   cholecalciferol (VITAMIN D3) 25 MCG (1000 UNIT) tablet 1,000 Units, 1,000 Units, Oral, Daily, Garba, Mohammad L, MD   clopidogrel (PLAVIX) tablet 75 mg, 75 mg, Oral, Daily, Rometta Emery, MD   [START ON 04/19/2022] conjugated estrogens (PREMARIN) vaginal cream 1 Applicatorful, 1 Applicatorful, Vaginal, Once per day on Mon Thu, Garba, Mohammad L, MD   enoxaparin (LOVENOX) injection 40 mg, 40 mg, Subcutaneous, Q24H, Garba, Mohammad L, MD   escitalopram (LEXAPRO) tablet 10 mg, 10 mg, Oral, QHS, Garba, Mohammad L, MD   felodipine (PLENDIL) 24 hr tablet 5 mg, 5 mg, Oral, Daily, Garba, Mohammad L, MD   indapamide (LOZOL) tablet 2.5 mg, 2.5 mg, Oral, Daily, Mikeal Hawthorne, Mohammad L, MD   levothyroxine (SYNTHROID) tablet 50 mcg, 50 mcg, Oral, QPM, Garba, Mohammad L, MD   methenamine (MANDELAMINE) tablet 1 g, 1 g, Oral, TID AC & HS, Garba, Mohammad L, MD   senna-docusate (Senokot-S) tablet 1 tablet, 1 tablet, Oral, QHS PRN, Rometta Emery, MD  Current Outpatient Medications:    acetaminophen (TYLENOL) 500 MG tablet, Take 1 tablet (500 mg total)  by mouth every 8 (eight) hours as needed for moderate pain., Disp: 30 tablet, Rfl: 0   aspirin 81 MG tablet, Take 81 mg by mouth daily., Disp: , Rfl:    atorvastatin (LIPITOR) 20 MG tablet, Take 20 mg by mouth 3 (three) times a week., Disp: , Rfl:    Biotin 5000 MCG CAPS, Take 5,000 mcg by mouth daily. , Disp: , Rfl:    cholecalciferol (VITAMIN D) 1000 units tablet, Take 1,000 Units by mouth daily., Disp: , Rfl:  conjugated estrogens (PREMARIN) vaginal cream, Place 1 Applicatorful vaginally. Twice a week., Disp: , Rfl:    escitalopram (LEXAPRO) 10 MG tablet, Take 10 mg by mouth at bedtime., Disp: , Rfl:    felodipine (PLENDIL) 5 MG 24 hr tablet, Take 5 mg by mouth daily., Disp: , Rfl:    indapamide (LOZOL) 2.5 MG tablet, Take 2.5 mg by mouth daily., Disp: , Rfl:    levothyroxine (SYNTHROID, LEVOTHROID) 50 MCG tablet, Take 50 mcg by mouth every evening. , Disp: , Rfl:    methenamine (HIPREX) 1 g tablet, Take 1 g by mouth 2 (two) times daily with a meal., Disp: , Rfl:    metroNIDAZOLE (METROCREAM) 0.75 % cream, Apply 1 application topically 2 (two) times daily as needed (for rosacea)., Disp: , Rfl:    nystatin (MYCOSTATIN) 100000 UNIT/ML suspension, Take 5 mLs (500,000 Units total) by mouth 4 (four) times daily., Disp: 60 mL, Rfl: 0   Exam: Current vital signs: BP 109/64   Pulse (!) 49   Temp 97.8 F (36.6 C) (Oral)   Resp (!) 24   Ht 5\' 5"  (1.651 m)   Wt 81.6 kg   SpO2 90%   BMI 29.95 kg/m  Vital signs in last 24 hours: Temp:  [97.8 F (36.6 C)] 97.8 F (36.6 C) (11/03 2117) Pulse Rate:  [41-60] 49 (11/03 2300) Resp:  [12-24] 24 (11/03 2300) BP: (109-181)/(48-65) 109/64 (11/03 2300) SpO2:  [90 %-97 %] 90 % (11/03 2300) Weight:  [81.6 kg] 81.6 kg (11/03 1700) General: Awake alert in no distress HEENT: Normocephalic atraumatic Lungs: Clear Cardiovascular: Regular rate rhythm Respiratory: Breathing well saturating normally on room air with clear lungs Abdomen nondistended  nontender Neurological exam Awake alert oriented x3 No dysarthria No evidence of aphasia Cranial nerves II through XII intact Motor examination with no drift in any of the 4 extremities although she is slightly weaker on the left side and that has been chronic per the patient. Sensory exam intact to light touch Coordination with no dysmetria NIH stroke scale-0  Labs I have reviewed labs in epic and the results pertinent to this consultation are:  CBC    Component Value Date/Time   WBC 8.5 04/16/2022 1807   RBC 4.53 04/16/2022 1807   HGB 15.0 04/16/2022 1807   HCT 46.4 (H) 04/16/2022 1807   PLT 222 04/16/2022 1807   MCV 102.4 (H) 04/16/2022 1807   MCH 33.1 04/16/2022 1807   MCHC 32.3 04/16/2022 1807   RDW 12.2 04/16/2022 1807   LYMPHSABS 2.1 04/16/2022 1807   MONOABS 1.0 04/16/2022 1807   EOSABS 0.2 04/16/2022 1807   BASOSABS 0.1 04/16/2022 1807    CMP     Component Value Date/Time   NA 139 04/16/2022 1807   NA 142 08/13/2019 0000   K 4.2 04/16/2022 1807   CL 100 04/16/2022 1807   CO2 30 04/16/2022 1807   GLUCOSE 98 04/16/2022 1807   BUN 17 04/16/2022 1807   BUN 19 08/13/2019 0000   CREATININE 0.80 04/16/2022 1807   CALCIUM 9.5 04/16/2022 1807   PROT 6.9 04/16/2022 1807   ALBUMIN 3.8 04/16/2022 1807   AST 22 04/16/2022 1807   ALT 18 04/16/2022 1807   ALKPHOS 46 04/16/2022 1807   BILITOT 0.7 04/16/2022 1807   GFRNONAA >60 04/16/2022 1807   GFRAA >60 06/18/2019 2014    Lipid Panel     Component Value Date/Time   CHOL 153 08/13/2019 0000   TRIG 178 (A) 08/13/2019 0000  HDL 39 08/13/2019 0000   CHOLHDL 5.5 04/19/2015 0615   VLDL 45 (H) 04/19/2015 0615   LDLCALC 78 08/13/2019 0000   LDLDIRECT 135.1 08/26/2010 0844     Imaging I have reviewed the images obtained:  CT-head no acute changes  MRI examination of the brain: Patchy scattered infarcts in the left hemisphere in the left MCA territory.  Assessment: 88 year old woman with above past medical  history presenting for an episode of confusion and word finding difficulty, and on current neurological exam has no deficits.  MRI of the brain reveals patchy scattered nonhemorrhagic infarct in the left hemisphere in the left MCA territory. Needs work-up for the acute ischemic stroke. As prior history of palpitations, evaluated with a loop recorder that was eventually explanted-no arrhythmias noted up until 2019.  Impression: Acute ischemic stroke involving the left cerebral hemisphere mostly in the left MCA territory.  Recommendations: Admit to hospitalist Frequent neurochecks Telemetry CT angio head and neck 2D echo A1c Lipid panel On home aspirin 81 mg.  Will add Plavix 75 for now. Duration of dual antiplatelets to be determined based on test results High intensity statin PT OT Speech therapy Allow for permissive hypertension-treat only if systolic blood pressures greater than 220. Plan discussed with admitting hospitalist Dr. Mikeal Hawthorne  -- Milon Dikes, MD Neurologist [facility] Pager: [phone]

## 2022-04-17 NOTE — Progress Notes (Addendum)
STROKE TEAM PROGRESS NOTE   INTERVAL HISTORY No family at the bedside. Patient is not being cooperative with exam and will not cooperate with answering questions. Yelling that she needs to go for tests. She repeats "I don't know" "something was not right". When asked what brought her into the hospital she repeats" I have told so many people the story I don't know anymore"   Dr Leonie Man went to examine patient and no focal deficits. She states she feels back to baseline. She is HOH, alert and oriented, moves all extremities equally. Can name objects and repeat. No aphasia  MRI shows patchy embolic left MCA branch infarct.  LDL cholesterol 115 mg percent.  Vitals:   04/17/22 0312 04/17/22 0555 04/17/22 0845 04/17/22 1007  BP:  (!) 169/69 (!) 148/55   Pulse: (!) 41 (!) 49 (!) 44   Resp: 19 19 16    Temp:  99.3 F (37.4 C)  98.6 F (37 C)  TempSrc:  Oral  Oral  SpO2: 92% 93% 95%   Weight:      Height:       CBC:  Recent Labs  Lab 04/16/22 1807 04/17/22 0849  WBC 8.5 5.7  NEUTROABS 5.1  --   HGB 15.0 14.9  HCT 46.4* 44.7  MCV 102.4* 99.6  PLT 222 409   Basic Metabolic Panel:  Recent Labs  Lab 04/16/22 1807 04/17/22 0849  NA 139 138  K 4.2 4.6  CL 100 101  CO2 30 25  GLUCOSE 98 175*  BUN 17 15  CREATININE 0.80 0.86  CALCIUM 9.5 8.9  MG  --  1.8   Lipid Panel:  Recent Labs  Lab 04/17/22 0500  CHOL 181  TRIG 94  HDL 47  CHOLHDL 3.9  VLDL 19  LDLCALC 115*   HgbA1c: No results for input(s): "HGBA1C" in the last 168 hours. Urine Drug Screen: No results for input(s): "LABOPIA", "COCAINSCRNUR", "LABBENZ", "AMPHETMU", "THCU", "LABBARB" in the last 168 hours.  Alcohol Level  Recent Labs  Lab 04/16/22 1807  ETH <10    IMAGING past 24 hours [name] CONTRAST  Result Date: 04/17/2022 CLINICAL DATA:  Initial evaluation for acute TIA. EXAM: MRI HEAD WITHOUT CONTRAST TECHNIQUE: Multiplanar, multiecho pulse sequences of the brain and surrounding structures were  obtained without intravenous contrast. COMPARISON:  CT from earlier the same day. FINDINGS: Brain: Cerebral volume within normal limits. Scattered patchy T2/FLAIR hyperintensity involving the periventricular and deep white matter, consistent with chronic small vessel ischemic disease, mild for age. Scattered patchy foci of restricted diffusion are seen involving the left insula and adjacent mid and posterior left temporal region, consistent with acute left MCA distribution infarcts. Involvement of the left periatrial white matter. No associated hemorrhage or significant mass effect. Otherwise, gray-white matter differentiation maintained. No acute intracranial hemorrhage. No visible significant chronic intracranial blood products. No mass lesion, midline shift or mass effect. No hydrocephalus or extra-axial fluid collection. Pituitary gland and suprasellar region within normal limits. Vascular: Major intracranial vascular flow voids are maintained. Skull and upper cervical spine: Craniocervical junction normal. Bone marrow signal intensity within normal limits. No scalp soft tissue abnormality. Sinuses/Orbits: Globes orbital soft tissues demonstrate no acute finding. Prior bilateral ocular lens replacement. Scattered mucosal thickening noted about the ethmoidal air cells. Small right mastoid effusion, of doubtful significance. Other: None. IMPRESSION: 1. Scattered patchy acute ischemic nonhemorrhagic left MCA distribution infarcts involving the left insula and adjacent left temporal region. No associated hemorrhage or significant mass effect. 2. Underlying  mild chronic microvascular ischemic disease. Electronically Signed   By: Rise Mu M.D.   On: 04/17/2022 00:18   CT HEAD WO CONTRAST ( )  Result Date: 04/16/2022 CLINICAL DATA:  Neuro deficit, acute, stroke suspected EXAM: CT HEAD WITHOUT CONTRAST TECHNIQUE: Contiguous axial images were obtained from the base of the skull through the vertex  without intravenous contrast. RADIATION DOSE REDUCTION: This exam was performed according to the departmental dose-optimization program which includes automated exposure control, adjustment of the mA and/or kV according to patient size and/or use of iterative reconstruction technique. COMPARISON:  06/18/2019 FINDINGS: Brain: No intracranial hemorrhage, mass effect, or midline shift. Generalized atrophy is normal for age. No hydrocephalus. The basilar cisterns are patent. Mild periventricular and deep white matter hypodensity consistent with chronic small vessel ischemia, unchanged. No evidence of territorial infarct or acute ischemia. No extra-axial or intracranial fluid collection. Vascular: No hyperdense vessel or unexpected calcification. Skull: No fracture or focal lesion. Sinuses/Orbits: Mild mucosal thickening of ethmoid air cells. Remote right orbital fracture. Bilateral cataract resection. Other: None. IMPRESSION: 1. No acute intracranial abnormality. 2. Unchanged age related atrophy and mild chronic small vessel ischemia. Electronically Signed   By: Narda Rutherford M.D.   On: 04/16/2022 19:40    PHYSICAL EXAM  Temp:  [97.8 F (36.6 C)-99.3 F (37.4 C)] 98 F (36.7 C) (11/04 1258) Pulse Rate:  [41-60] 55 (11/04 1400) Resp:  [12-24] 20 (11/04 1400) BP: (109-182)/(41-69) 121/41 (11/04 1400) SpO2:  [90 %-98 %] 98 % (11/04 1400) Weight:  [81.6 kg] 81.6 kg (11/03 1700)  General - Well nourished, well developed, in no apparent distress. Cardiovascular - Regular rhythm and rate.  Mental Status -  Level of arousal and orientation to time, place, and person were intact. Language including expression, naming, repetition, comprehension was assessed and found intact. Attention span and concentration were normal. Recent and remote memory were intact. Fund of Knowledge was assessed and was intact.  Cranial Nerves II - XII - II - Visual field intact OU. III, IV, VI - Extraocular movements  intact. V - Facial sensation intact bilaterally. VII - Facial movement intact bilaterally. VIII - Hearing & vestibular intact bilaterally. X - Palate elevates symmetrically. XI - Chin turning & shoulder shrug intact bilaterally. XII - Tongue protrusion intact.  Motor Strength - The patient's strength was normal in all extremities and pronator drift was absent.  Bulk was normal and fasciculations were absent.   Motor Tone - Muscle tone was assessed at the neck and appendages and was normal.  Sensory - Light touch, temperature/pinprick were assessed and were symmetrical.    Coordination - The patient had normal movements in the hands and feet with no ataxia or dysmetria.  Tremor was absent.  Gait and Station - deferred.  ASSESSMENT/PLAN Ms. ABRISH ERNY is a 88 y.o. female with history of mild aortic stenosis, hypertension, hyperlipidemia, obstructive sleep apnea on CPAP, prior history of TIA 2016 without residual deficits presented to the emergency room for episode of confusion and word finding difficulty.   Stroke Acute Left MCA ischemic infarcts Etiology:  likely cardio embolic   CT head 1. No acute intracranial abnormality. 2. Unchanged age related atrophy and mild chronic small vessel ischemia. CTA head & neck pending  MRI   1. Scattered patchy acute ischemic nonhemorrhagic left MCA distribution infarcts involving the left insula and adjacent left temporal region. No associated hemorrhage or significant mass effect. 2. Underlying mild chronic microvascular ischemic disease 2D Echo EF 70-75%. left ventricle  has hyperdynamic function. mild left ventricular hypertrophy. Left ventricular diastolic parameters are consistent with Grade I diastolic dysfunction   LDL 115 HgbA1c 6.1 VTE prophylaxis - Lovenox    Diet   Diet Heart Room service appropriate? Yes; Fluid consistency: Thin   aspirin 81 mg daily prior to admission, now on aspirin 81 mg daily and clopidogrel 75 mg daily.  For 3 weeks than Plavix alone  Therapy recommendations:  pending Disposition:  pending  Hypertension Home meds:  none Stable Permissive hypertension (OK if < 220/120) but gradually normalize in 5-7 days Long-term BP goal normotensive  Hyperlipidemia Home meds:  atorvastatin 20mg , increased to 40 mgresumed in hospital LDL 115, goal < 70 Continue statin at discharge  Other Stroke Risk Factors Advanced Age >/= 19  Hx stroke/TIA Coronary artery disease Obstructive sleep apnea, on CPAP at home  Other Active Problems Anxiety/depression hypothyroidism  Hospital day # 0  76 DNP, [id number]   STROKE MD NOTE :  I have personally obtained history,examined this patient, reviewed notes, independently viewed imaging studies, participated in medical decision making and plan of care.ROS completed by me personally and pertinent positives fully documented  I have made any additions or clarifications directly to the above note. Agree with note above.  Patient presented with aphasia and word finding difficulties which appear to have resolved and MRI scan shows embolic left MCA branch infarct likely of cryptogenic etiology.  Recommend continue aspirin and Plavix for 3 weeks followed by Plavix alone and patient will need outpatient cardiac monitoring with 30-day heart monitor after discharge.  Aggressive risk factor modification.  Discussed with patient and Dr. Gevena Mart.  Greater than 50% time during this 50-minute visit was spent in counseling and coordination of care about her cryptogenic stroke and discussion about evaluation and treatment and prevention of stroke and answering questions.  Follow-up as an outpatient stroke clinic with nurse practitioner 2 months or call earlier if necessary  Thereasa Parkin, MD Medical Director [facility] Pager: [phone] 04/17/2022 4:57 PM  To contact Stroke Continuity provider, please refer to 13/09/2021. After hours, contact General  Neurology

## 2022-04-17 NOTE — Evaluation (Signed)
Physical Therapy Evaluation Patient Details Name: Autumn Allen MRN: [id number] DOB: [birth year] Today's Date: 04/17/2022  History of Present Illness  Pt is 88 yo female who presents on 11/3 with confusion and word finding difficulty. Symptoms resolved but pt also noted to be bradycardic. 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 PMH: TIA in 2016, mild aortic stenosis, hypertension, hyperlipidemia, obstructive sleep apnea on CPAP, BPPV  Clinical Impression  Pt admitted with above diagnosis. Pt's symptoms appear to have resolved. Pt reports she fell 2 days ago and questions if it was due to stroke. Pt ambulates with rollator at baseline at [facility]. Pt ambulated 400' with IV pole and HHA. Fatigued by end of ambulation but able to return to room and get back on stretcher. HR 46 bpm after ambulation (48 bpm before) and SPO2 in 90's on RA. Will follow pt acutely for balance but do not anticipate her needing PT after d/c. Pt with STM deficits and has difficulty comprehending new info but expect this is her baseline and she is quite impressive for 89.  Pt currently with functional limitations due to the deficits listed below (see PT Problem List). Pt will benefit from skilled PT to increase their independence and safety with mobility to allow discharge to the venue listed below.          Recommendations for follow up therapy are one component of a multi-disciplinary discharge planning process, led by the attending physician.  Recommendations may be updated based on patient status, additional functional criteria and insurance authorization.  Follow Up Recommendations No PT follow up      Assistance Recommended at Discharge PRN  Patient can return home with the following  Assist for transportation    Equipment Recommendations None recommended by PT  Recommendations for Other Services       Functional Status Assessment Patient has not had a recent decline in their  functional status     Precautions / Restrictions Precautions Precautions: Fall Precaution Comments: fell 2 days ago but reports she caught herself and lowered to the floor without hurting anything. Has balance deficits at baseline Restrictions Weight Bearing Restrictions: No      Mobility  Bed Mobility Overal bed mobility: Needs Assistance Bed Mobility: Supine to Sit, Sidelying to Sit   Sidelying to sit: Supervision Supine to sit: Supervision     General bed mobility comments: supervision for safety getting on/off stretcher in ED    Transfers Overall transfer level: Needs assistance Equipment used: 1 person hand held assist Transfers: Sit to/from Stand Sit to Stand: Supervision, Min guard           General transfer comment: supervision from stretcher, min guard from low toilet and took pt 2 attempts    Ambulation/Gait Ambulation/Gait assistance: Min assist Gait Distance (Feet): 400 Feet Assistive device: IV Pole, 1 person hand held assist Gait Pattern/deviations: Step-through pattern Gait velocity: WFL for age Gait velocity interpretation: >2.62 ft/sec, indicative of community ambulatory   General Gait Details: no RW available so pt pushed pole and had unilateral HHA. Pt wanted to keep walking as she reports she walks for exercise at home. By end of 400' pt with noted faitgue and decreased verbalization but safely made it back into room. HR 48 bom before ambulation and 46 bom after. SPO2 maintained in 90's on RA  Stairs            Wheelchair Mobility    Modified Rankin (Stroke Patients Only) Modified Rankin (  Stroke Patients Only) Pre-Morbid Rankin Score: No symptoms Modified Rankin: No symptoms     Balance Overall balance assessment: History of Falls, Needs assistance Sitting-balance support: No upper extremity supported, Feet supported Sitting balance-Leahy Scale: Good     Standing balance support: No upper extremity supported Standing  balance-Leahy Scale: Fair Standing balance comment: pt able to maintain static stance without support, limitations evident with dynamic balance                             Pertinent Vitals/Pain Pain Assessment Pain Assessment: No/denies pain    Home Living Family/patient expects to be discharged to:: Private residence Living Arrangements: Alone Available Help at Discharge: Family;Available PRN/intermittently Type of Home: Independent living facility ([facility]) Home Access: Level entry;Elevator       Home Layout: One level Home Equipment: Rollator (4 wheels);Grab bars - toilet;Grab bars - tub/shower Additional Comments: pt's daughter lives at [facility] but comes up frequently    Prior Function Prior Level of Function : Independent/Modified Independent             Mobility Comments: ambulates with rollator       Hand Dominance        Extremity/Trunk Assessment   Upper Extremity Assessment Upper Extremity Assessment: Overall WFL for tasks assessed    Lower Extremity Assessment Lower Extremity Assessment: Overall WFL for tasks assessed    Cervical / Trunk Assessment Cervical / Trunk Assessment: Normal  Communication   Communication: No difficulties  Cognition Arousal/Alertness: Awake/alert Behavior During Therapy: WFL for tasks assessed/performed Overall Cognitive Status: No family/caregiver present to determine baseline cognitive functioning                                 General Comments: pt with STM deficits and has difficulty comprehending new info and gets frustrated with repeated explaining but still quite impressive for 88 yo and anticipate that this is her baseline        General Comments      Exercises     Assessment/Plan    PT Assessment Patient needs continued PT services  PT Problem List Decreased balance       PT Treatment Interventions DME instruction;Gait training;Functional mobility training;Therapeutic  activities;Therapeutic exercise;Balance training;Patient/family education    PT Goals (Current goals can be found in the Care Plan section)  Acute Rehab PT Goals Patient Stated Goal: return home, get breakfast PT Goal Formulation: With patient Time For Goal Achievement: 05/01/22 Potential to Achieve Goals: Good    Frequency Min 3X/week     Co-evaluation               AM-PAC PT "6 Clicks" Mobility  Outcome Measure Help needed turning from your back to your side while in a flat bed without using bedrails?: None Help needed moving from lying on your back to sitting on the side of a flat bed without using bedrails?: None Help needed moving to and from a bed to a chair (including a wheelchair)?: A Little Help needed standing up from a chair using your arms (e.g., wheelchair or bedside chair)?: A Little Help needed to walk in hospital room?: A Little Help needed climbing 3-5 steps with a railing? : A Little 6 Click Score: 20    End of Session Equipment Utilized During Treatment: Gait belt Activity Tolerance: Patient tolerated treatment well Patient left: in bed;with call bell/phone  within reach Nurse Communication: Mobility status PT Visit Diagnosis: Unsteadiness on feet (R26.81)    Time: 3888-2800 PT Time Calculation (min) (ACUTE ONLY): 40 min   Charges:   PT Evaluation $PT Eval Moderate Complexity: 1 Mod PT Treatments $Gait Training: 23-37 mins        Leighton Roach, PT  Acute Rehab Services Secure chat preferred Office [place] 04/17/2022, 10:04 AM

## 2022-04-18 ENCOUNTER — Other Ambulatory Visit: Payer: Self-pay | Admitting: Physician Assistant

## 2022-04-18 DIAGNOSIS — R001 Bradycardia, unspecified: Secondary | ICD-10-CM | POA: Diagnosis not present

## 2022-04-18 DIAGNOSIS — G459 Transient cerebral ischemic attack, unspecified: Secondary | ICD-10-CM | POA: Diagnosis not present

## 2022-04-18 DIAGNOSIS — I350 Nonrheumatic aortic (valve) stenosis: Secondary | ICD-10-CM

## 2022-04-18 DIAGNOSIS — I639 Cerebral infarction, unspecified: Secondary | ICD-10-CM | POA: Diagnosis not present

## 2022-04-18 MED ORDER — LIVING WELL WITH DIABETES BOOK
Freq: Once | Status: AC
  Filled 2022-04-18: qty 1

## 2022-04-18 MED ORDER — ATORVASTATIN CALCIUM 40 MG PO TABS: 40.0000 mg | ORAL_TABLET | Freq: Every day | ORAL | 1 refills | Status: AC

## 2022-04-18 MED ORDER — CLOPIDOGREL BISULFATE 75 MG PO TABS: 75.0000 mg | ORAL_TABLET | Freq: Every day | ORAL | 3 refills | Status: AC

## 2022-04-18 MED ORDER — ASPIRIN 81 MG PO TABS: 81.0000 mg | ORAL_TABLET | Freq: Every morning | ORAL | 0 refills | Status: DC

## 2022-04-18 NOTE — TOC Transition Note (Signed)
Transition of Care [facility]) - CM/SW Discharge Note   Patient Details  Name: RINDI BEECHY MRN: [id number] Date of Birth: 07/31/1929  [facility] CM/SW Contact:  Carles Collet, RN Phone Number: 04/18/2022, 3:03 PM   Clinical Narrative:     Damaris Schooner w patient and daughter at bedside. Patient is from [place] and she would like to return w [facility] RN- med management, OT SLP HHA.  She has not had HH before and is agreeable to CM pick from highly rated agency on Medicare list. Alvis Lemmings accepted referral and will call daughter Suzanne Boron for appointment time per their request. Suzanne Boron will be staying with her mom for the next few days.  No DME needs.  No other TOC needs identified for DC  Final next level of care: Home w Home Health Services Barriers to Discharge: No Barriers Identified   Patient Goals and CMS Choice Patient states their goals for this hospitalization and ongoing recovery are:: to go home CMS Medicare.gov Compare Post Acute Care list provided to:: Patient Choice offered to / list presented to : Patient, Adult Children  Discharge Placement                       Discharge Plan and Services                          HH Arranged: RN, Nurse's Aide, Speech Therapy, OT HH Agency: [place] Date [place]: 04/18/22 Time [place]: 36 Representative spoke with at [place]: [place] ([place]) Interventions     Readmission Risk Interventions     No data to display

## 2022-04-18 NOTE — Progress Notes (Signed)
STROKE TEAM PROGRESS NOTE   INTERVAL HISTORY Her daughter at the bedside. Patient is is having echocardiogram performed at the bedside. She states her speech is improved almost back to baseline though she still has Tricity at the bedside. CT angiogram yesterday showed left M2 inferior division occlusion and mild atheromatous changes at carotid bifurcations.  Echocardiogram shows ejection fraction 70 to 75% with no cardiac source of embolism. Vitals:   04/17/22 2022 04/18/22 0006 04/18/22 0723 04/18/22 1256  BP: (!) 158/56 (!) 149/45 (!) 142/57 (!) 146/45  Pulse: (!) 51 (!) 47 (!) 48 (!) 42  Resp: 20 18 16    Temp: (!) 97.5 F (36.4 C) 97.8 F (36.6 C) 98.4 F (36.9 C) 98 F (36.7 C)  TempSrc: Axillary Axillary Oral   SpO2: 95% 97% 100% 96%  Weight:      Height:       CBC:  Recent Labs  Lab 04/16/22 1807 04/17/22 0849  WBC 8.5 5.7  NEUTROABS 5.1  --   HGB 15.0 14.9  HCT 46.4* 44.7  MCV 102.4* 99.6  PLT 222 [id number]   Basic Metabolic Panel:  Recent Labs  Lab 04/16/22 1807 04/17/22 0849  NA 139 138  K 4.2 4.6  CL 100 101  CO2 30 25  GLUCOSE 98 175*  BUN 17 15  CREATININE 0.80 0.86  CALCIUM 9.5 8.9  MG  --  1.8   Lipid Panel:  Recent Labs  Lab 04/17/22 0500  CHOL 181  TRIG 94  HDL 47  CHOLHDL 3.9  VLDL 19  LDLCALC 115*   HgbA1c:  Recent Labs  Lab 04/17/22 2149  HGBA1C 6.4*   Urine Drug Screen: No results for input(s): "LABOPIA", "COCAINSCRNUR", "LABBENZ", "AMPHETMU", "THCU", "LABBARB" in the last 168 hours.  Alcohol Level  Recent Labs  Lab 04/16/22 1807  ETH <10    IMAGING past 24 hours CT ANGIO HEAD NECK W WO CM  Result Date: 04/17/2022 CLINICAL DATA:  Follow-up examination for stroke. EXAM: CT ANGIOGRAPHY HEAD AND NECK TECHNIQUE: Multidetector CT imaging of the head and neck was performed using the standard protocol during bolus administration of intravenous contrast. Multiplanar CT image reconstructions and MIPs were obtained to evaluate the  vascular anatomy. Carotid stenosis measurements (when applicable) are obtained utilizing NASCET criteria, using the distal internal carotid diameter as the denominator. RADIATION DOSE REDUCTION: This exam was performed according to the departmental dose-optimization program which includes automated exposure control, adjustment of the mA and/or kV according to patient size and/or use of iterative reconstruction technique. CONTRAST:  58mL OMNIPAQUE IOHEXOL 350 MG/ML SOLN COMPARISON:  CT from earlier same day as well as multiple previous exams. FINDINGS: CT HEAD FINDINGS Brain: Previously identified left MCA territory infarcts not well visualized by CT. No acute intracranial hemorrhage. No new large vessel territory infarct. Atrophy with chronic small vessel ischemic disease. No mass lesion or midline shift. No hydrocephalus or extra-axial fluid collection. Vascular: No hyperdense vessel. Scattered vascular calcifications noted within the carotid siphons. Skull: Scalp soft tissues and calvarium demonstrate no new finding. Sinuses/Orbits: Globes orbital soft tissues demonstrate no acute finding. Paranasal sinuses remain largely clear. No significant mastoid effusion. Other: None. Review of the MIP images confirms the above findings CTA NECK FINDINGS Aortic arch: Visualized aortic arch normal caliber with standard branch pattern. Mild atheromatous change about the arch and origin the great vessels without significant stenosis. Right carotid system: Right common and internal carotid arteries patent without dissection or occlusion. Atheromatous change about the right carotid bulb  without hemodynamically significant greater than 50% stenosis. Left carotid system: Left common and internal carotid arteries patent without dissection or occlusion. Atheromatous change about the left carotid bulb without hemodynamically significant stenosis. Vertebral arteries: Both vertebral arteries arise from the subclavian arteries. No  proximal subclavian artery stenosis. Both vertebral arteries widely patent without stenosis, dissection or occlusion. Skeleton: No discrete or worrisome osseous lesions. Mild spondylosis for age. Other neck: No other acute soft tissue abnormality within the neck. Upper chest: Visualized upper chest demonstrates no acute finding. Review of the MIP images confirms the above findings CTA HEAD FINDINGS Anterior circulation: Mild atheromatous change within the carotid siphons without stenosis. A1 segments patent. Normal anterior communicating complex. Anterior cerebral arteries widely patent. No M1 stenosis or occlusion. Distal right MCA branches well perfused. Acute left M2 occlusion, inferior division (series 14, image 21). Posterior circulation: Both V4 segments widely patent. Right PICA patent. Left PICA not well seen. Basilar patent without stenosis. Superior cerebellar and posterior cerebral arteries patent bilaterally. Venous sinuses: Grossly patent allowing for timing the contrast bolus. Anatomic variants: None significant.  No aneurysm. Review of the MIP images confirms the above findings IMPRESSION: CT HEAD IMPRESSION: 1. Previously identified left MCA territory infarcts not well visualized by CT. No hemorrhagic transformation or other complication. 2. No other new acute intracranial abnormality. 3. Underlying atrophy with chronic small vessel ischemic disease. CTA HEAD AND NECK IMPRESSION: 1. Acute left M2 occlusion, inferior division. 2. Otherwise wide patency of the major arterial vasculature of the head and neck. 3. Mild atheromatous change about the carotid bifurcations and carotid siphons without hemodynamically significant stenosis. Electronically Signed   By: Jeannine Boga M.D.   On: 04/17/2022 20:20   ECHOCARDIOGRAM COMPLETE  Result Date: 04/17/2022    ECHOCARDIOGRAM REPORT   Patient Name:   Autumn Allen [facility] Date of Exam: 04/17/2022 Medical Rec #:  [id number]          Height:       65.0 in  Accession #:    [id number]         Weight:       180.0 lb Date of Birth:  May 04, 1930          BSA:          1.892 m Patient Age:    88 years           BP:           121/41 mmHg Patient Gender: F                  HR:           50 bpm. Exam Location:  Inpatient Procedure: 2D Echo, Color Doppler and Cardiac Doppler Indications:    TIA  History:        Patient has prior history of Echocardiogram examinations, most                 recent 08/17/2018. Signs/Symptoms:Murmur; Risk                 Factors:Hypertension, Dyslipidemia and Sleep Apnea.  Sonographer:    Johny Chess RDCS Referring Phys: [place]  1. Left ventricular ejection fraction, by estimation, is 70 to 75%. The left ventricle has hyperdynamic function. The left ventricle has no regional wall motion abnormalities. There is mild left ventricular hypertrophy. Left ventricular diastolic parameters are consistent with Grade I diastolic dysfunction (impaired relaxation).  2. Right ventricular systolic function is normal. The right ventricular  size is normal. Tricuspid regurgitation signal is inadequate for assessing PA pressure.  3. The mitral valve is degenerative. Trivial mitral valve regurgitation. No evidence of mitral stenosis.  4. The aortic valve is abnormal. There is severe calcifcation of the aortic valve. Aortic valve regurgitation is trivial. Moderate aortic valve stenosis. Aortic valve mean gradient measures 30.0 mmHg. Aortic valve Vmax measures 3.61 m/s. Hyperdynamic function and elevated LVOT TVI impact calculation of valve area and dimensionless index.  5. The inferior vena cava is normal in size with greater than 50% respiratory variability, suggesting right atrial pressure of 3 mmHg. FINDINGS  Left Ventricle: Left ventricular ejection fraction, by estimation, is 70 to 75%. The left ventricle has hyperdynamic function. The left ventricle has no regional wall motion abnormalities. The left ventricular internal cavity size  was normal in size. There is mild left ventricular hypertrophy. Left ventricular diastolic parameters are consistent with Grade I diastolic dysfunction (impaired relaxation). Right Ventricle: The right ventricular size is normal. No increase in right ventricular wall thickness. Right ventricular systolic function is normal. Tricuspid regurgitation signal is inadequate for assessing PA pressure. Left Atrium: Left atrial size was normal in size. Right Atrium: Right atrial size was normal in size. Pericardium: Trivial pericardial effusion is present. Presence of epicardial fat layer. Mitral Valve: The mitral valve is degenerative in appearance. Mild mitral annular calcification. Trivial mitral valve regurgitation. No evidence of mitral valve stenosis. The mean mitral valve gradient is 2.2 mmHg with average heart rate of 49 bpm. Tricuspid Valve: The tricuspid valve is normal in structure. Tricuspid valve regurgitation is trivial. No evidence of tricuspid stenosis. Aortic Valve: The aortic valve is abnormal. There is severe calcifcation of the aortic valve. Aortic valve regurgitation is trivial. Moderate aortic stenosis is present. Aortic valve mean gradient measures 30.0 mmHg. Aortic valve peak gradient measures 52.1 mmHg. Aortic valve area, by VTI measures 1.06 cm. Pulmonic Valve: The pulmonic valve was normal in structure. Pulmonic valve regurgitation is trivial. No evidence of pulmonic stenosis. Aorta: The aortic root is normal in size and structure. Venous: The inferior vena cava is normal in size with greater than 50% respiratory variability, suggesting right atrial pressure of 3 mmHg. IAS/Shunts: No atrial level shunt detected by color flow Doppler.  LEFT VENTRICLE PLAX 2D LVIDd:         4.30 cm   Diastology LVIDs:         2.10 cm   LV e' medial:    6.53 cm/s LV PW:         1.20 cm   LV E/e' medial:  17.5 LV IVS:        1.20 cm   LV e' lateral:   6.74 cm/s LVOT diam:     1.80 cm   LV E/e' lateral: 16.9 LV SV:          99 LV SV Index:   52 LVOT Area:     2.54 cm  RIGHT VENTRICLE             IVC RV S prime:     13.50 cm/s  IVC diam: 1.50 cm TAPSE (M-mode): 2.8 cm LEFT ATRIUM             Index        RIGHT ATRIUM           Index LA diam:        4.30 cm 2.27 cm/m   RA Area:     14.30 cm LA Vol (A2C):  49.5 ml 26.17 ml/m  RA Volume:   31.40 ml  16.60 ml/m LA Vol (A4C):   47.1 ml 24.90 ml/m LA Biplane Vol: 48.6 ml 25.69 ml/m  AORTIC VALVE AV Area (Vmax):    1.02 cm AV Area (Vmean):   0.96 cm AV Area (VTI):     1.06 cm AV Vmax:           361.00 cm/s AV Vmean:          254.000 cm/s AV VTI:            0.929 m AV Peak Grad:      52.1 mmHg AV Mean Grad:      30.0 mmHg LVOT Vmax:         144.00 cm/s LVOT Vmean:        95.700 cm/s LVOT VTI:          0.388 m LVOT/AV VTI ratio: 0.42  AORTA Ao Root diam: 3.00 cm Ao Asc diam:  3.10 cm MITRAL VALVE MV Area (PHT): 2.77 cm     SHUNTS MV Mean grad:  2.2 mmHg     Systemic VTI:  0.39 m MV Decel Time: 274 msec     Systemic Diam: 1.80 cm MV E velocity: 114.00 cm/s MV A velocity: 132.00 cm/s MV E/A ratio:  0.86 Cherlynn Kaiser MD Electronically signed by Cherlynn Kaiser MD Signature Date/Time: 04/17/2022/3:35:05 PM    Final     PHYSICAL EXAM  Temp:  [97.5 F (36.4 C)-99.1 F (37.3 C)] 98 F (36.7 C) (11/05 1256) Pulse Rate:  [42-55] 42 (11/05 1256) Resp:  [16-20] 16 (11/05 0723) BP: (121-158)/(41-57) 146/45 (11/05 1256) SpO2:  [95 %-100 %] 96 % (11/05 1256)  General - Well nourished, well developed pleasant elderly Caucasian lady, in no apparent distress. Cardiovascular - Regular rhythm and rate.  Mental Status -  Level of arousal and orientation to time, place, and person were intact. Language including expression, naming, repetition, comprehension was assessed and found intact.  Very occasional word finding difficulties with speech is mostly fluent without aphasia, apraxia or dysarthria Attention span and concentration were normal. Recent and remote memory were  intact. Fund of Knowledge was assessed and was intact.  Cranial Nerves II - XII - II - Visual field intact OU. III, IV, VI - Extraocular movements intact. V - Facial sensation intact bilaterally. VII - Facial movement intact bilaterally. VIII - Hearing & vestibular intact bilaterally. X - Palate elevates symmetrically. XI - Chin turning & shoulder shrug intact bilaterally. XII - Tongue protrusion intact.  Motor Strength - The patient's strength was normal in all extremities and pronator drift was absent.  Bulk was normal and fasciculations were absent.   Motor Tone - Muscle tone was assessed at the neck and appendages and was normal.  Sensory - Light touch, temperature/pinprick were assessed and were symmetrical.    Coordination - The patient had normal movements in the hands and feet with no ataxia or dysmetria.  Tremor was absent.  Gait and Station - deferred.  ASSESSMENT/PLAN Ms. Autumn OSHIELDS is a 88 y.o. female with history of mild aortic stenosis, hypertension, hyperlipidemia, obstructive sleep apnea on CPAP, prior history of TIA 2016 without residual deficits presented to the emergency room for episode of confusion and word finding difficulty.   Stroke Acute Left MCA ischemic infarcts Etiology:  likely cardio embolic   CT head 1. No acute intracranial abnormality. 2. Unchanged age related atrophy and mild chronic small vessel ischemia. CTA head &  neck pending  MRI   1. Scattered patchy acute ischemic nonhemorrhagic left MCA distribution infarcts involving the left insula and adjacent left temporal region. No associated hemorrhage or significant mass effect. 2. Underlying mild chronic microvascular ischemic disease 2D Echo EF 70-75%. left ventricle has hyperdynamic function. mild left ventricular hypertrophy. Left ventricular diastolic parameters are consistent with Grade I diastolic dysfunction   LDL 115 HgbA1c 6.1 VTE prophylaxis - Lovenox    Diet   Diet Heart  Room service appropriate? Yes; Fluid consistency: Thin   aspirin 81 mg daily prior to admission, now on aspirin 81 mg daily and clopidogrel 75 mg daily. For 3 weeks than Plavix alone  Therapy recommendations: No PT follow-up disposition: Home Hypertension Home meds:  none Stable Permissive hypertension (OK if < 220/120) but gradually normalize in 5-7 days Long-term BP goal normotensive  Hyperlipidemia Home meds:  atorvastatin 20mg , increased to 40 mgresumed in hospital LDL 115, goal < 70 Continue statin at discharge  Other Stroke Risk Factors Advanced Age >/= 69  Hx stroke/TIA Coronary artery disease Obstructive sleep apnea, on CPAP at home  Other Active Problems Anxiety/depression hypothyroidism  Hospital day # 0  Patient presented with aphasia and word finding difficulties which appear to have resolved and MRI scan shows embolic left MCA branch infarct likely of cryptogenic etiology.  Recommend continue aspirin and Plavix for 3 weeks followed by Plavix alone and patient will need outpatient cardiac monitoring with 30-day heart monitor after discharge.  Aggressive risk factor modification.  Discussed with patient , her daughter and Dr. Justice Rocher.  Greater than 50% time during this 35-minute visit was spent in counseling and coordination of care about her cryptogenic stroke and discussion about evaluation and treatment and prevention of stroke and answering questions.  Follow-up as an outpatient stroke clinic with nurse practitioner 2 months or call earlier if necessary  Antony Contras, [place] Pager: [phone] 04/18/2022 1:37 PM  To contact Stroke Continuity provider, please refer to [email or web address] After hours, contact General Neurology

## 2022-04-18 NOTE — Discharge Instructions (Signed)
Please take aspirin and plavix for 3 week, then plavix alone.

## 2022-04-18 NOTE — Inpatient Diabetes Management (Signed)
Inpatient Diabetes Program Recommendations  AACE/ADA: New Consensus Statement on Inpatient Glycemic Control (2015)  Target Ranges:  Prepandial:   less than 140 mg/dL      Peak postprandial:   less than 180 mg/dL (1-2 hours)      Critically ill patients:  140 - 180 mg/dL   Lab Results  Component Value Date   GLUCAP 185 (H) 04/17/2022   HGBA1C 6.4 (H) 04/17/2022    Diabetes history: New Onset Pre diabetes Received consult regarding education for prediabetes for patient and daughter. Called and spoke with daughter per patient request. DM coordinator working remotely on call for system. Reviewed basic nutrition information and discussed some alternatives to patient's diet for less sugar and decreased high carbohydrate foods. [facility] a [facility] center so eats dinner at the facility and also has food for snacks and meals on her own. Daughter to review Living Well With Diabetes @ home and discuss with patient. Patient has short term memory loss and also is hard of hearing. Daughter appreciative of information and expect good compliance for patient with her daughter's assistance.  Thank you, Autumn Allen. Autumn Bjelland, RN, MSN, CDE  Diabetes Coordinator Inpatient Glycemic Control Team Team Pager [phone] (8am-5pm) 04/18/2022 1:56 PM

## 2022-04-18 NOTE — Evaluation (Signed)
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   Psychophysiological insomnia 02/12/2020   Nonspecific abnormal electrocardiogram (ECG) (EKG) 02/12/2020   Sleep related hypoxia 02/12/2020   Grief at loss of child 05/16/2019   Adjustment insomnia 05/16/2019   Atrophic vaginitis 08/21/2018   Menopausal syndrome 08/21/2018   Urinary tract infectious disease 08/21/2018   Sensorineural hearing loss (SNHL), bilateral 12/28/2017   Bilateral impacted cerumen 10/22/2016   Hypothyroidism 04/20/2015   Depression 04/20/2015   TIA (transient ischemic attack)    Essential hypertension    Hyperlipidemia    OSA (obstructive sleep apnea)    Gait disturbance 06/10/2014   MURMUR 08/14/2010   CHEST PAIN 08/14/2010   Past Medical History:  Past Medical History:  Diagnosis Date   Allergy    Aortic stenosis    a. mild to mod by Echo 07/2012   Arthritis    Asymptomatic varicose veins    Benign paroxysmal vertigo    Bradycardia, unspecified    Bronchitis, not specified as acute or chronic    Cardiac murmur    Cataracts, bilateral    Chest pain, unspecified    Chronic cystitis    Depression    Diarrhea    Dyspnea    Dysuria    Frequency of micturition    Frequent urination    Gait disturbance 06/10/2014   Gastric ulcer    Hearing loss    Hemorrhoids    History of bone density study    History of colonoscopy    History of frequent urinary tract infections    History of urinary tract infection    Hx of cardiovascular stress test     a. ETT-Echo 3/12:  EF 60%, normal study   Hx of echocardiogram    a. Echo 2/14:  Mild LVH, EF 60-65%, Gr 1 diast dysfn, mild to mod AS, mean 17 mmHg, AVA 1.3 (VTI), trivial MR, mild LAE, PASP 31    Hyperlipemia    Hypertension    Hypothyroid    Left knee pain    Leg pain    Low back pain    Obstructive sleep apnea    OSA on CPAP    Other disorders of intestinal carbohydrate absorption    Other type of osteoarthritis, unspecified site    Overweight    Pain in thoracic spine    Palpitations    a. event monitor 3/14:  NSR, sinus brady   Plantar fasciitis    Plantar fasciitis    Right shoulder pain    S/P cholecystectomy    S/P TAH-BSO    Sleep apnea    Stroke ([place])    tia 2016   TIA (transient ischemic attack)    Varicose veins of unspecified lower extremity with inflammation    Past Surgical History:  Past Surgical History:  Procedure Laterality Date   ABDOMINAL HYSTERECTOMY     CHOLECYSTECTOMY     EP IMPLANTABLE DEVICE N/A 06/29/2016   Procedure: Loop Recorder Insertion;  Surgeon: Will Meredith Leeds, MD;  Location: [facility];  Service: Cardiovascular;  Laterality: N/A;  FOOT SURGERY  1955   INCONTINENCE SURGERY     LOOP RECORDER REMOVAL N/A 08/10/2017   Procedure: LOOP RECORDER REMOVAL;  Surgeon: Regan Lemming, MD;  Location: [facility];  Service: Cardiovascular;  Laterality: N/A;   TONSILLECTOMY     HPI:  Pt is 88 yo female who presents on 11/3 with confusion and word finding difficulty. Symptoms resolved but pt also noted to be bradycardic. 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 PMH: TIA in 2016, mild aortic stenosis, hypertension, hyperlipidemia, obstructive sleep apnea on CPAP, BPPV   Assessment / Plan / Recommendation Clinical Impression  Autumn Allen presents with baseline short-term memory deficits that have been exacerbated with new left CVA. Her aphasia has improved dramatically, but she continues to have mild  word-retrieval deficits for more complex naming tasks.  Higher-level attention deficits are present.  She had difficulty recalling/retrieving names of meds, impaired oral reading with phonemic paraphasias. She demonstrates intellectual awareness that she has not achieved baseline function.   Her daughter, Autumn Allen, was present - she lives out of town but will be able to stay with her mother for a few days. Recommend HH SLP and intermittent supervision upon D/C - she may particularly need help with her medicine schedule.    SLP Assessment  SLP Recommendation/Assessment: Patient needs continued Speech Lanaguage Pathology Services SLP Visit Diagnosis: Cognitive communication deficit (R41.841)    Recommendations for follow up therapy are one component of a multi-disciplinary discharge planning process, led by the attending physician.  Recommendations may be updated based on patient status, additional functional criteria and insurance authorization.    Follow Up Recommendations  Home health SLP    Assistance Recommended at Discharge  Intermittent Supervision/Assistance  Functional Status Assessment Patient has had a recent decline in their functional status and demonstrates the ability to make significant improvements in function in a reasonable and predictable amount of time.  Frequency and Duration           SLP Evaluation Cognition  Overall Cognitive Status: Impaired/Different from baseline Arousal/Alertness: Awake/alert Orientation Level: Oriented X4 Attention: Alternating Alternating Attention: Impaired Alternating Attention Impairment: Verbal basic Memory: Impaired Memory Impairment: Retrieval deficit Awareness: Impaired Awareness Impairment: Emergent impairment Behaviors: Impulsive       Comprehension  Auditory Comprehension Overall Auditory Comprehension: Appears within functional limits for tasks assessed Yes/No Questions: Within Functional Limits Commands: Within Functional  Limits Visual Recognition/Discrimination Discrimination: Within Function Limits Reading Comprehension Reading Status: Impaired Paragraph Level: Impaired    Expression Expression Primary Mode of Expression: Verbal Verbal Expression Overall Verbal Expression: Impaired Initiation: No impairment Automatic Speech: Social Response Level of Generative/Spontaneous Verbalization: Conversation Repetition: No impairment Naming: Impairment Responsive: 76-100% accurate Confrontation: Within functional limits Convergent: Not tested Divergent: 50-74% accurate Verbal Errors: Phonemic paraphasias Pragmatics: No impairment   Oral / Motor  Oral Motor/Sensory Function Overall Oral Motor/Sensory Function: Within functional limits Motor Speech Overall Motor Speech: Appears within functional limits for tasks assessed            Blenda Mounts Laurice 04/18/2022, 1:54 PM Zayan Delvecchio L. Samson Frederic, MA CCC/SLP Clinical Specialist - Acute Care SLP Acute Rehabilitation Services Office number [phone]

## 2022-04-18 NOTE — Progress Notes (Signed)
Discharge instructions provided to patient. All medictions, follow up appointments, and discharge instructions provided. IV out. Monitor off CCMD notified. Discharging home.

## 2022-04-18 NOTE — Progress Notes (Signed)
Rounding Note    Patient Name: Autumn Allen Date of Encounter: 04/18/2022  [place] Cardiologist: Will Meredith Leeds, MD   Subjective   Denies any chest pain, dyspnea, or lightheadedness  Inpatient Medications    Scheduled Meds:  aspirin EC  81 mg Oral Daily   atorvastatin  40 mg Oral Daily   cholecalciferol  1,000 Units Oral Daily   clopidogrel  75 mg Oral Daily   [START ON 04/19/2022] conjugated estrogens  1 Applicatorful Vaginal Once per day on Mon Thu   enoxaparin (LOVENOX) injection  40 mg Subcutaneous Q24H   escitalopram  10 mg Oral QHS   levothyroxine  50 mcg Oral QPM   methenamine  1 g Oral TID AC & HS   Continuous Infusions:  sodium chloride Stopped (04/17/22 2254)   PRN Meds: acetaminophen **OR** acetaminophen (TYLENOL) oral liquid 160 mg/5 mL **OR** acetaminophen, acetaminophen, senna-docusate   Vital Signs    Vitals:   04/17/22 1628 04/17/22 2022 04/18/22 0006 04/18/22 0723  BP: (!) 151/53 (!) 158/56 (!) 149/45 (!) 142/57  Pulse: (!) 45 (!) 51 (!) 47 (!) 48  Resp: 18 20 18 16   Temp: 99.1 F (37.3 C) (!) 97.5 F (36.4 C) 97.8 F (36.6 C) 98.4 F (36.9 C)  TempSrc: Oral Axillary Axillary Oral  SpO2: 95% 95% 97% 100%  Weight:      Height:        Intake/Output Summary (Last 24 hours) at 04/18/2022 1210 Last data filed at 04/18/2022 0410 Gross per 24 hour  Intake 700.92 ml  Output --  Net 700.92 ml      04/16/2022    5:00 PM 12/08/2020    3:46 PM 12/05/2020    2:44 PM  Last 3 Weights  Weight (lbs) 180 lb 184 lb 187 lb  Weight (kg) 81.647 kg 83.462 kg 84.823 kg      Telemetry    Sinus bradycardia, currently 40-50s - Personally Reviewed  ECG    No new ECG - Personally Reviewed  Physical Exam   GEN: No acute distress.   Neck: No JVD Cardiac: bradycardic, regular, 36 systolic murmur Respiratory: Clear to auscultation bilaterally. GI: Soft, nontender, non-distended  MS: No edema; No deformity. Neuro:  Nonfocal   Psych: Normal affect   Labs    High Sensitivity Troponin:  No results for input(s): "TROPONINIHS" in the last 720 hours.   Chemistry Recent Labs  Lab 04/16/22 1807 04/17/22 0849  NA 139 138  K 4.2 4.6  CL 100 101  CO2 30 25  GLUCOSE 98 175*  BUN 17 15  CREATININE 0.80 0.86  CALCIUM 9.5 8.9  MG  --  1.8  PROT 6.9  --   ALBUMIN 3.8  --   AST 22  --   ALT 18  --   ALKPHOS 46  --   BILITOT 0.7  --   GFRNONAA >60 >60  ANIONGAP 9 12    Lipids  Recent Labs  Lab 04/17/22 0500  CHOL 181  TRIG 94  HDL 47  LDLCALC 115*  CHOLHDL 3.9    Hematology Recent Labs  Lab 04/16/22 1807 04/17/22 0849  WBC 8.5 5.7  RBC 4.53 4.49  HGB 15.0 14.9  HCT 46.4* 44.7  MCV 102.4* 99.6  MCH 33.1 33.2  MCHC 32.3 33.3  RDW 12.2 11.9  PLT 222 207   Thyroid  Recent Labs  Lab 04/17/22 0849  TSH 1.304    BNPNo results for input(s): "BNP", "PROBNP"  in the last 168 hours.  DDimer No results for input(s): "DDIMER" in the last 168 hours.   Radiology    CT ANGIO HEAD NECK W WO CM  Result Date: 04/17/2022 CLINICAL DATA:  Follow-up examination for stroke. EXAM: CT ANGIOGRAPHY HEAD AND NECK TECHNIQUE: Multidetector CT imaging of the head and neck was performed using the standard protocol during bolus administration of intravenous contrast. Multiplanar CT image reconstructions and MIPs were obtained to evaluate the vascular anatomy. Carotid stenosis measurements (when applicable) are obtained utilizing NASCET criteria, using the distal internal carotid diameter as the denominator. RADIATION DOSE REDUCTION: This exam was performed according to the departmental dose-optimization program which includes automated exposure control, adjustment of the mA and/or kV according to patient size and/or use of iterative reconstruction technique. CONTRAST:  44mL OMNIPAQUE IOHEXOL 350 MG/ML SOLN COMPARISON:  CT from earlier same day as well as multiple previous exams. FINDINGS: CT HEAD FINDINGS Brain: Previously  identified left MCA territory infarcts not well visualized by CT. No acute intracranial hemorrhage. No new large vessel territory infarct. Atrophy with chronic small vessel ischemic disease. No mass lesion or midline shift. No hydrocephalus or extra-axial fluid collection. Vascular: No hyperdense vessel. Scattered vascular calcifications noted within the carotid siphons. Skull: Scalp soft tissues and calvarium demonstrate no new finding. Sinuses/Orbits: Globes orbital soft tissues demonstrate no acute finding. Paranasal sinuses remain largely clear. No significant mastoid effusion. Other: None. Review of the MIP images confirms the above findings CTA NECK FINDINGS Aortic arch: Visualized aortic arch normal caliber with standard branch pattern. Mild atheromatous change about the arch and origin the great vessels without significant stenosis. Right carotid system: Right common and internal carotid arteries patent without dissection or occlusion. Atheromatous change about the right carotid bulb without hemodynamically significant greater than 50% stenosis. Left carotid system: Left common and internal carotid arteries patent without dissection or occlusion. Atheromatous change about the left carotid bulb without hemodynamically significant stenosis. Vertebral arteries: Both vertebral arteries arise from the subclavian arteries. No proximal subclavian artery stenosis. Both vertebral arteries widely patent without stenosis, dissection or occlusion. Skeleton: No discrete or worrisome osseous lesions. Mild spondylosis for age. Other neck: No other acute soft tissue abnormality within the neck. Upper chest: Visualized upper chest demonstrates no acute finding. Review of the MIP images confirms the above findings CTA HEAD FINDINGS Anterior circulation: Mild atheromatous change within the carotid siphons without stenosis. A1 segments patent. Normal anterior communicating complex. Anterior cerebral arteries widely patent. No  M1 stenosis or occlusion. Distal right MCA branches well perfused. Acute left M2 occlusion, inferior division (series 14, image 21). Posterior circulation: Both V4 segments widely patent. Right PICA patent. Left PICA not well seen. Basilar patent without stenosis. Superior cerebellar and posterior cerebral arteries patent bilaterally. Venous sinuses: Grossly patent allowing for timing the contrast bolus. Anatomic variants: None significant.  No aneurysm. Review of the MIP images confirms the above findings IMPRESSION: CT HEAD IMPRESSION: 1. Previously identified left MCA territory infarcts not well visualized by CT. No hemorrhagic transformation or other complication. 2. No other new acute intracranial abnormality. 3. Underlying atrophy with chronic small vessel ischemic disease. CTA HEAD AND NECK IMPRESSION: 1. Acute left M2 occlusion, inferior division. 2. Otherwise wide patency of the major arterial vasculature of the head and neck. 3. Mild atheromatous change about the carotid bifurcations and carotid siphons without hemodynamically significant stenosis. Electronically Signed   By: Jeannine Boga M.D.   On: 04/17/2022 20:20   ECHOCARDIOGRAM COMPLETE  Result Date: 04/17/2022  ECHOCARDIOGRAM REPORT   Patient Name:   Autumn Allen [facility] Date of Exam: 04/17/2022 Medical Rec #:  [id number]          Height:       65.0 in Accession #:    [id number]         Weight:       180.0 lb Date of Birth:  30-Sep-1929          BSA:          1.892 m Patient Age:    40 years           BP:           121/41 mmHg Patient Gender: F                  HR:           50 bpm. Exam Location:  Inpatient Procedure: 2D Echo, Color Doppler and Cardiac Doppler Indications:    TIA  History:        Patient has prior history of Echocardiogram examinations, most                 recent 08/17/2018. Signs/Symptoms:Murmur; Risk                 Factors:Hypertension, Dyslipidemia and Sleep Apnea.  Sonographer:    Johny Chess RDCS Referring Phys:  [place]  1. Left ventricular ejection fraction, by estimation, is 70 to 75%. The left ventricle has hyperdynamic function. The left ventricle has no regional wall motion abnormalities. There is mild left ventricular hypertrophy. Left ventricular diastolic parameters are consistent with Grade I diastolic dysfunction (impaired relaxation).  2. Right ventricular systolic function is normal. The right ventricular size is normal. Tricuspid regurgitation signal is inadequate for assessing PA pressure.  3. The mitral valve is degenerative. Trivial mitral valve regurgitation. No evidence of mitral stenosis.  4. The aortic valve is abnormal. There is severe calcifcation of the aortic valve. Aortic valve regurgitation is trivial. Moderate aortic valve stenosis. Aortic valve mean gradient measures 30.0 mmHg. Aortic valve Vmax measures 3.61 m/s. Hyperdynamic function and elevated LVOT TVI impact calculation of valve area and dimensionless index.  5. The inferior vena cava is normal in size with greater than 50% respiratory variability, suggesting right atrial pressure of 3 mmHg. FINDINGS  Left Ventricle: Left ventricular ejection fraction, by estimation, is 70 to 75%. The left ventricle has hyperdynamic function. The left ventricle has no regional wall motion abnormalities. The left ventricular internal cavity size was normal in size. There is mild left ventricular hypertrophy. Left ventricular diastolic parameters are consistent with Grade I diastolic dysfunction (impaired relaxation). Right Ventricle: The right ventricular size is normal. No increase in right ventricular wall thickness. Right ventricular systolic function is normal. Tricuspid regurgitation signal is inadequate for assessing PA pressure. Left Atrium: Left atrial size was normal in size. Right Atrium: Right atrial size was normal in size. Pericardium: Trivial pericardial effusion is present. Presence of epicardial fat layer. Mitral  Valve: The mitral valve is degenerative in appearance. Mild mitral annular calcification. Trivial mitral valve regurgitation. No evidence of mitral valve stenosis. The mean mitral valve gradient is 2.2 mmHg with average heart rate of 49 bpm. Tricuspid Valve: The tricuspid valve is normal in structure. Tricuspid valve regurgitation is trivial. No evidence of tricuspid stenosis. Aortic Valve: The aortic valve is abnormal. There is severe calcifcation of the aortic valve. Aortic valve regurgitation is trivial. Moderate aortic stenosis is present.  Aortic valve mean gradient measures 30.0 mmHg. Aortic valve peak gradient measures 52.1 mmHg. Aortic valve area, by VTI measures 1.06 cm. Pulmonic Valve: The pulmonic valve was normal in structure. Pulmonic valve regurgitation is trivial. No evidence of pulmonic stenosis. Aorta: The aortic root is normal in size and structure. Venous: The inferior vena cava is normal in size with greater than 50% respiratory variability, suggesting right atrial pressure of 3 mmHg. IAS/Shunts: No atrial level shunt detected by color flow Doppler.  LEFT VENTRICLE PLAX 2D LVIDd:         4.30 cm   Diastology LVIDs:         2.10 cm   LV e' medial:    6.53 cm/s LV PW:         1.20 cm   LV E/e' medial:  17.5 LV IVS:        1.20 cm   LV e' lateral:   6.74 cm/s LVOT diam:     1.80 cm   LV E/e' lateral: 16.9 LV SV:         99 LV SV Index:   52 LVOT Area:     2.54 cm  RIGHT VENTRICLE             IVC RV S prime:     13.50 cm/s  IVC diam: 1.50 cm TAPSE (M-mode): 2.8 cm LEFT ATRIUM             Index        RIGHT ATRIUM           Index LA diam:        4.30 cm 2.27 cm/m   RA Area:     14.30 cm LA Vol (A2C):   49.5 ml 26.17 ml/m  RA Volume:   31.40 ml  16.60 ml/m LA Vol (A4C):   47.1 ml 24.90 ml/m LA Biplane Vol: 48.6 ml 25.69 ml/m  AORTIC VALVE AV Area (Vmax):    1.02 cm AV Area (Vmean):   0.96 cm AV Area (VTI):     1.06 cm AV Vmax:           361.00 cm/s AV Vmean:          254.000 cm/s AV VTI:             0.929 m AV Peak Grad:      52.1 mmHg AV Mean Grad:      30.0 mmHg LVOT Vmax:         144.00 cm/s LVOT Vmean:        95.700 cm/s LVOT VTI:          0.388 m LVOT/AV VTI ratio: 0.42  AORTA Ao Root diam: 3.00 cm Ao Asc diam:  3.10 cm MITRAL VALVE MV Area (PHT): 2.77 cm     SHUNTS MV Mean grad:  2.2 mmHg     Systemic VTI:  0.39 m MV Decel Time: 274 msec     Systemic Diam: 1.80 cm MV E velocity: 114.00 cm/s MV A velocity: 132.00 cm/s MV E/A ratio:  0.86 Cherlynn Kaiser MD Electronically signed by Cherlynn Kaiser MD Signature Date/Time: 04/17/2022/3:35:05 PM    Final    CT HEAD WO CONTRAST (5MM)  Result Date: 04/17/2022 CLINICAL DATA:  88 year old female with altered mental status. EXAM: CT HEAD WITHOUT CONTRAST TECHNIQUE: Contiguous axial images were obtained from the base of the skull through the vertex without intravenous contrast. RADIATION DOSE REDUCTION: This exam was performed according to the departmental dose-optimization program  which includes automated exposure control, adjustment of the mA and/or kV according to patient size and/or use of iterative reconstruction technique. COMPARISON:  04/16/2022 CT, MRI and prior study FINDINGS: Brain: Known small scattered patchy LEFT MCA infarcts identified on 04/16/2022 MR are not well visualized on this CT. No new infarct or hemorrhage identified. There is no evidence of mass lesion, mass effect, midline shift, hydrocephalus or extra-axial collection. Atrophy and chronic small-vessel white matter ischemic changes again noted. Vascular: No new abnormalities identified. Carotid atherosclerotic calcifications are present. Skull: Normal. Negative for fracture or focal lesion. Sinuses/Orbits: No acute finding. Other: None. IMPRESSION: 1. No evidence of new intracranial abnormality. Known small scattered patchy LEFT MCA infarcts identified on 04/16/2022 MR are not well visualized on this CT. 2. Atrophy and chronic small-vessel white matter ischemic changes.  Electronically Signed   By: Margarette Canada M.D.   On: 04/17/2022 13:56   [name] CONTRAST  Result Date: 04/17/2022 CLINICAL DATA:  Initial evaluation for acute TIA. EXAM: MRI HEAD WITHOUT CONTRAST TECHNIQUE: Multiplanar, multiecho pulse sequences of the brain and surrounding structures were obtained without intravenous contrast. COMPARISON:  CT from earlier the same day. FINDINGS: Brain: Cerebral volume within normal limits. Scattered patchy T2/FLAIR hyperintensity involving the periventricular and deep white matter, consistent with chronic small vessel ischemic disease, mild for age. Scattered patchy foci of restricted diffusion are seen involving the left insula and adjacent mid and posterior left temporal region, consistent with acute left MCA distribution infarcts. Involvement of the left periatrial white matter. No associated hemorrhage or significant mass effect. Otherwise, gray-white matter differentiation maintained. No acute intracranial hemorrhage. No visible significant chronic intracranial blood products. No mass lesion, midline shift or mass effect. No hydrocephalus or extra-axial fluid collection. Pituitary gland and suprasellar region within normal limits. Vascular: Major intracranial vascular flow voids are maintained. Skull and upper cervical spine: Craniocervical junction normal. Bone marrow signal intensity within normal limits. No scalp soft tissue abnormality. Sinuses/Orbits: Globes orbital soft tissues demonstrate no acute finding. Prior bilateral ocular lens replacement. Scattered mucosal thickening noted about the ethmoidal air cells. Small right mastoid effusion, of doubtful significance. Other: None. IMPRESSION: 1. Scattered patchy acute ischemic nonhemorrhagic left MCA distribution infarcts involving the left insula and adjacent left temporal region. No associated hemorrhage or significant mass effect. 2. Underlying mild chronic microvascular ischemic disease. Electronically Signed    By: Jeannine Boga M.D.   On: 04/17/2022 00:18   CT HEAD WO CONTRAST (5MM)  Result Date: 04/16/2022 CLINICAL DATA:  Neuro deficit, acute, stroke suspected EXAM: CT HEAD WITHOUT CONTRAST TECHNIQUE: Contiguous axial images were obtained from the base of the skull through the vertex without intravenous contrast. RADIATION DOSE REDUCTION: This exam was performed according to the departmental dose-optimization program which includes automated exposure control, adjustment of the mA and/or kV according to patient size and/or use of iterative reconstruction technique. COMPARISON:  06/18/2019 FINDINGS: Brain: No intracranial hemorrhage, mass effect, or midline shift. Generalized atrophy is normal for age. No hydrocephalus. The basilar cisterns are patent. Mild periventricular and deep white matter hypodensity consistent with chronic small vessel ischemia, unchanged. No evidence of territorial infarct or acute ischemia. No extra-axial or intracranial fluid collection. Vascular: No hyperdense vessel or unexpected calcification. Skull: No fracture or focal lesion. Sinuses/Orbits: Mild mucosal thickening of ethmoid air cells. Remote right orbital fracture. Bilateral cataract resection. Other: None. IMPRESSION: 1. No acute intracranial abnormality. 2. Unchanged age related atrophy and mild chronic small vessel ischemia. Electronically Signed   By: Threasa Beards  [place] M.D.   On: 04/16/2022 19:40    Cardiac Studies     Patient Profile     88 y.o. female with a hx of mild aortic stenosis, HTN, HLD, OSA on CPAP, history of TIA in 2016, depression, hypothyroidism who is being seen 04/17/2022 for the evaluation of bradycardia   Assessment & Plan    Bradycardia: Patient presented following an episode of acute confusion. Found to have an acute ischemic stroke involving the left cerebral hemisphere: HR found to be in the 40s-50s on presentation. Patient not on any av-nodal blocking medications. Per review of vital signs  at recent office visits, it appears that patient's HR is usually in the low 50s.Echocardiogram shows EF 70 to 56%, grade 1 diastolic dysfunction, normal RV function, moderate aortic stenosis - No need for PPM at this time as patient is overall asymptomatic with low resting heart rate - Recommend cardiac monitor at DC given stroke -- note patient did have an implantable loop recorder from 06/2016 through 07/2017 without evidence of arrhythmia    Aortic stenosis -Moderate AS on echo this admission, will follow   HTN  - Neurology recommending permissive HTN   HLD  - Lipid panel this admission showed LDL 155, HDL 47, triglycerides 94  - PTA patient was taking lipitor 20 mg twice a week. Ordered lipitor 40 mg daily, titrate as tolerated (patient has intolerance to crestor)    Otherwise per primary  - Acute stroke-- on ASA, Plavix   - Bipolar disorder  - Hypothyroidism   [facility] will sign off.   Medication Recommendations:  No changes Other recommendations (labs, testing, etc):  30 day monitor on discharge Follow up as an outpatient:  Will schedule   For questions or updates, please contact [place] Please consult [email or web address] for contact info under        Signed, Donato Heinz, MD  04/18/2022, 12:10 PM

## 2022-04-18 NOTE — Evaluation (Signed)
Occupational Therapy Evaluation Patient Details Name: Autumn Allen MRN: [id number] DOB: 06-16-1929 Today's Date: 04/18/2022   History of Present Illness Pt is 88 yo female who presents on 11/3 with confusion and word finding difficulty. Symptoms resolved but pt also noted to be bradycardic. 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 PMH: TIA in 2016, mild aortic stenosis, hypertension, hyperlipidemia, obstructive sleep apnea on CPAP, BPPV   Clinical Impression   This 88 year old female admitted with above presents to acute OT with PLOF of living at independent living at [facility] using a rollator wen going out and about. Currently she is setup/S for al basic ADLs from RW level and dtr will be able to stay with her upon pt's discharge as well as HHOT has been recommended at ordered per CM. No further acute OT needs due to patient to D/C today. We will sign off.      Recommendations for follow up therapy are one component of a multi-disciplinary discharge planning process, led by the attending physician.  Recommendations may be updated based on patient status, additional functional criteria and insurance authorization.   Follow Up Recommendations  Home health OT    Assistance Recommended at Discharge PRN  Patient can return home with the following A little help with bathing/dressing/bathroom;Assistance with cooking/housework;Assist for transportation    Functional Status Assessment  Patient has had a recent decline in their functional status and demonstrates the ability to make significant improvements in function in a reasonable and predictable amount of time.  Equipment Recommendations  None recommended by OT       Precautions / Restrictions Precautions Precautions: Fall Precaution Comments: fell 2 days ago but reports she caught herself and lowered to the floor without hurting anything. Has balance deficits at baseline Restrictions Weight Bearing  Restrictions: No      Mobility Bed Mobility Overal bed mobility: Independent                  Transfers Overall transfer level: Needs assistance Equipment used: Rolling walker (2 wheels) Transfers: Sit to/from Stand Sit to Stand: Supervision                  Balance Overall balance assessment: History of Falls, Needs assistance Sitting-balance support: No upper extremity supported, Feet supported Sitting balance-Leahy Scale: Good     Standing balance support: No upper extremity supported, During functional activity Standing balance-Leahy Scale: Fair                             ADL either performed or assessed with clinical judgement   ADL Overall ADL's : Needs assistance/impaired Eating/Feeding: Independent;Sitting   Grooming: Supervision/safety;Set up;Standing   Upper Body Bathing: Set up;Supervision/ safety;Sitting   Lower Body Bathing: Sit to/from stand;Supervison/ safety   Upper Body Dressing : Set up;Supervision/safety;Sitting   Lower Body Dressing: Sit to/from stand;Supervision/safety   Toilet Transfer: Ambulation;Rolling walker (2 wheels);Supervision/safety   Toileting- Architect and Hygiene: Sit to/from stand;Supervision/safety         General ADL Comments: I discussed with pt and dtr that pt should use rollator at all times (in apartment and when out and about), use seat to sit on for showering (dtr and/or HHOT will try a "dry run" with her at home with built in seat she has and see if it is adequate and if not will get her a seat for shower--I recommended the 3n1 if it  will fit due to it having arms to help with sit<>stand)     Vision Patient Visual Report: No change from baseline              Pertinent Vitals/Pain Pain Assessment Pain Assessment: No/denies pain     Hand Dominance Right   Extremity/Trunk Assessment Upper Extremity Assessment Upper Extremity Assessment: Overall WFL for tasks assessed            Communication Communication Communication: No difficulties   Cognition Arousal/Alertness: Awake/alert Behavior During Therapy: WFL for tasks assessed/performed Overall Cognitive Status: Impaired/Different from baseline Area of Impairment: Orientation, Memory                 Orientation Level:  (had to correct herself on year; orginally said 2013)   Memory: Decreased short-term memory         General Comments: pt unable to tell me what medications she takes at home as well as unable at times to tell me if she took them if I named them from her medication list                Home Living Family/patient expects to be discharged to:: Private residence Living Arrangements: Alone Available Help at Discharge: Family (dtr will stay a few days in town at pt's D/C) Type of Home: Independent living facility Home Access: Level entry;Elevator     Home Layout: One level     Bathroom Shower/Tub: Walk-in Hydrologist: Handicapped height Bathroom Accessibility: Yes How Accessible: Accessible via walker Home Equipment: Rollator (4 wheels);Grab bars - toilet;Grab bars - tub/shower;Shower seat - built in   Additional Comments: pt's daughter lives at [facility] but comes up frequently  Lives With: Alone    Prior Functioning/Environment Prior Level of Function : Independent/Modified Independent             Mobility Comments: ambulates with rollator; furniture and wall walks in her apartment          OT Problem List: Impaired balance (sitting and/or standing);Decreased cognition         OT Goals(Current goals can be found in the care plan section) Acute Rehab OT Goals Patient Stated Goal: home today  OT Frequency: Min 2X/week       AM-PAC OT "6 Clicks" Daily Activity     Outcome Measure Help from another person eating meals?: None Help from another person taking care of personal grooming?: A Little Help from another person toileting,  which includes using toliet, bedpan, or urinal?: A Little Help from another person bathing (including washing, rinsing, drying)?: A Little Help from another person to put on and taking off regular upper body clothing?: A Little Help from another person to put on and taking off regular lower body clothing?: A Little 6 Click Score: 19   End of Session Equipment Utilized During Treatment: Gait belt;Rolling walker (2 wheels)  Activity Tolerance: Patient tolerated treatment well Patient left: in bed;with family/visitor present (sitting on EOB with dtr present in room)  OT Visit Diagnosis: Unsteadiness on feet (R26.81);Muscle weakness (generalized) (M62.81);Other symptoms and signs involving cognitive function                Time: 1191-4782 OT Time Calculation (min): 34 min Charges:  OT General Charges $OT Visit: 1 Visit OT Evaluation $OT Eval Moderate Complexity: 1 Mod OT Treatments $Self Care/Home Management : 8-22 mins  Autumn Allen, OTR/L Acute [facility] [phone] Office [phone]    Autumn Allen  Autumn Allen 04/18/2022, 2:47 PM

## 2022-04-18 NOTE — Discharge Summary (Addendum)
Physician Discharge Summary   Patient: Autumn BridgemanKarlyn S Allen MRN: [id number] DOB: [birth year]  Admit date:     04/16/2022  Discharge date: 04/18/22  Discharge Physician: Alba CoryBelkys A Jenet Durio   PCP: Geoffry ParadiseAronson, Richard, MD   Recommendations at discharge:   Follow up with cardiology, need 30 day monitor. Also further follow up on Bradycardia.  Follow up with neurology for stroke.   Discharge Diagnoses: Principal Problem:   TIA (transient ischemic attack) Active Problems:   Gait disturbance   Essential hypertension   Hyperlipidemia   OSA (obstructive sleep apnea)   Hypothyroidism   Sensorineural hearing loss (SNHL), bilateral   Bradycardia   Acute CVA (cerebrovascular accident) (HCC)   Aortic valve stenosis  Resolved Problems:   * No resolved hospital problems. *  Hospital Course: 88 year old with past medical history significant previous TIA, hypothyroidism, benign positional vertigo, hearing loss, depression, essential hypertension, hyperlipidemia who presented to the ER with confusion and word finding difficulty.  Last known well sometimes around 10 AM on 04/16/2022.  She could not remember how to use the phone.  She went to speak with the nurse at her assisted living and had trouble getting her words out.  Her symptoms completely resolved on arrival to the ED.  No code stroke was activated.  She was noted to be bradycardic and was admitted for further evaluation for TIA.   Assessment and Plan:  1-Acute Ischemic stroke involving the Left Cerebral Hemisphere mostly in the Left MCA territory:  -Presents with Confusion, word finding difficulty -MRI with patchy scattered nonhemorrhagic infarct in the left hemisphere in the left MCA territory. -Continue home aspirin, started on Plavix. -CT angio head and neck; no large vessel occlusion.  -2D echo; EF 75 %, moderate aortic valve stenosis.  -A1c; 6.4  LDL 115, started on Lipitor.  Continue to monitor.  Repeated CT head negative.  Plan for  three weeks aspirin plavix then---plavix alone.  Carb modified diet.   2-Bradycardia;  HR 45- TSH. Normal.   Mg normal.  Cardiology consulted. Recommend cardiac monitor at dc/  Stable for discharge.   HTN; permissive HTN Resume  nifedipine and diuretics at discharge   Hypothyroidism; Continue with synthroid.    sensorineural hearing loss: Patient uses hearing aid.            Consultants: Neurology , cardiology  Procedures performed: ECHO Disposition: Home Diet recommendation:  Discharge Diet Orders (From admission, onward)     Start     Ordered   04/18/22 0000  Diet - low sodium heart healthy        04/18/22 1238           Cardiac diet DISCHARGE MEDICATION: Allergies as of 04/18/2022       Reactions   Aleve [naproxen] Other (See Comments)   Unknown reaction Per facility   Bee Venom Other (See Comments)   Unknown reaction Per facility    Cipro [ciprofloxacin Hcl] Other (See Comments)   Unknown reaction  Per facility   Crestor [rosuvastatin] Other (See Comments)   Myalgias    Iodine Other (See Comments)   Unknown reaction  Per facility   Lodine [etodolac] Other (See Comments)   Unknown reaction Per facility   Sulfonamide Derivatives Other (See Comments)   Unknown reaction Per facility   Epipen [epinephrine] Palpitations   Macrobid [nitrofurantoin Macrocrystal] Rash   Metrocream [metronidazole] Rash   Xanax [alprazolam] Rash        Medication List     STOP taking these medications  ibuprofen 200 MG tablet Commonly known as: ADVIL       TAKE these medications    acetaminophen 500 MG tablet Commonly known as: TYLENOL Take 1 tablet (500 mg total) by mouth every 8 (eight) hours as needed for moderate pain.   aspirin 81 MG tablet Take 1 tablet (81 mg total) by mouth in the morning.   atorvastatin 40 MG tablet Commonly known as: LIPITOR Take 1 tablet (40 mg total) by mouth daily. Start taking on: April 19, 2022 What changed:   medication strength how much to take when to take this additional instructions   Biotin 5000 MCG Caps Take 10,000 mcg by mouth in the morning.   cholecalciferol 1000 units tablet Commonly known as: VITAMIN D Take 1,000 Units by mouth in the morning.   clopidogrel 75 MG tablet Commonly known as: PLAVIX Take 1 tablet (75 mg total) by mouth daily. Start taking on: April 19, 2022   conjugated estrogens 0.625 MG/GM vaginal cream Commonly known as: PREMARIN Place 1 Applicatorful vaginally. Twice a week.   escitalopram 10 MG tablet Commonly known as: LEXAPRO Take 10 mg by mouth at bedtime.   felodipine 5 MG 24 hr tablet Commonly known as: PLENDIL Take 5 mg by mouth daily.   gabapentin 100 MG capsule Commonly known as: NEURONTIN Take 100-300 mg by mouth at bedtime.   indapamide 2.5 MG tablet Commonly known as: LOZOL Take 2.5 mg by mouth daily.   levothyroxine 50 MCG tablet Commonly known as: SYNTHROID Take 50 mcg by mouth in the morning.   Magnesium 200 MG Tabs Take 1 tablet by mouth daily.   methenamine 1 g tablet Commonly known as: HIPREX Take 1 g by mouth 2 (two) times daily with a meal.        Discharge Exam: Filed Weights   04/16/22 1700  Weight: 81.6 kg   General NAD  Condition at discharge: stable  The results of significant diagnostics from this hospitalization (including imaging, microbiology, ancillary and laboratory) are listed below for reference.   Imaging Studies: CT ANGIO HEAD NECK W WO CM  Result Date: 04/17/2022 CLINICAL DATA:  Follow-up examination for stroke. EXAM: CT ANGIOGRAPHY HEAD AND NECK TECHNIQUE: Multidetector CT imaging of the head and neck was performed using the standard protocol during bolus administration of intravenous contrast. Multiplanar CT image reconstructions and MIPs were obtained to evaluate the vascular anatomy. Carotid stenosis measurements (when applicable) are obtained utilizing NASCET criteria, using the  distal internal carotid diameter as the denominator. RADIATION DOSE REDUCTION: This exam was performed according to the departmental dose-optimization program which includes automated exposure control, adjustment of the mA and/or kV according to patient size and/or use of iterative reconstruction technique. CONTRAST:  25mL OMNIPAQUE IOHEXOL 350 MG/ML SOLN COMPARISON:  CT from earlier same day as well as multiple previous exams. FINDINGS: CT HEAD FINDINGS Brain: Previously identified left MCA territory infarcts not well visualized by CT. No acute intracranial hemorrhage. No new large vessel territory infarct. Atrophy with chronic small vessel ischemic disease. No mass lesion or midline shift. No hydrocephalus or extra-axial fluid collection. Vascular: No hyperdense vessel. Scattered vascular calcifications noted within the carotid siphons. Skull: Scalp soft tissues and calvarium demonstrate no new finding. Sinuses/Orbits: Globes orbital soft tissues demonstrate no acute finding. Paranasal sinuses remain largely clear. No significant mastoid effusion. Other: None. Review of the MIP images confirms the above findings CTA NECK FINDINGS Aortic arch: Visualized aortic arch normal caliber with standard branch pattern. Mild atheromatous change about the  arch and origin the great vessels without significant stenosis. Right carotid system: Right common and internal carotid arteries patent without dissection or occlusion. Atheromatous change about the right carotid bulb without hemodynamically significant greater than 50% stenosis. Left carotid system: Left common and internal carotid arteries patent without dissection or occlusion. Atheromatous change about the left carotid bulb without hemodynamically significant stenosis. Vertebral arteries: Both vertebral arteries arise from the subclavian arteries. No proximal subclavian artery stenosis. Both vertebral arteries widely patent without stenosis, dissection or occlusion.  Skeleton: No discrete or worrisome osseous lesions. Mild spondylosis for age. Other neck: No other acute soft tissue abnormality within the neck. Upper chest: Visualized upper chest demonstrates no acute finding. Review of the MIP images confirms the above findings CTA HEAD FINDINGS Anterior circulation: Mild atheromatous change within the carotid siphons without stenosis. A1 segments patent. Normal anterior communicating complex. Anterior cerebral arteries widely patent. No M1 stenosis or occlusion. Distal right MCA branches well perfused. Acute left M2 occlusion, inferior division (series 14, image 21). Posterior circulation: Both V4 segments widely patent. Right PICA patent. Left PICA not well seen. Basilar patent without stenosis. Superior cerebellar and posterior cerebral arteries patent bilaterally. Venous sinuses: Grossly patent allowing for timing the contrast bolus. Anatomic variants: None significant.  No aneurysm. Review of the MIP images confirms the above findings IMPRESSION: CT HEAD IMPRESSION: 1. Previously identified left MCA territory infarcts not well visualized by CT. No hemorrhagic transformation or other complication. 2. No other new acute intracranial abnormality. 3. Underlying atrophy with chronic small vessel ischemic disease. CTA HEAD AND NECK IMPRESSION: 1. Acute left M2 occlusion, inferior division. 2. Otherwise wide patency of the major arterial vasculature of the head and neck. 3. Mild atheromatous change about the carotid bifurcations and carotid siphons without hemodynamically significant stenosis. Electronically Signed   By: Rise Mu M.D.   On: 04/17/2022 20:20   ECHOCARDIOGRAM COMPLETE  Result Date: 04/17/2022    ECHOCARDIOGRAM REPORT   Patient Name:   ATLEY SCARBORO [facility] Date of Exam: 04/17/2022 Medical Rec #:  [id number]          Height:       65.0 in Accession #:    [id number]         Weight:       180.0 lb Date of Birth:  [birth year]          BSA:          1.892 m  Patient Age:    89 years           BP:           121/41 mmHg Patient Gender: F                  HR:           50 bpm. Exam Location:  Inpatient Procedure: 2D Echo, Color Doppler and Cardiac Doppler Indications:    TIA  History:        Patient has prior history of Echocardiogram examinations, most                 recent 08/17/2018. Signs/Symptoms:Murmur; Risk                 Factors:Hypertension, Dyslipidemia and Sleep Apnea.  Sonographer:    Delcie Roch RDCS Referring Phys: [id number] MOHAMMAD L GARBA IMPRESSIONS  1. Left ventricular ejection fraction, by estimation, is 70 to 75%. The left ventricle has hyperdynamic function. The left ventricle has no regional wall motion abnormalities.  There is mild left ventricular hypertrophy. Left ventricular diastolic parameters are consistent with Grade I diastolic dysfunction (impaired relaxation).  2. Right ventricular systolic function is normal. The right ventricular size is normal. Tricuspid regurgitation signal is inadequate for assessing PA pressure.  3. The mitral valve is degenerative. Trivial mitral valve regurgitation. No evidence of mitral stenosis.  4. The aortic valve is abnormal. There is severe calcifcation of the aortic valve. Aortic valve regurgitation is trivial. Moderate aortic valve stenosis. Aortic valve mean gradient measures 30.0 mmHg. Aortic valve Vmax measures 3.61 m/s. Hyperdynamic function and elevated LVOT TVI impact calculation of valve area and dimensionless index.  5. The inferior vena cava is normal in size with greater than 50% respiratory variability, suggesting right atrial pressure of 3 mmHg. FINDINGS  Left Ventricle: Left ventricular ejection fraction, by estimation, is 70 to 75%. The left ventricle has hyperdynamic function. The left ventricle has no regional wall motion abnormalities. The left ventricular internal cavity size was normal in size. There is mild left ventricular hypertrophy. Left ventricular diastolic parameters are  consistent with Grade I diastolic dysfunction (impaired relaxation). Right Ventricle: The right ventricular size is normal. No increase in right ventricular wall thickness. Right ventricular systolic function is normal. Tricuspid regurgitation signal is inadequate for assessing PA pressure. Left Atrium: Left atrial size was normal in size. Right Atrium: Right atrial size was normal in size. Pericardium: Trivial pericardial effusion is present. Presence of epicardial fat layer. Mitral Valve: The mitral valve is degenerative in appearance. Mild mitral annular calcification. Trivial mitral valve regurgitation. No evidence of mitral valve stenosis. The mean mitral valve gradient is 2.2 mmHg with average heart rate of 49 bpm. Tricuspid Valve: The tricuspid valve is normal in structure. Tricuspid valve regurgitation is trivial. No evidence of tricuspid stenosis. Aortic Valve: The aortic valve is abnormal. There is severe calcifcation of the aortic valve. Aortic valve regurgitation is trivial. Moderate aortic stenosis is present. Aortic valve mean gradient measures 30.0 mmHg. Aortic valve peak gradient measures 52.1 mmHg. Aortic valve area, by VTI measures 1.06 cm. Pulmonic Valve: The pulmonic valve was normal in structure. Pulmonic valve regurgitation is trivial. No evidence of pulmonic stenosis. Aorta: The aortic root is normal in size and structure. Venous: The inferior vena cava is normal in size with greater than 50% respiratory variability, suggesting right atrial pressure of 3 mmHg. IAS/Shunts: No atrial level shunt detected by color flow Doppler.  LEFT VENTRICLE PLAX 2D LVIDd:         4.30 cm   Diastology LVIDs:         2.10 cm   LV e' medial:    6.53 cm/s LV PW:         1.20 cm   LV E/e' medial:  17.5 LV IVS:        1.20 cm   LV e' lateral:   6.74 cm/s LVOT diam:     1.80 cm   LV E/e' lateral: 16.9 LV SV:         99 LV SV Index:   52 LVOT Area:     2.54 cm  RIGHT VENTRICLE             IVC RV S prime:     13.50  cm/s  IVC diam: 1.50 cm TAPSE (M-mode): 2.8 cm LEFT ATRIUM             Index        RIGHT ATRIUM  Index LA diam:        4.30 cm 2.27 cm/m   RA Area:     14.30 cm LA Vol (A2C):   49.5 ml 26.17 ml/m  RA Volume:   31.40 ml  16.60 ml/m LA Vol (A4C):   47.1 ml 24.90 ml/m LA Biplane Vol: 48.6 ml 25.69 ml/m  AORTIC VALVE AV Area (Vmax):    1.02 cm AV Area (Vmean):   0.96 cm AV Area (VTI):     1.06 cm AV Vmax:           361.00 cm/s AV Vmean:          254.000 cm/s AV VTI:            0.929 m AV Peak Grad:      52.1 mmHg AV Mean Grad:      30.0 mmHg LVOT Vmax:         144.00 cm/s LVOT Vmean:        95.700 cm/s LVOT VTI:          0.388 m LVOT/AV VTI ratio: 0.42  AORTA Ao Root diam: 3.00 cm Ao Asc diam:  3.10 cm MITRAL VALVE MV Area (PHT): 2.77 cm     SHUNTS MV Mean grad:  2.2 mmHg     Systemic VTI:  0.39 m MV Decel Time: 274 msec     Systemic Diam: 1.80 cm MV E velocity: 114.00 cm/s MV A velocity: 132.00 cm/s MV E/A ratio:  0.86 Weston Brass MD Electronically signed by Weston Brass MD Signature Date/Time: 04/17/2022/3:35:05 PM    Final    CT HEAD WO CONTRAST ( )  Result Date: 04/17/2022 CLINICAL DATA:  88 year old female with altered mental status. EXAM: CT HEAD WITHOUT CONTRAST TECHNIQUE: Contiguous axial images were obtained from the base of the skull through the vertex without intravenous contrast. RADIATION DOSE REDUCTION: This exam was performed according to the departmental dose-optimization program which includes automated exposure control, adjustment of the mA and/or kV according to patient size and/or use of iterative reconstruction technique. COMPARISON:  04/16/2022 CT, MRI and prior study FINDINGS: Brain: Known small scattered patchy LEFT MCA infarcts identified on 04/16/2022 MR are not well visualized on this CT. No new infarct or hemorrhage identified. There is no evidence of mass lesion, mass effect, midline shift, hydrocephalus or extra-axial collection. Atrophy and chronic  small-vessel white matter ischemic changes again noted. Vascular: No new abnormalities identified. Carotid atherosclerotic calcifications are present. Skull: Normal. Negative for fracture or focal lesion. Sinuses/Orbits: No acute finding. Other: None. IMPRESSION: 1. No evidence of new intracranial abnormality. Known small scattered patchy LEFT MCA infarcts identified on 04/16/2022 MR are not well visualized on this CT. 2. Atrophy and chronic small-vessel white matter ischemic changes. Electronically Signed   By: Harmon Pier M.D.   On: 04/17/2022 13:56   [name] CONTRAST  Result Date: 04/17/2022 CLINICAL DATA:  Initial evaluation for acute TIA. EXAM: MRI HEAD WITHOUT CONTRAST TECHNIQUE: Multiplanar, multiecho pulse sequences of the brain and surrounding structures were obtained without intravenous contrast. COMPARISON:  CT from earlier the same day. FINDINGS: Brain: Cerebral volume within normal limits. Scattered patchy T2/FLAIR hyperintensity involving the periventricular and deep white matter, consistent with chronic small vessel ischemic disease, mild for age. Scattered patchy foci of restricted diffusion are seen involving the left insula and adjacent mid and posterior left temporal region, consistent with acute left MCA distribution infarcts. Involvement of the left periatrial white matter. No associated hemorrhage or significant mass effect.  Otherwise, gray-white matter differentiation maintained. No acute intracranial hemorrhage. No visible significant chronic intracranial blood products. No mass lesion, midline shift or mass effect. No hydrocephalus or extra-axial fluid collection. Pituitary gland and suprasellar region within normal limits. Vascular: Major intracranial vascular flow voids are maintained. Skull and upper cervical spine: Craniocervical junction normal. Bone marrow signal intensity within normal limits. No scalp soft tissue abnormality. Sinuses/Orbits: Globes orbital soft tissues  demonstrate no acute finding. Prior bilateral ocular lens replacement. Scattered mucosal thickening noted about the ethmoidal air cells. Small right mastoid effusion, of doubtful significance. Other: None. IMPRESSION: 1. Scattered patchy acute ischemic nonhemorrhagic left MCA distribution infarcts involving the left insula and adjacent left temporal region. No associated hemorrhage or significant mass effect. 2. Underlying mild chronic microvascular ischemic disease. Electronically Signed   By: Jeannine Boga M.D.   On: 04/17/2022 00:18   CT HEAD WO CONTRAST (5MM)  Result Date: 04/16/2022 CLINICAL DATA:  Neuro deficit, acute, stroke suspected EXAM: CT HEAD WITHOUT CONTRAST TECHNIQUE: Contiguous axial images were obtained from the base of the skull through the vertex without intravenous contrast. RADIATION DOSE REDUCTION: This exam was performed according to the departmental dose-optimization program which includes automated exposure control, adjustment of the mA and/or kV according to patient size and/or use of iterative reconstruction technique. COMPARISON:  06/18/2019 FINDINGS: Brain: No intracranial hemorrhage, mass effect, or midline shift. Generalized atrophy is normal for age. No hydrocephalus. The basilar cisterns are patent. Mild periventricular and deep white matter hypodensity consistent with chronic small vessel ischemia, unchanged. No evidence of territorial infarct or acute ischemia. No extra-axial or intracranial fluid collection. Vascular: No hyperdense vessel or unexpected calcification. Skull: No fracture or focal lesion. Sinuses/Orbits: Mild mucosal thickening of ethmoid air cells. Remote right orbital fracture. Bilateral cataract resection. Other: None. IMPRESSION: 1. No acute intracranial abnormality. 2. Unchanged age related atrophy and mild chronic small vessel ischemia. Electronically Signed   By: Keith Rake M.D.   On: 04/16/2022 19:40    Microbiology: Results for orders  placed or performed during the hospital encounter of 04/29/12  Urine culture     Status: None   Collection Time: 04/29/12  9:57 PM   Specimen: Urine, Clean Catch  Result Value Ref Range Status   Specimen Description URINE, CLEAN CATCH  Final   Special Requests NONE  Final   Culture  Setup Time 04/30/2012 13:26  Final   Colony Count 75,000 COLONIES/ML  Final   Culture   Final    STAPHYLOCOCCUS SPECIES (COAGULASE NEGATIVE) Note: RIFAMPIN AND GENTAMICIN SHOULD NOT BE USED AS SINGLE DRUGS FOR TREATMENT OF STAPH INFECTIONS.   Report Status 05/02/2012 FINAL  Final   Organism ID, Bacteria STAPHYLOCOCCUS SPECIES (COAGULASE NEGATIVE)  Final      Susceptibility   Staphylococcus species (coagulase negative) - MIC*    GENTAMICIN <=0.5 Sensitive     LEVOFLOXACIN <=0.12 Sensitive     NITROFURANTOIN <=16 Sensitive     OXACILLIN <=0.25 Sensitive     PENICILLIN >=0.5 Resistant     RIFAMPIN <=0.5 Sensitive     VANCOMYCIN 1 Sensitive     TETRACYCLINE 2 Sensitive     * STAPHYLOCOCCUS SPECIES (COAGULASE NEGATIVE)    Labs: CBC: Recent Labs  Lab 04/16/22 1807 04/17/22 0849  WBC 8.5 5.7  NEUTROABS 5.1  --   HGB 15.0 14.9  HCT 46.4* 44.7  MCV 102.4* 99.6  PLT 222 086   Basic Metabolic Panel: Recent Labs  Lab 04/16/22 1807 04/17/22 0849  NA 139 138  K 4.2 4.6  CL 100 101  CO2 30 25  GLUCOSE 98 175*  BUN 17 15  CREATININE 0.80 0.86  CALCIUM 9.5 8.9  MG  --  1.8   Liver Function Tests: Recent Labs  Lab 04/16/22 1807  AST 22  ALT 18  ALKPHOS 46  BILITOT 0.7  PROT 6.9  ALBUMIN 3.8   CBG: Recent Labs  Lab 04/17/22 0825  GLUCAP 185*    Discharge time spent: greater than 30 minutes.  Signed: Alba Cory, MD [facility] 04/18/2022

## 2022-04-19 ENCOUNTER — Encounter: Payer: Self-pay | Admitting: *Deleted

## 2022-04-19 NOTE — Progress Notes (Signed)
Patient ID: Autumn Allen, female   DOB: [birth year], 88 y.o.   MRN: [id number] Patient enrolled for [employer] to ship a 30 day cardiac event monitor to her address on file.  Letter with instructions mailed to patient.

## 2022-04-21 DIAGNOSIS — I10 Essential (primary) hypertension: Secondary | ICD-10-CM | POA: Diagnosis not present

## 2022-04-21 DIAGNOSIS — R7301 Impaired fasting glucose: Secondary | ICD-10-CM | POA: Diagnosis not present

## 2022-04-21 DIAGNOSIS — G459 Transient cerebral ischemic attack, unspecified: Secondary | ICD-10-CM | POA: Diagnosis not present

## 2022-04-21 DIAGNOSIS — I350 Nonrheumatic aortic (valve) stenosis: Secondary | ICD-10-CM | POA: Diagnosis not present

## 2022-04-25 ENCOUNTER — Ambulatory Visit: Payer: Medicare PPO | Attending: Cardiology

## 2022-04-25 DIAGNOSIS — R001 Bradycardia, unspecified: Secondary | ICD-10-CM

## 2022-04-26 DIAGNOSIS — M6281 Muscle weakness (generalized): Secondary | ICD-10-CM | POA: Diagnosis not present

## 2022-04-26 DIAGNOSIS — R278 Other lack of coordination: Secondary | ICD-10-CM | POA: Diagnosis not present

## 2022-04-27 DIAGNOSIS — M6281 Muscle weakness (generalized): Secondary | ICD-10-CM | POA: Diagnosis not present

## 2022-04-27 DIAGNOSIS — R278 Other lack of coordination: Secondary | ICD-10-CM | POA: Diagnosis not present

## 2022-04-27 DIAGNOSIS — R41841 Cognitive communication deficit: Secondary | ICD-10-CM | POA: Diagnosis not present

## 2022-04-27 DIAGNOSIS — Z9181 History of falling: Secondary | ICD-10-CM | POA: Diagnosis not present

## 2022-04-27 DIAGNOSIS — R2681 Unsteadiness on feet: Secondary | ICD-10-CM | POA: Diagnosis not present

## 2022-04-28 DIAGNOSIS — R278 Other lack of coordination: Secondary | ICD-10-CM | POA: Diagnosis not present

## 2022-04-28 DIAGNOSIS — M6281 Muscle weakness (generalized): Secondary | ICD-10-CM | POA: Diagnosis not present

## 2022-04-29 DIAGNOSIS — R41841 Cognitive communication deficit: Secondary | ICD-10-CM | POA: Diagnosis not present

## 2022-04-29 DIAGNOSIS — Z9181 History of falling: Secondary | ICD-10-CM | POA: Diagnosis not present

## 2022-04-29 DIAGNOSIS — R278 Other lack of coordination: Secondary | ICD-10-CM | POA: Diagnosis not present

## 2022-04-29 DIAGNOSIS — R2681 Unsteadiness on feet: Secondary | ICD-10-CM | POA: Diagnosis not present

## 2022-04-29 DIAGNOSIS — M6281 Muscle weakness (generalized): Secondary | ICD-10-CM | POA: Diagnosis not present

## 2022-04-30 DIAGNOSIS — M6281 Muscle weakness (generalized): Secondary | ICD-10-CM | POA: Diagnosis not present

## 2022-04-30 DIAGNOSIS — R278 Other lack of coordination: Secondary | ICD-10-CM | POA: Diagnosis not present

## 2022-05-02 DIAGNOSIS — Z9181 History of falling: Secondary | ICD-10-CM | POA: Diagnosis not present

## 2022-05-02 DIAGNOSIS — M6281 Muscle weakness (generalized): Secondary | ICD-10-CM | POA: Diagnosis not present

## 2022-05-02 DIAGNOSIS — R2681 Unsteadiness on feet: Secondary | ICD-10-CM | POA: Diagnosis not present

## 2022-05-02 DIAGNOSIS — R41841 Cognitive communication deficit: Secondary | ICD-10-CM | POA: Diagnosis not present

## 2022-05-02 DIAGNOSIS — R278 Other lack of coordination: Secondary | ICD-10-CM | POA: Diagnosis not present

## 2022-05-03 DIAGNOSIS — M6281 Muscle weakness (generalized): Secondary | ICD-10-CM | POA: Diagnosis not present

## 2022-05-03 DIAGNOSIS — R278 Other lack of coordination: Secondary | ICD-10-CM | POA: Diagnosis not present

## 2022-05-03 DIAGNOSIS — I69928 Other speech and language deficits following unspecified cerebrovascular disease: Secondary | ICD-10-CM | POA: Diagnosis not present

## 2022-05-03 DIAGNOSIS — R41841 Cognitive communication deficit: Secondary | ICD-10-CM | POA: Diagnosis not present

## 2022-05-04 DIAGNOSIS — R2681 Unsteadiness on feet: Secondary | ICD-10-CM | POA: Diagnosis not present

## 2022-05-04 DIAGNOSIS — R41841 Cognitive communication deficit: Secondary | ICD-10-CM | POA: Diagnosis not present

## 2022-05-04 DIAGNOSIS — Z9181 History of falling: Secondary | ICD-10-CM | POA: Diagnosis not present

## 2022-05-04 DIAGNOSIS — R278 Other lack of coordination: Secondary | ICD-10-CM | POA: Diagnosis not present

## 2022-05-04 DIAGNOSIS — M6281 Muscle weakness (generalized): Secondary | ICD-10-CM | POA: Diagnosis not present

## 2022-05-05 DIAGNOSIS — M6281 Muscle weakness (generalized): Secondary | ICD-10-CM | POA: Diagnosis not present

## 2022-05-05 DIAGNOSIS — R278 Other lack of coordination: Secondary | ICD-10-CM | POA: Diagnosis not present

## 2022-05-07 DIAGNOSIS — I69928 Other speech and language deficits following unspecified cerebrovascular disease: Secondary | ICD-10-CM | POA: Diagnosis not present

## 2022-05-07 DIAGNOSIS — R278 Other lack of coordination: Secondary | ICD-10-CM | POA: Diagnosis not present

## 2022-05-07 DIAGNOSIS — R41841 Cognitive communication deficit: Secondary | ICD-10-CM | POA: Diagnosis not present

## 2022-05-10 DIAGNOSIS — I69928 Other speech and language deficits following unspecified cerebrovascular disease: Secondary | ICD-10-CM | POA: Diagnosis not present

## 2022-05-10 DIAGNOSIS — R41841 Cognitive communication deficit: Secondary | ICD-10-CM | POA: Diagnosis not present

## 2022-05-10 DIAGNOSIS — Z9181 History of falling: Secondary | ICD-10-CM | POA: Diagnosis not present

## 2022-05-10 DIAGNOSIS — R278 Other lack of coordination: Secondary | ICD-10-CM | POA: Diagnosis not present

## 2022-05-10 DIAGNOSIS — R2681 Unsteadiness on feet: Secondary | ICD-10-CM | POA: Diagnosis not present

## 2022-05-10 DIAGNOSIS — M6281 Muscle weakness (generalized): Secondary | ICD-10-CM | POA: Diagnosis not present

## 2022-05-11 DIAGNOSIS — R278 Other lack of coordination: Secondary | ICD-10-CM | POA: Diagnosis not present

## 2022-05-11 DIAGNOSIS — R2681 Unsteadiness on feet: Secondary | ICD-10-CM | POA: Diagnosis not present

## 2022-05-11 DIAGNOSIS — M6281 Muscle weakness (generalized): Secondary | ICD-10-CM | POA: Diagnosis not present

## 2022-05-11 DIAGNOSIS — Z9181 History of falling: Secondary | ICD-10-CM | POA: Diagnosis not present

## 2022-05-11 DIAGNOSIS — R41841 Cognitive communication deficit: Secondary | ICD-10-CM | POA: Diagnosis not present

## 2022-05-12 DIAGNOSIS — R278 Other lack of coordination: Secondary | ICD-10-CM | POA: Diagnosis not present

## 2022-05-12 DIAGNOSIS — I69928 Other speech and language deficits following unspecified cerebrovascular disease: Secondary | ICD-10-CM | POA: Diagnosis not present

## 2022-05-12 DIAGNOSIS — R41841 Cognitive communication deficit: Secondary | ICD-10-CM | POA: Diagnosis not present

## 2022-05-13 DIAGNOSIS — M6281 Muscle weakness (generalized): Secondary | ICD-10-CM | POA: Diagnosis not present

## 2022-05-13 DIAGNOSIS — R2681 Unsteadiness on feet: Secondary | ICD-10-CM | POA: Diagnosis not present

## 2022-05-13 DIAGNOSIS — R278 Other lack of coordination: Secondary | ICD-10-CM | POA: Diagnosis not present

## 2022-05-13 DIAGNOSIS — Z9181 History of falling: Secondary | ICD-10-CM | POA: Diagnosis not present

## 2022-05-13 DIAGNOSIS — R41841 Cognitive communication deficit: Secondary | ICD-10-CM | POA: Diagnosis not present

## 2022-05-14 DIAGNOSIS — I69928 Other speech and language deficits following unspecified cerebrovascular disease: Secondary | ICD-10-CM | POA: Diagnosis not present

## 2022-05-14 DIAGNOSIS — M6281 Muscle weakness (generalized): Secondary | ICD-10-CM | POA: Diagnosis not present

## 2022-05-14 DIAGNOSIS — R278 Other lack of coordination: Secondary | ICD-10-CM | POA: Diagnosis not present

## 2022-05-14 DIAGNOSIS — R41841 Cognitive communication deficit: Secondary | ICD-10-CM | POA: Diagnosis not present

## 2022-05-17 DIAGNOSIS — I69928 Other speech and language deficits following unspecified cerebrovascular disease: Secondary | ICD-10-CM | POA: Diagnosis not present

## 2022-05-17 DIAGNOSIS — R278 Other lack of coordination: Secondary | ICD-10-CM | POA: Diagnosis not present

## 2022-05-17 DIAGNOSIS — R41841 Cognitive communication deficit: Secondary | ICD-10-CM | POA: Diagnosis not present

## 2022-05-17 DIAGNOSIS — M6281 Muscle weakness (generalized): Secondary | ICD-10-CM | POA: Diagnosis not present

## 2022-05-18 ENCOUNTER — Inpatient Hospital Stay: Payer: Medicare PPO | Admitting: Adult Health

## 2022-05-18 DIAGNOSIS — R2681 Unsteadiness on feet: Secondary | ICD-10-CM | POA: Diagnosis not present

## 2022-05-18 DIAGNOSIS — R278 Other lack of coordination: Secondary | ICD-10-CM | POA: Diagnosis not present

## 2022-05-18 DIAGNOSIS — R41841 Cognitive communication deficit: Secondary | ICD-10-CM | POA: Diagnosis not present

## 2022-05-18 DIAGNOSIS — Z9181 History of falling: Secondary | ICD-10-CM | POA: Diagnosis not present

## 2022-05-18 DIAGNOSIS — M6281 Muscle weakness (generalized): Secondary | ICD-10-CM | POA: Diagnosis not present

## 2022-05-18 NOTE — Progress Notes (Unsigned)
[facility] [address] [place]. [place] [address] [phone]       HOSPITAL FOLLOW UP NOTE  [name] Date of Birth:  05/30/1930 Medical Record Number:  [id number]   Reason for Referral:  hospital stroke follow up    SUBJECTIVE:   CHIEF COMPLAINT:  No chief complaint on file.   HPI:   [name] is a 88 y.o. female with history of mild aortic stenosis, hypertension, hyperlipidemia, obstructive sleep apnea on CPAP, prior history of TIA 2016 without residual deficits who presented to the emergency room on 04/16/2022 for episode of confusion and word finding difficulty.  Evaluated by Dr. Leonie Man for acute left MCA ischemic infarcts of cryptogenic etiology.  CTA head/neck ***. EF 70 to 75%.  LDL 115.  A1c 6.1.  Recommended DAPT for 3 weeks then Plavix alone and increased home dose atorvastatin from 20 mg to 40 mg daily.  Recommended outpatient cardiac monitor to evaluate for A-fib.  Discharged home in stable condition without therapy needs.        PERTINENT IMAGING  CT head 1. No acute intracranial abnormality. 2. Unchanged age related atrophy and mild chronic small vessel ischemia. CTA head & neck pending MRI  1. Scattered patchy acute ischemic nonhemorrhagic left MCA distribution infarcts involving the left insula and adjacent left temporal region. No associated hemorrhage or significant mass effect.2. Underlying mild chronic microvascular ischemic disease 2D Echo EF 70-75%. left ventricle has hyperdynamic function. mild left ventricular hypertrophy. Left ventricular diastolic parameters are consistent with Grade I diastolic dysfunction    LDL 115 HgbA1c 6.1    ROS:   14 system review of systems performed and negative with exception of ***  PMH:  Past Medical History:  Diagnosis Date   Allergy    Aortic stenosis    a. mild to mod by Echo 07/2012   Arthritis    Asymptomatic varicose veins    Benign paroxysmal vertigo     Bradycardia, unspecified    Bronchitis, not specified as acute or chronic    Cardiac murmur    Cataracts, bilateral    Chest pain, unspecified    Chronic cystitis    Depression    Diarrhea    Dyspnea    Dysuria    Frequency of micturition    Frequent urination    Gait disturbance 06/10/2014   Gastric ulcer    Hearing loss    Hemorrhoids    History of bone density study    History of colonoscopy    History of frequent urinary tract infections    History of urinary tract infection    Hx of cardiovascular stress test    a. ETT-Echo 3/12:  EF 60%, normal study   Hx of echocardiogram    a. Echo 2/14:  Mild LVH, EF 60-65%, Gr 1 diast dysfn, mild to mod AS, mean 17 mmHg, AVA 1.3 (VTI), trivial MR, mild LAE, PASP 31    Hyperlipemia    Hypertension    Hypothyroid    Left knee pain    Leg pain    Low back pain    Obstructive sleep apnea    OSA on CPAP    Other disorders of intestinal carbohydrate absorption    Other type of osteoarthritis, unspecified site    Overweight    Pain in thoracic spine    Palpitations    a. event monitor 3/14:  NSR, sinus brady   Plantar fasciitis    Plantar fasciitis  Right shoulder pain    S/P cholecystectomy    S/P TAH-BSO    Sleep apnea    Stroke [facility])    tia 2016   TIA (transient ischemic attack)    Varicose veins of unspecified lower extremity with inflammation     PSH:  Past Surgical History:  Procedure Laterality Date   ABDOMINAL HYSTERECTOMY     CHOLECYSTECTOMY     EP IMPLANTABLE DEVICE N/A 06/29/2016   Procedure: Loop Recorder Insertion;  Surgeon: Will Jorja Loa, MD;  Location: [facility] INVASIVE CV LAB;  Service: Cardiovascular;  Laterality: N/A;   FOOT SURGERY  1955   INCONTINENCE SURGERY     LOOP RECORDER REMOVAL N/A 08/10/2017   Procedure: LOOP RECORDER REMOVAL;  Surgeon: Regan Lemming, MD;  Location: [facility] INVASIVE CV LAB;  Service: Cardiovascular;  Laterality: N/A;   TONSILLECTOMY      Social History:  Social History    Socioeconomic History   Marital status: Widowed    Spouse name: Not on file   Number of children: 2   Years of education: college gr   Highest education level: Not on file  Occupational History   Occupation: retired Runner, broadcasting/film/video  Tobacco Use   Smoking status: Former    Years: 30.00    Types: Cigarettes   Smokeless tobacco: Never  Vaping Use   Vaping Use: Never used  Substance and Sexual Activity   Alcohol use: Yes    Alcohol/week: 1.0 standard drink of alcohol    Types: 1 Glasses of wine per week    Comment: wine sometimes   Drug use: No   Sexual activity: Not on file  Other Topics Concern   Not on file  Social History Narrative   Diet:      Caffeine:  2-3 cups of caffeine daily- decaf coffee.      Married, if yes what year: widow. Married 1955       Do you live in a house, apartment, assisted living, condo, trailer, ect: [facility], independent living.   Is it one or more stories: one      How many persons live in your home? 1      Pets:cno      Highest level or education completed: college degree      Current/Past profession: retired Engineer, site      Exercise:  yes                Type and how often: daily         Living Will: Yes   DNR: Yes   POA/HPOA: Yes      Functional Status:   Do you have difficulty bathing or dressing yourself? No   Do you have difficulty preparing food or eating? No   Do you have difficulty managing your medications? No   Do you have difficulty managing your finances? No   Do you have difficulty affording your medications? No      Patient is right handed.    Social Determinants of Health   Financial Resource Strain: Not on file  Food Insecurity: No Food Insecurity (04/17/2022)   Hunger Vital Sign    Worried About Running Out of Food in the Last Year: Never true    Ran Out of Food in the Last Year: Never true  Transportation Needs: Not on file  Physical Activity: Not on file  Stress: Not on file  Social Connections:  Not on file  Intimate Partner Violence: Not At Risk (04/17/2022)  Humiliation, Afraid, Rape, and Kick questionnaire    Fear of Current or Ex-Partner: No    Emotionally Abused: No    Physically Abused: No    Sexually Abused: No    Family History:  Family History  Problem Relation Age of Onset   Heart attack Mother 16   Heart failure Mother    Arthritis Mother    Lung disease Father    Cancer - Prostate Father    Healthy Brother    Healthy Brother    Heart failure Maternal Uncle    Cancer Son     Medications:   Current Outpatient Medications on File Prior to Visit  Medication Sig Dispense Refill   acetaminophen (TYLENOL) 500 MG tablet Take 1 tablet (500 mg total) by mouth every 8 (eight) hours as needed for moderate pain. 30 tablet 0   aspirin 81 MG tablet Take 1 tablet (81 mg total) by mouth in the morning. 21 tablet 0   atorvastatin (LIPITOR) 40 MG tablet Take 1 tablet (40 mg total) by mouth daily. 30 tablet 1   Biotin 5000 MCG CAPS Take 10,000 mcg by mouth in the morning.     cholecalciferol (VITAMIN D) 1000 units tablet Take 1,000 Units by mouth in the morning.     clopidogrel (PLAVIX) 75 MG tablet Take 1 tablet (75 mg total) by mouth daily. 30 tablet 3   conjugated estrogens (PREMARIN) vaginal cream Place 1 Applicatorful vaginally. Twice a week.     escitalopram (LEXAPRO) 10 MG tablet Take 10 mg by mouth at bedtime.     felodipine (PLENDIL) 5 MG 24 hr tablet Take 5 mg by mouth daily.     gabapentin (NEURONTIN) 100 MG capsule Take 100-300 mg by mouth at bedtime.     indapamide (LOZOL) 2.5 MG tablet Take 2.5 mg by mouth daily.     levothyroxine (SYNTHROID, LEVOTHROID) 50 MCG tablet Take 50 mcg by mouth in the morning.     Magnesium 200 MG TABS Take 1 tablet by mouth daily.     methenamine (HIPREX) 1 g tablet Take 1 g by mouth 2 (two) times daily with a meal.     No current facility-administered medications on file prior to visit.    Allergies:   Allergies  Allergen  Reactions   Aleve [Naproxen] Other (See Comments)    Unknown reaction Per facility   Bee Venom Other (See Comments)    Unknown reaction Per facility    Cipro [Ciprofloxacin Hcl] Other (See Comments)    Unknown reaction  Per facility   Crestor [Rosuvastatin] Other (See Comments)    Myalgias    Iodine Other (See Comments)    Unknown reaction  Per facility   Lodine [Etodolac] Other (See Comments)    Unknown reaction Per facility   Sulfonamide Derivatives Other (See Comments)    Unknown reaction Per facility   Epipen [Epinephrine] Palpitations   Macrobid [Nitrofurantoin Macrocrystal] Rash   Metrocream [Metronidazole] Rash   Xanax [Alprazolam] Rash      OBJECTIVE:  Physical Exam  There were no vitals filed for this visit. There is no height or weight on file to calculate BMI. No results found.      No data to display           General: well developed, well nourished, seated, in no evident distress Head: head normocephalic and atraumatic.   Neck: supple with no carotid or supraclavicular bruits Cardiovascular: regular rate and rhythm, no murmurs Musculoskeletal: no deformity Skin:  no rash/petichiae Vascular:  Normal pulses all extremities   Neurologic Exam Mental Status: Awake and fully alert. Oriented to place and time. Recent and remote memory intact. Attention span, concentration and fund of knowledge appropriate. Mood and affect appropriate.  Cranial Nerves: Fundoscopic exam reveals sharp disc margins. Pupils equal, briskly reactive to light. Extraocular movements full without nystagmus. Visual fields full to confrontation. Hearing intact. Facial sensation intact. Face, tongue, palate moves normally and symmetrically.  Motor: Normal bulk and tone. Normal strength in all tested extremity muscles Sensory.: intact to touch , pinprick , position and vibratory sensation.  Coordination: Rapid alternating movements normal in all extremities. Finger-to-nose and  heel-to-shin performed accurately bilaterally. Gait and Station: Arises from chair without difficulty. Stance is normal. Gait demonstrates normal stride length and balance with ***. Tandem walk and heel toe ***.  Reflexes: 1+ and symmetric. Toes downgoing.     NIHSS  *** Modified Rankin  ***      ASSESSMENT: Autumn Allen is a 88 y.o. year old female with left MCA ischemic infarcts on 04/16/2022 of cryptogenic etiology. Vascular risk factors include HLD, history of TIA, CAD and OSA on CPAP.      PLAN:  Cryptogenic left MCA stroke:  Residual deficit: ***.  Continue Plavix and atorvastatin (Lipitor) for secondary stroke prevention.   Discussed secondary stroke prevention measures and importance of close PCP follow up for aggressive stroke risk factor management including BP goal<130/90, HLD with LDL goal<70 and DM with A1c.<7 .  Stroke labs 04/2022: LDL 115, A1c 6.1 I have gone over the pathophysiology of stroke, warning signs and symptoms, risk factors and their management in some detail with instructions to go to the closest emergency room for symptoms of concern.     Follow up in *** or call earlier if needed   CC:  [facility] provider: Dr. Leonie Man PCP: Burnard Bunting, MD    I spent *** minutes of face-to-face and non-face-to-face time with patient.  This included previsit chart review including review of recent hospitalization, lab review, study review, order entry, electronic health record documentation, patient education regarding recent stroke including etiology, secondary stroke prevention measures and importance of managing stroke risk factors, residual deficits and typical recovery time and answered all other questions to patient satisfaction   Frann Rider, AGNP-BC  [facility] [address] [place], [place] [address]  Phone [phone] Fax [phone] Note: This document was prepared with digital dictation and possible smart  phrase technology. Any transcriptional errors that result from this process are unintentional.

## 2022-05-19 ENCOUNTER — Encounter: Payer: Self-pay | Admitting: Adult Health

## 2022-05-19 ENCOUNTER — Ambulatory Visit (INDEPENDENT_AMBULATORY_CARE_PROVIDER_SITE_OTHER): Payer: Medicare PPO | Admitting: Adult Health

## 2022-05-19 VITALS — BP 139/64 | HR 44 | Ht 65.0 in | Wt 179.0 lb

## 2022-05-19 DIAGNOSIS — R2681 Unsteadiness on feet: Secondary | ICD-10-CM | POA: Diagnosis not present

## 2022-05-19 DIAGNOSIS — R41841 Cognitive communication deficit: Secondary | ICD-10-CM | POA: Diagnosis not present

## 2022-05-19 DIAGNOSIS — I69928 Other speech and language deficits following unspecified cerebrovascular disease: Secondary | ICD-10-CM | POA: Diagnosis not present

## 2022-05-19 DIAGNOSIS — Z9181 History of falling: Secondary | ICD-10-CM | POA: Diagnosis not present

## 2022-05-19 DIAGNOSIS — R278 Other lack of coordination: Secondary | ICD-10-CM | POA: Diagnosis not present

## 2022-05-19 DIAGNOSIS — R4701 Aphasia: Secondary | ICD-10-CM | POA: Diagnosis not present

## 2022-05-19 DIAGNOSIS — I63412 Cerebral infarction due to embolism of left middle cerebral artery: Secondary | ICD-10-CM

## 2022-05-19 DIAGNOSIS — M6281 Muscle weakness (generalized): Secondary | ICD-10-CM | POA: Diagnosis not present

## 2022-05-19 DIAGNOSIS — Z09 Encounter for follow-up examination after completed treatment for conditions other than malignant neoplasm: Secondary | ICD-10-CM

## 2022-05-19 NOTE — Patient Instructions (Addendum)
Continue working with speech therapy for hopeful ongoing recovery  Complete heart monitor which is to evaluate for an irregular heart rhythm which could have caused your recent stroke  Continue clopidogrel 75 mg daily  and atorvastatin  for secondary stroke prevention Stop aspirin at this time  Continue to follow up with PCP regarding cholesterol and blood pressure management  Maintain strict control of hypertension with blood pressure goal below 130/90 and cholesterol with LDL cholesterol (bad cholesterol) goal below 70 mg/dL.   Signs of a Stroke? Follow the BEFAST method:  Balance Watch for a sudden loss of balance, trouble with coordination or vertigo Eyes Is there a sudden loss of vision in one or both eyes? Or double vision?  Face: Ask the person to smile. Does one side of the face droop or is it numb?  Arms: Ask the person to raise both arms. Does one arm drift downward? Is there weakness or numbness of a leg? Speech: Ask the person to repeat a simple phrase. Does the speech sound slurred/strange? Is the person confused ? Time: If you observe any of these signs, call 911.    We can plan on having your follow up as needed at this time but if you have any questions or concerns regarding your stroke, please do not hesitate to reach out      [facility]. I hope we have been able to provide you high quality care today.  You may receive a patient satisfaction survey over the next few weeks. We would appreciate your feedback and comments so that we may continue to improve ourselves and the health of our patients.    Eating Plan After Stroke A stroke causes damage to brain cells, which can affect your ability to walk, talk, and eat. The impact of a stroke is different for everyone, and so is recovery. A good nutrition plan is important for your recovery. It can also lower your risk of another stroke. If you have difficulty chewing  and swallowing your food, work with a dietitian or your stroke care team to make sure that you are eating healthy foods and getting all the nutrients you need. What are tips for following this plan? Reading food labels Choose foods that have less than 300 milligrams (mg) of salt (sodium) per serving. Limit your sodium intake to less than 1,500 mg per day. Avoid foods that have saturated fat and trans fat. Choose foods that are low in cholesterol. Limit the amount of cholesterol you eat each day to less than 200 mg. Choose foods that are high in fiber. Eat 20-30 grams (g) of fiber each day. Avoid foods with added sugar. Check the food label for ingredients such as sugar, corn syrup, honey, fructose, molasses, and cane juice. Shopping Buy fresh, in-season fruits and vegetables from the grocery store or local farmers markets. Buy fresh seafood, poultry, lean meats, and eggs. Buy dry grains, beans, nuts, and seeds. Buy low-fat dairy products. Buy whole ingredients instead of prepackaged foods. Buy frozen fruits and vegetables in resealable bags. Cooking Prepare foods with very little salt. Use herbs or salt-free spices instead. Cook with heart-healthy oils, such as olive, avocado, canola, soybean, or sunflower oil. Avoid frying foods. Bake, grill, or broil foods instead. Remove visible fat and skin from meat and poultry before eating. Modify food textures as told by your health care provider. Meal planning  Eat a variety of colorful fruits and vegetables. Make sure  one-half of your plate is filled with fruits and vegetables at each meal. Eat fruits and vegetables that are high in potassium, such as: Apples, bananas, oranges, and melon. Sweet potatoes, spinach, zucchini, and tomatoes. Eat fish that contain heart-healthy fats (omega-3 fats) at least twice a week. These include salmon, tuna, mackerel, and sardines. Eat plant foods that are high in omega-3 fats, such as flaxseeds and walnuts. Add  these to cereals, yogurt, or pasta dishes. Eat several servings of high-fiber foods each day, such as fruits, vegetables, whole grains, and beans. Do not put salt on the table for meals. If you have difficulty swallowing: Choose foods that are softer and easier to chew and swallow. Cut foods into small pieces and chew well before swallowing. Thicken liquids as told by your health care provider or dietitian. Let your health care provider know if your condition does not improve over time. You may need to work with a speech therapist to retrain the muscles that are used for eating. Lifestyle When eating out at restaurants: Ask the server about low-salt or salt-free food options. Avoid fried foods. Look for menu items that are grilled, steamed, broiled, or roasted. Ask if your food can be prepared without butter. Ask for condiments such as salad dressings, gravy, or sauces to be served on the side. General recommendations Involve your family and friends in your recovery, if possible. It may be helpful to have a slower meal time and to plan meals that include foods everyone in the family can eat. Brush your teeth with fluoride toothpaste twice a day, and floss once a day. Keeping a clean mouth can help you swallow and can also help your appetite. Drink enough water to keep your urine pale yellow. If needed, set reminders or ask your family to help you remember to drink water. If you drink alcohol: Limit how much you have to: 0-1 drink a day for women who are not pregnant. 0-2 drinks a day for men. Know how much alcohol is in your drink. In the [place] one drink equals one 12 oz bottle of beer (355 mL), one 5 oz glass of wine (148 mL), or one 1 oz glass of hard liquor (44 mL). Summary Following this eating plan can help your stroke recovery and can decrease your risk of another stroke. Limit your salt (sodium) and cholesterol intake. Choose foods that are high in fiber. Let your health care  provider know if you have problems with swallowing. You may need to work with a speech therapist. This information is not intended to replace advice given to you by your health care provider. Make sure you discuss any questions you have with your health care provider. Document Revised: 07/07/2021 Document Reviewed: 07/07/2021 Elsevier Patient Education  2023 Elsevier Inc.    Stroke Prevention Some medical conditions and lifestyle choices can lead to a higher risk for a stroke. You can help to prevent a stroke by eating healthy foods and exercising. It also helps to not smoke and to manage any health problems you may have. How can this condition affect me? A stroke is an emergency. It should be treated right away. A stroke can lead to brain damage or threaten your life. There is a better chance of surviving and getting better after a stroke if you get medical help right away. What can increase my risk? The following medical conditions may increase your risk of a stroke: Diseases of the heart and blood vessels (cardiovascular disease). High blood pressure (hypertension).  Diabetes. High cholesterol. Sickle cell disease. Problems with blood clotting. Being very overweight. Sleeping problems (obstructivesleep apnea). Other risk factors include: Being older than age 70. A history of blood clots, stroke, or mini-stroke (TIA). Race, ethnic background, or a family history of stroke. Smoking or using tobacco products. Taking birth control pills, especially if you smoke. Heavy alcohol and drug use. Not being active. What actions can I take to prevent this? Manage your health conditions High cholesterol. Eat a healthy diet. If this is not enough to manage your cholesterol, you may need to take medicines. Take medicines as told by your doctor. High blood pressure. Try to keep your blood pressure below 130/80. If your blood pressure cannot be managed through a healthy diet and regular  exercise, you may need to take medicines. Take medicines as told by your doctor. Ask your doctor if you should check your blood pressure at home. Have your blood pressure checked every year. Diabetes. Eat a healthy diet and get regular exercise. If your blood sugar (glucose) cannot be managed through diet and exercise, you may need to take medicines. Take medicines as told by your doctor. Talk to your doctor about getting checked for sleeping problems. Signs of a problem can include: Snoring a lot. Feeling very tired. Make sure that you manage any other conditions you have. Nutrition  Follow instructions from your doctor about what to eat or drink. You may be told to: Eat and drink fewer calories each day. Limit how much salt (sodium) you use to 1,500 milligrams (mg) each day. Use only healthy fats for cooking, such as olive oil, canola oil, and sunflower oil. Eat healthy foods. To do this: Choose foods that are high in fiber. These include whole grains, and fresh fruits and vegetables. Eat at least 5 servings of fruits and vegetables a day. Try to fill one-half of your plate with fruits and vegetables at each meal. Choose low-fat (lean) proteins. These include low-fat cuts of meat, chicken without skin, fish, tofu, beans, and nuts. Eat low-fat dairy products. Avoid foods that: Are high in salt. Have saturated fat. Have trans fat. Have cholesterol. Are processed or pre-made. Count how many carbohydrates you eat and drink each day. Lifestyle If you drink alcohol: Limit how much you have to: 0-1 drink a day for women who are not pregnant. 0-2 drinks a day for men. Know how much alcohol is in your drink. In the [place] one drink equals one 12 oz bottle of beer ( ), one 5 oz glass of wine ( ), or one 1 oz glass of hard liquor (28mL). Do not smoke or use any products that have nicotine or tobacco. If you need help quitting, ask your doctor. Avoid secondhand smoke. Do not use  drugs. Activity  Try to stay at a healthy weight. Get at least 30 minutes of exercise on most days, such as: Fast walking. Biking. Swimming. Medicines Take over-the-counter and prescription medicines only as told by your doctor. Avoid taking birth control pills. Talk to your doctor about the risks of taking birth control pills if: You are over 23 years old. You smoke. You get very bad headaches. You have had a blood clot. Where to find more information American Stroke Association: www.strokeassociation.org Get help right away if: You or a loved one has any signs of a stroke. "BE FAST" is an easy way to remember the warning signs: B - Balance. Dizziness, sudden trouble walking, or loss of balance. E - Eyes. Trouble seeing or a  change in how you see. F - Face. Sudden weakness or loss of feeling of the face. The face or eyelid may droop on one side. A - Arms. Weakness or loss of feeling in an arm. This happens all of a sudden and most often on one side of the body. S - Speech. Sudden trouble speaking, slurred speech, or trouble understanding what people say. T - Time. Time to call emergency services. Write down what time symptoms started. You or a loved one has other signs of a stroke, such as: A sudden, very bad headache with no known cause. Feeling like you may vomit (nausea). Vomiting. A seizure. These symptoms may be an emergency. Get help right away. Call your local emergency services (911 in the [place] Do not wait to see if the symptoms will go away. Do not drive yourself to the hospital. Summary You can help to prevent a stroke by eating healthy, exercising, and not smoking. It also helps to manage any health problems you have. Do not smoke or use any products that contain nicotine or tobacco. Get help right away if you or a loved one has any signs of a stroke. This information is not intended to replace advice given to you by your health care provider. Make sure you discuss  any questions you have with your health care provider. Document Revised: 12/31/2019 Document Reviewed: 12/31/2019 Elsevier Patient Education  2023 [employer].

## 2022-05-20 DIAGNOSIS — M6281 Muscle weakness (generalized): Secondary | ICD-10-CM | POA: Diagnosis not present

## 2022-05-20 DIAGNOSIS — R41841 Cognitive communication deficit: Secondary | ICD-10-CM | POA: Diagnosis not present

## 2022-05-20 DIAGNOSIS — R278 Other lack of coordination: Secondary | ICD-10-CM | POA: Diagnosis not present

## 2022-05-20 DIAGNOSIS — R2681 Unsteadiness on feet: Secondary | ICD-10-CM | POA: Diagnosis not present

## 2022-05-20 DIAGNOSIS — Z9181 History of falling: Secondary | ICD-10-CM | POA: Diagnosis not present

## 2022-05-20 NOTE — Progress Notes (Signed)
I agree with the above plan 

## 2022-05-21 DIAGNOSIS — I69928 Other speech and language deficits following unspecified cerebrovascular disease: Secondary | ICD-10-CM | POA: Diagnosis not present

## 2022-05-21 DIAGNOSIS — M6281 Muscle weakness (generalized): Secondary | ICD-10-CM | POA: Diagnosis not present

## 2022-05-21 DIAGNOSIS — R41841 Cognitive communication deficit: Secondary | ICD-10-CM | POA: Diagnosis not present

## 2022-05-21 DIAGNOSIS — R278 Other lack of coordination: Secondary | ICD-10-CM | POA: Diagnosis not present

## 2022-05-24 DIAGNOSIS — M6281 Muscle weakness (generalized): Secondary | ICD-10-CM | POA: Diagnosis not present

## 2022-05-24 DIAGNOSIS — R278 Other lack of coordination: Secondary | ICD-10-CM | POA: Diagnosis not present

## 2022-05-24 DIAGNOSIS — R41841 Cognitive communication deficit: Secondary | ICD-10-CM | POA: Diagnosis not present

## 2022-05-24 DIAGNOSIS — R2681 Unsteadiness on feet: Secondary | ICD-10-CM | POA: Diagnosis not present

## 2022-05-24 DIAGNOSIS — Z9181 History of falling: Secondary | ICD-10-CM | POA: Diagnosis not present

## 2022-05-26 DIAGNOSIS — R41841 Cognitive communication deficit: Secondary | ICD-10-CM | POA: Diagnosis not present

## 2022-05-26 DIAGNOSIS — Z9181 History of falling: Secondary | ICD-10-CM | POA: Diagnosis not present

## 2022-05-26 DIAGNOSIS — M6281 Muscle weakness (generalized): Secondary | ICD-10-CM | POA: Diagnosis not present

## 2022-05-26 DIAGNOSIS — I69928 Other speech and language deficits following unspecified cerebrovascular disease: Secondary | ICD-10-CM | POA: Diagnosis not present

## 2022-05-26 DIAGNOSIS — R278 Other lack of coordination: Secondary | ICD-10-CM | POA: Diagnosis not present

## 2022-05-26 DIAGNOSIS — R2681 Unsteadiness on feet: Secondary | ICD-10-CM | POA: Diagnosis not present

## 2022-05-27 DIAGNOSIS — M6281 Muscle weakness (generalized): Secondary | ICD-10-CM | POA: Diagnosis not present

## 2022-05-27 DIAGNOSIS — Z9181 History of falling: Secondary | ICD-10-CM | POA: Diagnosis not present

## 2022-05-27 DIAGNOSIS — R278 Other lack of coordination: Secondary | ICD-10-CM | POA: Diagnosis not present

## 2022-05-27 DIAGNOSIS — R2681 Unsteadiness on feet: Secondary | ICD-10-CM | POA: Diagnosis not present

## 2022-05-27 DIAGNOSIS — R41841 Cognitive communication deficit: Secondary | ICD-10-CM | POA: Diagnosis not present

## 2022-05-28 DIAGNOSIS — M6281 Muscle weakness (generalized): Secondary | ICD-10-CM | POA: Diagnosis not present

## 2022-05-28 DIAGNOSIS — R278 Other lack of coordination: Secondary | ICD-10-CM | POA: Diagnosis not present

## 2022-05-28 DIAGNOSIS — R41841 Cognitive communication deficit: Secondary | ICD-10-CM | POA: Diagnosis not present

## 2022-05-28 DIAGNOSIS — I69928 Other speech and language deficits following unspecified cerebrovascular disease: Secondary | ICD-10-CM | POA: Diagnosis not present

## 2022-05-31 DIAGNOSIS — R2681 Unsteadiness on feet: Secondary | ICD-10-CM | POA: Diagnosis not present

## 2022-05-31 DIAGNOSIS — M6281 Muscle weakness (generalized): Secondary | ICD-10-CM | POA: Diagnosis not present

## 2022-05-31 DIAGNOSIS — R41841 Cognitive communication deficit: Secondary | ICD-10-CM | POA: Diagnosis not present

## 2022-05-31 DIAGNOSIS — Z9181 History of falling: Secondary | ICD-10-CM | POA: Diagnosis not present

## 2022-05-31 DIAGNOSIS — R278 Other lack of coordination: Secondary | ICD-10-CM | POA: Diagnosis not present

## 2022-06-01 DIAGNOSIS — M6281 Muscle weakness (generalized): Secondary | ICD-10-CM | POA: Diagnosis not present

## 2022-06-01 DIAGNOSIS — I69928 Other speech and language deficits following unspecified cerebrovascular disease: Secondary | ICD-10-CM | POA: Diagnosis not present

## 2022-06-01 DIAGNOSIS — R41841 Cognitive communication deficit: Secondary | ICD-10-CM | POA: Diagnosis not present

## 2022-06-01 DIAGNOSIS — R278 Other lack of coordination: Secondary | ICD-10-CM | POA: Diagnosis not present

## 2022-06-02 ENCOUNTER — Telehealth: Payer: Self-pay | Admitting: Neurology

## 2022-06-02 DIAGNOSIS — R41841 Cognitive communication deficit: Secondary | ICD-10-CM | POA: Diagnosis not present

## 2022-06-02 DIAGNOSIS — Z9181 History of falling: Secondary | ICD-10-CM | POA: Diagnosis not present

## 2022-06-02 DIAGNOSIS — R278 Other lack of coordination: Secondary | ICD-10-CM | POA: Diagnosis not present

## 2022-06-02 DIAGNOSIS — I63412 Cerebral infarction due to embolism of left middle cerebral artery: Secondary | ICD-10-CM

## 2022-06-02 DIAGNOSIS — G4730 Sleep apnea, unspecified: Secondary | ICD-10-CM

## 2022-06-02 DIAGNOSIS — I69928 Other speech and language deficits following unspecified cerebrovascular disease: Secondary | ICD-10-CM | POA: Diagnosis not present

## 2022-06-02 DIAGNOSIS — R4701 Aphasia: Secondary | ICD-10-CM

## 2022-06-02 DIAGNOSIS — M6281 Muscle weakness (generalized): Secondary | ICD-10-CM | POA: Diagnosis not present

## 2022-06-02 DIAGNOSIS — R2681 Unsteadiness on feet: Secondary | ICD-10-CM | POA: Diagnosis not present

## 2022-06-02 NOTE — Telephone Encounter (Signed)
Received a call from [facility] stating that patient must CPAP nightly and with the current settings. Order needs to be faxed atn to [facility] to [phone]

## 2022-06-03 DIAGNOSIS — M6281 Muscle weakness (generalized): Secondary | ICD-10-CM | POA: Diagnosis not present

## 2022-06-03 DIAGNOSIS — R2681 Unsteadiness on feet: Secondary | ICD-10-CM | POA: Diagnosis not present

## 2022-06-03 DIAGNOSIS — R41841 Cognitive communication deficit: Secondary | ICD-10-CM | POA: Diagnosis not present

## 2022-06-03 DIAGNOSIS — R278 Other lack of coordination: Secondary | ICD-10-CM | POA: Diagnosis not present

## 2022-06-03 DIAGNOSIS — Z9181 History of falling: Secondary | ICD-10-CM | POA: Diagnosis not present

## 2022-06-04 DIAGNOSIS — I69928 Other speech and language deficits following unspecified cerebrovascular disease: Secondary | ICD-10-CM | POA: Diagnosis not present

## 2022-06-04 DIAGNOSIS — R41841 Cognitive communication deficit: Secondary | ICD-10-CM | POA: Diagnosis not present

## 2022-06-04 DIAGNOSIS — R278 Other lack of coordination: Secondary | ICD-10-CM | POA: Diagnosis not present

## 2022-06-04 DIAGNOSIS — M6281 Muscle weakness (generalized): Secondary | ICD-10-CM | POA: Diagnosis not present

## 2022-06-09 ENCOUNTER — Telehealth: Payer: Self-pay

## 2022-06-09 DIAGNOSIS — R41841 Cognitive communication deficit: Secondary | ICD-10-CM | POA: Diagnosis not present

## 2022-06-09 DIAGNOSIS — I69928 Other speech and language deficits following unspecified cerebrovascular disease: Secondary | ICD-10-CM | POA: Diagnosis not present

## 2022-06-09 DIAGNOSIS — R278 Other lack of coordination: Secondary | ICD-10-CM | POA: Diagnosis not present

## 2022-06-09 DIAGNOSIS — M6281 Muscle weakness (generalized): Secondary | ICD-10-CM | POA: Diagnosis not present

## 2022-06-10 DIAGNOSIS — R41841 Cognitive communication deficit: Secondary | ICD-10-CM | POA: Diagnosis not present

## 2022-06-10 DIAGNOSIS — R2681 Unsteadiness on feet: Secondary | ICD-10-CM | POA: Diagnosis not present

## 2022-06-10 DIAGNOSIS — R278 Other lack of coordination: Secondary | ICD-10-CM | POA: Diagnosis not present

## 2022-06-10 DIAGNOSIS — Z9181 History of falling: Secondary | ICD-10-CM | POA: Diagnosis not present

## 2022-06-10 DIAGNOSIS — M6281 Muscle weakness (generalized): Secondary | ICD-10-CM | POA: Diagnosis not present

## 2022-06-11 DIAGNOSIS — I69928 Other speech and language deficits following unspecified cerebrovascular disease: Secondary | ICD-10-CM | POA: Diagnosis not present

## 2022-06-11 DIAGNOSIS — R2681 Unsteadiness on feet: Secondary | ICD-10-CM | POA: Diagnosis not present

## 2022-06-11 DIAGNOSIS — Z9181 History of falling: Secondary | ICD-10-CM | POA: Diagnosis not present

## 2022-06-11 DIAGNOSIS — R41841 Cognitive communication deficit: Secondary | ICD-10-CM | POA: Diagnosis not present

## 2022-06-11 DIAGNOSIS — M6281 Muscle weakness (generalized): Secondary | ICD-10-CM | POA: Diagnosis not present

## 2022-06-11 DIAGNOSIS — R278 Other lack of coordination: Secondary | ICD-10-CM | POA: Diagnosis not present

## 2022-06-14 DIAGNOSIS — R2681 Unsteadiness on feet: Secondary | ICD-10-CM | POA: Diagnosis not present

## 2022-06-14 DIAGNOSIS — M6281 Muscle weakness (generalized): Secondary | ICD-10-CM | POA: Diagnosis not present

## 2022-06-14 DIAGNOSIS — Z9181 History of falling: Secondary | ICD-10-CM | POA: Diagnosis not present

## 2022-06-14 DIAGNOSIS — R278 Other lack of coordination: Secondary | ICD-10-CM | POA: Diagnosis not present

## 2022-06-14 DIAGNOSIS — R41841 Cognitive communication deficit: Secondary | ICD-10-CM | POA: Diagnosis not present

## 2022-06-15 DIAGNOSIS — Z961 Presence of intraocular lens: Secondary | ICD-10-CM | POA: Diagnosis not present

## 2022-06-15 DIAGNOSIS — R278 Other lack of coordination: Secondary | ICD-10-CM | POA: Diagnosis not present

## 2022-06-15 DIAGNOSIS — Z9181 History of falling: Secondary | ICD-10-CM | POA: Diagnosis not present

## 2022-06-15 DIAGNOSIS — R2681 Unsteadiness on feet: Secondary | ICD-10-CM | POA: Diagnosis not present

## 2022-06-15 DIAGNOSIS — R41841 Cognitive communication deficit: Secondary | ICD-10-CM | POA: Diagnosis not present

## 2022-06-15 DIAGNOSIS — M6281 Muscle weakness (generalized): Secondary | ICD-10-CM | POA: Diagnosis not present

## 2022-06-16 DIAGNOSIS — R2681 Unsteadiness on feet: Secondary | ICD-10-CM | POA: Diagnosis not present

## 2022-06-16 DIAGNOSIS — R41841 Cognitive communication deficit: Secondary | ICD-10-CM | POA: Diagnosis not present

## 2022-06-16 DIAGNOSIS — M6281 Muscle weakness (generalized): Secondary | ICD-10-CM | POA: Diagnosis not present

## 2022-06-16 DIAGNOSIS — Z9181 History of falling: Secondary | ICD-10-CM | POA: Diagnosis not present

## 2022-06-16 DIAGNOSIS — I69928 Other speech and language deficits following unspecified cerebrovascular disease: Secondary | ICD-10-CM | POA: Diagnosis not present

## 2022-06-16 DIAGNOSIS — R278 Other lack of coordination: Secondary | ICD-10-CM | POA: Diagnosis not present

## 2022-06-17 ENCOUNTER — Non-Acute Institutional Stay: Payer: Medicare PPO | Admitting: Internal Medicine

## 2022-06-17 DIAGNOSIS — F32A Depression, unspecified: Secondary | ICD-10-CM | POA: Diagnosis not present

## 2022-06-17 DIAGNOSIS — M6281 Muscle weakness (generalized): Secondary | ICD-10-CM | POA: Diagnosis not present

## 2022-06-17 DIAGNOSIS — I10 Essential (primary) hypertension: Secondary | ICD-10-CM

## 2022-06-17 DIAGNOSIS — M79645 Pain in left finger(s): Secondary | ICD-10-CM

## 2022-06-17 DIAGNOSIS — E039 Hypothyroidism, unspecified: Secondary | ICD-10-CM

## 2022-06-17 DIAGNOSIS — I69928 Other speech and language deficits following unspecified cerebrovascular disease: Secondary | ICD-10-CM | POA: Diagnosis not present

## 2022-06-17 DIAGNOSIS — E785 Hyperlipidemia, unspecified: Secondary | ICD-10-CM | POA: Diagnosis not present

## 2022-06-17 DIAGNOSIS — Z8673 Personal history of transient ischemic attack (TIA), and cerebral infarction without residual deficits: Secondary | ICD-10-CM

## 2022-06-17 DIAGNOSIS — R278 Other lack of coordination: Secondary | ICD-10-CM | POA: Diagnosis not present

## 2022-06-17 DIAGNOSIS — R41841 Cognitive communication deficit: Secondary | ICD-10-CM | POA: Diagnosis not present

## 2022-06-18 ENCOUNTER — Encounter: Payer: Self-pay | Admitting: Internal Medicine

## 2022-06-18 DIAGNOSIS — R278 Other lack of coordination: Secondary | ICD-10-CM | POA: Diagnosis not present

## 2022-06-18 DIAGNOSIS — I69928 Other speech and language deficits following unspecified cerebrovascular disease: Secondary | ICD-10-CM | POA: Diagnosis not present

## 2022-06-18 DIAGNOSIS — R41841 Cognitive communication deficit: Secondary | ICD-10-CM | POA: Diagnosis not present

## 2022-06-18 NOTE — Progress Notes (Signed)
Location:  [facility] of Service:  ALF ([id number])  Provider:   Code Status: DNR Goals of Care:     04/16/2022    5:01 PM  Advanced Directives  Does Patient Have a Medical Advance Directive? Yes     Chief Complaint  Patient presents with   Chronic Care Management    HPI: Patient is a 89 y.o. female seen today for medical management of chronic diseases.    Admitted in AL from her apartment in [facility]  Patient has a history of hypertension, hyperlipidemia, sleep apnea, hypothyroidism  Was admitted in the hospital from 04/16/22 to 04/18/2022 for acute ischemic stroke involving left MCA territory.  Leading to confusion and word finding difficulty Also had a 2D echo which diagnosed her with moderate AS Patient was on aspirin and Plavix for 3 weeks and now only on Plavix She also had a cardiac monitor for 2 weeks which was negative for any acute issues Patient is now transferred to AL as she was unable to maintain her apartment  Her only issue today was pain in her left thumb of Left Hand No Injuries No Swelling  Also c/o Constipation  Walks with her walker   Past Medical History:  Diagnosis Date   Allergy    Aortic stenosis    a. mild to mod by Echo 07/2012   Arthritis    Asymptomatic varicose veins    Benign paroxysmal vertigo    Bradycardia, unspecified    Bronchitis, not specified as acute or chronic    Cardiac murmur    Cataracts, bilateral    Chest pain, unspecified    Chronic cystitis    Depression    Diarrhea    Dyspnea    Dysuria    Frequency of micturition    Frequent urination    Gait disturbance 06/10/2014   Gastric ulcer    Hearing loss    Hemorrhoids    History of bone density study    History of colonoscopy    History of frequent urinary tract infections    History of urinary tract infection    Hx of cardiovascular stress test    a. ETT-Echo 3/12:  EF 60%, normal study   Hx of echocardiogram    a. Echo 2/14:  Mild LVH, EF 60-65%,  Gr 1 diast dysfn, mild to mod AS, mean 17 mmHg, AVA 1.3 (VTI), trivial MR, mild LAE, PASP 31    Hyperlipemia    Hypertension    Hypothyroid    Left knee pain    Leg pain    Low back pain    Obstructive sleep apnea    OSA on CPAP    Other disorders of intestinal carbohydrate absorption    Other type of osteoarthritis, unspecified site    Overweight    Pain in thoracic spine    Palpitations    a. event monitor 3/14:  NSR, sinus brady   Plantar fasciitis    Plantar fasciitis    Right shoulder pain    S/P cholecystectomy    S/P TAH-BSO    Sleep apnea    Stroke (HCC)    tia 2016   TIA (transient ischemic attack)    Varicose veins of unspecified lower extremity with inflammation     Past Surgical History:  Procedure Laterality Date   ABDOMINAL HYSTERECTOMY     CHOLECYSTECTOMY     EP IMPLANTABLE DEVICE N/A 06/29/2016   Procedure: Loop Recorder Insertion;  Surgeon: Will Daphine Deutscher  Camnitz, MD;  Location: [facility];  Service: Cardiovascular;  Laterality: N/A;   FOOT SURGERY  1955   INCONTINENCE SURGERY     LOOP RECORDER REMOVAL N/A 08/10/2017   Procedure: LOOP RECORDER REMOVAL;  Surgeon: Constance Haw, MD;  Location: [facility];  Service: Cardiovascular;  Laterality: N/A;   TONSILLECTOMY      Allergies  Allergen Reactions   Aleve [Naproxen] Other (See Comments)    Unknown reaction Per facility   Bee Venom Other (See Comments)    Unknown reaction Per facility    Cipro [Ciprofloxacin Hcl] Other (See Comments)    Unknown reaction  Per facility   Crestor [Rosuvastatin] Other (See Comments)    Myalgias    Iodine Other (See Comments)    Unknown reaction  Per facility   Lodine [Etodolac] Other (See Comments)    Unknown reaction Per facility   Sulfonamide Derivatives Other (See Comments)    Unknown reaction Per facility   Epipen [Epinephrine] Palpitations   Macrobid [Nitrofurantoin Macrocrystal] Rash   Metrocream [Metronidazole] Rash   Xanax  [Alprazolam] Rash    Outpatient Encounter Medications as of 06/17/2022  Medication Sig   acetaminophen (TYLENOL) 500 MG tablet Take 1 tablet (500 mg total) by mouth every 8 (eight) hours as needed for moderate pain.   atorvastatin (LIPITOR) 40 MG tablet Take 1 tablet (40 mg total) by mouth daily.   Biotin 5000 MCG CAPS Take 10,000 mcg by mouth in the morning.   cholecalciferol (VITAMIN D) 1000 units tablet Take 1,000 Units by mouth in the morning.   clopidogrel (PLAVIX) 75 MG tablet Take 1 tablet (75 mg total) by mouth daily.   escitalopram (LEXAPRO) 10 MG tablet Take 10 mg by mouth at bedtime.   felodipine (PLENDIL) 5 MG 24 hr tablet Take 5 mg by mouth daily.   gabapentin (NEURONTIN) 100 MG capsule Take 100 mg by mouth at bedtime as needed.   indapamide (LOZOL) 2.5 MG tablet Take 2.5 mg by mouth daily.   levothyroxine (SYNTHROID, LEVOTHROID) 50 MCG tablet Take 50 mcg by mouth in the morning.   Magnesium 200 MG TABS Take 1 tablet by mouth daily.   [DISCONTINUED] conjugated estrogens (PREMARIN) vaginal cream Place 1 Applicatorful vaginally. Twice a week.   [DISCONTINUED] methenamine (HIPREX) 1 g tablet Take 1 g by mouth 2 (two) times daily with a meal.   No facility-administered encounter medications on file as of 06/17/2022.    Review of Systems:  Review of Systems  Constitutional:  Negative for activity change and appetite change.  HENT: Negative.    Respiratory:  Negative for cough and shortness of breath.   Cardiovascular:  Negative for leg swelling.  Gastrointestinal:  Positive for constipation.  Genitourinary: Negative.   Musculoskeletal:  Negative for arthralgias, gait problem and myalgias.  Skin: Negative.   Neurological:  Negative for dizziness and weakness.  Psychiatric/Behavioral:  Negative for confusion, dysphoric mood and sleep disturbance.     Health Maintenance  Topic Date Due   Medicare Annual Wellness (AWV)  Never done   Zoster Vaccines- Shingrix (1 of 2) Never done    Pneumonia Vaccine 69+ Years old (1 - PCV) Never done   INFLUENZA VACCINE  Never done   COVID-19 Vaccine (5 - 2023-24 season) 02/12/2022   DTaP/Tdap/Td (3 - Td or Tdap) 06/14/2025   DEXA SCAN  Completed   HPV VACCINES  Aged Out    Physical Exam: There were no vitals filed for this visit. There  is no height or weight on file to calculate BMI. Physical Exam Vitals reviewed.  Constitutional:      Appearance: Normal appearance.  HENT:     Head: Normocephalic.     Nose: Nose normal.     Mouth/Throat:     Mouth: Mucous membranes are moist.     Pharynx: Oropharynx is clear.  Eyes:     Pupils: Pupils are equal, round, and reactive to light.  Cardiovascular:     Rate and Rhythm: Normal rate and regular rhythm.     Pulses: Normal pulses.     Heart sounds: Normal heart sounds. No murmur heard. Pulmonary:     Effort: Pulmonary effort is normal.     Breath sounds: Normal breath sounds.  Abdominal:     General: Abdomen is flat. Bowel sounds are normal.     Palpations: Abdomen is soft.  Musculoskeletal:        General: No swelling.     Cervical back: Neck supple.     Comments: Left thumb No Swelling  Arthritis present  Skin:    General: Skin is warm.  Neurological:     General: No focal deficit present.     Mental Status: She is alert and oriented to person, place, and time.  Psychiatric:        Mood and Affect: Mood normal.        Thought Content: Thought content normal.     Labs reviewed: Basic Metabolic Panel: Recent Labs    04/16/22 1807 04/17/22 0849  NA 139 138  K 4.2 4.6  CL 100 101  CO2 30 25  GLUCOSE 98 175*  BUN 17 15  CREATININE 0.80 0.86  CALCIUM 9.5 8.9  MG  --  1.8  TSH  --  1.304   Liver Function Tests: Recent Labs    04/16/22 1807  AST 22  ALT 18  ALKPHOS 46  BILITOT 0.7  PROT 6.9  ALBUMIN 3.8   No results for input(s): "LIPASE", "AMYLASE" in the last 8760 hours. No results for input(s): "AMMONIA" in the last 8760 hours. CBC: Recent  Labs    04/16/22 1807 04/17/22 0849  WBC 8.5 5.7  NEUTROABS 5.1  --   HGB 15.0 14.9  HCT 46.4* 44.7  MCV 102.4* 99.6  PLT 222 207   Lipid Panel: Recent Labs    04/17/22 0500  CHOL 181  HDL 47  LDLCALC 115*  TRIG 94  CHOLHDL 3.9   Lab Results  Component Value Date   HGBA1C 6.4 (H) 04/17/2022    Procedures since last visit: Cardiac event monitor  Result Date: 05/27/2022 SInus rhythm with sinus bradycardia No atrial fibrillation <1% ventricular ectopy Will Camnitz, MD    Assessment/Plan 1. H/O: stroke Now on Plavix Cardiac monitor negative No focal deficits Working with therapy 2. Hyperlipidemia, unspecified hyperlipidemia type Atorvastatin was increased for high LDLS  3. Hypothyroidism, unspecified type TSH normal in 11/23  4. Essential hypertension On Plendil and Lozol  5. Depression, unspecified depression type Lexapro  6. Pain of left thumb Most likely arthritis Will do Xray 7 Sinus Bradycardia Cardiac Monitor was negative for any AV blocks 8 Sleep Apnea On CPAP  Labs/tests ordered:  * No order type specified * Next appt:  Visit date not found

## 2022-06-21 DIAGNOSIS — Z9181 History of falling: Secondary | ICD-10-CM | POA: Diagnosis not present

## 2022-06-21 DIAGNOSIS — R2681 Unsteadiness on feet: Secondary | ICD-10-CM | POA: Diagnosis not present

## 2022-06-21 DIAGNOSIS — R278 Other lack of coordination: Secondary | ICD-10-CM | POA: Diagnosis not present

## 2022-06-21 DIAGNOSIS — R41841 Cognitive communication deficit: Secondary | ICD-10-CM | POA: Diagnosis not present

## 2022-06-21 DIAGNOSIS — M6281 Muscle weakness (generalized): Secondary | ICD-10-CM | POA: Diagnosis not present

## 2022-06-22 DIAGNOSIS — R41841 Cognitive communication deficit: Secondary | ICD-10-CM | POA: Diagnosis not present

## 2022-06-22 DIAGNOSIS — R278 Other lack of coordination: Secondary | ICD-10-CM | POA: Diagnosis not present

## 2022-06-22 DIAGNOSIS — I69928 Other speech and language deficits following unspecified cerebrovascular disease: Secondary | ICD-10-CM | POA: Diagnosis not present

## 2022-06-22 DIAGNOSIS — M6281 Muscle weakness (generalized): Secondary | ICD-10-CM | POA: Diagnosis not present

## 2022-06-23 DIAGNOSIS — M6281 Muscle weakness (generalized): Secondary | ICD-10-CM | POA: Diagnosis not present

## 2022-06-23 DIAGNOSIS — I69928 Other speech and language deficits following unspecified cerebrovascular disease: Secondary | ICD-10-CM | POA: Diagnosis not present

## 2022-06-23 DIAGNOSIS — R278 Other lack of coordination: Secondary | ICD-10-CM | POA: Diagnosis not present

## 2022-06-23 DIAGNOSIS — Z9181 History of falling: Secondary | ICD-10-CM | POA: Diagnosis not present

## 2022-06-23 DIAGNOSIS — R2681 Unsteadiness on feet: Secondary | ICD-10-CM | POA: Diagnosis not present

## 2022-06-23 DIAGNOSIS — R41841 Cognitive communication deficit: Secondary | ICD-10-CM | POA: Diagnosis not present

## 2022-06-24 DIAGNOSIS — I69928 Other speech and language deficits following unspecified cerebrovascular disease: Secondary | ICD-10-CM | POA: Diagnosis not present

## 2022-06-24 DIAGNOSIS — R41841 Cognitive communication deficit: Secondary | ICD-10-CM | POA: Diagnosis not present

## 2022-06-24 DIAGNOSIS — R278 Other lack of coordination: Secondary | ICD-10-CM | POA: Diagnosis not present

## 2022-06-25 ENCOUNTER — Non-Acute Institutional Stay: Payer: Medicare PPO | Admitting: Orthopedic Surgery

## 2022-06-25 ENCOUNTER — Encounter: Payer: Self-pay | Admitting: Orthopedic Surgery

## 2022-06-25 DIAGNOSIS — M6281 Muscle weakness (generalized): Secondary | ICD-10-CM | POA: Diagnosis not present

## 2022-06-25 DIAGNOSIS — I69928 Other speech and language deficits following unspecified cerebrovascular disease: Secondary | ICD-10-CM | POA: Diagnosis not present

## 2022-06-25 DIAGNOSIS — R41841 Cognitive communication deficit: Secondary | ICD-10-CM | POA: Diagnosis not present

## 2022-06-25 DIAGNOSIS — Z0000 Encounter for general adult medical examination without abnormal findings: Secondary | ICD-10-CM | POA: Diagnosis not present

## 2022-06-25 DIAGNOSIS — R278 Other lack of coordination: Secondary | ICD-10-CM | POA: Diagnosis not present

## 2022-06-25 NOTE — Progress Notes (Signed)
Subjective:   Autumn Allen is a 89 y.o. female who presents for Medicare Annual (Subsequent) preventive examination.  Place of Service: [facility]- assisted living Provider: Hazle Nordmann, AGNP-C   Review of Systems     Cardiac Risk Factors include: advanced age (>79men, >67 women);hypertension;sedentary lifestyle     Objective:    Today's Vitals   06/25/22 1015  BP: 139/74  Pulse: 60  Resp: 20  Temp: 97.7 F (36.5 C)  SpO2: 94%  Weight: 174 lb 5 oz (79.1 kg)  Height: 5\' 5"  (1.651 m)   Body mass index is 29.01 kg/m.     06/25/2022   10:29 AM 04/16/2022    5:01 PM 12/08/2020    3:47 PM 12/05/2020    2:46 PM 12/03/2020    9:08 AM 06/29/2016    9:14 AM 06/25/2015    3:35 PM  Advanced Directives  Does Patient Have a Medical Advance Directive? Yes Yes No No No Yes Yes  Type of Advance Directive Living will     Healthcare Power of [place];Living will Healthcare Power of [place];Living will  Does patient want to make changes to medical advance directive? No - Patient declined     No - Patient declined   Copy of Healthcare Power of Attorney in Chart?      No - copy requested   Would patient like information on creating a medical advance directive?   No - Patient declined No - Patient declined No - Patient declined      Current Medications (verified) Outpatient Encounter Medications as of 06/25/2022  Medication Sig   acetaminophen (TYLENOL) 500 MG tablet Take 1 tablet (500 mg total) by mouth every 8 (eight) hours as needed for moderate pain.   atorvastatin (LIPITOR) 40 MG tablet Take 1 tablet (40 mg total) by mouth daily.   Biotin 5000 MCG CAPS Take 10,000 mcg by mouth in the morning.   cholecalciferol (VITAMIN D) 1000 units tablet Take 1,000 Units by mouth in the morning.   clopidogrel (PLAVIX) 75 MG tablet Take 1 tablet (75 mg total) by mouth daily.   escitalopram (LEXAPRO) 10 MG tablet Take 10 mg by mouth at bedtime.   felodipine (PLENDIL) 5 MG 24 hr tablet Take  5 mg by mouth daily.   gabapentin (NEURONTIN) 100 MG capsule Take 100 mg by mouth at bedtime as needed.   indapamide (LOZOL) 2.5 MG tablet Take 2.5 mg by mouth daily.   levothyroxine (SYNTHROID, LEVOTHROID) 50 MCG tablet Take 50 mcg by mouth in the morning.   Magnesium 200 MG TABS Take 1 tablet by mouth daily.   No facility-administered encounter medications on file as of 06/25/2022.    Allergies (verified) Aleve [naproxen], Bee venom, Cipro [ciprofloxacin hcl], Crestor [rosuvastatin], Iodine, Lodine [etodolac], Sulfonamide derivatives, Epipen [epinephrine], Macrobid [nitrofurantoin macrocrystal], Metrocream [metronidazole], and Xanax [alprazolam]   History: Past Medical History:  Diagnosis Date   Allergy    Aortic stenosis    a. mild to mod by Echo 07/2012   Arthritis    Asymptomatic varicose veins    Benign paroxysmal vertigo    Bradycardia, unspecified    Bronchitis, not specified as acute or chronic    Cardiac murmur    Cataracts, bilateral    Chest pain, unspecified    Chronic cystitis    Depression    Diarrhea    Dyspnea    Dysuria    Frequency of micturition    Frequent urination    Gait disturbance 06/10/2014  Gastric ulcer    Hearing loss    Hemorrhoids    History of bone density study    History of colonoscopy    History of frequent urinary tract infections    History of urinary tract infection    Hx of cardiovascular stress test    a. ETT-Echo 3/12:  EF 60%, normal study   Hx of echocardiogram    a. Echo 2/14:  Mild LVH, EF 60-65%, Gr 1 diast dysfn, mild to mod AS, mean 17 mmHg, AVA 1.3 (VTI), trivial MR, mild LAE, PASP 31    Hyperlipemia    Hypertension    Hypothyroid    Left knee pain    Leg pain    Low back pain    Obstructive sleep apnea    OSA on CPAP    Other disorders of intestinal carbohydrate absorption    Other type of osteoarthritis, unspecified site    Overweight    Pain in thoracic spine    Palpitations    a. event monitor 3/14:  NSR,  sinus brady   Plantar fasciitis    Plantar fasciitis    Right shoulder pain    S/P cholecystectomy    S/P TAH-BSO    Sleep apnea    Stroke (HCC)    tia 2016   TIA (transient ischemic attack)    Varicose veins of unspecified lower extremity with inflammation    Past Surgical History:  Procedure Laterality Date   ABDOMINAL HYSTERECTOMY     CHOLECYSTECTOMY     EP IMPLANTABLE DEVICE N/A 06/29/2016   Procedure: Loop Recorder Insertion;  Surgeon: Will Jorja Loa, MD;  Location: [facility];  Service: Cardiovascular;  Laterality: N/A;   FOOT SURGERY  1955   INCONTINENCE SURGERY     LOOP RECORDER REMOVAL N/A 08/10/2017   Procedure: LOOP RECORDER REMOVAL;  Surgeon: Regan Lemming, MD;  Location: [facility];  Service: Cardiovascular;  Laterality: N/A;   TONSILLECTOMY     Family History  Problem Relation Age of Onset   Heart attack Mother 60   Heart failure Mother    Arthritis Mother    Lung disease Father    Cancer - Prostate Father    Healthy Brother    Healthy Brother    Heart failure Maternal Uncle    Cancer Son    Social History   Socioeconomic History   Marital status: Widowed    Spouse name: Not on file   Number of children: 2   Years of education: college gr   Highest education level: Not on file  Occupational History   Occupation: retired Runner, broadcasting/film/video  Tobacco Use   Smoking status: Former    Years: 30.00    Types: Cigarettes   Smokeless tobacco: Never  Vaping Use   Vaping Use: Never used  Substance and Sexual Activity   Alcohol use: Yes    Alcohol/week: 1.0 standard drink of alcohol    Types: 1 Glasses of wine per week    Comment: wine sometimes   Drug use: No   Sexual activity: Not on file  Other Topics Concern   Not on file  Social History Narrative   Diet:      Caffeine:  2-3 cups of caffeine daily- decaf coffee.      Married, if yes what year: widow. Married 1955       Do you live in a house, apartment, assisted living, condo,  trailer, ect: [facility], independent living.  Is it one or more stories: one      How many persons live in your home? 1      Pets:cno      Highest level or education completed: college degree      Current/Past profession: retired Engineer, site      Exercise:  yes                Type and how often: daily         Living Will: Yes   DNR: Yes   POA/HPOA: Yes      Functional Status:   Do you have difficulty bathing or dressing yourself? No   Do you have difficulty preparing food or eating? No   Do you have difficulty managing your medications? No   Do you have difficulty managing your finances? No   Do you have difficulty affording your medications? No      Patient is right handed.    Social Determinants of Health   Financial Resource Strain: Low Risk  (06/25/2022)   Overall Financial Resource Strain (CARDIA)    Difficulty of Paying Living Expenses: Not hard at all  Food Insecurity: No Food Insecurity (06/25/2022)   Hunger Vital Sign    Worried About Running Out of Food in the Last Year: Never true    Ran Out of Food in the Last Year: Never true  Transportation Needs: No Transportation Needs (06/25/2022)   PRAPARE - Administrator, Civil Service (Medical): No    Lack of Transportation (Non-Medical): No  Physical Activity: Insufficiently Active (06/25/2022)   Exercise Vital Sign    Days of Exercise per Week: 3 days    Minutes of Exercise per Session: 40 min  Stress: No Stress Concern Present (06/25/2022)   [employer] of Occupational Health - Occupational Stress Questionnaire    Feeling of Stress : Not at all  Social Connections: Socially Isolated (06/25/2022)   Social Connection and Isolation Panel [NHANES]    Frequency of Communication with Friends and Family: Once a week    Frequency of Social Gatherings with Friends and Family: More than three times a week    Attends Religious Services: Never    Database administrator or Organizations: No     Attends Banker Meetings: Never    Marital Status: Widowed    Tobacco Counseling Counseling given: Not Answered   Clinical Intake:  Pre-visit preparation completed: No  Pain : No/denies pain     BMI - recorded: 29.01 Nutritional Status: BMI 25 -29 Overweight Nutritional Risks: None Diabetes: No  How often do you need to have someone help you when you read instructions, pamphlets, or other written materials from your doctor or pharmacy?: 3 - Sometimes What is the last grade level you completed in school?: [place] in education  Diabetic?No  Interpreter Needed?: No      Activities of Daily Living    06/25/2022    1:53 PM 04/17/2022    4:00 PM  In your present state of health, do you have any difficulty performing the following activities:  Hearing? 1 1  Vision? 0 1  Difficulty concentrating or making decisions? 0 1  Walking or climbing stairs? 0 1  Dressing or bathing? 0 0  Doing errands, shopping? 1 0  Preparing Food and eating ? Y   Using the Toilet? Y   In the past six months, have you accidently leaked urine? Y   Do you have problems with loss  of bowel control? N   Managing your Medications? Y   Managing your Finances? Y   Housekeeping or managing your Housekeeping? Y     Patient Care Team: Virgie Dad, MD as PCP - General (Internal Medicine) Constance Haw, MD as PCP - Cardiology (Cardiology) Katy Apo, MD as Consulting Physician (Ophthalmology) Sherlyn Hay, DO as Consulting Physician (Obstetrics and Gynecology) Dohmeier, Asencion Partridge, MD as Consulting Physician (Neurology) Izora Gala, MD as Consulting Physician (Otolaryngology)  Indicate any recent Medical Services you may have received from other than [facility] providers in the past year (date may be approximate).     Assessment:   This is a routine wellness examination for [place].  Hearing/Vision screen No results found.  Dietary issues and exercise activities  discussed: Current Exercise Habits: The patient does not participate in regular exercise at present, Exercise limited by: cardiac condition(s);neurologic condition(s)   Goals Addressed             This Visit's Progress    Maintain Mobility and Function   On track    Evidence-based guidance:  Emphasize the importance of physical activity and aerobic exercise as included in treatment plan; assess barriers to adherence; consider patient's abilities and preferences.  Encourage gradual increase in activity or exercise instead of stopping if pain occurs.  Reinforce individual therapy exercise prescription, such as strengthening, stabilization and stretching programs.  Promote optimal body mechanics to stabilize the spine with lifting and functional activity.  Encourage activity and mobility modifications to facilitate optimal function, such as using a log roll for bed mobility or dressing from a seated position.  Reinforce individual adaptive equipment recommendations to limit excessive spinal movements, such as a Systems analyst.  Assess adequacy of sleep; encourage use of sleep hygiene techniques, such as bedtime routine; use of white noise; dark, cool bedroom; avoiding daytime naps, heavy meals or exercise before bedtime.  Promote positions and modification to optimize sleep and sexual activity; consider pillows or positioning devices to assist in maintaining neutral spine.  Explore options for applying ergonomic principles at work and home, such as frequent position changes, using ergonomically designed equipment and working at optimal height.  Promote modifications to increase comfort with driving such as lumbar support, optimizing seat and steering wheel position, using cruise control and taking frequent rest stops to stretch and walk.   Notes:        Depression Screen    06/25/2022    1:52 PM 06/25/2022   10:28 AM 05/19/2022    9:51 AM  PHQ 2/9 Scores  PHQ - 2 Score 0 0 0     Fall Risk    06/25/2022    1:53 PM 06/25/2022   10:22 AM 12/08/2020    3:47 PM 12/05/2020    2:46 PM 12/03/2020    9:09 AM  [place] in the past year? 0 0 0 0 0  Number falls in past yr: 0 0 0 0 0  Injury with Fall? 0 0 0 0 0  Risk for fall due to : History of fall(s);Impaired balance/gait;Impaired mobility History of fall(s) No Fall Risks No Fall Risks   Follow up Falls evaluation completed;Education provided Falls evaluation completed Falls evaluation completed      [place]:  Any stairs in or around the home? No  If so, are there any without handrails? Yes  Home free of loose throw rugs in walkways, pet beds, electrical cords, etc? Yes  Adequate  lighting in your home to reduce risk of falls? Yes   ASSISTIVE DEVICES UTILIZED TO PREVENT FALLS:  Life alert? Yes  Use of a cane, walker or w/c? Yes  Grab bars in the bathroom? Yes  Shower chair or bench in shower? Yes  Elevated toilet seat or a handicapped toilet? Yes   TIMED UP AND GO:  Was the test performed? N/A.  Length of time to ambulate 10 feet: N/A sec.   Gait slow and steady with assistive device  Cognitive Function:    06/25/2022    1:55 PM  MMSE - Mini Mental State Exam  Orientation to time 5  Orientation to Place 5  Registration 3  Attention/ Calculation 5  Recall 3  Language- name 2 objects 2  Language- repeat 1  Language- follow 3 step command 3  Language- read & follow direction 1  Write a sentence 1  Copy design 1  Total score 30        Immunizations Immunization History  Administered Date(s) Administered   DTaP 09/30/1995   Moderna SARS-COV2 Booster Vaccination 03/25/2020, 11/25/2021   Moderna Sars-Covid-2 Vaccination 07/02/2019, 07/16/2019, 11/19/2020   Tdap 06/15/2015    TDAP status: Up to date  Flu Vaccine status: Up to date  Pneumococcal vaccine status: Up to date  Covid-19 vaccine status: Information provided on how to obtain vaccines.    Qualifies for Shingles Vaccine? Yes   Zostavax completed No   Shingrix Completed?: Yes  Screening Tests Health Maintenance  Topic Date Due   Zoster Vaccines- Shingrix (1 of 2) Never done   Pneumonia Vaccine 70+ Years old (1 - PCV) Never done   INFLUENZA VACCINE  Never done   COVID-19 Vaccine (6 - 2023-24 season) 02/12/2022   Medicare Annual Wellness (AWV)  06/26/2023   DTaP/Tdap/Td (3 - Td or Tdap) 06/14/2025   DEXA SCAN  Completed   HPV VACCINES  Aged Out    Health Maintenance  Health Maintenance Due  Topic Date Due   Zoster Vaccines- Shingrix (1 of 2) Never done   Pneumonia Vaccine 80+ Years old (1 - PCV) Never done   INFLUENZA VACCINE  Never done   COVID-19 Vaccine (6 - 2023-24 season) 02/12/2022    Colorectal cancer screening: No longer required.   Mammogram status: No longer required due to advanced age.  Bone Density status: Ordered 06/25/2022. Pt provided with contact info and advised to call to schedule appt.  Lung Cancer Screening: (Low Dose CT Chest recommended if Age 41-80 years, 30 pack-year currently smoking OR have quit w/in 15years.) does not qualify.   Lung Cancer Screening Referral: No  Additional Screening:  Hepatitis C Screening: does not qualify; Completed   Vision Screening: Recommended annual ophthalmology exams for early detection of glaucoma and other disorders of the eye. Is the patient up to date with their annual eye exam?  Yes  Who is the provider or what is the name of the office in which the patient attends annual eye exams? In house provider If pt is not established with a provider, would they like to be referred to a provider to establish care? No .   Dental Screening: Recommended annual dental exams for proper oral hygiene  Community Resource Referral / Chronic Care Management: CRR required this visit?  No   CCM required this visit?  No      Plan:     I have personally reviewed and noted the following in the patient's  chart:   Medical and social  history Use of alcohol, tobacco or illicit drugs  Current medications and supplements including opioid prescriptions. Patient is not currently taking opioid prescriptions. Functional ability and status Nutritional status Physical activity Advanced directives List of other physicians Hospitalizations, surgeries, and ER visits in previous 12 months Vitals Screenings to include cognitive, depression, and falls Referrals and appointments  In addition, I have reviewed and discussed with patient certain preventive protocols, quality metrics, and best practice recommendations. A written personalized care plan for preventive services as well as general preventive health recommendations were provided to patient.     Octavia Heir, NP   06/25/2022   Nurse Notes: wants RSV, recent covid and DEXA ordered

## 2022-06-27 DIAGNOSIS — M6281 Muscle weakness (generalized): Secondary | ICD-10-CM | POA: Diagnosis not present

## 2022-06-27 DIAGNOSIS — R278 Other lack of coordination: Secondary | ICD-10-CM | POA: Diagnosis not present

## 2022-06-27 DIAGNOSIS — R41841 Cognitive communication deficit: Secondary | ICD-10-CM | POA: Diagnosis not present

## 2022-06-27 DIAGNOSIS — Z9181 History of falling: Secondary | ICD-10-CM | POA: Diagnosis not present

## 2022-06-27 DIAGNOSIS — R2681 Unsteadiness on feet: Secondary | ICD-10-CM | POA: Diagnosis not present

## 2022-06-28 DIAGNOSIS — M6281 Muscle weakness (generalized): Secondary | ICD-10-CM | POA: Diagnosis not present

## 2022-06-28 DIAGNOSIS — R278 Other lack of coordination: Secondary | ICD-10-CM | POA: Diagnosis not present

## 2022-06-29 ENCOUNTER — Encounter: Payer: Self-pay | Admitting: Adult Health

## 2022-06-29 ENCOUNTER — Non-Acute Institutional Stay: Payer: Medicare PPO | Admitting: Adult Health

## 2022-06-29 DIAGNOSIS — F5101 Primary insomnia: Secondary | ICD-10-CM | POA: Diagnosis not present

## 2022-06-29 DIAGNOSIS — R278 Other lack of coordination: Secondary | ICD-10-CM | POA: Diagnosis not present

## 2022-06-29 DIAGNOSIS — R41841 Cognitive communication deficit: Secondary | ICD-10-CM | POA: Diagnosis not present

## 2022-06-29 DIAGNOSIS — I69928 Other speech and language deficits following unspecified cerebrovascular disease: Secondary | ICD-10-CM | POA: Diagnosis not present

## 2022-06-29 MED ORDER — TRAZODONE HCL 50 MG PO TABS: 25.0000 mg | ORAL_TABLET | Freq: Every evening | ORAL | 0 refills | Status: DC | PRN

## 2022-06-29 NOTE — Progress Notes (Signed)
Location:  [facility] Room Number: 10-A Place of Service:  SNF (31) Provider:  Durenda Age, DNP, FNP-BC  Patient Care Team: Virgie Dad, MD as PCP - General (Internal Medicine) Constance Haw, MD as PCP - Cardiology (Cardiology) Katy Apo, MD as Consulting Physician (Ophthalmology) Sherlyn Hay, DO as Consulting Physician (Obstetrics and Gynecology) Dohmeier, Asencion Partridge, MD as Consulting Physician (Neurology) Izora Gala, MD as Consulting Physician (Otolaryngology)  Extended Emergency Contact Information Primary Emergency Contact: [facility] Address: [address], [place] [address] Johnnette Litter of [place] Phone: [phone] Relation: Daughter Secondary Emergency Contact: Hale Bogus [place] of [place] Phone: [phone] Relation: Brother  Code Status:  DNR  Goals of care: Advanced Directive information    06/29/2022   11:09 AM  Advanced Directives  Does Patient Have a Medical Advance Directive? Yes  Type of Advance Directive Living will;Out of facility DNR (pink MOST or yellow form)  Does patient want to make changes to medical advance directive? No - Patient declined     Chief Complaint  Patient presents with   Acute Visit    Want something for sleep    HPI:  Pt is a 89 y.o. female seen today for an acute visit for occasional difficulty sleeping. She is a resident of [facility]. She was seen in her room today. She stated that she got 6 hours of sleep last night but there are times  that she has difficulty sleeping. She has a PMH of hypertension, hyperlipidemia, OSA and TIA.   Past Medical History:  Diagnosis Date   Allergy    Aortic stenosis    a. mild to mod by Echo 07/2012   Arthritis    Asymptomatic varicose veins    Benign paroxysmal vertigo    Bradycardia, unspecified    Bronchitis, not specified as acute or chronic    Cardiac murmur    Cataracts,  bilateral    Chest pain, unspecified    Chronic cystitis    Depression    Diarrhea    Dyspnea    Dysuria    Frequency of micturition    Frequent urination    Gait disturbance 06/10/2014   Gastric ulcer    Hearing loss    Hemorrhoids    History of bone density study    History of colonoscopy    History of frequent urinary tract infections    History of urinary tract infection    Hx of cardiovascular stress test    a. ETT-Echo 3/12:  EF 60%, normal study   Hx of echocardiogram    a. Echo 2/14:  Mild LVH, EF 60-65%, Gr 1 diast dysfn, mild to mod AS, mean 17 mmHg, AVA 1.3 (VTI), trivial MR, mild LAE, PASP 31    Hyperlipemia    Hypertension    Hypothyroid    Left knee pain    Leg pain    Low back pain    Obstructive sleep apnea    OSA on CPAP    Other disorders of intestinal carbohydrate absorption    Other type of osteoarthritis, unspecified site    Overweight    Pain in thoracic spine    Palpitations    a. event monitor 3/14:  NSR, sinus brady   Plantar fasciitis    Plantar fasciitis    Right shoulder pain    S/P cholecystectomy    S/P TAH-BSO  Sleep apnea    Stroke [facility])    tia 2016   TIA (transient ischemic attack)    Varicose veins of unspecified lower extremity with inflammation    Past Surgical History:  Procedure Laterality Date   ABDOMINAL HYSTERECTOMY     CHOLECYSTECTOMY     EP IMPLANTABLE DEVICE N/A 06/29/2016   Procedure: Loop Recorder Insertion;  Surgeon: Will Meredith Leeds, MD;  Location: [place] [facility];  Service: Cardiovascular;  Laterality: N/A;   FOOT SURGERY  1955   INCONTINENCE SURGERY     LOOP RECORDER REMOVAL N/A 08/10/2017   Procedure: LOOP RECORDER REMOVAL;  Surgeon: Constance Haw, MD;  Location: [place] [facility];  Service: Cardiovascular;  Laterality: N/A;   TONSILLECTOMY      Allergies  Allergen Reactions   Aleve [Naproxen] Other (See Comments)    Unknown reaction Per facility   Bee Venom Other (See Comments)     Unknown reaction Per facility    Cipro [Ciprofloxacin Hcl] Other (See Comments)    Unknown reaction  Per facility   Crestor [Rosuvastatin] Other (See Comments)    Myalgias    Iodine Other (See Comments)    Unknown reaction  Per facility   Lodine [Etodolac] Other (See Comments)    Unknown reaction Per facility   Sulfonamide Derivatives Other (See Comments)    Unknown reaction Per facility   Epipen [Epinephrine] Palpitations   Macrobid [Nitrofurantoin Macrocrystal] Rash   Metrocream [Metronidazole] Rash   Xanax [Alprazolam] Rash    Outpatient Encounter Medications as of 06/29/2022  Medication Sig   acetaminophen (TYLENOL) 500 MG tablet Take 1 tablet (500 mg total) by mouth every 8 (eight) hours as needed for moderate pain.   atorvastatin (LIPITOR) 40 MG tablet Take 1 tablet (40 mg total) by mouth daily.   Biotin 5000 MCG CAPS Take 10,000 mcg by mouth in the morning.   clopidogrel (PLAVIX) 75 MG tablet Take 1 tablet (75 mg total) by mouth daily.   DM-Benzocaine-Menthol (CHLORASEPTIC TOTAL) 10-17-08 MG LOZG Use as directed in the mouth or throat. Give 1 lozenge by mouth every 2 hours as needed for sore throat may use up to six times   docusate sodium (COLACE) 100 MG capsule Take 100 mg by mouth 2 (two) times daily.   escitalopram (LEXAPRO) 10 MG tablet Take 10 mg by mouth at bedtime.   felodipine (PLENDIL) 5 MG 24 hr tablet Take 5 mg by mouth daily.   gabapentin (NEURONTIN) 100 MG capsule Take 100 mg by mouth at bedtime as needed.   indapamide (LOZOL) 2.5 MG tablet Take 2.5 mg by mouth daily.   Lactobacillus Rhamnosus, GG, (CULTURELLE) CAPS Take 500 mg by mouth 2 (two) times daily as needed.   levothyroxine (SYNTHROID, LEVOTHROID) 50 MCG tablet Take 50 mcg by mouth in the morning.   Magnesium 200 MG TABS Take 1 tablet by mouth daily.   cholecalciferol (VITAMIN D) 1000 units tablet Take 1,000 Units by mouth in the morning.   No facility-administered encounter medications on file as of  06/29/2022.    Review of Systems  Constitutional:  Negative for appetite change, chills, fatigue and fever.  HENT:  Negative for congestion, hearing loss, rhinorrhea and sore throat.   Eyes: Negative.   Respiratory:  Negative for cough, shortness of breath and wheezing.   Cardiovascular:  Negative for chest pain, palpitations and leg swelling.  Gastrointestinal:  Negative for abdominal pain, constipation, diarrhea, nausea and vomiting.  Genitourinary:  Negative for dysuria.  Musculoskeletal:  Negative for arthralgias, back pain and myalgias.  Skin:  Negative for color change, rash and wound.  Neurological:  Negative for dizziness, weakness and headaches.  Psychiatric/Behavioral:  Positive for sleep disturbance. Negative for behavioral problems. The patient is not nervous/anxious.        Immunization History  Administered Date(s) Administered   DTaP 09/30/1995   Moderna SARS-COV2 Booster Vaccination 03/25/2020, 11/25/2021   Moderna Sars-Covid-2 Vaccination 07/02/2019, 07/16/2019, 11/19/2020   Tdap 06/15/2015   Pertinent  Health Maintenance Due  Topic Date Due   INFLUENZA VACCINE  Never done   DEXA SCAN  Completed      04/17/2022    4:00 PM 04/17/2022    9:00 PM 04/18/2022   10:00 AM 06/25/2022   10:22 AM 06/25/2022    1:53 PM  Fall Risk  Falls in the past year?    0 0  Was there an injury with Fall?    0 0  Fall Risk Category Calculator    0 0  Fall Risk Category (Retired)    Low Low  (RETIRED) Patient Fall Risk Level Moderate fall risk Moderate fall risk High fall risk High fall risk High fall risk  Patient at Risk for Falls Due to    History of fall(s) History of fall(s);Impaired balance/gait;Impaired mobility  Fall risk Follow up    Falls evaluation completed Falls evaluation completed;Education provided     Vitals:   06/29/22 1103  BP: 139/74  Pulse: 60  Resp: 20  Temp: 97.7 F (36.5 C)  SpO2: 94%  Weight: 174 lb 8 oz (79.2 kg)  Height: 5\' 5"  (1.651 m)   Body  mass index is 29.04 kg/m.  Physical Exam Constitutional:      Appearance: Normal appearance.  HENT:     Head: Normocephalic and atraumatic.     Nose: Nose normal.     Mouth/Throat:     Mouth: Mucous membranes are moist.  Eyes:     Conjunctiva/sclera: Conjunctivae normal.  Cardiovascular:     Rate and Rhythm: Normal rate and regular rhythm.     Heart sounds: Murmur heard.  Pulmonary:     Effort: Pulmonary effort is normal.     Breath sounds: Normal breath sounds.  Abdominal:     General: Bowel sounds are normal.     Palpations: Abdomen is soft.  Musculoskeletal:        General: Normal range of motion.     Cervical back: Normal range of motion.  Skin:    General: Skin is warm and dry.  Neurological:     General: No focal deficit present.     Mental Status: She is alert and oriented to person, place, and time.  Psychiatric:        Mood and Affect: Mood normal.        Behavior: Behavior normal.        Thought Content: Thought content normal.        Judgment: Judgment normal.      Labs reviewed: Recent Labs    04/16/22 1807 04/17/22 0849  NA 139 138  K 4.2 4.6  CL 100 101  CO2 30 25  GLUCOSE 98 175*  BUN 17 15  CREATININE 0.80 0.86  CALCIUM 9.5 8.9  MG  --  1.8   Recent Labs    04/16/22 1807  AST 22  ALT 18  ALKPHOS 46  BILITOT 0.7  PROT 6.9  ALBUMIN 3.8   Recent Labs    04/16/22 1807 04/17/22 0849  WBC 8.5 5.7  NEUTROABS 5.1  --   HGB 15.0 14.9  HCT 46.4* 44.7  MCV 102.4* 99.6  PLT 222 207   Lab Results  Component Value Date   TSH 1.304 04/17/2022   Lab Results  Component Value Date   HGBA1C 6.4 (H) 04/17/2022   Lab Results  Component Value Date   CHOL 181 04/17/2022   HDL 47 04/17/2022   LDLCALC 115 (H) 04/17/2022   LDLDIRECT 135.1 08/26/2010   TRIG 94 04/17/2022   CHOLHDL 3.9 04/17/2022    Significant Diagnostic Results in last 30 days:  No results found.  Assessment/Plan  1. Primary insomnia - traZODone (DESYREL) 50 MG  tablet; Take 0.5 tablets (25 mg total) by mouth at bedtime as needed for sleep.  Dispense: 15 tablet; Refill: 0    Family/ staff Communication: Discussed plan of care with resident and charge nurse  Labs/tests ordered:  None    Durenda Age, DNP, MSN, FNP-BC [facility] [phone] (Monday-Friday 8:00 a.m. - 5:00 p.m.) [phone] (after hours)

## 2022-06-30 DIAGNOSIS — R41841 Cognitive communication deficit: Secondary | ICD-10-CM | POA: Diagnosis not present

## 2022-06-30 DIAGNOSIS — M6281 Muscle weakness (generalized): Secondary | ICD-10-CM | POA: Diagnosis not present

## 2022-06-30 DIAGNOSIS — R2681 Unsteadiness on feet: Secondary | ICD-10-CM | POA: Diagnosis not present

## 2022-06-30 DIAGNOSIS — R278 Other lack of coordination: Secondary | ICD-10-CM | POA: Diagnosis not present

## 2022-06-30 DIAGNOSIS — Z9181 History of falling: Secondary | ICD-10-CM | POA: Diagnosis not present

## 2022-07-01 DIAGNOSIS — R41841 Cognitive communication deficit: Secondary | ICD-10-CM | POA: Diagnosis not present

## 2022-07-01 DIAGNOSIS — R278 Other lack of coordination: Secondary | ICD-10-CM | POA: Diagnosis not present

## 2022-07-01 DIAGNOSIS — I69928 Other speech and language deficits following unspecified cerebrovascular disease: Secondary | ICD-10-CM | POA: Diagnosis not present

## 2022-07-02 DIAGNOSIS — R41841 Cognitive communication deficit: Secondary | ICD-10-CM | POA: Diagnosis not present

## 2022-07-02 DIAGNOSIS — R2681 Unsteadiness on feet: Secondary | ICD-10-CM | POA: Diagnosis not present

## 2022-07-02 DIAGNOSIS — Z9181 History of falling: Secondary | ICD-10-CM | POA: Diagnosis not present

## 2022-07-02 DIAGNOSIS — R278 Other lack of coordination: Secondary | ICD-10-CM | POA: Diagnosis not present

## 2022-07-02 DIAGNOSIS — M6281 Muscle weakness (generalized): Secondary | ICD-10-CM | POA: Diagnosis not present

## 2022-07-04 DIAGNOSIS — Z9181 History of falling: Secondary | ICD-10-CM | POA: Diagnosis not present

## 2022-07-04 DIAGNOSIS — R278 Other lack of coordination: Secondary | ICD-10-CM | POA: Diagnosis not present

## 2022-07-04 DIAGNOSIS — R2681 Unsteadiness on feet: Secondary | ICD-10-CM | POA: Diagnosis not present

## 2022-07-04 DIAGNOSIS — R41841 Cognitive communication deficit: Secondary | ICD-10-CM | POA: Diagnosis not present

## 2022-07-04 DIAGNOSIS — M6281 Muscle weakness (generalized): Secondary | ICD-10-CM | POA: Diagnosis not present

## 2022-07-06 DIAGNOSIS — M6281 Muscle weakness (generalized): Secondary | ICD-10-CM | POA: Diagnosis not present

## 2022-07-06 DIAGNOSIS — R41841 Cognitive communication deficit: Secondary | ICD-10-CM | POA: Diagnosis not present

## 2022-07-06 DIAGNOSIS — I69928 Other speech and language deficits following unspecified cerebrovascular disease: Secondary | ICD-10-CM | POA: Diagnosis not present

## 2022-07-06 DIAGNOSIS — R278 Other lack of coordination: Secondary | ICD-10-CM | POA: Diagnosis not present

## 2022-07-07 DIAGNOSIS — R2681 Unsteadiness on feet: Secondary | ICD-10-CM | POA: Diagnosis not present

## 2022-07-07 DIAGNOSIS — Z9181 History of falling: Secondary | ICD-10-CM | POA: Diagnosis not present

## 2022-07-07 DIAGNOSIS — R41841 Cognitive communication deficit: Secondary | ICD-10-CM | POA: Diagnosis not present

## 2022-07-07 DIAGNOSIS — M6281 Muscle weakness (generalized): Secondary | ICD-10-CM | POA: Diagnosis not present

## 2022-07-07 DIAGNOSIS — I69928 Other speech and language deficits following unspecified cerebrovascular disease: Secondary | ICD-10-CM | POA: Diagnosis not present

## 2022-07-07 DIAGNOSIS — R278 Other lack of coordination: Secondary | ICD-10-CM | POA: Diagnosis not present

## 2022-07-08 DIAGNOSIS — R278 Other lack of coordination: Secondary | ICD-10-CM | POA: Diagnosis not present

## 2022-07-08 DIAGNOSIS — R41841 Cognitive communication deficit: Secondary | ICD-10-CM | POA: Diagnosis not present

## 2022-07-08 DIAGNOSIS — M6281 Muscle weakness (generalized): Secondary | ICD-10-CM | POA: Diagnosis not present

## 2022-07-08 DIAGNOSIS — I69928 Other speech and language deficits following unspecified cerebrovascular disease: Secondary | ICD-10-CM | POA: Diagnosis not present

## 2022-07-11 DIAGNOSIS — R278 Other lack of coordination: Secondary | ICD-10-CM | POA: Diagnosis not present

## 2022-07-11 DIAGNOSIS — R2681 Unsteadiness on feet: Secondary | ICD-10-CM | POA: Diagnosis not present

## 2022-07-11 DIAGNOSIS — M6281 Muscle weakness (generalized): Secondary | ICD-10-CM | POA: Diagnosis not present

## 2022-07-11 DIAGNOSIS — Z9181 History of falling: Secondary | ICD-10-CM | POA: Diagnosis not present

## 2022-07-11 DIAGNOSIS — R41841 Cognitive communication deficit: Secondary | ICD-10-CM | POA: Diagnosis not present

## 2022-07-12 DIAGNOSIS — R2681 Unsteadiness on feet: Secondary | ICD-10-CM | POA: Diagnosis not present

## 2022-07-12 DIAGNOSIS — Z9181 History of falling: Secondary | ICD-10-CM | POA: Diagnosis not present

## 2022-07-12 DIAGNOSIS — M6281 Muscle weakness (generalized): Secondary | ICD-10-CM | POA: Diagnosis not present

## 2022-07-12 DIAGNOSIS — R41841 Cognitive communication deficit: Secondary | ICD-10-CM | POA: Diagnosis not present

## 2022-07-12 DIAGNOSIS — R278 Other lack of coordination: Secondary | ICD-10-CM | POA: Diagnosis not present

## 2022-07-14 DIAGNOSIS — R2681 Unsteadiness on feet: Secondary | ICD-10-CM | POA: Diagnosis not present

## 2022-07-14 DIAGNOSIS — M6281 Muscle weakness (generalized): Secondary | ICD-10-CM | POA: Diagnosis not present

## 2022-07-14 DIAGNOSIS — Z9181 History of falling: Secondary | ICD-10-CM | POA: Diagnosis not present

## 2022-07-14 DIAGNOSIS — I69928 Other speech and language deficits following unspecified cerebrovascular disease: Secondary | ICD-10-CM | POA: Diagnosis not present

## 2022-07-14 DIAGNOSIS — R278 Other lack of coordination: Secondary | ICD-10-CM | POA: Diagnosis not present

## 2022-07-14 DIAGNOSIS — R41841 Cognitive communication deficit: Secondary | ICD-10-CM | POA: Diagnosis not present

## 2022-07-15 DIAGNOSIS — R41841 Cognitive communication deficit: Secondary | ICD-10-CM | POA: Diagnosis not present

## 2022-07-15 DIAGNOSIS — R278 Other lack of coordination: Secondary | ICD-10-CM | POA: Diagnosis not present

## 2022-07-15 DIAGNOSIS — R2681 Unsteadiness on feet: Secondary | ICD-10-CM | POA: Diagnosis not present

## 2022-07-15 DIAGNOSIS — I69928 Other speech and language deficits following unspecified cerebrovascular disease: Secondary | ICD-10-CM | POA: Diagnosis not present

## 2022-07-15 DIAGNOSIS — Z9181 History of falling: Secondary | ICD-10-CM | POA: Diagnosis not present

## 2022-07-15 DIAGNOSIS — M6281 Muscle weakness (generalized): Secondary | ICD-10-CM | POA: Diagnosis not present

## 2022-07-16 DIAGNOSIS — I69928 Other speech and language deficits following unspecified cerebrovascular disease: Secondary | ICD-10-CM | POA: Diagnosis not present

## 2022-07-16 DIAGNOSIS — R278 Other lack of coordination: Secondary | ICD-10-CM | POA: Diagnosis not present

## 2022-07-16 DIAGNOSIS — R41841 Cognitive communication deficit: Secondary | ICD-10-CM | POA: Diagnosis not present

## 2022-07-19 DIAGNOSIS — R278 Other lack of coordination: Secondary | ICD-10-CM | POA: Diagnosis not present

## 2022-07-19 DIAGNOSIS — Z9181 History of falling: Secondary | ICD-10-CM | POA: Diagnosis not present

## 2022-07-19 DIAGNOSIS — R41841 Cognitive communication deficit: Secondary | ICD-10-CM | POA: Diagnosis not present

## 2022-07-19 DIAGNOSIS — M6281 Muscle weakness (generalized): Secondary | ICD-10-CM | POA: Diagnosis not present

## 2022-07-19 DIAGNOSIS — R2681 Unsteadiness on feet: Secondary | ICD-10-CM | POA: Diagnosis not present

## 2022-07-20 DIAGNOSIS — R278 Other lack of coordination: Secondary | ICD-10-CM | POA: Diagnosis not present

## 2022-07-20 DIAGNOSIS — M6281 Muscle weakness (generalized): Secondary | ICD-10-CM | POA: Diagnosis not present

## 2022-07-20 DIAGNOSIS — R41841 Cognitive communication deficit: Secondary | ICD-10-CM | POA: Diagnosis not present

## 2022-07-20 DIAGNOSIS — I69928 Other speech and language deficits following unspecified cerebrovascular disease: Secondary | ICD-10-CM | POA: Diagnosis not present

## 2022-07-21 DIAGNOSIS — I69928 Other speech and language deficits following unspecified cerebrovascular disease: Secondary | ICD-10-CM | POA: Diagnosis not present

## 2022-07-21 DIAGNOSIS — R278 Other lack of coordination: Secondary | ICD-10-CM | POA: Diagnosis not present

## 2022-07-21 DIAGNOSIS — R2681 Unsteadiness on feet: Secondary | ICD-10-CM | POA: Diagnosis not present

## 2022-07-21 DIAGNOSIS — Z9181 History of falling: Secondary | ICD-10-CM | POA: Diagnosis not present

## 2022-07-21 DIAGNOSIS — R41841 Cognitive communication deficit: Secondary | ICD-10-CM | POA: Diagnosis not present

## 2022-07-21 DIAGNOSIS — M6281 Muscle weakness (generalized): Secondary | ICD-10-CM | POA: Diagnosis not present

## 2022-07-22 DIAGNOSIS — R41841 Cognitive communication deficit: Secondary | ICD-10-CM | POA: Diagnosis not present

## 2022-07-22 DIAGNOSIS — Z9181 History of falling: Secondary | ICD-10-CM | POA: Diagnosis not present

## 2022-07-22 DIAGNOSIS — R278 Other lack of coordination: Secondary | ICD-10-CM | POA: Diagnosis not present

## 2022-07-22 DIAGNOSIS — R2681 Unsteadiness on feet: Secondary | ICD-10-CM | POA: Diagnosis not present

## 2022-07-22 DIAGNOSIS — M6281 Muscle weakness (generalized): Secondary | ICD-10-CM | POA: Diagnosis not present

## 2022-07-23 DIAGNOSIS — R41841 Cognitive communication deficit: Secondary | ICD-10-CM | POA: Diagnosis not present

## 2022-07-23 DIAGNOSIS — R278 Other lack of coordination: Secondary | ICD-10-CM | POA: Diagnosis not present

## 2022-07-23 DIAGNOSIS — I69928 Other speech and language deficits following unspecified cerebrovascular disease: Secondary | ICD-10-CM | POA: Diagnosis not present

## 2022-07-26 DIAGNOSIS — I69928 Other speech and language deficits following unspecified cerebrovascular disease: Secondary | ICD-10-CM | POA: Diagnosis not present

## 2022-07-26 DIAGNOSIS — R2681 Unsteadiness on feet: Secondary | ICD-10-CM | POA: Diagnosis not present

## 2022-07-26 DIAGNOSIS — M6281 Muscle weakness (generalized): Secondary | ICD-10-CM | POA: Diagnosis not present

## 2022-07-26 DIAGNOSIS — Z9181 History of falling: Secondary | ICD-10-CM | POA: Diagnosis not present

## 2022-07-26 DIAGNOSIS — R41841 Cognitive communication deficit: Secondary | ICD-10-CM | POA: Diagnosis not present

## 2022-07-26 DIAGNOSIS — R278 Other lack of coordination: Secondary | ICD-10-CM | POA: Diagnosis not present

## 2022-07-27 DIAGNOSIS — M6281 Muscle weakness (generalized): Secondary | ICD-10-CM | POA: Diagnosis not present

## 2022-07-27 DIAGNOSIS — R278 Other lack of coordination: Secondary | ICD-10-CM | POA: Diagnosis not present

## 2022-07-29 DIAGNOSIS — M6281 Muscle weakness (generalized): Secondary | ICD-10-CM | POA: Diagnosis not present

## 2022-07-29 DIAGNOSIS — R278 Other lack of coordination: Secondary | ICD-10-CM | POA: Diagnosis not present

## 2022-08-02 DIAGNOSIS — I69928 Other speech and language deficits following unspecified cerebrovascular disease: Secondary | ICD-10-CM | POA: Diagnosis not present

## 2022-08-02 DIAGNOSIS — R41841 Cognitive communication deficit: Secondary | ICD-10-CM | POA: Diagnosis not present

## 2022-08-02 DIAGNOSIS — R2681 Unsteadiness on feet: Secondary | ICD-10-CM | POA: Diagnosis not present

## 2022-08-02 DIAGNOSIS — Z9181 History of falling: Secondary | ICD-10-CM | POA: Diagnosis not present

## 2022-08-02 DIAGNOSIS — R278 Other lack of coordination: Secondary | ICD-10-CM | POA: Diagnosis not present

## 2022-08-02 DIAGNOSIS — M6281 Muscle weakness (generalized): Secondary | ICD-10-CM | POA: Diagnosis not present

## 2022-08-04 DIAGNOSIS — R41841 Cognitive communication deficit: Secondary | ICD-10-CM | POA: Diagnosis not present

## 2022-08-04 DIAGNOSIS — I69928 Other speech and language deficits following unspecified cerebrovascular disease: Secondary | ICD-10-CM | POA: Diagnosis not present

## 2022-08-04 DIAGNOSIS — R278 Other lack of coordination: Secondary | ICD-10-CM | POA: Diagnosis not present

## 2022-08-05 DIAGNOSIS — R2681 Unsteadiness on feet: Secondary | ICD-10-CM | POA: Diagnosis not present

## 2022-08-05 DIAGNOSIS — M6281 Muscle weakness (generalized): Secondary | ICD-10-CM | POA: Diagnosis not present

## 2022-08-05 DIAGNOSIS — I10 Essential (primary) hypertension: Secondary | ICD-10-CM | POA: Diagnosis not present

## 2022-08-05 DIAGNOSIS — R278 Other lack of coordination: Secondary | ICD-10-CM | POA: Diagnosis not present

## 2022-08-05 DIAGNOSIS — R41841 Cognitive communication deficit: Secondary | ICD-10-CM | POA: Diagnosis not present

## 2022-08-05 DIAGNOSIS — Z9181 History of falling: Secondary | ICD-10-CM | POA: Diagnosis not present

## 2022-08-10 DIAGNOSIS — M6281 Muscle weakness (generalized): Secondary | ICD-10-CM | POA: Diagnosis not present

## 2022-08-10 DIAGNOSIS — R278 Other lack of coordination: Secondary | ICD-10-CM | POA: Diagnosis not present

## 2022-08-11 DIAGNOSIS — R41841 Cognitive communication deficit: Secondary | ICD-10-CM | POA: Diagnosis not present

## 2022-08-11 DIAGNOSIS — R278 Other lack of coordination: Secondary | ICD-10-CM | POA: Diagnosis not present

## 2022-08-11 DIAGNOSIS — I69928 Other speech and language deficits following unspecified cerebrovascular disease: Secondary | ICD-10-CM | POA: Diagnosis not present

## 2022-08-12 DIAGNOSIS — M6281 Muscle weakness (generalized): Secondary | ICD-10-CM | POA: Diagnosis not present

## 2022-08-12 DIAGNOSIS — I69928 Other speech and language deficits following unspecified cerebrovascular disease: Secondary | ICD-10-CM | POA: Diagnosis not present

## 2022-08-12 DIAGNOSIS — R41841 Cognitive communication deficit: Secondary | ICD-10-CM | POA: Diagnosis not present

## 2022-08-12 DIAGNOSIS — R2681 Unsteadiness on feet: Secondary | ICD-10-CM | POA: Diagnosis not present

## 2022-08-12 DIAGNOSIS — R278 Other lack of coordination: Secondary | ICD-10-CM | POA: Diagnosis not present

## 2022-08-12 DIAGNOSIS — Z9181 History of falling: Secondary | ICD-10-CM | POA: Diagnosis not present

## 2022-08-13 DIAGNOSIS — R278 Other lack of coordination: Secondary | ICD-10-CM | POA: Diagnosis not present

## 2022-08-13 DIAGNOSIS — M6281 Muscle weakness (generalized): Secondary | ICD-10-CM | POA: Diagnosis not present

## 2022-08-16 DIAGNOSIS — Z9181 History of falling: Secondary | ICD-10-CM | POA: Diagnosis not present

## 2022-08-16 DIAGNOSIS — R2681 Unsteadiness on feet: Secondary | ICD-10-CM | POA: Diagnosis not present

## 2022-08-16 DIAGNOSIS — R41841 Cognitive communication deficit: Secondary | ICD-10-CM | POA: Diagnosis not present

## 2022-08-16 DIAGNOSIS — R278 Other lack of coordination: Secondary | ICD-10-CM | POA: Diagnosis not present

## 2022-08-16 DIAGNOSIS — M6281 Muscle weakness (generalized): Secondary | ICD-10-CM | POA: Diagnosis not present

## 2022-08-17 DIAGNOSIS — R278 Other lack of coordination: Secondary | ICD-10-CM | POA: Diagnosis not present

## 2022-08-17 DIAGNOSIS — M6281 Muscle weakness (generalized): Secondary | ICD-10-CM | POA: Diagnosis not present

## 2022-08-18 DIAGNOSIS — I69928 Other speech and language deficits following unspecified cerebrovascular disease: Secondary | ICD-10-CM | POA: Diagnosis not present

## 2022-08-18 DIAGNOSIS — R278 Other lack of coordination: Secondary | ICD-10-CM | POA: Diagnosis not present

## 2022-08-18 DIAGNOSIS — R41841 Cognitive communication deficit: Secondary | ICD-10-CM | POA: Diagnosis not present

## 2022-08-19 ENCOUNTER — Encounter: Payer: Self-pay | Admitting: Orthopedic Surgery

## 2022-08-19 DIAGNOSIS — Z9181 History of falling: Secondary | ICD-10-CM | POA: Diagnosis not present

## 2022-08-19 DIAGNOSIS — R41841 Cognitive communication deficit: Secondary | ICD-10-CM | POA: Diagnosis not present

## 2022-08-19 DIAGNOSIS — R2681 Unsteadiness on feet: Secondary | ICD-10-CM | POA: Diagnosis not present

## 2022-08-19 DIAGNOSIS — R278 Other lack of coordination: Secondary | ICD-10-CM | POA: Diagnosis not present

## 2022-08-19 DIAGNOSIS — M6281 Muscle weakness (generalized): Secondary | ICD-10-CM | POA: Diagnosis not present

## 2022-08-20 DIAGNOSIS — R41841 Cognitive communication deficit: Secondary | ICD-10-CM | POA: Diagnosis not present

## 2022-08-20 DIAGNOSIS — R278 Other lack of coordination: Secondary | ICD-10-CM | POA: Diagnosis not present

## 2022-08-20 DIAGNOSIS — I69928 Other speech and language deficits following unspecified cerebrovascular disease: Secondary | ICD-10-CM | POA: Diagnosis not present

## 2022-08-24 DIAGNOSIS — R41841 Cognitive communication deficit: Secondary | ICD-10-CM | POA: Diagnosis not present

## 2022-08-24 DIAGNOSIS — R278 Other lack of coordination: Secondary | ICD-10-CM | POA: Diagnosis not present

## 2022-08-24 DIAGNOSIS — Z9181 History of falling: Secondary | ICD-10-CM | POA: Diagnosis not present

## 2022-08-24 DIAGNOSIS — R2681 Unsteadiness on feet: Secondary | ICD-10-CM | POA: Diagnosis not present

## 2022-08-24 DIAGNOSIS — M6281 Muscle weakness (generalized): Secondary | ICD-10-CM | POA: Diagnosis not present

## 2022-08-25 ENCOUNTER — Encounter: Payer: Self-pay | Admitting: Orthopedic Surgery

## 2022-08-25 ENCOUNTER — Non-Acute Institutional Stay: Payer: Medicare PPO | Admitting: Orthopedic Surgery

## 2022-08-25 DIAGNOSIS — W19XXXA Unspecified fall, initial encounter: Secondary | ICD-10-CM | POA: Diagnosis not present

## 2022-08-25 DIAGNOSIS — S51011A Laceration without foreign body of right elbow, initial encounter: Secondary | ICD-10-CM

## 2022-08-25 DIAGNOSIS — I69928 Other speech and language deficits following unspecified cerebrovascular disease: Secondary | ICD-10-CM | POA: Diagnosis not present

## 2022-08-25 DIAGNOSIS — F5101 Primary insomnia: Secondary | ICD-10-CM | POA: Diagnosis not present

## 2022-08-25 DIAGNOSIS — M6281 Muscle weakness (generalized): Secondary | ICD-10-CM | POA: Diagnosis not present

## 2022-08-25 DIAGNOSIS — R41841 Cognitive communication deficit: Secondary | ICD-10-CM | POA: Diagnosis not present

## 2022-08-25 DIAGNOSIS — R278 Other lack of coordination: Secondary | ICD-10-CM | POA: Diagnosis not present

## 2022-08-25 MED ORDER — TRAZODONE HCL 50 MG PO TABS: 25.0000 mg | ORAL_TABLET | Freq: Every day | ORAL | 0 refills | Status: DC

## 2022-08-25 NOTE — Progress Notes (Signed)
Location:  [facility] Room Number: 10/A Place of Service:  SNF [phone]) Provider:  Yvonna Alanis, NP   Virgie Dad, MD  Patient Care Team: Virgie Dad, MD as PCP - General (Internal Medicine) Constance Haw, MD as PCP - Cardiology (Cardiology) Katy Apo, MD as Consulting Physician (Ophthalmology) Sherlyn Hay, DO as Consulting Physician (Obstetrics and Gynecology) Dohmeier, Asencion Partridge, MD as Consulting Physician (Neurology) Izora Gala, MD as Consulting Physician (Otolaryngology)  Extended Emergency Contact Information Primary Emergency Contact: [facility] Address: [place], [place] [address] Johnnette Litter of [place] Phone: [phone] Mobile Phone: [phone] Relation: Daughter Secondary Emergency Contact: Encarnacion Slates Address: [place] of [place] Phone: [phone] Relation: Brother  Code Status:  DNR Goals of care: Advanced Directive information    06/29/2022   11:09 AM  Advanced Directives  Does Patient Have a Medical Advance Directive? Yes  Type of Advance Directive Living will;Out of facility DNR (pink MOST or yellow form)  Does patient want to make changes to medical advance directive? No - Patient declined     Chief Complaint  Patient presents with   Acute Visit    Skin tear, fall    HPI:  Pt is a 89 y.o. female seen today for acute visit due to recent skin tear.   She currently resides on the assisted living unit at [facility]. PMH: HTN, HLD, sleep apnea, hypothyroidism, ischemic stroke to Left MCA 04/2022, aortic stenosis, unstable gait, insomnia and depression.   This morning she was trying to get out of bed and ended up rolling off bed developing skin tear to right elbow. Nursing applied 5 steri strips and wrapped with pressure dressing. She denies pain to right elbow during encounter. She is able to move right elbow without difficulty.     Past Medical History:  Diagnosis Date   Allergy    Aortic stenosis    a. mild to mod by Echo 07/2012   Arthritis    Asymptomatic varicose veins    Benign paroxysmal vertigo    Bradycardia, unspecified    Bronchitis, not specified as acute or chronic    Cardiac murmur    Cataracts, bilateral    Chest pain, unspecified    Chronic cystitis    Depression    Diarrhea    Dyspnea    Dysuria    Frequency of micturition    Frequent urination    Gait disturbance 06/10/2014   Gastric ulcer    Hearing loss    Hemorrhoids    History of bone density study    History of colonoscopy    History of frequent urinary tract infections    History of urinary tract infection    Hx of cardiovascular stress test    a. ETT-Echo 3/12:  EF 60%, normal study   Hx of echocardiogram    a. Echo 2/14:  Mild LVH, EF 60-65%, Gr 1 diast dysfn, mild to mod AS, mean 17 mmHg, AVA 1.3 (VTI), trivial MR, mild LAE, PASP 31    Hyperlipemia    Hypertension    Hypothyroid    Left knee pain    Leg pain    Low back pain    Obstructive sleep apnea    OSA on CPAP    Other disorders of intestinal carbohydrate absorption    Other type of osteoarthritis, unspecified site    Overweight  Pain in thoracic spine    Palpitations    a. event monitor 3/14:  NSR, sinus brady   Plantar fasciitis    Plantar fasciitis    Right shoulder pain    S/P cholecystectomy    S/P TAH-BSO    Sleep apnea    Stroke ([place])    tia 2016   TIA (transient ischemic attack)    Varicose veins of unspecified lower extremity with inflammation    Past Surgical History:  Procedure Laterality Date   ABDOMINAL HYSTERECTOMY     CHOLECYSTECTOMY     EP IMPLANTABLE DEVICE N/A 06/29/2016   Procedure: Loop Recorder Insertion;  Surgeon: Will Meredith Leeds, MD;  Location: [place] [facility];  Service: Cardiovascular;  Laterality: N/A;   FOOT SURGERY  1955   INCONTINENCE SURGERY     LOOP RECORDER REMOVAL N/A 08/10/2017   Procedure: LOOP  RECORDER REMOVAL;  Surgeon: Constance Haw, MD;  Location: [place] [facility];  Service: Cardiovascular;  Laterality: N/A;   TONSILLECTOMY      Allergies  Allergen Reactions   Aleve [Naproxen] Other (See Comments)    Unknown reaction Per facility   Bee Venom Other (See Comments)    Unknown reaction Per facility    Cipro [Ciprofloxacin Hcl] Other (See Comments)    Unknown reaction  Per facility   Crestor [Rosuvastatin] Other (See Comments)    Myalgias    Iodine Other (See Comments)    Unknown reaction  Per facility   Lodine [Etodolac] Other (See Comments)    Unknown reaction Per facility   Sulfonamide Derivatives Other (See Comments)    Unknown reaction Per facility   Epipen [Epinephrine] Palpitations   Macrobid [Nitrofurantoin Macrocrystal] Rash   Metrocream [Metronidazole] Rash   Xanax [Alprazolam] Rash    Outpatient Encounter Medications as of 08/25/2022  Medication Sig   acetaminophen (TYLENOL) 500 MG tablet Take 1 tablet (500 mg total) by mouth every 8 (eight) hours as needed for moderate pain.   atorvastatin (LIPITOR) 40 MG tablet Take 1 tablet (40 mg total) by mouth daily.   Biotin 5000 MCG CAPS Take 10,000 mcg by mouth in the morning.   cholecalciferol (VITAMIN D) 1000 units tablet Take 1,000 Units by mouth in the morning.   clopidogrel (PLAVIX) 75 MG tablet Take 1 tablet (75 mg total) by mouth daily.   DM-Benzocaine-Menthol (CHLORASEPTIC TOTAL) 10-17-08 MG LOZG Use as directed in the mouth or throat. Give 1 lozenge by mouth every 2 hours as needed for sore throat may use up to six times   docusate sodium (COLACE) 100 MG capsule Take 100 mg by mouth 2 (two) times daily.   escitalopram (LEXAPRO) 10 MG tablet Take 10 mg by mouth at bedtime.   felodipine (PLENDIL) 5 MG 24 hr tablet Take 5 mg by mouth daily.   gabapentin (NEURONTIN) 100 MG capsule Take 100 mg by mouth at bedtime as needed.   indapamide (LOZOL) 2.5 MG tablet Take 2.5 mg by mouth daily.    Lactobacillus Rhamnosus, GG, (CULTURELLE) CAPS Take 500 mg by mouth 2 (two) times daily as needed.   levothyroxine (SYNTHROID, LEVOTHROID) 50 MCG tablet Take 50 mcg by mouth in the morning.   Magnesium 200 MG TABS Take 1 tablet by mouth daily.   traZODone (DESYREL) 50 MG tablet Take 0.5 tablets (25 mg total) by mouth at bedtime as needed for sleep.   No facility-administered encounter medications on file as of 08/25/2022.    Review of Systems  Constitutional:  Negative for activity change and appetite change.  Respiratory:  Negative for cough, shortness of breath and wheezing.   Cardiovascular:  Negative for chest pain and leg swelling.  Musculoskeletal:  Positive for gait problem.  Skin:  Positive for wound.  Psychiatric/Behavioral:  Positive for confusion. Negative for dysphoric mood. The patient is not nervous/anxious.     Immunization History  Administered Date(s) Administered   DTaP 09/30/1995   Moderna SARS-COV2 Booster Vaccination 03/25/2020, 11/25/2021   Moderna Sars-Covid-2 Vaccination 07/02/2019, 07/16/2019, 11/19/2020   Tdap 06/15/2015   Pertinent  Health Maintenance Due  Topic Date Due   INFLUENZA VACCINE  Never done   DEXA SCAN  Completed      04/17/2022    9:00 PM 04/18/2022   10:00 AM 06/25/2022   10:22 AM 06/25/2022    1:53 PM 08/25/2022   12:58 PM  [place] in the past year?   0 0 1  Was there an injury with Fall?   0 0 1  Fall Risk Category Calculator   0 0 2  Fall Risk Category (Retired)   Low Low   (RETIRED) Patient Fall Risk Level Moderate fall risk High fall risk High fall risk High fall risk   Patient at Risk for Falls Due to   History of fall(s) History of fall(s);Impaired balance/gait;Impaired mobility History of fall(s);Impaired balance/gait;Impaired mobility  Fall risk Follow up   Falls evaluation completed Falls evaluation completed;Education provided Falls evaluation completed;Education provided   Functional Status Survey:    Vitals:    08/25/22 1257  BP: (!) 142/60  Pulse: 63  Resp: 18  Temp: 98.2 F (36.8 C)  SpO2: 92%  Weight: 175 lb 12.8 oz (79.7 kg)  Height: '5\' 5"'$  (1.651 m)   Body mass index is 29.25 kg/m. Physical Exam Vitals reviewed.  Constitutional:      General: She is not in acute distress. HENT:     Head: Normocephalic.  Eyes:     General:        Right eye: No discharge.        Left eye: No discharge.  Cardiovascular:     Rate and Rhythm: Normal rate and regular rhythm.     Pulses: Normal pulses.     Heart sounds: Normal heart sounds.  Pulmonary:     Effort: Pulmonary effort is normal. No respiratory distress.     Breath sounds: Normal breath sounds. No wheezing.  Musculoskeletal:        General: Normal range of motion.     Right shoulder: No swelling, deformity or tenderness. Normal range of motion.     Right elbow: No swelling or deformity. Normal range of motion. No tenderness.     Right hand: No swelling, deformity or tenderness. Normal range of motion.     Cervical back: Neck supple.  Skin:    General: Skin is warm and dry.     Capillary Refill: Capillary refill takes less than 2 seconds.     Findings: Lesion present.     Comments: Approx 3-4 cm skin tear to right elbow over olecranon, 5 steri strips in place, dried blood present on gauze, no tenderness or swelling noted, surrounding skin intact.   Neurological:     General: No focal deficit present.     Mental Status: She is alert. Mental status is at baseline.     Motor: Weakness present.     Gait: Gait abnormal.  Psychiatric:        Mood and  Affect: Mood normal.     Labs reviewed: Recent Labs    04/16/22 1807 04/17/22 0849  NA 139 138  K 4.2 4.6  CL 100 101  CO2 30 25  GLUCOSE 98 175*  BUN 17 15  CREATININE 0.80 0.86  CALCIUM 9.5 8.9  MG  --  1.8   Recent Labs    04/16/22 1807  AST 22  ALT 18  ALKPHOS 46  BILITOT 0.7  PROT 6.9  ALBUMIN 3.8   Recent Labs    04/16/22 1807 04/17/22 0849  WBC 8.5 5.7   NEUTROABS 5.1  --   HGB 15.0 14.9  HCT 46.4* 44.7  MCV 102.4* 99.6  PLT 222 207   Lab Results  Component Value Date   TSH 1.304 04/17/2022   Lab Results  Component Value Date   HGBA1C 6.4 (H) 04/17/2022   Lab Results  Component Value Date   CHOL 181 04/17/2022   HDL 47 04/17/2022   LDLCALC 115 (H) 04/17/2022   LDLDIRECT 135.1 08/26/2010   TRIG 94 04/17/2022   CHOLHDL 3.9 04/17/2022    Significant Diagnostic Results in last 30 days:  No results found.  Assessment/Plan 1. Skin tear of right elbow without complication, initial encounter - accidentally rolled off bed this morning - Apprx 3-4 cm skin tear over right elbow - cleanse wound with saline, cover with non adherent gauze and cover with Kerlex QOD  - keep steri strips in place   2. Fall, initial encounter - right upper arm exam unremarkable> FROM - see dressing changes above - cont falls safety precautions    Family/ staff Communication: plan discussed with patient and nurse  Labs/tests ordered:  none

## 2022-08-27 DIAGNOSIS — R41841 Cognitive communication deficit: Secondary | ICD-10-CM | POA: Diagnosis not present

## 2022-08-27 DIAGNOSIS — M6281 Muscle weakness (generalized): Secondary | ICD-10-CM | POA: Diagnosis not present

## 2022-08-27 DIAGNOSIS — I69928 Other speech and language deficits following unspecified cerebrovascular disease: Secondary | ICD-10-CM | POA: Diagnosis not present

## 2022-08-27 DIAGNOSIS — R278 Other lack of coordination: Secondary | ICD-10-CM | POA: Diagnosis not present

## 2022-08-30 DIAGNOSIS — Z9181 History of falling: Secondary | ICD-10-CM | POA: Diagnosis not present

## 2022-08-30 DIAGNOSIS — R41841 Cognitive communication deficit: Secondary | ICD-10-CM | POA: Diagnosis not present

## 2022-08-30 DIAGNOSIS — R278 Other lack of coordination: Secondary | ICD-10-CM | POA: Diagnosis not present

## 2022-08-30 DIAGNOSIS — R2681 Unsteadiness on feet: Secondary | ICD-10-CM | POA: Diagnosis not present

## 2022-08-30 DIAGNOSIS — M6281 Muscle weakness (generalized): Secondary | ICD-10-CM | POA: Diagnosis not present

## 2022-08-31 ENCOUNTER — Telehealth: Payer: Medicare PPO

## 2022-08-31 DIAGNOSIS — M6281 Muscle weakness (generalized): Secondary | ICD-10-CM | POA: Diagnosis not present

## 2022-08-31 DIAGNOSIS — L570 Actinic keratosis: Secondary | ICD-10-CM | POA: Diagnosis not present

## 2022-08-31 DIAGNOSIS — R278 Other lack of coordination: Secondary | ICD-10-CM | POA: Diagnosis not present

## 2022-08-31 DIAGNOSIS — L814 Other melanin hyperpigmentation: Secondary | ICD-10-CM | POA: Diagnosis not present

## 2022-08-31 DIAGNOSIS — L821 Other seborrheic keratosis: Secondary | ICD-10-CM | POA: Diagnosis not present

## 2022-08-31 DIAGNOSIS — L853 Xerosis cutis: Secondary | ICD-10-CM | POA: Diagnosis not present

## 2022-08-31 NOTE — Telephone Encounter (Signed)
[facility] mailed form to office. Form is to be completed by provider. It is a cognitive history form. Form was placed in provider mailbox.  Message routed to Dr. Veleta Miners

## 2022-09-01 DIAGNOSIS — Z9181 History of falling: Secondary | ICD-10-CM | POA: Diagnosis not present

## 2022-09-01 DIAGNOSIS — I69928 Other speech and language deficits following unspecified cerebrovascular disease: Secondary | ICD-10-CM | POA: Diagnosis not present

## 2022-09-01 DIAGNOSIS — M6281 Muscle weakness (generalized): Secondary | ICD-10-CM | POA: Diagnosis not present

## 2022-09-01 DIAGNOSIS — R278 Other lack of coordination: Secondary | ICD-10-CM | POA: Diagnosis not present

## 2022-09-01 DIAGNOSIS — R41841 Cognitive communication deficit: Secondary | ICD-10-CM | POA: Diagnosis not present

## 2022-09-01 DIAGNOSIS — R2681 Unsteadiness on feet: Secondary | ICD-10-CM | POA: Diagnosis not present

## 2022-09-02 DIAGNOSIS — R278 Other lack of coordination: Secondary | ICD-10-CM | POA: Diagnosis not present

## 2022-09-02 DIAGNOSIS — M6281 Muscle weakness (generalized): Secondary | ICD-10-CM | POA: Diagnosis not present

## 2022-09-06 DIAGNOSIS — M6281 Muscle weakness (generalized): Secondary | ICD-10-CM | POA: Diagnosis not present

## 2022-09-06 DIAGNOSIS — Z9181 History of falling: Secondary | ICD-10-CM | POA: Diagnosis not present

## 2022-09-06 DIAGNOSIS — R278 Other lack of coordination: Secondary | ICD-10-CM | POA: Diagnosis not present

## 2022-09-06 DIAGNOSIS — R41841 Cognitive communication deficit: Secondary | ICD-10-CM | POA: Diagnosis not present

## 2022-09-06 DIAGNOSIS — R2681 Unsteadiness on feet: Secondary | ICD-10-CM | POA: Diagnosis not present

## 2022-09-07 DIAGNOSIS — R278 Other lack of coordination: Secondary | ICD-10-CM | POA: Diagnosis not present

## 2022-09-07 DIAGNOSIS — M6281 Muscle weakness (generalized): Secondary | ICD-10-CM | POA: Diagnosis not present

## 2022-09-08 ENCOUNTER — Encounter: Payer: Self-pay | Admitting: Internal Medicine

## 2022-09-08 DIAGNOSIS — I69928 Other speech and language deficits following unspecified cerebrovascular disease: Secondary | ICD-10-CM | POA: Diagnosis not present

## 2022-09-08 DIAGNOSIS — R278 Other lack of coordination: Secondary | ICD-10-CM | POA: Diagnosis not present

## 2022-09-08 DIAGNOSIS — R41841 Cognitive communication deficit: Secondary | ICD-10-CM | POA: Diagnosis not present

## 2022-09-09 DIAGNOSIS — R278 Other lack of coordination: Secondary | ICD-10-CM | POA: Diagnosis not present

## 2022-09-09 DIAGNOSIS — M6281 Muscle weakness (generalized): Secondary | ICD-10-CM | POA: Diagnosis not present

## 2022-09-10 DIAGNOSIS — R278 Other lack of coordination: Secondary | ICD-10-CM | POA: Diagnosis not present

## 2022-09-10 DIAGNOSIS — R41841 Cognitive communication deficit: Secondary | ICD-10-CM | POA: Diagnosis not present

## 2022-09-10 DIAGNOSIS — I69928 Other speech and language deficits following unspecified cerebrovascular disease: Secondary | ICD-10-CM | POA: Diagnosis not present

## 2022-09-13 ENCOUNTER — Encounter: Payer: Self-pay | Admitting: Orthopedic Surgery

## 2022-09-13 ENCOUNTER — Non-Acute Institutional Stay: Payer: Medicare PPO | Admitting: Orthopedic Surgery

## 2022-09-13 DIAGNOSIS — Z8673 Personal history of transient ischemic attack (TIA), and cerebral infarction without residual deficits: Secondary | ICD-10-CM | POA: Diagnosis not present

## 2022-09-13 DIAGNOSIS — F339 Major depressive disorder, recurrent, unspecified: Secondary | ICD-10-CM | POA: Diagnosis not present

## 2022-09-13 DIAGNOSIS — M6281 Muscle weakness (generalized): Secondary | ICD-10-CM | POA: Diagnosis not present

## 2022-09-13 DIAGNOSIS — S51011D Laceration without foreign body of right elbow, subsequent encounter: Secondary | ICD-10-CM | POA: Diagnosis not present

## 2022-09-13 DIAGNOSIS — R4189 Other symptoms and signs involving cognitive functions and awareness: Secondary | ICD-10-CM | POA: Diagnosis not present

## 2022-09-13 DIAGNOSIS — R2681 Unsteadiness on feet: Secondary | ICD-10-CM | POA: Diagnosis not present

## 2022-09-13 DIAGNOSIS — E785 Hyperlipidemia, unspecified: Secondary | ICD-10-CM

## 2022-09-13 DIAGNOSIS — R001 Bradycardia, unspecified: Secondary | ICD-10-CM | POA: Diagnosis not present

## 2022-09-13 DIAGNOSIS — I10 Essential (primary) hypertension: Secondary | ICD-10-CM | POA: Diagnosis not present

## 2022-09-13 DIAGNOSIS — E039 Hypothyroidism, unspecified: Secondary | ICD-10-CM | POA: Diagnosis not present

## 2022-09-13 DIAGNOSIS — R41841 Cognitive communication deficit: Secondary | ICD-10-CM | POA: Diagnosis not present

## 2022-09-13 DIAGNOSIS — F5101 Primary insomnia: Secondary | ICD-10-CM

## 2022-09-13 DIAGNOSIS — R278 Other lack of coordination: Secondary | ICD-10-CM | POA: Diagnosis not present

## 2022-09-13 DIAGNOSIS — Z9181 History of falling: Secondary | ICD-10-CM | POA: Diagnosis not present

## 2022-09-13 NOTE — Progress Notes (Signed)
Location:   [facility] Room Number: [place] of Service:  ALF [phone]) Provider:  Windell Moulding, NP  PCP: Virgie Dad, MD  Patient Care Team: Virgie Dad, MD as PCP - General (Internal Medicine) Constance Haw, MD as PCP - Cardiology (Cardiology) Katy Apo, MD as Consulting Physician (Ophthalmology) Sherlyn Hay, DO as Consulting Physician (Obstetrics and Gynecology) Dohmeier, Asencion Partridge, MD as Consulting Physician (Neurology) Izora Gala, MD as Consulting Physician (Otolaryngology)  Extended Emergency Contact Information Primary Emergency Contact: [facility] Address: [place], [place] [address] Johnnette Litter of [place] Phone: [phone] Mobile Phone: [phone] Relation: Daughter Secondary Emergency Contact: Encarnacion Slates Address: [place] of [place] Phone: [phone] Relation: Brother  Code Status:  DNR Goals of care: Advanced Directive information    09/13/2022   11:16 AM  Advanced Directives  Does Patient Have a Medical Advance Directive? Yes  Type of Paramedic of [place];Living will;Out of facility DNR (pink MOST or yellow form)  Does patient want to make changes to medical advance directive? No - Patient declined  Copy of [place] in Chart? Yes - validated most recent copy scanned in chart (See row information)     Chief Complaint  Patient presents with   Medical Management of Chronic Issues    Routine Visit.   Immunizations    Discuss the need for Shingrix vaccine, Pne vaccine, and Covid Booster.    HPI:  Pt is a 89 y.o. female seen today for medical management of chronic diseases.    She currently resides on the assisted living unit at [facility]. [place]: HTN, HLD, loop recorder removal 2019,  sleep apnea, hypothyroidism, ischemic stroke to Left MCA 04/2022, aortic stenosis, unstable gait, insomnia and  depression.   HTN- BUN/creat 15/0.86 04/17/2022, remains on felodipine HLD- Total 181, LDL 115 04/17/2022, remains on atorvastatin Bradycardia- HR averaging 45-70's, loop recorder removed 2019, evaluated by Dr. Gardiner Rhyme EKG sinus brady> no need for pacemaker  H/o stroke- 2 strokes in past per daughter,  ischemic stroke to left MCA 04/2022, remains on Plavix and atorvastatin Cognitive impairment- MMSE 29/30, lives in assisted living, no behaviors, requires assistance with ADLs like bathing/tolieting at times and medication management, not on medication Hypothyroidism- TSH 1.304, not on medication Depression- no mood changes, Na+ 138 04/2022, remains on Lexapro Insomnia- remains on melatonin and Trazodone Skin tear right elbow- 03/13 rolled OOB, denies right elbow pain today Unstable gait- currently working with PT, recently got a new rolator, bed now has side rail to grab  Recent blood pressures:  03/27- 126/61  03/20- 144/67  03/13- 138/55  Recent weights:  03/29- 176.4 lbs  02/02- 175.8 lbs  12/12- 174.5 lbs          Past Medical History:  Diagnosis Date   Allergy    Aortic stenosis    a. mild to mod by Echo 07/2012   Arthritis    Asymptomatic varicose veins    Benign paroxysmal vertigo    Bradycardia, unspecified    Bronchitis, not specified as acute or chronic    Cardiac murmur    Cataracts, bilateral    Chest pain, unspecified    Chronic cystitis    Depression    Diarrhea    Dyspnea    Dysuria    Frequency of micturition    Frequent urination    Gait disturbance  06/10/2014   Gastric ulcer    Hearing loss    Hemorrhoids    History of bone density study    History of colonoscopy    History of frequent urinary tract infections    History of urinary tract infection    Hx of cardiovascular stress test    a. ETT-Echo 3/12:  EF 60%, normal study   Hx of echocardiogram    a. Echo 2/14:  Mild LVH, EF 60-65%, Gr 1 diast dysfn, mild to mod AS, mean 17 mmHg, AVA 1.3  (VTI), trivial MR, mild LAE, PASP 31    Hyperlipemia    Hypertension    Hypothyroid    Left knee pain    Leg pain    Low back pain    Obstructive sleep apnea    OSA on CPAP    Other disorders of intestinal carbohydrate absorption    Other type of osteoarthritis, unspecified site    Overweight    Pain in thoracic spine    Palpitations    a. event monitor 3/14:  NSR, sinus brady   Plantar fasciitis    Plantar fasciitis    Right shoulder pain    S/P cholecystectomy    S/P TAH-BSO    Sleep apnea    Stroke    tia 2016   TIA (transient ischemic attack)    Varicose veins of unspecified lower extremity with inflammation    Past Surgical History:  Procedure Laterality Date   ABDOMINAL HYSTERECTOMY     CHOLECYSTECTOMY     EP IMPLANTABLE DEVICE N/A 06/29/2016   Procedure: Loop Recorder Insertion;  Surgeon: Will Meredith Leeds, MD;  Location: [facility];  Service: Cardiovascular;  Laterality: N/A;   FOOT SURGERY  1955   INCONTINENCE SURGERY     LOOP RECORDER REMOVAL N/A 08/10/2017   Procedure: LOOP RECORDER REMOVAL;  Surgeon: Constance Haw, MD;  Location: [facility];  Service: Cardiovascular;  Laterality: N/A;   TONSILLECTOMY      Allergies  Allergen Reactions   Bee Venom Other (See Comments)    Unknown reaction Per facility    Cipro [Ciprofloxacin Hcl] Other (See Comments)    Unknown reaction  Per facility   Crestor [Rosuvastatin] Other (See Comments)    Myalgias    Iodine Other (See Comments)    Unknown reaction  Per facility   Lodine [Etodolac] Other (See Comments)    Unknown reaction Per facility   Naprosyn [Naproxen] Other (See Comments)    Unknown reaction Per facility   Sulfonamide Derivatives Other (See Comments)    Unknown reaction Per facility   Tositumomab    Epipen [Epinephrine] Palpitations   Macrobid [Nitrofurantoin Macrocrystal] Rash   Metrocream [Metronidazole] Rash   Xanax [Alprazolam] Rash    Allergies as of 09/13/2022        Reactions   Bee Venom Other (See Comments)   Unknown reaction Per facility    Cipro [ciprofloxacin Hcl] Other (See Comments)   Unknown reaction  Per facility   Crestor [rosuvastatin] Other (See Comments)   Myalgias    Iodine Other (See Comments)   Unknown reaction  Per facility   Lodine [etodolac] Other (See Comments)   Unknown reaction Per facility   Naprosyn [naproxen] Other (See Comments)   Unknown reaction Per facility   Sulfonamide Derivatives Other (See Comments)   Unknown reaction Per facility   Tositumomab    Epipen [epinephrine] Palpitations   Macrobid [nitrofurantoin Macrocrystal] Rash   Metrocream [metronidazole] Rash  Xanax [alprazolam] Rash        Medication List        Accurate as of September 13, 2022 11:16 AM. If you have any questions, ask your nurse or doctor.          STOP taking these medications    cholecalciferol 1000 units tablet Commonly known as: VITAMIN D Stopped by: Yvonna Alanis, NP       TAKE these medications    acetaminophen 500 MG tablet Commonly known as: TYLENOL Take 1 tablet (500 mg total) by mouth every 8 (eight) hours as needed for moderate pain.   atorvastatin 40 MG tablet Commonly known as: LIPITOR Take 1 tablet (40 mg total) by mouth daily.   Biotin 5000 MCG Caps Take 10,000 mcg by mouth in the morning.   Chloraseptic Total 10-17-08 MG Lozg Generic drug: DM-Benzocaine-Menthol Use as directed in the mouth or throat. Give 1 lozenge by mouth every 2 hours as needed for sore throat may use up to six times   clopidogrel 75 MG tablet Commonly known as: PLAVIX Take 1 tablet (75 mg total) by mouth daily.   Culturelle Caps Take 500 mg by mouth 2 (two) times daily as needed.   docusate sodium 100 MG capsule Commonly known as: COLACE Take 100 mg by mouth 2 (two) times daily.   escitalopram 10 MG tablet Commonly known as: LEXAPRO Take 10 mg by mouth at bedtime.   felodipine 5 MG 24 hr tablet Commonly known as:  PLENDIL Take 5 mg by mouth daily.   gabapentin 100 MG capsule Commonly known as: NEURONTIN Take 100 mg by mouth at bedtime as needed.   indapamide 2.5 MG tablet Commonly known as: LOZOL Take 2.5 mg by mouth daily.   levothyroxine 50 MCG tablet Commonly known as: SYNTHROID Take 50 mcg by mouth in the morning.   Magnesium 200 MG Tabs Take 1 tablet by mouth daily.   melatonin 5 MG Tabs Take 5 mg by mouth daily.   polyethylene glycol 17 g packet Commonly known as: MIRALAX / GLYCOLAX Take 17 g by mouth as needed.   traZODone 50 MG tablet Commonly known as: DESYREL Take 50 mg by mouth daily. What changed: Another medication with the same name was removed. Continue taking this medication, and follow the directions you see here. Changed by: Yvonna Alanis, NP        Review of Systems  Constitutional:  Negative for activity change and appetite change.  HENT:  Negative for trouble swallowing.   Eyes:  Negative for visual disturbance.  Respiratory:  Negative for cough, shortness of breath and wheezing.   Cardiovascular:  Negative for chest pain and leg swelling.  Gastrointestinal:  Negative for abdominal distention and abdominal pain.  Genitourinary:  Negative for dysuria, frequency, hematuria and vaginal bleeding.  Musculoskeletal:  Positive for gait problem.  Skin:  Positive for wound.  Neurological:  Positive for weakness. Negative for dizziness and headaches.  Psychiatric/Behavioral:  Positive for confusion, dysphoric mood and sleep disturbance. The patient is not nervous/anxious.     Immunization History  Administered Date(s) Administered   DTaP 09/30/1995   Moderna SARS-COV2 Booster Vaccination 03/25/2020, 11/25/2021   Moderna Sars-Covid-2 Vaccination 07/02/2019, 07/16/2019, 11/19/2020   Respiratory Syncytial Virus Vaccine,Recomb Aduvanted(Arexvy) 06/26/2022   Tdap 06/15/2015   Pertinent  Health Maintenance Due  Topic Date Due   INFLUENZA VACCINE  01/13/2023   DEXA  SCAN  Completed      04/17/2022    9:00 PM  04/18/2022   10:00 AM 06/25/2022   10:22 AM 06/25/2022    1:53 PM 08/25/2022   12:58 PM  Fall Risk  Falls in the past year?   0 0 1  Was there an injury with Fall?   0 0 1  Fall Risk Category Calculator   0 0 2  Fall Risk Category (Retired)   Low Low   (RETIRED) Patient Fall Risk Level Moderate fall risk High fall risk High fall risk High fall risk   Patient at Risk for Falls Due to   History of fall(s) History of fall(s);Impaired balance/gait;Impaired mobility History of fall(s);Impaired balance/gait;Impaired mobility  Fall risk Follow up   Falls evaluation completed Falls evaluation completed;Education provided Falls evaluation completed;Education provided   Functional Status Survey:    Vitals:   09/13/22 1110  BP: 126/61  Pulse: (!) 46  Resp: 16  Temp: 98.1 F (36.7 C)  SpO2: 95%  Weight: 176 lb 6.4 oz (80 kg)  Height: 5\' 5"  (1.651 m)   Body mass index is 29.35 kg/m. Physical Exam Vitals reviewed.  Constitutional:      General: She is not in acute distress. HENT:     Head: Normocephalic.     Right Ear: There is no impacted cerumen.     Left Ear: There is no impacted cerumen.     Ears:     Comments: Bilateral hearing aids    Nose: Nose normal.     Mouth/Throat:     Mouth: Mucous membranes are moist.  Eyes:     General:        Right eye: No discharge.        Left eye: No discharge.  Neck:     Thyroid: No thyroid mass or thyromegaly.  Cardiovascular:     Rate and Rhythm: Bradycardia present.     Pulses: Normal pulses.     Heart sounds: Normal heart sounds.  Pulmonary:     Effort: Pulmonary effort is normal. No respiratory distress.     Breath sounds: Normal breath sounds. No wheezing.  Abdominal:     General: Bowel sounds are normal. There is no distension.     Palpations: Abdomen is soft.     Tenderness: There is no abdominal tenderness.  Musculoskeletal:     Cervical back: Neck supple.     Right lower leg: No  edema.     Left lower leg: No edema.  Lymphadenopathy:     Cervical: No cervical adenopathy.  Skin:    General: Skin is warm and dry.     Capillary Refill: Capillary refill takes less than 2 seconds.     Comments: Skin tear to right elbow CDI, no sign of infection  Neurological:     General: No focal deficit present.     Mental Status: She is alert. Mental status is at baseline.     Motor: Weakness present.     Gait: Gait abnormal.     Comments: rolator  Psychiatric:        Mood and Affect: Mood normal.        Behavior: Behavior normal.     Comments: Very pleasant, follows commands, alert to self/ familiar face/ place     Labs reviewed: Recent Labs    04/16/22 1807 04/17/22 0849  NA 139 138  K 4.2 4.6  CL 100 101  CO2 30 25  GLUCOSE 98 175*  BUN 17 15  CREATININE 0.80 0.86  CALCIUM 9.5 8.9  MG  --  1.8   Recent Labs    04/16/22 1807  AST 22  ALT 18  ALKPHOS 46  BILITOT 0.7  PROT 6.9  ALBUMIN 3.8   Recent Labs    04/16/22 1807 04/17/22 0849  WBC 8.5 5.7  NEUTROABS 5.1  --   HGB 15.0 14.9  HCT 46.4* 44.7  MCV 102.4* 99.6  PLT 222 207   Lab Results  Component Value Date   TSH 1.304 04/17/2022   Lab Results  Component Value Date   HGBA1C 6.4 (H) 04/17/2022   Lab Results  Component Value Date   CHOL 181 04/17/2022   HDL 47 04/17/2022   LDLCALC 115 (H) 04/17/2022   LDLDIRECT 135.1 08/26/2010   TRIG 94 04/17/2022   CHOLHDL 3.9 04/17/2022    Significant Diagnostic Results in last 30 days:  No results found.  Assessment/Plan 1. Essential hypertension - controlled with felodipine   2. Hyperlipidemia, unspecified hyperlipidemia type - LDL stable - cont atorvastatin  3. Bradycardia - HR averaging 40-60's - 04/2022 EKG sinus bradycardia> no workup for pacemaker per cardiology  4. H/O: stroke - 2 events in past per daughter - ischemic stroke to Left MCA 04/2022 - cont Plavix and statin  5. Cognitive impairment - MMSE 29/30 - doing  well in AL - needs assistance with ADLs/ medication management - no behaviors  6. Hypothyroidism, unspecified type - TSH stable  7. Recurrent depression - no mood changes - cont Lexapro  8. Primary insomnia - cont melatonin and Trazodone  9. Skin tear of right elbow without complication, subsequent encounter - no sign of infection   10. Unstable gait - working with PT - recently had hand rails put on bed - recently got new Firefighter Communication: plan discussed with patient and nurse  Labs/tests ordered: none

## 2022-09-14 ENCOUNTER — Encounter: Payer: Self-pay | Admitting: Orthopedic Surgery

## 2022-09-14 DIAGNOSIS — M6281 Muscle weakness (generalized): Secondary | ICD-10-CM | POA: Diagnosis not present

## 2022-09-14 DIAGNOSIS — R278 Other lack of coordination: Secondary | ICD-10-CM | POA: Diagnosis not present

## 2022-09-15 ENCOUNTER — Telehealth: Payer: Self-pay

## 2022-09-15 DIAGNOSIS — Z9181 History of falling: Secondary | ICD-10-CM | POA: Diagnosis not present

## 2022-09-15 DIAGNOSIS — R41841 Cognitive communication deficit: Secondary | ICD-10-CM | POA: Diagnosis not present

## 2022-09-15 DIAGNOSIS — R2681 Unsteadiness on feet: Secondary | ICD-10-CM | POA: Diagnosis not present

## 2022-09-15 DIAGNOSIS — R278 Other lack of coordination: Secondary | ICD-10-CM | POA: Diagnosis not present

## 2022-09-15 DIAGNOSIS — I69928 Other speech and language deficits following unspecified cerebrovascular disease: Secondary | ICD-10-CM | POA: Diagnosis not present

## 2022-09-15 DIAGNOSIS — M6281 Muscle weakness (generalized): Secondary | ICD-10-CM | POA: Diagnosis not present

## 2022-09-15 NOTE — Telephone Encounter (Signed)
Eddie Dibbles with [employer] called to check the status of Cognitive History Form. Per Jenny Reichmann he has spoken with Evie, CMA on multiple occasions and it has not been confirmed as to whether we have completed the paperwork and mailed it back to them.   I placed Eddie Dibbles on a brief hold and spoke with Evie and she mentioned that she has not been able to physically locate the paperwork and confirmed Paul's statement that she can not confirm that paperwork was mailed.  I checked Dr.Gupta's office mail box and was unable to locate paperwork. I resumed call with Eddie Dibbles and told him that the paperwork was not in Dr.Gupta's to do papers, which is an indicator that she has the paperwork and it may be in process of being mailed. I suggested that [place] call back at the beginning of next week if paperwork not received.   Shortly after ending call with Eddie Dibbles, Brooke/CMA who is assigned to assist Dr.Gupta stated that she had the paperwork, as Dr.Gupta gave it to her yesterday while working at a remote location and she plans to given to the administrative staff to mail.  Olivia Mackie in Systems [employer] has paperwork and plans to mail off today. A copy of paperwork was sent to scanning as well.

## 2022-09-17 DIAGNOSIS — R278 Other lack of coordination: Secondary | ICD-10-CM | POA: Diagnosis not present

## 2022-09-17 DIAGNOSIS — M6281 Muscle weakness (generalized): Secondary | ICD-10-CM | POA: Diagnosis not present

## 2022-09-20 DIAGNOSIS — Z9181 History of falling: Secondary | ICD-10-CM | POA: Diagnosis not present

## 2022-09-20 DIAGNOSIS — M6281 Muscle weakness (generalized): Secondary | ICD-10-CM | POA: Diagnosis not present

## 2022-09-20 DIAGNOSIS — R278 Other lack of coordination: Secondary | ICD-10-CM | POA: Diagnosis not present

## 2022-09-20 DIAGNOSIS — R41841 Cognitive communication deficit: Secondary | ICD-10-CM | POA: Diagnosis not present

## 2022-09-20 DIAGNOSIS — R2681 Unsteadiness on feet: Secondary | ICD-10-CM | POA: Diagnosis not present

## 2022-09-21 DIAGNOSIS — M6281 Muscle weakness (generalized): Secondary | ICD-10-CM | POA: Diagnosis not present

## 2022-09-21 DIAGNOSIS — R278 Other lack of coordination: Secondary | ICD-10-CM | POA: Diagnosis not present

## 2022-09-24 DIAGNOSIS — R278 Other lack of coordination: Secondary | ICD-10-CM | POA: Diagnosis not present

## 2022-09-24 DIAGNOSIS — R2681 Unsteadiness on feet: Secondary | ICD-10-CM | POA: Diagnosis not present

## 2022-09-24 DIAGNOSIS — R41841 Cognitive communication deficit: Secondary | ICD-10-CM | POA: Diagnosis not present

## 2022-09-24 DIAGNOSIS — M6281 Muscle weakness (generalized): Secondary | ICD-10-CM | POA: Diagnosis not present

## 2022-09-24 DIAGNOSIS — Z9181 History of falling: Secondary | ICD-10-CM | POA: Diagnosis not present

## 2022-09-24 DIAGNOSIS — I69928 Other speech and language deficits following unspecified cerebrovascular disease: Secondary | ICD-10-CM | POA: Diagnosis not present

## 2022-09-27 ENCOUNTER — Encounter: Payer: Self-pay | Admitting: Internal Medicine

## 2022-09-28 DIAGNOSIS — R278 Other lack of coordination: Secondary | ICD-10-CM | POA: Diagnosis not present

## 2022-09-28 DIAGNOSIS — M6281 Muscle weakness (generalized): Secondary | ICD-10-CM | POA: Diagnosis not present

## 2022-09-29 ENCOUNTER — Telehealth: Payer: Self-pay | Admitting: Internal Medicine

## 2022-09-29 DIAGNOSIS — M6281 Muscle weakness (generalized): Secondary | ICD-10-CM | POA: Diagnosis not present

## 2022-09-29 DIAGNOSIS — R41841 Cognitive communication deficit: Secondary | ICD-10-CM | POA: Diagnosis not present

## 2022-09-29 DIAGNOSIS — R278 Other lack of coordination: Secondary | ICD-10-CM | POA: Diagnosis not present

## 2022-09-29 DIAGNOSIS — Z9181 History of falling: Secondary | ICD-10-CM | POA: Diagnosis not present

## 2022-09-29 DIAGNOSIS — R2681 Unsteadiness on feet: Secondary | ICD-10-CM | POA: Diagnosis not present

## 2022-09-29 NOTE — Telephone Encounter (Signed)
Mailed office visit notes from 05/06/22 to 07/14/22 to [facility] ([phone], [place], [place] [address]), per request from CMA Nehemiah Settle).

## 2022-09-30 DIAGNOSIS — R41841 Cognitive communication deficit: Secondary | ICD-10-CM | POA: Diagnosis not present

## 2022-09-30 DIAGNOSIS — R278 Other lack of coordination: Secondary | ICD-10-CM | POA: Diagnosis not present

## 2022-09-30 DIAGNOSIS — I69928 Other speech and language deficits following unspecified cerebrovascular disease: Secondary | ICD-10-CM | POA: Diagnosis not present

## 2022-10-01 DIAGNOSIS — M6281 Muscle weakness (generalized): Secondary | ICD-10-CM | POA: Diagnosis not present

## 2022-10-01 DIAGNOSIS — R41841 Cognitive communication deficit: Secondary | ICD-10-CM | POA: Diagnosis not present

## 2022-10-01 DIAGNOSIS — R278 Other lack of coordination: Secondary | ICD-10-CM | POA: Diagnosis not present

## 2022-10-01 DIAGNOSIS — I69928 Other speech and language deficits following unspecified cerebrovascular disease: Secondary | ICD-10-CM | POA: Diagnosis not present

## 2022-10-04 DIAGNOSIS — R2681 Unsteadiness on feet: Secondary | ICD-10-CM | POA: Diagnosis not present

## 2022-10-04 DIAGNOSIS — R278 Other lack of coordination: Secondary | ICD-10-CM | POA: Diagnosis not present

## 2022-10-04 DIAGNOSIS — R41841 Cognitive communication deficit: Secondary | ICD-10-CM | POA: Diagnosis not present

## 2022-10-04 DIAGNOSIS — Z9181 History of falling: Secondary | ICD-10-CM | POA: Diagnosis not present

## 2022-10-04 DIAGNOSIS — M6281 Muscle weakness (generalized): Secondary | ICD-10-CM | POA: Diagnosis not present

## 2022-10-05 DIAGNOSIS — R278 Other lack of coordination: Secondary | ICD-10-CM | POA: Diagnosis not present

## 2022-10-05 DIAGNOSIS — M6281 Muscle weakness (generalized): Secondary | ICD-10-CM | POA: Diagnosis not present

## 2022-10-06 ENCOUNTER — Encounter: Payer: Self-pay | Admitting: Orthopedic Surgery

## 2022-10-06 DIAGNOSIS — R41841 Cognitive communication deficit: Secondary | ICD-10-CM | POA: Diagnosis not present

## 2022-10-06 DIAGNOSIS — M6281 Muscle weakness (generalized): Secondary | ICD-10-CM | POA: Diagnosis not present

## 2022-10-06 DIAGNOSIS — R278 Other lack of coordination: Secondary | ICD-10-CM | POA: Diagnosis not present

## 2022-10-06 DIAGNOSIS — Z9181 History of falling: Secondary | ICD-10-CM | POA: Diagnosis not present

## 2022-10-06 DIAGNOSIS — I69928 Other speech and language deficits following unspecified cerebrovascular disease: Secondary | ICD-10-CM | POA: Diagnosis not present

## 2022-10-06 DIAGNOSIS — R2681 Unsteadiness on feet: Secondary | ICD-10-CM | POA: Diagnosis not present

## 2022-10-07 DIAGNOSIS — R278 Other lack of coordination: Secondary | ICD-10-CM | POA: Diagnosis not present

## 2022-10-07 DIAGNOSIS — M6281 Muscle weakness (generalized): Secondary | ICD-10-CM | POA: Diagnosis not present

## 2022-10-08 DIAGNOSIS — I69928 Other speech and language deficits following unspecified cerebrovascular disease: Secondary | ICD-10-CM | POA: Diagnosis not present

## 2022-10-08 DIAGNOSIS — R41841 Cognitive communication deficit: Secondary | ICD-10-CM | POA: Diagnosis not present

## 2022-10-08 DIAGNOSIS — R278 Other lack of coordination: Secondary | ICD-10-CM | POA: Diagnosis not present

## 2022-10-11 ENCOUNTER — Other Ambulatory Visit: Payer: Self-pay | Admitting: Orthopedic Surgery

## 2022-10-11 ENCOUNTER — Telehealth: Payer: Self-pay | Admitting: Adult Health

## 2022-10-11 DIAGNOSIS — G4733 Obstructive sleep apnea (adult) (pediatric): Secondary | ICD-10-CM

## 2022-10-11 NOTE — Telephone Encounter (Signed)
Katie,RN @ [facility] [address] xt [phone] states pt is asking for a new CPAP, Katie, RN is unsure if pt has had the CPAP for 5 years or less.  The complaint is that it does not work like it once did, it is loud and uncomfortable.

## 2022-10-11 NOTE — Telephone Encounter (Signed)
Please advise [facility] it looks like she was set up in 2021. If she is having issues with machine, they will need to contact DME as they are the ones who manage equipment. If it is not working properly, they may need to replace. Otherwise, she is not eligible for new machine quite yet.  DME: [facility] [phone]

## 2022-10-12 NOTE — Telephone Encounter (Signed)
Called Katie at [facility]. Informed her of message [facility] sent. She stated she was going to follow-up with DME company.

## 2022-10-13 DIAGNOSIS — R278 Other lack of coordination: Secondary | ICD-10-CM | POA: Diagnosis not present

## 2022-10-13 DIAGNOSIS — R41841 Cognitive communication deficit: Secondary | ICD-10-CM | POA: Diagnosis not present

## 2022-10-13 DIAGNOSIS — F801 Expressive language disorder: Secondary | ICD-10-CM | POA: Diagnosis not present

## 2022-10-13 DIAGNOSIS — I69928 Other speech and language deficits following unspecified cerebrovascular disease: Secondary | ICD-10-CM | POA: Diagnosis not present

## 2022-10-15 DIAGNOSIS — R41841 Cognitive communication deficit: Secondary | ICD-10-CM | POA: Diagnosis not present

## 2022-10-15 DIAGNOSIS — I69928 Other speech and language deficits following unspecified cerebrovascular disease: Secondary | ICD-10-CM | POA: Diagnosis not present

## 2022-10-15 DIAGNOSIS — R278 Other lack of coordination: Secondary | ICD-10-CM | POA: Diagnosis not present

## 2022-10-15 DIAGNOSIS — F801 Expressive language disorder: Secondary | ICD-10-CM | POA: Diagnosis not present

## 2022-10-18 DIAGNOSIS — I69928 Other speech and language deficits following unspecified cerebrovascular disease: Secondary | ICD-10-CM | POA: Diagnosis not present

## 2022-10-18 DIAGNOSIS — R41841 Cognitive communication deficit: Secondary | ICD-10-CM | POA: Diagnosis not present

## 2022-10-18 DIAGNOSIS — R278 Other lack of coordination: Secondary | ICD-10-CM | POA: Diagnosis not present

## 2022-10-18 DIAGNOSIS — F801 Expressive language disorder: Secondary | ICD-10-CM | POA: Diagnosis not present

## 2022-10-22 DIAGNOSIS — F801 Expressive language disorder: Secondary | ICD-10-CM | POA: Diagnosis not present

## 2022-10-22 DIAGNOSIS — I69928 Other speech and language deficits following unspecified cerebrovascular disease: Secondary | ICD-10-CM | POA: Diagnosis not present

## 2022-10-22 DIAGNOSIS — R278 Other lack of coordination: Secondary | ICD-10-CM | POA: Diagnosis not present

## 2022-10-22 DIAGNOSIS — R41841 Cognitive communication deficit: Secondary | ICD-10-CM | POA: Diagnosis not present

## 2022-10-25 DIAGNOSIS — F411 Generalized anxiety disorder: Secondary | ICD-10-CM | POA: Diagnosis not present

## 2022-10-25 DIAGNOSIS — F3341 Major depressive disorder, recurrent, in partial remission: Secondary | ICD-10-CM | POA: Diagnosis not present

## 2022-10-26 DIAGNOSIS — R41841 Cognitive communication deficit: Secondary | ICD-10-CM | POA: Diagnosis not present

## 2022-10-26 DIAGNOSIS — I69928 Other speech and language deficits following unspecified cerebrovascular disease: Secondary | ICD-10-CM | POA: Diagnosis not present

## 2022-10-26 DIAGNOSIS — R278 Other lack of coordination: Secondary | ICD-10-CM | POA: Diagnosis not present

## 2022-10-26 DIAGNOSIS — F801 Expressive language disorder: Secondary | ICD-10-CM | POA: Diagnosis not present

## 2022-10-29 DIAGNOSIS — R278 Other lack of coordination: Secondary | ICD-10-CM | POA: Diagnosis not present

## 2022-10-29 DIAGNOSIS — R41841 Cognitive communication deficit: Secondary | ICD-10-CM | POA: Diagnosis not present

## 2022-10-29 DIAGNOSIS — F801 Expressive language disorder: Secondary | ICD-10-CM | POA: Diagnosis not present

## 2022-10-29 DIAGNOSIS — I69928 Other speech and language deficits following unspecified cerebrovascular disease: Secondary | ICD-10-CM | POA: Diagnosis not present

## 2022-11-01 DIAGNOSIS — F801 Expressive language disorder: Secondary | ICD-10-CM | POA: Diagnosis not present

## 2022-11-01 DIAGNOSIS — I69928 Other speech and language deficits following unspecified cerebrovascular disease: Secondary | ICD-10-CM | POA: Diagnosis not present

## 2022-11-01 DIAGNOSIS — R278 Other lack of coordination: Secondary | ICD-10-CM | POA: Diagnosis not present

## 2022-11-01 DIAGNOSIS — R41841 Cognitive communication deficit: Secondary | ICD-10-CM | POA: Diagnosis not present

## 2022-11-04 ENCOUNTER — Non-Acute Institutional Stay: Payer: Medicare PPO | Admitting: Orthopedic Surgery

## 2022-11-04 ENCOUNTER — Encounter: Payer: Self-pay | Admitting: Orthopedic Surgery

## 2022-11-04 DIAGNOSIS — R195 Other fecal abnormalities: Secondary | ICD-10-CM | POA: Diagnosis not present

## 2022-11-04 NOTE — Progress Notes (Signed)
Location:   [facility]  Nursing Home Room Number: 10-A Place of Service:  ALF [phone]) Provider:  Hazle Nordmann, NP  PCP: Mahlon Gammon, MD  Patient Care Team: Mahlon Gammon, MD as PCP - General (Internal Medicine) Regan Lemming, MD as PCP - Cardiology (Cardiology) Antony Contras, MD as Consulting Physician (Ophthalmology) Edwinna Areola, DO as Consulting Physician (Obstetrics and Gynecology) Dohmeier, Porfirio Mylar, MD as Consulting Physician (Neurology) Serena Colonel, MD as Consulting Physician (Otolaryngology)  Extended Emergency Contact Information Primary Emergency Contact: [facility] Address: [address]          [place], [place] [address] [place] Home Phone: [phone] Mobile Phone: [phone] Relation: Daughter Secondary Emergency Contact: Kerry Fort Address: [place] Home Phone: [phone] Relation: Brother  Code Status:  DNR Goals of care: Advanced Directive information    11/04/2022   10:25 AM  Advanced Directives  Does Patient Have a Medical Advance Directive? Yes  Type of Estate agent of [place];Living will;Out of facility DNR (pink MOST or yellow form)  Does patient want to make changes to medical advance directive? No - Patient declined  Copy of Healthcare Power of Attorney in Chart? Yes - validated most recent copy scanned in chart (See row information)     Chief Complaint  Patient presents with   Acute Visit    Loose stools.     HPI:  Pt is a 89 y.o. female seen today for an acute visit due to recent loose stools.   She currently resides on the assisted living unit at [facility] PMH: HTN, HLD, loop recorder removal 2019, sleep apnea, hypothyroidism, ischemic stroke to Left MCA 04/2022, aortic stenosis, unstable gait, insomnia and depression.   Loose stools x 2 days. She has been refusing colace in evening per nursing. She is also receiving  miralax every other day. She denies abdominal pain, N/V, diarrhea, fever, cold symptoms.    Past Medical History:  Diagnosis Date   Allergy    Aortic stenosis    a. mild to mod by Echo 07/2012   Arthritis    Asymptomatic varicose veins    Benign paroxysmal vertigo    Bradycardia, unspecified    Bronchitis, not specified as acute or chronic    Cardiac murmur    Cataracts, bilateral    Chest pain, unspecified    Chronic cystitis    Depression    Diarrhea    Dyspnea    Dysuria    Frequency of micturition    Frequent urination    Gait disturbance 06/10/2014   Gastric ulcer    Hearing loss    Hemorrhoids    History of bone density study    History of colonoscopy    History of frequent urinary tract infections    History of urinary tract infection    Hx of cardiovascular stress test    a. ETT-Echo 3/12:  EF 60%, normal study   Hx of echocardiogram    a. Echo 2/14:  Mild LVH, EF 60-65%, Gr 1 diast dysfn, mild to mod AS, mean 17 mmHg, AVA 1.3 (VTI), trivial MR, mild LAE, PASP 31    Hyperlipemia    Hypertension    Hypothyroid    Left knee pain    Leg pain    Low back pain    Obstructive sleep apnea    OSA on CPAP    Other disorders of intestinal carbohydrate absorption  Other type of osteoarthritis, unspecified site    Overweight    Pain in thoracic spine    Palpitations    a. event monitor 3/14:  NSR, sinus brady   Plantar fasciitis    Plantar fasciitis    Right shoulder pain    S/P cholecystectomy    S/P TAH-BSO    Sleep apnea    Stroke (HCC)    tia 2016   TIA (transient ischemic attack)    Varicose veins of unspecified lower extremity with inflammation    Past Surgical History:  Procedure Laterality Date   ABDOMINAL HYSTERECTOMY     CHOLECYSTECTOMY     EP IMPLANTABLE DEVICE N/A 06/29/2016   Procedure: Loop Recorder Insertion;  Surgeon: Will Jorja Loa, MD;  Location: [facility] IN[facility];  Service: Cardiovascular;  Laterality: N/A;   FOOT SURGERY  1955    INCONTINENCE SURGERY     LOOP RECORDER REMOVAL N/A 08/10/2017   Procedure: LOOP RECORDER REMOVAL;  Surgeon: Regan Lemming, MD;  Location: [facility];  Service: Cardiovascular;  Laterality: N/A;   TONSILLECTOMY      Allergies  Allergen Reactions   Bee Venom Other (See Comments)    Unknown reaction Per facility    Cipro [Ciprofloxacin Hcl] Other (See Comments)    Unknown reaction  Per facility   Crestor [Rosuvastatin] Other (See Comments)    Myalgias    Iodine Other (See Comments)    Unknown reaction  Per facility   Lodine [Etodolac] Other (See Comments)    Unknown reaction Per facility   Naprosyn [Naproxen] Other (See Comments)    Unknown reaction Per facility   Sulfonamide Derivatives Other (See Comments)    Unknown reaction Per facility   Tositumomab    Epipen [Epinephrine] Palpitations   Macrobid [Nitrofurantoin Macrocrystal] Rash   Metrocream [Metronidazole] Rash   Xanax [Alprazolam] Rash    Allergies as of 11/04/2022       Reactions   Bee Venom Other (See Comments)   Unknown reaction Per facility    Cipro [ciprofloxacin Hcl] Other (See Comments)   Unknown reaction  Per facility   Crestor [rosuvastatin] Other (See Comments)   Myalgias    Iodine Other (See Comments)   Unknown reaction  Per facility   Lodine [etodolac] Other (See Comments)   Unknown reaction Per facility   Naprosyn [naproxen] Other (See Comments)   Unknown reaction Per facility   Sulfonamide Derivatives Other (See Comments)   Unknown reaction Per facility   Tositumomab    Epipen [epinephrine] Palpitations   Macrobid [nitrofurantoin Macrocrystal] Rash   Metrocream [metronidazole] Rash   Xanax [alprazolam] Rash        Medication List        Accurate as of Nov 04, 2022 10:26 AM. If you have any questions, ask your nurse or doctor.          acetaminophen 500 MG tablet Commonly known as: TYLENOL Take 1 tablet (500 mg total) by mouth every 8 (eight) hours as  needed for moderate pain.   atorvastatin 40 MG tablet Commonly known as: LIPITOR Take 1 tablet (40 mg total) by mouth daily.   Biotin 5000 MCG Caps Take 10,000 mcg by mouth in the morning.   Chloraseptic Total 10-17-08 MG Lozg Generic drug: DM-Benzocaine-Menthol Use as directed in the mouth or throat. Give 1 lozenge by mouth every 2 hours as needed for sore throat may use up to six times   clopidogrel 75 MG tablet Commonly known as:  PLAVIX Take 1 tablet (75 mg total) by mouth daily.   Culturelle Caps Take 500 mg by mouth 2 (two) times daily as needed.   docusate sodium 100 MG capsule Commonly known as: COLACE Take 100 mg by mouth 2 (two) times daily.   escitalopram 10 MG tablet Commonly known as: LEXAPRO Take 10 mg by mouth at bedtime.   felodipine 5 MG 24 hr tablet Commonly known as: PLENDIL Take 5 mg by mouth daily.   gabapentin 100 MG capsule Commonly known as: NEURONTIN Take 100 mg by mouth at bedtime as needed.   indapamide 2.5 MG tablet Commonly known as: LOZOL Take 2.5 mg by mouth daily.   levothyroxine 50 MCG tablet Commonly known as: SYNTHROID Take 50 mcg by mouth in the morning.   Magnesium 200 MG Tabs Take 1 tablet by mouth daily.   melatonin 5 MG Tabs Take 5 mg by mouth daily.   polyethylene glycol 17 g packet Commonly known as: MIRALAX / GLYCOLAX Take 17 g by mouth as needed.   traZODone 50 MG tablet Commonly known as: DESYREL Take 50 mg by mouth daily.        Review of Systems  Constitutional:  Negative for activity change and appetite change.  Respiratory:  Negative for cough, shortness of breath and wheezing.   Cardiovascular:  Negative for chest pain and leg swelling.  Gastrointestinal:  Positive for diarrhea. Negative for abdominal distention, abdominal pain, blood in stool, constipation, nausea and vomiting.  Psychiatric/Behavioral:  Positive for confusion and dysphoric mood. The patient is nervous/anxious.     Immunization  History  Administered Date(s) Administered   DTaP 09/30/1995   Moderna SARS-COV2 Booster Vaccination 03/25/2020, 11/25/2021   Moderna Sars-Covid-2 Vaccination 07/02/2019, 07/16/2019, 11/19/2020   Respiratory Syncytial Virus Vaccine,Recomb Aduvanted(Arexvy) 06/26/2022   Tdap 06/15/2015   Pertinent  Health Maintenance Due  Topic Date Due   INFLUENZA VACCINE  01/13/2023   DEXA SCAN  Completed      04/17/2022    9:00 PM 04/18/2022   10:00 AM 06/25/2022   10:22 AM 06/25/2022    1:53 PM 08/25/2022   12:58 PM  Fall Risk  Falls in the past year?   0 0 1  Was there an injury with Fall?   0 0 1  Fall Risk Category Calculator   0 0 2  Fall Risk Category (Retired)   Low Low   (RETIRED) Patient Fall Risk Level Moderate fall risk High fall risk High fall risk High fall risk   Patient at Risk for Falls Due to   History of fall(s) History of fall(s);Impaired balance/gait;Impaired mobility History of fall(s);Impaired balance/gait;Impaired mobility  Fall risk Follow up   Falls evaluation completed Falls evaluation completed;Education provided Falls evaluation completed;Education provided   Functional Status Survey:    Vitals:   11/04/22 1021  BP: 120/60  Pulse: (!) 44  Resp: 17  Temp: 98.6 F (37 C)  SpO2: 93%  Weight: 176 lb 6.4 oz (80 kg)  Height: 5\' 5"  (1.651 m)   Body mass index is 29.35 kg/m. Physical Exam Vitals reviewed.  Constitutional:      General: She is not in acute distress. HENT:     Head: Normocephalic.     Mouth/Throat:     Mouth: Mucous membranes are moist.  Eyes:     General:        Right eye: No discharge.        Left eye: No discharge.  Cardiovascular:     Rate  and Rhythm: Normal rate and regular rhythm.     Pulses: Normal pulses.     Heart sounds: Normal heart sounds.  Pulmonary:     Effort: Pulmonary effort is normal. No respiratory distress.     Breath sounds: Normal breath sounds. No wheezing or rales.  Abdominal:     General: Bowel sounds are normal.  There is no distension.     Palpations: Abdomen is soft.     Tenderness: There is no abdominal tenderness. There is no guarding.  Musculoskeletal:     Cervical back: Neck supple.     Right lower leg: No edema.     Left lower leg: No edema.  Skin:    General: Skin is warm.     Capillary Refill: Capillary refill takes less than 2 seconds.  Neurological:     General: No focal deficit present.     Mental Status: She is alert.     Motor: Weakness present.     Gait: Gait abnormal.  Psychiatric:        Mood and Affect: Mood normal.     Labs reviewed: Recent Labs    04/16/22 1807 04/17/22 0849  NA 139 138  K 4.2 4.6  CL 100 101  CO2 30 25  GLUCOSE 98 175*  BUN 17 15  CREATININE 0.80 0.86  CALCIUM 9.5 8.9  MG  --  1.8   Recent Labs    04/16/22 1807  AST 22  ALT 18  ALKPHOS 46  BILITOT 0.7  PROT 6.9  ALBUMIN 3.8   Recent Labs    04/16/22 1807 04/17/22 0849  WBC 8.5 5.7  NEUTROABS 5.1  --   HGB 15.0 14.9  HCT 46.4* 44.7  MCV 102.4* 99.6  PLT 222 207   Lab Results  Component Value Date   TSH 1.304 04/17/2022   Lab Results  Component Value Date   HGBA1C 6.4 (H) 04/17/2022   Lab Results  Component Value Date   CHOL 181 04/17/2022   HDL 47 04/17/2022   LDLCALC 115 (H) 04/17/2022   LDLDIRECT 135.1 08/26/2010   TRIG 94 04/17/2022   CHOLHDL 3.9 04/17/2022    Significant Diagnostic Results in last 30 days:  No results found.  Assessment/Plan 1. Loose stools - onset x 2 days - abdomen soft, normal BS x 4, afebrile - will cut down use of colace and miralax - start colace once daily - start miralax every 3 days - discussed eating more fruits and vegetables to bulk up stool    Family/ staff Communication: plan discussed with patient and nurse  Labs/tests ordered:  none

## 2022-11-05 DIAGNOSIS — R41841 Cognitive communication deficit: Secondary | ICD-10-CM | POA: Diagnosis not present

## 2022-11-05 DIAGNOSIS — I69928 Other speech and language deficits following unspecified cerebrovascular disease: Secondary | ICD-10-CM | POA: Diagnosis not present

## 2022-11-05 DIAGNOSIS — F801 Expressive language disorder: Secondary | ICD-10-CM | POA: Diagnosis not present

## 2022-11-05 DIAGNOSIS — R278 Other lack of coordination: Secondary | ICD-10-CM | POA: Diagnosis not present

## 2022-11-08 DIAGNOSIS — F3341 Major depressive disorder, recurrent, in partial remission: Secondary | ICD-10-CM | POA: Diagnosis not present

## 2022-11-08 DIAGNOSIS — F411 Generalized anxiety disorder: Secondary | ICD-10-CM | POA: Diagnosis not present

## 2022-11-10 DIAGNOSIS — R41841 Cognitive communication deficit: Secondary | ICD-10-CM | POA: Diagnosis not present

## 2022-11-10 DIAGNOSIS — F801 Expressive language disorder: Secondary | ICD-10-CM | POA: Diagnosis not present

## 2022-11-10 DIAGNOSIS — R278 Other lack of coordination: Secondary | ICD-10-CM | POA: Diagnosis not present

## 2022-11-10 DIAGNOSIS — I69928 Other speech and language deficits following unspecified cerebrovascular disease: Secondary | ICD-10-CM | POA: Diagnosis not present

## 2022-11-12 DIAGNOSIS — I69928 Other speech and language deficits following unspecified cerebrovascular disease: Secondary | ICD-10-CM | POA: Diagnosis not present

## 2022-11-12 DIAGNOSIS — R278 Other lack of coordination: Secondary | ICD-10-CM | POA: Diagnosis not present

## 2022-11-12 DIAGNOSIS — F411 Generalized anxiety disorder: Secondary | ICD-10-CM | POA: Diagnosis not present

## 2022-11-12 DIAGNOSIS — R41841 Cognitive communication deficit: Secondary | ICD-10-CM | POA: Diagnosis not present

## 2022-11-12 DIAGNOSIS — F3341 Major depressive disorder, recurrent, in partial remission: Secondary | ICD-10-CM | POA: Diagnosis not present

## 2022-11-12 DIAGNOSIS — F801 Expressive language disorder: Secondary | ICD-10-CM | POA: Diagnosis not present

## 2022-11-17 DIAGNOSIS — I69928 Other speech and language deficits following unspecified cerebrovascular disease: Secondary | ICD-10-CM | POA: Diagnosis not present

## 2022-11-17 DIAGNOSIS — F801 Expressive language disorder: Secondary | ICD-10-CM | POA: Diagnosis not present

## 2022-11-17 DIAGNOSIS — R41841 Cognitive communication deficit: Secondary | ICD-10-CM | POA: Diagnosis not present

## 2022-11-17 DIAGNOSIS — R278 Other lack of coordination: Secondary | ICD-10-CM | POA: Diagnosis not present

## 2022-11-18 ENCOUNTER — Non-Acute Institutional Stay: Payer: Medicare PPO | Admitting: Internal Medicine

## 2022-11-18 DIAGNOSIS — I10 Essential (primary) hypertension: Secondary | ICD-10-CM

## 2022-11-18 DIAGNOSIS — E785 Hyperlipidemia, unspecified: Secondary | ICD-10-CM | POA: Diagnosis not present

## 2022-11-18 DIAGNOSIS — R001 Bradycardia, unspecified: Secondary | ICD-10-CM | POA: Diagnosis not present

## 2022-11-18 DIAGNOSIS — R197 Diarrhea, unspecified: Secondary | ICD-10-CM | POA: Diagnosis not present

## 2022-11-18 NOTE — Progress Notes (Signed)
[facility] Service:  ALF (13)  Provider:   Code Status: DNR Goals of Care:     11/04/2022   10:25 AM  Advanced Directives  Does Patient Have a Medical Advance Directive? Yes  Type of Estate agent of [place];Living will;Out of facility DNR (pink MOST or yellow form)  Does patient want to make changes to medical advance directive? No - Patient declined  Copy of Healthcare Power of Attorney in Chart? Yes - validated most recent copy scanned in chart (See row information)     Chief Complaint  Patient presents with   Acute Visit    HPI: Patient is a 89 y.o. female seen today for an acute visit for Diarrhea ?loose stools  Lives in [facility] in [facility]  Patient has a history of hypertension, hyperlipidemia, sleep apnea, hypothyroidism   Per nurses patient had complaint of constipation few weeks ago    Initially colace  was added and then MiraLAX was added.  But then patient started having diarrhea.   Colace and MiraLAX still is in her med list but nurses they are not giving it to her.   But patient keeps complaining that she is having loose stool/diarrhea many times a day. She says sometimes she has cramping in her lower abdomen.  She is also getting up at night occasionally to go to the bathroom. No blood or mucus in stool.  The nurses have looked at the stools and they are not watery.   Patient states she also feels weak due to going to the bathroom multiple times.   She is eating good her weight is stable she does not have any fever or chills no nausea or vomiting  Past Medical History:  Diagnosis Date   Allergy    Aortic stenosis    a. mild to mod by Echo 07/2012   Arthritis    Asymptomatic varicose veins    Benign paroxysmal vertigo    Bradycardia, unspecified    Bronchitis, not specified as acute or chronic    Cardiac murmur    Cataracts, bilateral    Chest pain, unspecified    Chronic cystitis    Depression    Diarrhea     Dyspnea    Dysuria    Frequency of micturition    Frequent urination    Gait disturbance 06/10/2014   Gastric ulcer    Hearing loss    Hemorrhoids    History of bone density study    History of colonoscopy    History of frequent urinary tract infections    History of urinary tract infection    Hx of cardiovascular stress test    a. ETT-Echo 3/12:  EF 60%, normal study   Hx of echocardiogram    a. Echo 2/14:  Mild LVH, EF 60-65%, Gr 1 diast dysfn, mild to mod AS, mean 17 mmHg, AVA 1.3 (VTI), trivial MR, mild LAE, PASP 31    Hyperlipemia    Hypertension    Hypothyroid    Left knee pain    Leg pain    Low back pain    Obstructive sleep apnea    OSA on CPAP    Other disorders of intestinal carbohydrate absorption    Other type of osteoarthritis, unspecified site    Overweight    Pain in thoracic spine    Palpitations    a. event monitor 3/14:  NSR, sinus brady   Plantar fasciitis    Plantar  fasciitis    Right shoulder pain    S/P cholecystectomy    S/P TAH-BSO    Sleep apnea    Stroke [facility])    tia 2016   TIA (transient ischemic attack)    Varicose veins of unspecified lower extremity with inflammation     Past Surgical History:  Procedure Laterality Date   ABDOMINAL HYSTERECTOMY     CHOLECYSTECTOMY     EP IMPLANTABLE DEVICE N/A 06/29/2016   Procedure: Loop Recorder Insertion;  Surgeon: Will Jorja Loa, MD;  [facility];  Service: Cardiovascular;  Laterality: N/A;   FOOT SURGERY  1955   INCONTINENCE SURGERY     LOOP RECORDER REMOVAL N/A 08/10/2017   Procedure: LOOP RECORDER REMOVAL;  Surgeon: Regan Lemming, MD;  [facility]: [facility];  Service: Cardiovascular;  Laterality: N/A;   TONSILLECTOMY      Allergies  Allergen Reactions   Bee Venom Other (See Comments)    Unknown reaction Per facility    Cipro [Ciprofloxacin Hcl] Other (See Comments)    Unknown reaction  Per facility   Crestor [Rosuvastatin] Other (See Comments)     Myalgias    Iodine Other (See Comments)    Unknown reaction  Per facility   Lodine [Etodolac] Other (See Comments)    Unknown reaction Per facility   Naprosyn [Naproxen] Other (See Comments)    Unknown reaction Per facility   Sulfonamide Derivatives Other (See Comments)    Unknown reaction Per facility   Tositumomab    Epipen [Epinephrine] Palpitations   Macrobid [Nitrofurantoin Macrocrystal] Rash   Metrocream [Metronidazole] Rash   Xanax [Alprazolam] Rash    Outpatient Encounter Medications as of 11/18/2022  Medication Sig   acetaminophen (TYLENOL) 500 MG tablet Take 1 tablet (500 mg total) by mouth every 8 (eight) hours as needed for moderate pain.   atorvastatin (LIPITOR) 40 MG tablet Take 1 tablet (40 mg total) by mouth daily.   Biotin 5000 MCG CAPS Take 10,000 mcg by mouth in the morning.   clopidogrel (PLAVIX) 75 MG tablet Take 1 tablet (75 mg total) by mouth daily.   DM-Benzocaine-Menthol (CHLORASEPTIC TOTAL) 10-17-08 MG LOZG Use as directed in the mouth or throat. Give 1 lozenge by mouth every 2 hours as needed for sore throat may use up to six times   docusate sodium (COLACE) 100 MG capsule Take 100 mg by mouth daily.   escitalopram (LEXAPRO) 10 MG tablet Take 10 mg by mouth at bedtime.   felodipine (PLENDIL) 5 MG 24 hr tablet Take 5 mg by mouth daily.   gabapentin (NEURONTIN) 100 MG capsule Take 100 mg by mouth at bedtime as needed.   indapamide (LOZOL) 2.5 MG tablet Take 2.5 mg by mouth daily.   Lactobacillus Rhamnosus, GG, (CULTURELLE) CAPS Take 500 mg by mouth 2 (two) times daily as needed.   levothyroxine (SYNTHROID, LEVOTHROID) 50 MCG tablet Take 50 mcg by mouth in the morning.   Magnesium 200 MG TABS Take 1 tablet by mouth daily.   melatonin 5 MG TABS Take 5 mg by mouth daily.   polyethylene glycol (MIRALAX / GLYCOLAX) 17 g packet Take 17 g by mouth every 3 (three) days.   traZODone (DESYREL) 50 MG tablet Take 50 mg by mouth daily.   No facility-administered  encounter medications on file as of 11/18/2022.    Review of Systems:  Review of Systems  Constitutional:  Negative for activity change and appetite change.  HENT: Negative.    Respiratory:  Negative for cough and shortness of breath.   Cardiovascular:  Negative for leg swelling.  Gastrointestinal:  Positive for abdominal distention and diarrhea. Negative for constipation.  Genitourinary: Negative.   Musculoskeletal:  Negative for arthralgias, gait problem and myalgias.  Skin: Negative.   Neurological:  Negative for dizziness and weakness.  Psychiatric/Behavioral:  Positive for confusion. Negative for dysphoric mood and sleep disturbance.     Health Maintenance  Topic Date Due   Zoster Vaccines- Shingrix (1 of 2) Never done   Pneumonia Vaccine 56+ Years old (1 of 1 - PCV) Never done   INFLUENZA VACCINE  01/13/2023   Medicare Annual Wellness (AWV)  06/26/2023   DTaP/Tdap/Td (3 - Td or Tdap) 06/14/2025   DEXA SCAN  Completed   HPV VACCINES  Aged Out   COVID-19 Vaccine  Discontinued    Physical Exam: Vitals:   11/19/22 0919  BP: 136/69  Pulse: (!) 55  Resp: 20  Temp: 97.9 F (36.6 C)  Weight: 172 lb 6.4 oz (78.2 kg)   Body mass index is 28.69 kg/m. Physical Exam Vitals reviewed.  Constitutional:      Appearance: Normal appearance.  HENT:     Head: Normocephalic.     Nose: Nose normal.     Mouth/Throat:     Mouth: Mucous membranes are moist.     Pharynx: Oropharynx is clear.  Eyes:     Pupils: Pupils are equal, round, and reactive to light.  Cardiovascular:     Rate and Rhythm: Normal rate and regular rhythm.     Pulses: Normal pulses.     Heart sounds: Normal heart sounds. No murmur heard. Pulmonary:     Effort: Pulmonary effort is normal.     Breath sounds: Normal breath sounds.  Abdominal:     General: Abdomen is flat. Bowel sounds are normal. There is no distension.     Palpations: Abdomen is soft.     Tenderness: There is no abdominal tenderness. There is  no guarding.  Musculoskeletal:        General: No swelling.     Cervical back: Neck supple.  Skin:    General: Skin is warm.  Neurological:     General: No focal deficit present.     Mental Status: She is alert and oriented to person, place, and time.  Psychiatric:        Mood and Affect: Mood normal.        Thought Content: Thought content normal.     Labs reviewed: Basic Metabolic Panel: Recent Labs    04/16/22 1807 04/17/22 0849  NA 139 138  K 4.2 4.6  CL 100 101  CO2 30 25  GLUCOSE 98 175*  BUN 17 15  CREATININE 0.80 0.86  CALCIUM 9.5 8.9  MG  --  1.8  TSH  --  1.304   Liver Function Tests: Recent Labs    04/16/22 1807  AST 22  ALT 18  ALKPHOS 46  BILITOT 0.7  PROT 6.9  ALBUMIN 3.8   No results for input(s): "LIPASE", "AMYLASE" in the last 8760 hours. No results for input(s): "AMMONIA" in the last 8760 hours. CBC: Recent Labs    04/16/22 1807 04/17/22 0849  WBC 8.5 5.7  NEUTROABS 5.1  --   HGB 15.0 14.9  HCT 46.4* 44.7  MCV 102.4* 99.6  PLT 222 207   Lipid Panel: Recent Labs    04/17/22 0500  CHOL 181  HDL 47  LDLCALC 115*  TRIG 94  CHOLHDL 3.9   Lab Results  Component Value Date   HGBA1C 6.4 (H) 04/17/2022    Procedures since last visit: No results found.  Assessment/Plan 1. Diarrhea/ Loose Stools KUB 2 view to rule out Constipation  Send stool for Lactoferrin,C Diff,  CMP,CBC,Lipid,TSH tomorrow  Discontinued Miralax ,Colace and Magnesium  Trazodone can also cause diarrhea and her dose was increased few weeks ago If Work up negative will consider reducing the dose She does not want imodium right now 2 Bradycardia Patient has had work up before and has been negative Her HR sometimes drop to 44-45 I have told nurses to let me know if she develops any symptoms  Total time spent in this patient care encounter was  45_  minutes; greater than 50% of the visit spent counseling patient and staff, reviewing records , Labs and  coordinating care for problems addressed at this encounter.    Labs/tests ordered:  * No order type specified * Next appt:  Visit date not found

## 2022-11-19 ENCOUNTER — Encounter: Payer: Self-pay | Admitting: Internal Medicine

## 2022-11-19 DIAGNOSIS — R278 Other lack of coordination: Secondary | ICD-10-CM | POA: Diagnosis not present

## 2022-11-19 DIAGNOSIS — R197 Diarrhea, unspecified: Secondary | ICD-10-CM | POA: Diagnosis not present

## 2022-11-19 DIAGNOSIS — R41841 Cognitive communication deficit: Secondary | ICD-10-CM | POA: Diagnosis not present

## 2022-11-19 DIAGNOSIS — I10 Essential (primary) hypertension: Secondary | ICD-10-CM | POA: Diagnosis not present

## 2022-11-19 DIAGNOSIS — I69928 Other speech and language deficits following unspecified cerebrovascular disease: Secondary | ICD-10-CM | POA: Diagnosis not present

## 2022-11-19 DIAGNOSIS — E785 Hyperlipidemia, unspecified: Secondary | ICD-10-CM | POA: Diagnosis not present

## 2022-11-19 DIAGNOSIS — Z0189 Encounter for other specified special examinations: Secondary | ICD-10-CM | POA: Diagnosis not present

## 2022-11-19 DIAGNOSIS — F801 Expressive language disorder: Secondary | ICD-10-CM | POA: Diagnosis not present

## 2022-11-20 LAB — COMPREHENSIVE METABOLIC PANEL
Albumin: 3.4 — AB (ref 3.5–5.0)
Calcium: 8.4 — AB (ref 8.7–10.7)
Globulin: 2

## 2022-11-20 LAB — BASIC METABOLIC PANEL
BUN: 14 (ref 4–21)
CO2: 28 — AB (ref 13–22)
Chloride: 103 (ref 99–108)
Creatinine: 0.8 (ref 0.5–1.1)
Glucose: 94
Potassium: 3.5 meq/L (ref 3.5–5.1)
Sodium: 142 (ref 137–147)

## 2022-11-20 LAB — LIPID PANEL
Cholesterol: 105 (ref 0–200)
HDL: 39 (ref 35–70)
LDL Cholesterol: 47
Triglycerides: 100 (ref 40–160)

## 2022-11-20 LAB — HEPATIC FUNCTION PANEL
ALT: 7 U/L (ref 7–35)
AST: 13 (ref 13–35)
Alkaline Phosphatase: 52 (ref 25–125)
Bilirubin, Total: 0.5

## 2022-11-20 LAB — CBC AND DIFFERENTIAL
HCT: 40 (ref 36–46)
Hemoglobin: 13.5 (ref 12.0–16.0)
Neutrophils Absolute: 3703
Platelets: 215 10*3/uL (ref 150–400)
WBC: 6.6

## 2022-11-20 LAB — CBC: RBC: 4.18 (ref 3.87–5.11)

## 2022-11-22 ENCOUNTER — Non-Acute Institutional Stay: Payer: Medicare PPO | Admitting: Orthopedic Surgery

## 2022-11-22 ENCOUNTER — Encounter: Payer: Self-pay | Admitting: Orthopedic Surgery

## 2022-11-22 DIAGNOSIS — E119 Type 2 diabetes mellitus without complications: Secondary | ICD-10-CM | POA: Diagnosis not present

## 2022-11-22 DIAGNOSIS — K5901 Slow transit constipation: Secondary | ICD-10-CM | POA: Diagnosis not present

## 2022-11-22 DIAGNOSIS — F411 Generalized anxiety disorder: Secondary | ICD-10-CM | POA: Diagnosis not present

## 2022-11-22 DIAGNOSIS — F3341 Major depressive disorder, recurrent, in partial remission: Secondary | ICD-10-CM | POA: Diagnosis not present

## 2022-11-22 DIAGNOSIS — E039 Hypothyroidism, unspecified: Secondary | ICD-10-CM | POA: Diagnosis not present

## 2022-11-22 NOTE — Progress Notes (Signed)
Location:  [facility] Room Number: 10/A Place of Service:  ALF [id number]) Provider:  Octavia Heir, NP   Mahlon Gammon, MD  Patient Care Team: Mahlon Gammon, MD as PCP - General (Internal Medicine) Regan Lemming, MD as PCP - Cardiology (Cardiology) Antony Contras, MD as Consulting Physician (Ophthalmology) Edwinna Areola, DO as Consulting Physician (Obstetrics and Gynecology) Dohmeier, Porfirio Mylar, MD as Consulting Physician (Neurology) Serena Colonel, MD as Consulting Physician (Otolaryngology)  Extended Emergency Contact Information Primary Emergency Contact: [facility] Address: [address]          [place], [place] [address] [place] of [place] Home Phone: [phone] Mobile Phone: [phone] Relation: Daughter Secondary Emergency Contact: Kerry Fort Address: [place] Home Phone: [phone] Relation: Brother  Code Status:  DNR Goals of care: Advanced Directive information    11/04/2022   10:25 AM  Advanced Directives  Does Patient Have a Medical Advance Directive? Yes  Type of Estate agent of [place];Living will;Out of facility DNR (pink MOST or yellow form)  Does patient want to make changes to medical advance directive? No - Patient declined  Copy of Healthcare Power of Attorney in Chart? Yes - validated most recent copy scanned in chart (See row information)     Chief Complaint  Patient presents with   Acute Visit    constipation    HPI:  Pt is a 89 y.o. female seen today due to ongoing intermittent diarrhea.   Loose stools and diarrhea x 2 weeks. 06/06 evaluated by Dr. Chales Abrahams KUB, stool studies and lab work ordered. KUB reveals moderate stool in colon compatible with constipation. Cbc/diff, cmp, lipid panel unremarkable. Stool studies pending. She admits to intermittent lower abdominal, lower back pain. Denies nausea and vomiting. Staff has been holding  colace and miralax due to diarrhea. Appetite fair. Afebrile. Vitals stable.    Past Medical History:  Diagnosis Date   Allergy    Aortic stenosis    a. mild to mod by Echo 07/2012   Arthritis    Asymptomatic varicose veins    Benign paroxysmal vertigo    Bradycardia, unspecified    Bronchitis, not specified as acute or chronic    Cardiac murmur    Cataracts, bilateral    Chest pain, unspecified    Chronic cystitis    Depression    Diarrhea    Dyspnea    Dysuria    Frequency of micturition    Frequent urination    Gait disturbance 06/10/2014   Gastric ulcer    Hearing loss    Hemorrhoids    History of bone density study    History of colonoscopy    History of frequent urinary tract infections    History of urinary tract infection    Hx of cardiovascular stress test    a. ETT-Echo 3/12:  EF 60%, normal study   Hx of echocardiogram    a. Echo 2/14:  Mild LVH, EF 60-65%, Gr 1 diast dysfn, mild to mod AS, mean 17 mmHg, AVA 1.3 (VTI), trivial MR, mild LAE, PASP 31    Hyperlipemia    Hypertension    Hypothyroid    Left knee pain    Leg pain    Low back pain    Obstructive sleep apnea    OSA on CPAP    Other disorders of intestinal carbohydrate absorption    Other type of osteoarthritis, unspecified site    Overweight  Pain in thoracic spine    Palpitations    a. event monitor 3/14:  NSR, sinus brady   Plantar fasciitis    Plantar fasciitis    Right shoulder pain    S/P cholecystectomy    S/P TAH-BSO    Sleep apnea    Stroke (HCC)    tia 2016   TIA (transient ischemic attack)    Varicose veins of unspecified lower extremity with inflammation    Past Surgical History:  Procedure Laterality Date   ABDOMINAL HYSTERECTOMY     CHOLECYSTECTOMY     EP IMPLANTABLE DEVICE N/A 06/29/2016   Procedure: Loop Recorder Insertion;  Surgeon: Will Jorja Loa, MD;  Location: [facility];  Service: Cardiovascular;  Laterality: N/A;   FOOT SURGERY  1955   INCONTINENCE  SURGERY     LOOP RECORDER REMOVAL N/A 08/10/2017   Procedure: LOOP RECORDER REMOVAL;  Surgeon: Regan Lemming, MD;  Location: [facility];  Service: Cardiovascular;  Laterality: N/A;   TONSILLECTOMY      Allergies  Allergen Reactions   Bee Venom Other (See Comments)    Unknown reaction Per facility    Cipro [Ciprofloxacin Hcl] Other (See Comments)    Unknown reaction  Per facility   Crestor [Rosuvastatin] Other (See Comments)    Myalgias    Iodine Other (See Comments)    Unknown reaction  Per facility   Lodine [Etodolac] Other (See Comments)    Unknown reaction Per facility   Naprosyn [Naproxen] Other (See Comments)    Unknown reaction Per facility   Sulfonamide Derivatives Other (See Comments)    Unknown reaction Per facility   Tositumomab    Epipen [Epinephrine] Palpitations   Macrobid [Nitrofurantoin Macrocrystal] Rash   Metrocream [Metronidazole] Rash   Xanax [Alprazolam] Rash    Outpatient Encounter Medications as of 11/22/2022  Medication Sig   acetaminophen (TYLENOL) 500 MG tablet Take 1 tablet (500 mg total) by mouth every 8 (eight) hours as needed for moderate pain.   atorvastatin (LIPITOR) 40 MG tablet Take 1 tablet (40 mg total) by mouth daily.   Biotin 5000 MCG CAPS Take 10,000 mcg by mouth in the morning.   clopidogrel (PLAVIX) 75 MG tablet Take 1 tablet (75 mg total) by mouth daily.   DM-Benzocaine-Menthol (CHLORASEPTIC TOTAL) 10-17-08 MG LOZG Use as directed in the mouth or throat. Give 1 lozenge by mouth every 2 hours as needed for sore throat may use up to six times   docusate sodium (COLACE) 100 MG capsule Take 100 mg by mouth daily.   escitalopram (LEXAPRO) 10 MG tablet Take 10 mg by mouth at bedtime.   felodipine (PLENDIL) 5 MG 24 hr tablet Take 5 mg by mouth daily.   gabapentin (NEURONTIN) 100 MG capsule Take 100 mg by mouth at bedtime as needed.   indapamide (LOZOL) 2.5 MG tablet Take 2.5 mg by mouth daily.   Lactobacillus Rhamnosus, GG,  (CULTURELLE) CAPS Take 500 mg by mouth 2 (two) times daily as needed.   levothyroxine (SYNTHROID, LEVOTHROID) 50 MCG tablet Take 50 mcg by mouth in the morning.   Magnesium 200 MG TABS Take 1 tablet by mouth daily.   melatonin 5 MG TABS Take 5 mg by mouth daily.   polyethylene glycol (MIRALAX / GLYCOLAX) 17 g packet Take 17 g by mouth every 3 (three) days.   traZODone (DESYREL) 50 MG tablet Take 50 mg by mouth daily.   No facility-administered encounter medications on file as of 11/22/2022.  Review of Systems  Constitutional:  Negative for activity change and appetite change.  HENT:  Negative for sore throat.   Respiratory:  Negative for cough, shortness of breath and wheezing.   Cardiovascular:  Negative for chest pain and leg swelling.  Gastrointestinal:  Positive for abdominal distention, abdominal pain, constipation and diarrhea. Negative for nausea and vomiting.  Genitourinary:  Negative for dysuria.  Musculoskeletal:  Positive for back pain and gait problem.  Skin:  Negative for wound.  Neurological:  Negative for dizziness and headaches.  Psychiatric/Behavioral:  Positive for confusion and dysphoric mood. Negative for sleep disturbance. The patient is not nervous/anxious.     Immunization History  Administered Date(s) Administered   DTaP 09/30/1995   Moderna SARS-COV2 Booster Vaccination 03/25/2020, 11/25/2021   Moderna Sars-Covid-2 Vaccination 07/02/2019, 07/16/2019, 11/19/2020   Respiratory Syncytial Virus Vaccine,Recomb Aduvanted(Arexvy) 06/26/2022   Tdap 06/15/2015   Pertinent  Health Maintenance Due  Topic Date Due   INFLUENZA VACCINE  01/13/2023   DEXA SCAN  Completed      04/17/2022    9:00 PM 04/18/2022   10:00 AM 06/25/2022   10:22 AM 06/25/2022    1:53 PM 08/25/2022   12:58 PM  Fall Risk  Falls in the past year?   0 0 1  Was there an injury with Fall?   0 0 1  Fall Risk Category Calculator   0 0 2  Fall Risk Category (Retired)   Low Low   (RETIRED) Patient  Fall Risk Level Moderate fall risk High fall risk High fall risk High fall risk   Patient at Risk for Falls Due to   History of fall(s) History of fall(s);Impaired balance/gait;Impaired mobility History of fall(s);Impaired balance/gait;Impaired mobility  Fall risk Follow up   Falls evaluation completed Falls evaluation completed;Education provided Falls evaluation completed;Education provided   Functional Status Survey:    Vitals:   11/22/22 0925  BP: 136/69  Pulse: (!) 55  Resp: 20  Temp: 97.9 F (36.6 C)  SpO2: 91%  Weight: 172 lb 6.4 oz (78.2 kg)  Height: 5\' 5"  (1.651 m)   Body mass index is 28.69 kg/m. Physical Exam Vitals reviewed.  Constitutional:      General: She is not in acute distress. HENT:     Head: Normocephalic.  Eyes:     General:        Right eye: No discharge.        Left eye: No discharge.  Cardiovascular:     Rate and Rhythm: Normal rate and regular rhythm.     Pulses: Normal pulses.     Heart sounds: Normal heart sounds.  Pulmonary:     Effort: Pulmonary effort is normal. No respiratory distress.     Breath sounds: Normal breath sounds. No wheezing.  Abdominal:     General: There is distension.     Palpations: There is no mass.     Tenderness: There is no abdominal tenderness. There is no guarding or rebound.     Hernia: No hernia is present.     Comments: Hypoactive bowel sounds x 4   Musculoskeletal:     Cervical back: Neck supple.     Right lower leg: No edema.     Left lower leg: No edema.  Skin:    General: Skin is warm.     Capillary Refill: Capillary refill takes less than 2 seconds.  Neurological:     General: No focal deficit present.     Mental Status: She is alert.  Motor: Weakness present.     Gait: Gait abnormal.     Comments: walker  Psychiatric:        Mood and Affect: Mood normal.     Labs reviewed: Recent Labs    04/16/22 1807 04/17/22 0849  NA 139 138  K 4.2 4.6  CL 100 101  CO2 30 25  GLUCOSE 98 175*   BUN 17 15  CREATININE 0.80 0.86  CALCIUM 9.5 8.9  MG  --  1.8   Recent Labs    04/16/22 1807  AST 22  ALT 18  ALKPHOS 46  BILITOT 0.7  PROT 6.9  ALBUMIN 3.8   Recent Labs    04/16/22 1807 04/17/22 0849  WBC 8.5 5.7  NEUTROABS 5.1  --   HGB 15.0 14.9  HCT 46.4* 44.7  MCV 102.4* 99.6  PLT 222 207   Lab Results  Component Value Date   TSH 1.304 04/17/2022   Lab Results  Component Value Date   HGBA1C 6.4 (H) 04/17/2022   Lab Results  Component Value Date   CHOL 181 04/17/2022   HDL 47 04/17/2022   LDLCALC 115 (H) 04/17/2022   LDLDIRECT 135.1 08/26/2010   TRIG 94 04/17/2022   CHOLHDL 3.9 04/17/2022    Significant Diagnostic Results in last 30 days:  No results found.  Assessment/Plan 1. Constipation due to slow transit - intermittent diarrhea/ loose stools x 2 weeks - recent KUB reveals moderate stool in colon consistent with constipation - stool studies pending - cbc/diff, cmp unremarkable - abdomen distended with hypoactive bowel sounds x 4 - check for impaction> give fleet enema x 1 for impaction - miralax po BID 06/10, if no BM repeat 06/11 - restart colace and miralax daily    Family/ staff Communication: plan discussed with patient and nurse  Labs/tests ordered:  stool studies pending

## 2022-11-23 DIAGNOSIS — R278 Other lack of coordination: Secondary | ICD-10-CM | POA: Diagnosis not present

## 2022-11-23 DIAGNOSIS — R41841 Cognitive communication deficit: Secondary | ICD-10-CM | POA: Diagnosis not present

## 2022-11-23 DIAGNOSIS — F801 Expressive language disorder: Secondary | ICD-10-CM | POA: Diagnosis not present

## 2022-11-23 DIAGNOSIS — I69928 Other speech and language deficits following unspecified cerebrovascular disease: Secondary | ICD-10-CM | POA: Diagnosis not present

## 2022-11-23 LAB — HEMOGLOBIN A1C: Hemoglobin A1C: 6.7

## 2022-11-23 LAB — TSH: TSH: 0.8 (ref 0.41–5.90)

## 2022-11-24 ENCOUNTER — Non-Acute Institutional Stay: Payer: Medicare PPO | Admitting: Orthopedic Surgery

## 2022-11-24 ENCOUNTER — Encounter: Payer: Self-pay | Admitting: Orthopedic Surgery

## 2022-11-24 ENCOUNTER — Other Ambulatory Visit: Payer: Self-pay | Admitting: Orthopedic Surgery

## 2022-11-24 DIAGNOSIS — K5901 Slow transit constipation: Secondary | ICD-10-CM

## 2022-11-24 DIAGNOSIS — G4733 Obstructive sleep apnea (adult) (pediatric): Secondary | ICD-10-CM

## 2022-11-24 DIAGNOSIS — Z79899 Other long term (current) drug therapy: Secondary | ICD-10-CM

## 2022-11-24 DIAGNOSIS — F801 Expressive language disorder: Secondary | ICD-10-CM | POA: Diagnosis not present

## 2022-11-24 DIAGNOSIS — F339 Major depressive disorder, recurrent, unspecified: Secondary | ICD-10-CM | POA: Diagnosis not present

## 2022-11-24 DIAGNOSIS — I69928 Other speech and language deficits following unspecified cerebrovascular disease: Secondary | ICD-10-CM | POA: Diagnosis not present

## 2022-11-24 DIAGNOSIS — R41841 Cognitive communication deficit: Secondary | ICD-10-CM | POA: Diagnosis not present

## 2022-11-24 DIAGNOSIS — R278 Other lack of coordination: Secondary | ICD-10-CM | POA: Diagnosis not present

## 2022-11-24 NOTE — Progress Notes (Signed)
Location:  [facility] Room Number: 10/A Place of Service:  ALF [phone]) Provider:  Octavia Heir, NP   Mahlon Gammon, MD  Patient Care Team: Mahlon Gammon, MD as PCP - General (Internal Medicine) Regan Lemming, MD as PCP - Cardiology (Cardiology) Antony Contras, MD as Consulting Physician (Ophthalmology) Edwinna Areola, DO as Consulting Physician (Obstetrics and Gynecology) Dohmeier, Porfirio Mylar, MD as Consulting Physician (Neurology) Serena Colonel, MD as Consulting Physician (Otolaryngology)  Extended Emergency Contact Information Primary Emergency Contact: [facility] Address: [address]          [place], [place] [address] [place] Home Phone: [phone] Mobile Phone: [phone] Relation: Daughter Secondary Emergency Contact: Kerry Fort Address: [place] Home Phone: [phone] Relation: Brother  Code Status:  DNR Goals of care: Advanced Directive information    11/04/2022   10:25 AM  Advanced Directives  Does Patient Have a Medical Advance Directive? Yes  Type of Estate agent of [place];Living will;Out of facility DNR (pink MOST or yellow form)  Does patient want to make changes to medical advance directive? No - Patient declined  Copy of Healthcare Power of Attorney in Chart? Yes - validated most recent copy scanned in chart (See row information)     Chief Complaint  Patient presents with   Acute Visit    Depression     HPI:  Pt is a 89 y.o. female seen today for acute visit due to ongoing depression.   She currently resides on the assisted living unit at [facility]. PMH: HTN, HLD, loop recorder removal 2019,  sleep apnea, hypothyroidism, ischemic stroke to Left MCA 04/2022, aortic stenosis, unstable gait, insomnia and depression.   Daughter present during encounter.   She reports increased sadness, lack of motivation. At times she is  sleeping more. Daughter concerned she has some regret at this stage in her life. PHQ score 5 today. She is planning on starting talk therapy soon. Daughter also has friends come visit her 2x/weekly. Plan to increase Lexapro. She is also on Trazodone. No plans to harm self. No recent panic attacks.    Recent KUB revealed moderate stool in colon. Cdiff negative. Lactoferrin positive. 06/10 given miralax BID. She has had 3 bowel movements so far. She is now taking colace and miralax daily.   Medications reviewed with daughter. Will discontinue biotin and chloraseptic lozenges.   Daughter asking for improved CPAP compliance. She does not always use. No excessive daytime sleepiness. H/o cognitive impairment. Daughter believes she is more confused on days she does not use. Discussed quality of life versus cardiac SE from not using CPAP. Plan to continue PRN.    Past Surgical History:  Procedure Laterality Date   ABDOMINAL HYSTERECTOMY     CHOLECYSTECTOMY     EP IMPLANTABLE DEVICE N/A 06/29/2016   Procedure: Loop Recorder Insertion;  Surgeon: Will Jorja Loa, MD;  Location: [facility];  Service: Cardiovascular;  Laterality: N/A;   FOOT SURGERY  1955   INCONTINENCE SURGERY     LOOP RECORDER REMOVAL N/A 08/10/2017   Procedure: LOOP RECORDER REMOVAL;  Surgeon: Regan Lemming, MD;  Location: [facility];  Service: Cardiovascular;  Laterality: N/A;   TONSILLECTOMY      Allergies  Allergen Reactions   Bee Venom Other (See Comments)    Unknown reaction Per facility    Cipro [Ciprofloxacin Hcl] Other (See Comments)    Unknown  reaction  Per facility   Crestor [Rosuvastatin] Other (See Comments)    Myalgias    Iodine Other (See Comments)    Unknown reaction  Per facility   Lodine [Etodolac] Other (See Comments)    Unknown reaction Per facility   Naprosyn [Naproxen] Other (See Comments)    Unknown reaction Per facility   Sulfonamide Derivatives Other (See Comments)     Unknown reaction Per facility   Tositumomab    Epipen [Epinephrine] Palpitations   Macrobid [Nitrofurantoin Macrocrystal] Rash   Metrocream [Metronidazole] Rash   Xanax [Alprazolam] Rash    Outpatient Encounter Medications as of 11/24/2022  Medication Sig   acetaminophen (TYLENOL) 500 MG tablet Take 1 tablet (500 mg total) by mouth every 8 (eight) hours as needed for moderate pain.   atorvastatin (LIPITOR) 40 MG tablet Take 1 tablet (40 mg total) by mouth daily.   Biotin 5000 MCG CAPS Take 10,000 mcg by mouth in the morning.   clopidogrel (PLAVIX) 75 MG tablet Take 1 tablet (75 mg total) by mouth daily.   DM-Benzocaine-Menthol (CHLORASEPTIC TOTAL) 10-17-08 MG LOZG Use as directed in the mouth or throat. Give 1 lozenge by mouth every 2 hours as needed for sore throat may use up to six times   docusate sodium (COLACE) 100 MG capsule Take 100 mg by mouth daily.   escitalopram (LEXAPRO) 10 MG tablet Take 10 mg by mouth at bedtime.   felodipine (PLENDIL) 5 MG 24 hr tablet Take 5 mg by mouth daily.   gabapentin (NEURONTIN) 100 MG capsule Take 100 mg by mouth at bedtime as needed.   indapamide (LOZOL) 2.5 MG tablet Take 2.5 mg by mouth daily.   Lactobacillus Rhamnosus, GG, (CULTURELLE) CAPS Take 500 mg by mouth 2 (two) times daily as needed.   levothyroxine (SYNTHROID, LEVOTHROID) 50 MCG tablet Take 50 mcg by mouth in the morning.   Magnesium 200 MG TABS Take 1 tablet by mouth daily.   melatonin 5 MG TABS Take 5 mg by mouth daily.   polyethylene glycol (MIRALAX / GLYCOLAX) 17 g packet Take 17 g by mouth daily.   traZODone (DESYREL) 50 MG tablet Take 50 mg by mouth daily.   No facility-administered encounter medications on file as of 11/24/2022.    Review of Systems  Constitutional:  Negative for activity change and appetite change.  HENT:  Negative for trouble swallowing.   Eyes:  Negative for visual disturbance.  Respiratory:  Negative for cough, shortness of breath and wheezing.    Cardiovascular:  Negative for chest pain and leg swelling.  Gastrointestinal:  Positive for constipation. Negative for abdominal distention and abdominal pain.  Genitourinary:  Negative for dysuria.  Musculoskeletal:  Positive for gait problem.  Skin:  Negative for wound.  Neurological:  Positive for weakness. Negative for dizziness and headaches.  Psychiatric/Behavioral:  Positive for confusion, dysphoric mood and sleep disturbance. The patient is not nervous/anxious.     Immunization History  Administered Date(s) Administered   DTaP 09/30/1995   Moderna SARS-COV2 Booster Vaccination 03/25/2020, 11/25/2021   Moderna Sars-Covid-2 Vaccination 07/02/2019, 07/16/2019, 11/19/2020   Respiratory Syncytial Virus Vaccine,Recomb Aduvanted(Arexvy) 06/26/2022   Tdap 06/15/2015   Pertinent  Health Maintenance Due  Topic Date Due   INFLUENZA VACCINE  01/13/2023   DEXA SCAN  Completed      04/17/2022    9:00 PM 04/18/2022   10:00 AM 06/25/2022   10:22 AM 06/25/2022    1:53 PM 08/25/2022   12:58 PM  Fall Risk  Falls  in the past year?   0 0 1  Was there an injury with Fall?   0 0 1  Fall Risk Category Calculator   0 0 2  Fall Risk Category (Retired)   Low Low   (RETIRED) Patient Fall Risk Level Moderate fall risk High fall risk High fall risk High fall risk   Patient at Risk for Falls Due to   History of fall(s) History of fall(s);Impaired balance/gait;Impaired mobility History of fall(s);Impaired balance/gait;Impaired mobility  Fall risk Follow up   Falls evaluation completed Falls evaluation completed;Education provided Falls evaluation completed;Education provided   Functional Status Survey:    Vitals:   11/24/22 1452  BP: 136/69  Pulse: (!) 55  Resp: 20  Temp: 97.9 F (36.6 C)  SpO2: 91%  Weight: 172 lb 6.4 oz (78.2 kg)  Height: 5\' 5"  (1.651 m)   Body mass index is 28.69 kg/m. Physical Exam Vitals reviewed.  Constitutional:      General: She is not in acute distress. HENT:      Head: Normocephalic.  Eyes:     General:        Right eye: No discharge.        Left eye: No discharge.  Cardiovascular:     Rate and Rhythm: Normal rate and regular rhythm.     Pulses: Normal pulses.     Heart sounds: Normal heart sounds.  Pulmonary:     Effort: Pulmonary effort is normal. No respiratory distress.     Breath sounds: Normal breath sounds.  Abdominal:     General: Bowel sounds are normal. There is no distension.     Palpations: Abdomen is soft.     Tenderness: There is no abdominal tenderness.     Comments: Active bowel sounds x 4  Musculoskeletal:     Cervical back: Neck supple.     Right lower leg: No edema.     Left lower leg: No edema.  Skin:    General: Skin is warm.     Capillary Refill: Capillary refill takes less than 2 seconds.  Neurological:     General: No focal deficit present.     Mental Status: She is alert. Mental status is at baseline.     Motor: Weakness present.     Gait: Gait abnormal.     Comments: walker  Psychiatric:        Mood and Affect: Mood normal.     Labs reviewed: Recent Labs    04/16/22 1807 04/17/22 0849  NA 139 138  K 4.2 4.6  CL 100 101  CO2 30 25  GLUCOSE 98 175*  BUN 17 15  CREATININE 0.80 0.86  CALCIUM 9.5 8.9  MG  --  1.8   Recent Labs    04/16/22 1807  AST 22  ALT 18  ALKPHOS 46  BILITOT 0.7  PROT 6.9  ALBUMIN 3.8   Recent Labs    04/16/22 1807 04/17/22 0849  WBC 8.5 5.7  NEUTROABS 5.1  --   HGB 15.0 14.9  HCT 46.4* 44.7  MCV 102.4* 99.6  PLT 222 207   Lab Results  Component Value Date   TSH 1.304 04/17/2022   Lab Results  Component Value Date   HGBA1C 6.4 (H) 04/17/2022   Lab Results  Component Value Date   CHOL 181 04/17/2022   HDL 47 04/17/2022   LDLCALC 115 (H) 04/17/2022   LDLDIRECT 135.1 08/26/2010   TRIG 94 04/17/2022   CHOLHDL 3.9 04/17/2022  Significant Diagnostic Results in last 30 days:  No results found.  Assessment/Plan 1. Recurrent depression  (HCC) - PHQ score 5 - lack of motivation, expressing regret to family, sleeping more - weight stable - increase Lexapro to 15 mg po daily - cont talk therapy  - bmp in 2 weeks to check sodium level  2. Constipation due to slow transit - improved - KUB revealed moderate stool in colon - c diff negative - lactoferrin positive - given miralax BID x 1 day, then daily - 3 BM's now - abdomen soft, active bowel sounds - cont colace and miralax daily  3. Medication management - done with daughter - discontinue Biotin and Chloraceptic lozenges  4. OSA (obstructive sleep apnea) - does not always use  - no excessive daytime sleepiness  - ok to use PRN, when she remembers    Family/ staff Communication: plan discussed with patient, daughter and nurse  Labs/tests ordered:  bmp in 2 weeks

## 2022-11-26 DIAGNOSIS — I69928 Other speech and language deficits following unspecified cerebrovascular disease: Secondary | ICD-10-CM | POA: Diagnosis not present

## 2022-11-26 DIAGNOSIS — R41841 Cognitive communication deficit: Secondary | ICD-10-CM | POA: Diagnosis not present

## 2022-11-26 DIAGNOSIS — R278 Other lack of coordination: Secondary | ICD-10-CM | POA: Diagnosis not present

## 2022-11-26 DIAGNOSIS — F801 Expressive language disorder: Secondary | ICD-10-CM | POA: Diagnosis not present

## 2022-11-29 DIAGNOSIS — I69928 Other speech and language deficits following unspecified cerebrovascular disease: Secondary | ICD-10-CM | POA: Diagnosis not present

## 2022-11-29 DIAGNOSIS — F801 Expressive language disorder: Secondary | ICD-10-CM | POA: Diagnosis not present

## 2022-11-29 DIAGNOSIS — R278 Other lack of coordination: Secondary | ICD-10-CM | POA: Diagnosis not present

## 2022-11-29 DIAGNOSIS — R41841 Cognitive communication deficit: Secondary | ICD-10-CM | POA: Diagnosis not present

## 2022-11-30 DIAGNOSIS — L570 Actinic keratosis: Secondary | ICD-10-CM | POA: Diagnosis not present

## 2022-11-30 DIAGNOSIS — L814 Other melanin hyperpigmentation: Secondary | ICD-10-CM | POA: Diagnosis not present

## 2022-11-30 DIAGNOSIS — L821 Other seborrheic keratosis: Secondary | ICD-10-CM | POA: Diagnosis not present

## 2022-12-01 DIAGNOSIS — R278 Other lack of coordination: Secondary | ICD-10-CM | POA: Diagnosis not present

## 2022-12-01 DIAGNOSIS — I69928 Other speech and language deficits following unspecified cerebrovascular disease: Secondary | ICD-10-CM | POA: Diagnosis not present

## 2022-12-01 DIAGNOSIS — F801 Expressive language disorder: Secondary | ICD-10-CM | POA: Diagnosis not present

## 2022-12-01 DIAGNOSIS — R41841 Cognitive communication deficit: Secondary | ICD-10-CM | POA: Diagnosis not present

## 2022-12-03 DIAGNOSIS — F3341 Major depressive disorder, recurrent, in partial remission: Secondary | ICD-10-CM | POA: Diagnosis not present

## 2022-12-03 DIAGNOSIS — I69928 Other speech and language deficits following unspecified cerebrovascular disease: Secondary | ICD-10-CM | POA: Diagnosis not present

## 2022-12-03 DIAGNOSIS — F801 Expressive language disorder: Secondary | ICD-10-CM | POA: Diagnosis not present

## 2022-12-03 DIAGNOSIS — F411 Generalized anxiety disorder: Secondary | ICD-10-CM | POA: Diagnosis not present

## 2022-12-03 DIAGNOSIS — R278 Other lack of coordination: Secondary | ICD-10-CM | POA: Diagnosis not present

## 2022-12-03 DIAGNOSIS — R41841 Cognitive communication deficit: Secondary | ICD-10-CM | POA: Diagnosis not present

## 2022-12-07 ENCOUNTER — Encounter: Payer: Self-pay | Admitting: Orthopedic Surgery

## 2022-12-08 DIAGNOSIS — R41841 Cognitive communication deficit: Secondary | ICD-10-CM | POA: Diagnosis not present

## 2022-12-08 DIAGNOSIS — F801 Expressive language disorder: Secondary | ICD-10-CM | POA: Diagnosis not present

## 2022-12-08 DIAGNOSIS — R278 Other lack of coordination: Secondary | ICD-10-CM | POA: Diagnosis not present

## 2022-12-08 DIAGNOSIS — I69928 Other speech and language deficits following unspecified cerebrovascular disease: Secondary | ICD-10-CM | POA: Diagnosis not present

## 2022-12-10 DIAGNOSIS — R278 Other lack of coordination: Secondary | ICD-10-CM | POA: Diagnosis not present

## 2022-12-10 DIAGNOSIS — F801 Expressive language disorder: Secondary | ICD-10-CM | POA: Diagnosis not present

## 2022-12-10 DIAGNOSIS — R41841 Cognitive communication deficit: Secondary | ICD-10-CM | POA: Diagnosis not present

## 2022-12-10 DIAGNOSIS — I69928 Other speech and language deficits following unspecified cerebrovascular disease: Secondary | ICD-10-CM | POA: Diagnosis not present

## 2022-12-15 DIAGNOSIS — R41841 Cognitive communication deficit: Secondary | ICD-10-CM | POA: Diagnosis not present

## 2022-12-15 DIAGNOSIS — I69928 Other speech and language deficits following unspecified cerebrovascular disease: Secondary | ICD-10-CM | POA: Diagnosis not present

## 2022-12-15 DIAGNOSIS — R278 Other lack of coordination: Secondary | ICD-10-CM | POA: Diagnosis not present

## 2022-12-15 DIAGNOSIS — F801 Expressive language disorder: Secondary | ICD-10-CM | POA: Diagnosis not present

## 2022-12-17 DIAGNOSIS — R41841 Cognitive communication deficit: Secondary | ICD-10-CM | POA: Diagnosis not present

## 2022-12-17 DIAGNOSIS — I69928 Other speech and language deficits following unspecified cerebrovascular disease: Secondary | ICD-10-CM | POA: Diagnosis not present

## 2022-12-17 DIAGNOSIS — R278 Other lack of coordination: Secondary | ICD-10-CM | POA: Diagnosis not present

## 2022-12-17 DIAGNOSIS — F801 Expressive language disorder: Secondary | ICD-10-CM | POA: Diagnosis not present

## 2022-12-22 DIAGNOSIS — F801 Expressive language disorder: Secondary | ICD-10-CM | POA: Diagnosis not present

## 2022-12-22 DIAGNOSIS — R278 Other lack of coordination: Secondary | ICD-10-CM | POA: Diagnosis not present

## 2022-12-22 DIAGNOSIS — I69928 Other speech and language deficits following unspecified cerebrovascular disease: Secondary | ICD-10-CM | POA: Diagnosis not present

## 2022-12-22 DIAGNOSIS — R41841 Cognitive communication deficit: Secondary | ICD-10-CM | POA: Diagnosis not present

## 2022-12-23 DIAGNOSIS — H52203 Unspecified astigmatism, bilateral: Secondary | ICD-10-CM | POA: Diagnosis not present

## 2022-12-23 DIAGNOSIS — H532 Diplopia: Secondary | ICD-10-CM | POA: Diagnosis not present

## 2022-12-24 DIAGNOSIS — F801 Expressive language disorder: Secondary | ICD-10-CM | POA: Diagnosis not present

## 2022-12-24 DIAGNOSIS — R278 Other lack of coordination: Secondary | ICD-10-CM | POA: Diagnosis not present

## 2022-12-24 DIAGNOSIS — I69928 Other speech and language deficits following unspecified cerebrovascular disease: Secondary | ICD-10-CM | POA: Diagnosis not present

## 2022-12-24 DIAGNOSIS — R41841 Cognitive communication deficit: Secondary | ICD-10-CM | POA: Diagnosis not present

## 2022-12-28 DIAGNOSIS — L820 Inflamed seborrheic keratosis: Secondary | ICD-10-CM | POA: Diagnosis not present

## 2022-12-28 DIAGNOSIS — L570 Actinic keratosis: Secondary | ICD-10-CM | POA: Diagnosis not present

## 2022-12-30 DIAGNOSIS — R278 Other lack of coordination: Secondary | ICD-10-CM | POA: Diagnosis not present

## 2022-12-30 DIAGNOSIS — I69928 Other speech and language deficits following unspecified cerebrovascular disease: Secondary | ICD-10-CM | POA: Diagnosis not present

## 2022-12-30 DIAGNOSIS — F801 Expressive language disorder: Secondary | ICD-10-CM | POA: Diagnosis not present

## 2022-12-30 DIAGNOSIS — R41841 Cognitive communication deficit: Secondary | ICD-10-CM | POA: Diagnosis not present

## 2023-01-04 DIAGNOSIS — R41841 Cognitive communication deficit: Secondary | ICD-10-CM | POA: Diagnosis not present

## 2023-01-04 DIAGNOSIS — R278 Other lack of coordination: Secondary | ICD-10-CM | POA: Diagnosis not present

## 2023-01-04 DIAGNOSIS — I69928 Other speech and language deficits following unspecified cerebrovascular disease: Secondary | ICD-10-CM | POA: Diagnosis not present

## 2023-01-04 DIAGNOSIS — F801 Expressive language disorder: Secondary | ICD-10-CM | POA: Diagnosis not present

## 2023-01-05 DIAGNOSIS — R41841 Cognitive communication deficit: Secondary | ICD-10-CM | POA: Diagnosis not present

## 2023-01-05 DIAGNOSIS — I69928 Other speech and language deficits following unspecified cerebrovascular disease: Secondary | ICD-10-CM | POA: Diagnosis not present

## 2023-01-05 DIAGNOSIS — R278 Other lack of coordination: Secondary | ICD-10-CM | POA: Diagnosis not present

## 2023-01-05 DIAGNOSIS — F801 Expressive language disorder: Secondary | ICD-10-CM | POA: Diagnosis not present

## 2023-01-06 DIAGNOSIS — H532 Diplopia: Secondary | ICD-10-CM | POA: Diagnosis not present

## 2023-01-11 DIAGNOSIS — R278 Other lack of coordination: Secondary | ICD-10-CM | POA: Diagnosis not present

## 2023-01-11 DIAGNOSIS — I69928 Other speech and language deficits following unspecified cerebrovascular disease: Secondary | ICD-10-CM | POA: Diagnosis not present

## 2023-01-11 DIAGNOSIS — F801 Expressive language disorder: Secondary | ICD-10-CM | POA: Diagnosis not present

## 2023-01-11 DIAGNOSIS — R41841 Cognitive communication deficit: Secondary | ICD-10-CM | POA: Diagnosis not present

## 2023-01-13 DIAGNOSIS — F801 Expressive language disorder: Secondary | ICD-10-CM | POA: Diagnosis not present

## 2023-01-13 DIAGNOSIS — I69928 Other speech and language deficits following unspecified cerebrovascular disease: Secondary | ICD-10-CM | POA: Diagnosis not present

## 2023-01-13 DIAGNOSIS — R278 Other lack of coordination: Secondary | ICD-10-CM | POA: Diagnosis not present

## 2023-01-13 DIAGNOSIS — R41841 Cognitive communication deficit: Secondary | ICD-10-CM | POA: Diagnosis not present

## 2023-01-18 DIAGNOSIS — R41841 Cognitive communication deficit: Secondary | ICD-10-CM | POA: Diagnosis not present

## 2023-01-18 DIAGNOSIS — R278 Other lack of coordination: Secondary | ICD-10-CM | POA: Diagnosis not present

## 2023-01-18 DIAGNOSIS — I69928 Other speech and language deficits following unspecified cerebrovascular disease: Secondary | ICD-10-CM | POA: Diagnosis not present

## 2023-01-18 DIAGNOSIS — F801 Expressive language disorder: Secondary | ICD-10-CM | POA: Diagnosis not present

## 2023-01-20 DIAGNOSIS — R41841 Cognitive communication deficit: Secondary | ICD-10-CM | POA: Diagnosis not present

## 2023-01-20 DIAGNOSIS — F801 Expressive language disorder: Secondary | ICD-10-CM | POA: Diagnosis not present

## 2023-01-20 DIAGNOSIS — R278 Other lack of coordination: Secondary | ICD-10-CM | POA: Diagnosis not present

## 2023-01-20 DIAGNOSIS — I69928 Other speech and language deficits following unspecified cerebrovascular disease: Secondary | ICD-10-CM | POA: Diagnosis not present

## 2023-01-25 DIAGNOSIS — I69928 Other speech and language deficits following unspecified cerebrovascular disease: Secondary | ICD-10-CM | POA: Diagnosis not present

## 2023-01-25 DIAGNOSIS — F801 Expressive language disorder: Secondary | ICD-10-CM | POA: Diagnosis not present

## 2023-01-25 DIAGNOSIS — R278 Other lack of coordination: Secondary | ICD-10-CM | POA: Diagnosis not present

## 2023-01-25 DIAGNOSIS — R41841 Cognitive communication deficit: Secondary | ICD-10-CM | POA: Diagnosis not present

## 2023-01-27 DIAGNOSIS — I69928 Other speech and language deficits following unspecified cerebrovascular disease: Secondary | ICD-10-CM | POA: Diagnosis not present

## 2023-01-27 DIAGNOSIS — R278 Other lack of coordination: Secondary | ICD-10-CM | POA: Diagnosis not present

## 2023-01-27 DIAGNOSIS — R41841 Cognitive communication deficit: Secondary | ICD-10-CM | POA: Diagnosis not present

## 2023-01-27 DIAGNOSIS — F801 Expressive language disorder: Secondary | ICD-10-CM | POA: Diagnosis not present

## 2023-02-01 DIAGNOSIS — R278 Other lack of coordination: Secondary | ICD-10-CM | POA: Diagnosis not present

## 2023-02-01 DIAGNOSIS — R41841 Cognitive communication deficit: Secondary | ICD-10-CM | POA: Diagnosis not present

## 2023-02-01 DIAGNOSIS — I69928 Other speech and language deficits following unspecified cerebrovascular disease: Secondary | ICD-10-CM | POA: Diagnosis not present

## 2023-02-01 DIAGNOSIS — F801 Expressive language disorder: Secondary | ICD-10-CM | POA: Diagnosis not present

## 2023-02-03 DIAGNOSIS — R278 Other lack of coordination: Secondary | ICD-10-CM | POA: Diagnosis not present

## 2023-02-03 DIAGNOSIS — I69928 Other speech and language deficits following unspecified cerebrovascular disease: Secondary | ICD-10-CM | POA: Diagnosis not present

## 2023-02-03 DIAGNOSIS — R41841 Cognitive communication deficit: Secondary | ICD-10-CM | POA: Diagnosis not present

## 2023-02-03 DIAGNOSIS — F801 Expressive language disorder: Secondary | ICD-10-CM | POA: Diagnosis not present

## 2023-02-08 DIAGNOSIS — H903 Sensorineural hearing loss, bilateral: Secondary | ICD-10-CM | POA: Diagnosis not present

## 2023-02-10 DIAGNOSIS — H903 Sensorineural hearing loss, bilateral: Secondary | ICD-10-CM | POA: Diagnosis not present

## 2023-02-15 DIAGNOSIS — R278 Other lack of coordination: Secondary | ICD-10-CM | POA: Diagnosis not present

## 2023-02-15 DIAGNOSIS — R41841 Cognitive communication deficit: Secondary | ICD-10-CM | POA: Diagnosis not present

## 2023-02-15 DIAGNOSIS — I69928 Other speech and language deficits following unspecified cerebrovascular disease: Secondary | ICD-10-CM | POA: Diagnosis not present

## 2023-02-15 DIAGNOSIS — F801 Expressive language disorder: Secondary | ICD-10-CM | POA: Diagnosis not present

## 2023-02-17 DIAGNOSIS — I69928 Other speech and language deficits following unspecified cerebrovascular disease: Secondary | ICD-10-CM | POA: Diagnosis not present

## 2023-02-17 DIAGNOSIS — R278 Other lack of coordination: Secondary | ICD-10-CM | POA: Diagnosis not present

## 2023-02-17 DIAGNOSIS — R41841 Cognitive communication deficit: Secondary | ICD-10-CM | POA: Diagnosis not present

## 2023-02-17 DIAGNOSIS — F801 Expressive language disorder: Secondary | ICD-10-CM | POA: Diagnosis not present

## 2023-02-22 DIAGNOSIS — R41841 Cognitive communication deficit: Secondary | ICD-10-CM | POA: Diagnosis not present

## 2023-02-22 DIAGNOSIS — F801 Expressive language disorder: Secondary | ICD-10-CM | POA: Diagnosis not present

## 2023-02-22 DIAGNOSIS — R278 Other lack of coordination: Secondary | ICD-10-CM | POA: Diagnosis not present

## 2023-02-22 DIAGNOSIS — I69928 Other speech and language deficits following unspecified cerebrovascular disease: Secondary | ICD-10-CM | POA: Diagnosis not present

## 2023-02-24 DIAGNOSIS — R41841 Cognitive communication deficit: Secondary | ICD-10-CM | POA: Diagnosis not present

## 2023-02-24 DIAGNOSIS — F801 Expressive language disorder: Secondary | ICD-10-CM | POA: Diagnosis not present

## 2023-02-24 DIAGNOSIS — I69928 Other speech and language deficits following unspecified cerebrovascular disease: Secondary | ICD-10-CM | POA: Diagnosis not present

## 2023-02-24 DIAGNOSIS — R278 Other lack of coordination: Secondary | ICD-10-CM | POA: Diagnosis not present

## 2023-03-01 DIAGNOSIS — L820 Inflamed seborrheic keratosis: Secondary | ICD-10-CM | POA: Diagnosis not present

## 2023-03-01 DIAGNOSIS — L814 Other melanin hyperpigmentation: Secondary | ICD-10-CM | POA: Diagnosis not present

## 2023-03-01 DIAGNOSIS — L821 Other seborrheic keratosis: Secondary | ICD-10-CM | POA: Diagnosis not present

## 2023-03-02 DIAGNOSIS — F801 Expressive language disorder: Secondary | ICD-10-CM | POA: Diagnosis not present

## 2023-03-02 DIAGNOSIS — R278 Other lack of coordination: Secondary | ICD-10-CM | POA: Diagnosis not present

## 2023-03-02 DIAGNOSIS — R41841 Cognitive communication deficit: Secondary | ICD-10-CM | POA: Diagnosis not present

## 2023-03-02 DIAGNOSIS — I69928 Other speech and language deficits following unspecified cerebrovascular disease: Secondary | ICD-10-CM | POA: Diagnosis not present

## 2023-03-03 DIAGNOSIS — R41841 Cognitive communication deficit: Secondary | ICD-10-CM | POA: Diagnosis not present

## 2023-03-03 DIAGNOSIS — R278 Other lack of coordination: Secondary | ICD-10-CM | POA: Diagnosis not present

## 2023-03-03 DIAGNOSIS — I69928 Other speech and language deficits following unspecified cerebrovascular disease: Secondary | ICD-10-CM | POA: Diagnosis not present

## 2023-03-03 DIAGNOSIS — F801 Expressive language disorder: Secondary | ICD-10-CM | POA: Diagnosis not present

## 2023-03-11 DIAGNOSIS — F3341 Major depressive disorder, recurrent, in partial remission: Secondary | ICD-10-CM | POA: Diagnosis not present

## 2023-03-11 DIAGNOSIS — F411 Generalized anxiety disorder: Secondary | ICD-10-CM | POA: Diagnosis not present

## 2023-03-15 DIAGNOSIS — R278 Other lack of coordination: Secondary | ICD-10-CM | POA: Diagnosis not present

## 2023-03-15 DIAGNOSIS — R41841 Cognitive communication deficit: Secondary | ICD-10-CM | POA: Diagnosis not present

## 2023-03-15 DIAGNOSIS — F801 Expressive language disorder: Secondary | ICD-10-CM | POA: Diagnosis not present

## 2023-03-15 DIAGNOSIS — I69928 Other speech and language deficits following unspecified cerebrovascular disease: Secondary | ICD-10-CM | POA: Diagnosis not present

## 2023-03-17 DIAGNOSIS — R278 Other lack of coordination: Secondary | ICD-10-CM | POA: Diagnosis not present

## 2023-03-17 DIAGNOSIS — F801 Expressive language disorder: Secondary | ICD-10-CM | POA: Diagnosis not present

## 2023-03-17 DIAGNOSIS — I69928 Other speech and language deficits following unspecified cerebrovascular disease: Secondary | ICD-10-CM | POA: Diagnosis not present

## 2023-03-17 DIAGNOSIS — R41841 Cognitive communication deficit: Secondary | ICD-10-CM | POA: Diagnosis not present

## 2023-03-20 DIAGNOSIS — N390 Urinary tract infection, site not specified: Secondary | ICD-10-CM | POA: Diagnosis not present

## 2023-03-22 DIAGNOSIS — F801 Expressive language disorder: Secondary | ICD-10-CM | POA: Diagnosis not present

## 2023-03-22 DIAGNOSIS — R278 Other lack of coordination: Secondary | ICD-10-CM | POA: Diagnosis not present

## 2023-03-22 DIAGNOSIS — I69928 Other speech and language deficits following unspecified cerebrovascular disease: Secondary | ICD-10-CM | POA: Diagnosis not present

## 2023-03-22 DIAGNOSIS — R41841 Cognitive communication deficit: Secondary | ICD-10-CM | POA: Diagnosis not present

## 2023-03-24 ENCOUNTER — Encounter: Payer: Self-pay | Admitting: Internal Medicine

## 2023-03-24 ENCOUNTER — Non-Acute Institutional Stay: Payer: Self-pay | Admitting: Internal Medicine

## 2023-03-24 DIAGNOSIS — I69928 Other speech and language deficits following unspecified cerebrovascular disease: Secondary | ICD-10-CM | POA: Diagnosis not present

## 2023-03-24 DIAGNOSIS — R41841 Cognitive communication deficit: Secondary | ICD-10-CM | POA: Diagnosis not present

## 2023-03-24 DIAGNOSIS — G4733 Obstructive sleep apnea (adult) (pediatric): Secondary | ICD-10-CM | POA: Diagnosis not present

## 2023-03-24 DIAGNOSIS — Z8673 Personal history of transient ischemic attack (TIA), and cerebral infarction without residual deficits: Secondary | ICD-10-CM | POA: Diagnosis not present

## 2023-03-24 DIAGNOSIS — I10 Essential (primary) hypertension: Secondary | ICD-10-CM

## 2023-03-24 DIAGNOSIS — F339 Major depressive disorder, recurrent, unspecified: Secondary | ICD-10-CM

## 2023-03-24 DIAGNOSIS — E785 Hyperlipidemia, unspecified: Secondary | ICD-10-CM

## 2023-03-24 DIAGNOSIS — E039 Hypothyroidism, unspecified: Secondary | ICD-10-CM

## 2023-03-24 DIAGNOSIS — R278 Other lack of coordination: Secondary | ICD-10-CM | POA: Diagnosis not present

## 2023-03-24 DIAGNOSIS — K582 Mixed irritable bowel syndrome: Secondary | ICD-10-CM

## 2023-03-24 DIAGNOSIS — R4189 Other symptoms and signs involving cognitive functions and awareness: Secondary | ICD-10-CM

## 2023-03-24 DIAGNOSIS — F5101 Primary insomnia: Secondary | ICD-10-CM | POA: Diagnosis not present

## 2023-03-24 DIAGNOSIS — R001 Bradycardia, unspecified: Secondary | ICD-10-CM | POA: Diagnosis not present

## 2023-03-24 DIAGNOSIS — F801 Expressive language disorder: Secondary | ICD-10-CM | POA: Diagnosis not present

## 2023-03-24 NOTE — Progress Notes (Signed)
Location:  [facility] Room Number: AL10-A Place of Service:  ALF [phone]) Provider:  Mahlon Gammon, MD  Patient Care Team: Mahlon Gammon, MD as PCP - General (Internal Medicine) Regan Lemming, MD as PCP - Cardiology (Cardiology) Antony Contras, MD as Consulting Physician (Ophthalmology) Edwinna Areola, DO as Consulting Physician (Obstetrics and Gynecology) Dohmeier, Porfirio Mylar, MD as Consulting Physician (Neurology) Serena Colonel, MD as Consulting Physician (Otolaryngology)  Extended Emergency Contact Information Primary Emergency Contact: [facility] Address: [address]          [place], [place] [address] [place] of [place] Home Phone: [phone] Mobile Phone: [phone] Relation: Daughter Secondary Emergency Contact: Kerry Fort Address: [place], [place] Home Phone: [phone] Relation: Brother  Code Status:  DNR Goals of care: Advanced Directive information    03/24/2023    3:56 PM  Advanced Directives  Does Patient Have a Medical Advance Directive? Yes  Type of Estate agent of [place];Living will  Does patient want to make changes to medical advance directive? No - Patient declined  Copy of Healthcare Power of Attorney in Chart? Yes - validated most recent copy scanned in chart (See row information)     Chief Complaint  Patient presents with   Medical Management of Chronic Issues    Routine    Immunizations    Shingrix, Pneumonia and Influenza    HPI:  Pt is a 89 y.o. female seen today for medical management of chronic diseases.   Lives in [place] in [facility]  Patient has a history of hypertension, hyperlipidemia, sleep apnea, hypothyroidism   h/o MCA stroke in 11/23 Moderate AS Diarrhea alternate with Constipation  Doing well C/O Constipation today Also had Dysuria few days ago UA negative Culture negative Resolved with [facility] no symptoms today Walks with   her walker Wt Readings from Last 3 Encounters:  03/24/23 172 lb (78 kg)  11/24/22 172 lb 6.4 oz (78.2 kg)  11/22/22 172 lb 6.4 oz (78.2 kg)    Weight stable  Past Medical History:  Diagnosis Date   Allergy    Aortic stenosis    a. mild to mod by Echo 07/2012   Arthritis    Asymptomatic varicose veins    Benign paroxysmal vertigo    Bradycardia, unspecified    Bronchitis, not specified as acute or chronic    Cardiac murmur    Cataracts, bilateral    Chest pain, unspecified    Chronic cystitis    Depression    Diarrhea    Dyspnea    Dysuria    Frequency of micturition    Frequent urination    Gait disturbance 06/10/2014   Gastric ulcer    Hearing loss    Hemorrhoids    History of bone density study    History of colonoscopy    History of frequent urinary tract infections    History of urinary tract infection    Hx of cardiovascular stress test    a. ETT-Echo 3/12:  EF 60%, normal study   Hx of echocardiogram    a. Echo 2/14:  Mild LVH, EF 60-65%, Gr 1 diast dysfn, mild to mod AS, mean 17 mmHg, AVA 1.3 (VTI), trivial MR, mild LAE, PASP 31    Hyperlipemia    Hypertension    Hypothyroid    Left knee pain    Leg pain    Low back pain    Obstructive sleep apnea  OSA on CPAP    Other disorders of intestinal carbohydrate absorption    Other type of osteoarthritis, unspecified site    Overweight    Pain in thoracic spine    Palpitations    a. event monitor 3/14:  NSR, sinus brady   Plantar fasciitis    Plantar fasciitis    Right shoulder pain    S/P cholecystectomy    S/P TAH-BSO    Sleep apnea    Stroke (HCC)    tia 2016   TIA (transient ischemic attack)    Varicose veins of unspecified lower extremity with inflammation    Past Surgical History:  Procedure Laterality Date   ABDOMINAL HYSTERECTOMY     CHOLECYSTECTOMY     EP IMPLANTABLE DEVICE N/A 06/29/2016   Procedure: Loop Recorder Insertion;  Surgeon: Will Jorja Loa, MD;  [facility];  Service: Cardiovascular;  Laterality: N/A;   FOOT SURGERY  1955   INCONTINENCE SURGERY     LOOP RECORDER REMOVAL N/A 08/10/2017   Procedure: LOOP RECORDER REMOVAL;  Surgeon: Regan Lemming, MD;  [facility]: [facility];  Service: Cardiovascular;  Laterality: N/A;   TONSILLECTOMY      Allergies  Allergen Reactions   Bee Venom Other (See Comments)    Unknown reaction Per facility    Cipro [Ciprofloxacin Hcl] Other (See Comments)    Unknown reaction  Per facility   Crestor [Rosuvastatin] Other (See Comments)    Myalgias    Lodine [Etodolac] Other (See Comments)    Unknown reaction Per facility   Naprosyn [Naproxen] Other (See Comments)    Unknown reaction Per facility   Sulfonamide Derivatives Other (See Comments)    Unknown reaction Per facility   Tositumomab    Epipen [Epinephrine] Palpitations   Macrobid [Nitrofurantoin Macrocrystal] Rash   Metrocream [Metronidazole] Rash   Xanax [Alprazolam] Rash    Outpatient Encounter Medications as of 03/24/2023  Medication Sig   acetaminophen (TYLENOL) 500 MG tablet Take 1 tablet (500 mg total) by mouth every 8 (eight) hours as needed for moderate pain.   atorvastatin (LIPITOR) 40 MG tablet Take 1 tablet (40 mg total) by mouth daily.   clopidogrel (PLAVIX) 75 MG tablet Take 1 tablet (75 mg total) by mouth daily.   escitalopram (LEXAPRO) 10 MG tablet Take 15 mg by mouth at bedtime.   felodipine (PLENDIL) 5 MG 24 hr tablet Take 5 mg by mouth daily.   gabapentin (NEURONTIN) 100 MG capsule Take 100 mg by mouth at bedtime as needed.   indapamide (LOZOL) 2.5 MG tablet Take 2.5 mg by mouth daily.   Lactobacillus Rhamnosus, GG, (CULTURELLE) CAPS Take 500 mg by mouth 2 (two) times daily as needed.   levothyroxine (SYNTHROID, LEVOTHROID) 50 MCG tablet Take 50 mcg by mouth in the morning.   melatonin 5 MG TABS Take 5 mg by mouth daily.   polyethylene glycol (MIRALAX / GLYCOLAX) 17 g packet Take 17 g by mouth daily.   traZODone  (DESYREL) 50 MG tablet Take 50 mg by mouth daily.   [DISCONTINUED] docusate sodium (COLACE) 100 MG capsule Take 100 mg by mouth daily.   [DISCONTINUED] Magnesium 200 MG TABS Take 1 tablet by mouth daily.   No facility-administered encounter medications on file as of 03/24/2023.    Review of Systems  Constitutional:  Negative for activity change and appetite change.  HENT: Negative.    Respiratory:  Negative for cough and shortness of breath.   Cardiovascular:  Negative for  leg swelling.  Gastrointestinal:  Positive for constipation.  Genitourinary: Negative.   Musculoskeletal:  Positive for gait problem. Negative for arthralgias and myalgias.  Skin: Negative.   Neurological:  Negative for dizziness and weakness.  Psychiatric/Behavioral:  Positive for confusion. Negative for dysphoric mood and sleep disturbance.     Immunization History  Administered Date(s) Administered   DTaP 09/30/1995   Moderna SARS-COV2 Booster Vaccination 03/25/2020, 11/25/2021   Moderna Sars-Covid-2 Vaccination 07/02/2019, 07/16/2019, 11/19/2020   Respiratory Syncytial Virus Vaccine,Recomb Aduvanted(Arexvy) 06/26/2022   Tdap 06/15/2015   Pertinent  Health Maintenance Due  Topic Date Due   INFLUENZA VACCINE  Never done   DEXA SCAN  Completed      04/17/2022    9:00 PM 04/18/2022   10:00 AM 06/25/2022   10:22 AM 06/25/2022    1:53 PM 08/25/2022   12:58 PM  Fall Risk  Falls in the past year?   0 0 1  Was there an injury with Fall?   0 0 1  Fall Risk Category Calculator   0 0 2  Fall Risk Category (Retired)   Low Low   (RETIRED) Patient Fall Risk Level Moderate fall risk High fall risk High fall risk High fall risk   Patient at Risk for Falls Due to   History of fall(s) History of fall(s);Impaired balance/gait;Impaired mobility History of fall(s);Impaired balance/gait;Impaired mobility  Fall risk Follow up   Falls evaluation completed Falls evaluation completed;Education provided Falls evaluation  completed;Education provided   Functional Status Survey:    Vitals:   03/24/23 1541  BP: 123/69  Pulse: (!) 45  Resp: 20  Temp: 97.6 F (36.4 C)  SpO2: 96%  Weight: 172 lb (78 kg)  Height: 5\' 5"  (1.651 m)   Body mass index is 28.62 kg/m. Physical Exam Vitals reviewed.  Constitutional:      Appearance: Normal appearance.  HENT:     Head: Normocephalic.     Nose: Nose normal.     Mouth/Throat:     Mouth: Mucous membranes are moist.     Pharynx: Oropharynx is clear.  Eyes:     Pupils: Pupils are equal, round, and reactive to light.  Cardiovascular:     Rate and Rhythm: Normal rate and regular rhythm.     Pulses: Normal pulses.     Heart sounds: Murmur heard.  Pulmonary:     Effort: Pulmonary effort is normal.     Breath sounds: Normal breath sounds.  Abdominal:     General: Abdomen is flat. Bowel sounds are normal.     Palpations: Abdomen is soft.  Musculoskeletal:        General: No swelling.     Cervical back: Neck supple.  Skin:    General: Skin is warm.  Neurological:     General: No focal deficit present.     Mental Status: She is alert and oriented to person, place, and time.  Psychiatric:        Mood and Affect: Mood normal.        Thought Content: Thought content normal.     Labs reviewed: Recent Labs    04/16/22 1807 04/17/22 0849  NA 139 138  K 4.2 4.6  CL 100 101  CO2 30 25  GLUCOSE 98 175*  BUN 17 15  CREATININE 0.80 0.86  CALCIUM 9.5 8.9  MG  --  1.8   Recent Labs    04/16/22 1807  AST 22  ALT 18  ALKPHOS 46  BILITOT 0.7  PROT 6.9  ALBUMIN 3.8   Recent Labs    04/16/22 1807 04/17/22 0849  WBC 8.5 5.7  NEUTROABS 5.1  --   HGB 15.0 14.9  HCT 46.4* 44.7  MCV 102.4* 99.6  PLT 222 207   Lab Results  Component Value Date   TSH 1.304 04/17/2022   Lab Results  Component Value Date   HGBA1C 6.4 (H) 04/17/2022   Lab Results  Component Value Date   CHOL 181 04/17/2022   HDL 47 04/17/2022   LDLCALC 115 (H) 04/17/2022    LDLDIRECT 135.1 08/26/2010   TRIG 94 04/17/2022   CHOLHDL 3.9 04/17/2022    Significant Diagnostic Results in last 30 days:  No results found.  Assessment/Plan 1. H/O: stroke Plavix and statin  2. Bradycardia Cardiac Monitor was negative for any AV block   3. Essential hypertension Plendil and Indapamide  4. Hyperlipidemia, unspecified hyperlipidemia type Statin LDL LDL 47 in 6/23  5. Recurrent depression (HCC) Lexapro  6. OSA (obstructive sleep apnea) CPAP  7. Cognitive impairment Doing well in AL Works with ST PRn  8. Hypothyroidism, unspecified type TSH Normal in 11/23  9. Primary insomnia Doing well on Trazodone  10. Irritable bowel syndrome with both constipation and diarrhea Miralax     Family/ staff Communication:   Labs/tests ordered:

## 2023-03-29 DIAGNOSIS — I69928 Other speech and language deficits following unspecified cerebrovascular disease: Secondary | ICD-10-CM | POA: Diagnosis not present

## 2023-03-29 DIAGNOSIS — F801 Expressive language disorder: Secondary | ICD-10-CM | POA: Diagnosis not present

## 2023-03-29 DIAGNOSIS — R41841 Cognitive communication deficit: Secondary | ICD-10-CM | POA: Diagnosis not present

## 2023-03-29 DIAGNOSIS — R278 Other lack of coordination: Secondary | ICD-10-CM | POA: Diagnosis not present

## 2023-03-31 DIAGNOSIS — R41841 Cognitive communication deficit: Secondary | ICD-10-CM | POA: Diagnosis not present

## 2023-03-31 DIAGNOSIS — I69928 Other speech and language deficits following unspecified cerebrovascular disease: Secondary | ICD-10-CM | POA: Diagnosis not present

## 2023-03-31 DIAGNOSIS — R278 Other lack of coordination: Secondary | ICD-10-CM | POA: Diagnosis not present

## 2023-03-31 DIAGNOSIS — F801 Expressive language disorder: Secondary | ICD-10-CM | POA: Diagnosis not present

## 2023-04-05 DIAGNOSIS — R41841 Cognitive communication deficit: Secondary | ICD-10-CM | POA: Diagnosis not present

## 2023-04-05 DIAGNOSIS — R278 Other lack of coordination: Secondary | ICD-10-CM | POA: Diagnosis not present

## 2023-04-05 DIAGNOSIS — I69928 Other speech and language deficits following unspecified cerebrovascular disease: Secondary | ICD-10-CM | POA: Diagnosis not present

## 2023-04-05 DIAGNOSIS — F801 Expressive language disorder: Secondary | ICD-10-CM | POA: Diagnosis not present

## 2023-04-07 DIAGNOSIS — I69928 Other speech and language deficits following unspecified cerebrovascular disease: Secondary | ICD-10-CM | POA: Diagnosis not present

## 2023-04-07 DIAGNOSIS — R278 Other lack of coordination: Secondary | ICD-10-CM | POA: Diagnosis not present

## 2023-04-07 DIAGNOSIS — F801 Expressive language disorder: Secondary | ICD-10-CM | POA: Diagnosis not present

## 2023-04-07 DIAGNOSIS — R41841 Cognitive communication deficit: Secondary | ICD-10-CM | POA: Diagnosis not present

## 2023-04-12 DIAGNOSIS — F801 Expressive language disorder: Secondary | ICD-10-CM | POA: Diagnosis not present

## 2023-04-12 DIAGNOSIS — I69928 Other speech and language deficits following unspecified cerebrovascular disease: Secondary | ICD-10-CM | POA: Diagnosis not present

## 2023-04-12 DIAGNOSIS — R41841 Cognitive communication deficit: Secondary | ICD-10-CM | POA: Diagnosis not present

## 2023-04-12 DIAGNOSIS — R278 Other lack of coordination: Secondary | ICD-10-CM | POA: Diagnosis not present

## 2023-04-14 DIAGNOSIS — R278 Other lack of coordination: Secondary | ICD-10-CM | POA: Diagnosis not present

## 2023-04-14 DIAGNOSIS — F801 Expressive language disorder: Secondary | ICD-10-CM | POA: Diagnosis not present

## 2023-04-14 DIAGNOSIS — R41841 Cognitive communication deficit: Secondary | ICD-10-CM | POA: Diagnosis not present

## 2023-04-14 DIAGNOSIS — I69928 Other speech and language deficits following unspecified cerebrovascular disease: Secondary | ICD-10-CM | POA: Diagnosis not present

## 2023-04-20 DIAGNOSIS — R41841 Cognitive communication deficit: Secondary | ICD-10-CM | POA: Diagnosis not present

## 2023-04-20 DIAGNOSIS — R278 Other lack of coordination: Secondary | ICD-10-CM | POA: Diagnosis not present

## 2023-04-20 DIAGNOSIS — F801 Expressive language disorder: Secondary | ICD-10-CM | POA: Diagnosis not present

## 2023-04-20 DIAGNOSIS — I69928 Other speech and language deficits following unspecified cerebrovascular disease: Secondary | ICD-10-CM | POA: Diagnosis not present

## 2023-04-22 DIAGNOSIS — R278 Other lack of coordination: Secondary | ICD-10-CM | POA: Diagnosis not present

## 2023-04-22 DIAGNOSIS — I69928 Other speech and language deficits following unspecified cerebrovascular disease: Secondary | ICD-10-CM | POA: Diagnosis not present

## 2023-04-22 DIAGNOSIS — R41841 Cognitive communication deficit: Secondary | ICD-10-CM | POA: Diagnosis not present

## 2023-04-22 DIAGNOSIS — F801 Expressive language disorder: Secondary | ICD-10-CM | POA: Diagnosis not present

## 2023-04-26 DIAGNOSIS — F801 Expressive language disorder: Secondary | ICD-10-CM | POA: Diagnosis not present

## 2023-04-26 DIAGNOSIS — R41841 Cognitive communication deficit: Secondary | ICD-10-CM | POA: Diagnosis not present

## 2023-04-26 DIAGNOSIS — I69928 Other speech and language deficits following unspecified cerebrovascular disease: Secondary | ICD-10-CM | POA: Diagnosis not present

## 2023-04-26 DIAGNOSIS — R278 Other lack of coordination: Secondary | ICD-10-CM | POA: Diagnosis not present

## 2023-04-29 DIAGNOSIS — F801 Expressive language disorder: Secondary | ICD-10-CM | POA: Diagnosis not present

## 2023-04-29 DIAGNOSIS — R41841 Cognitive communication deficit: Secondary | ICD-10-CM | POA: Diagnosis not present

## 2023-04-29 DIAGNOSIS — I69928 Other speech and language deficits following unspecified cerebrovascular disease: Secondary | ICD-10-CM | POA: Diagnosis not present

## 2023-04-29 DIAGNOSIS — R278 Other lack of coordination: Secondary | ICD-10-CM | POA: Diagnosis not present

## 2023-05-03 DIAGNOSIS — F801 Expressive language disorder: Secondary | ICD-10-CM | POA: Diagnosis not present

## 2023-05-03 DIAGNOSIS — I69928 Other speech and language deficits following unspecified cerebrovascular disease: Secondary | ICD-10-CM | POA: Diagnosis not present

## 2023-05-03 DIAGNOSIS — R41841 Cognitive communication deficit: Secondary | ICD-10-CM | POA: Diagnosis not present

## 2023-05-03 DIAGNOSIS — R278 Other lack of coordination: Secondary | ICD-10-CM | POA: Diagnosis not present

## 2023-05-06 DIAGNOSIS — F801 Expressive language disorder: Secondary | ICD-10-CM | POA: Diagnosis not present

## 2023-05-06 DIAGNOSIS — R41841 Cognitive communication deficit: Secondary | ICD-10-CM | POA: Diagnosis not present

## 2023-05-06 DIAGNOSIS — I69928 Other speech and language deficits following unspecified cerebrovascular disease: Secondary | ICD-10-CM | POA: Diagnosis not present

## 2023-05-06 DIAGNOSIS — R278 Other lack of coordination: Secondary | ICD-10-CM | POA: Diagnosis not present

## 2023-05-10 DIAGNOSIS — R41841 Cognitive communication deficit: Secondary | ICD-10-CM | POA: Diagnosis not present

## 2023-05-10 DIAGNOSIS — I69928 Other speech and language deficits following unspecified cerebrovascular disease: Secondary | ICD-10-CM | POA: Diagnosis not present

## 2023-05-10 DIAGNOSIS — R278 Other lack of coordination: Secondary | ICD-10-CM | POA: Diagnosis not present

## 2023-05-10 DIAGNOSIS — F801 Expressive language disorder: Secondary | ICD-10-CM | POA: Diagnosis not present

## 2023-05-11 DIAGNOSIS — R278 Other lack of coordination: Secondary | ICD-10-CM | POA: Diagnosis not present

## 2023-05-11 DIAGNOSIS — R41841 Cognitive communication deficit: Secondary | ICD-10-CM | POA: Diagnosis not present

## 2023-05-11 DIAGNOSIS — F801 Expressive language disorder: Secondary | ICD-10-CM | POA: Diagnosis not present

## 2023-05-11 DIAGNOSIS — I69928 Other speech and language deficits following unspecified cerebrovascular disease: Secondary | ICD-10-CM | POA: Diagnosis not present

## 2023-05-17 DIAGNOSIS — R278 Other lack of coordination: Secondary | ICD-10-CM | POA: Diagnosis not present

## 2023-05-17 DIAGNOSIS — I69928 Other speech and language deficits following unspecified cerebrovascular disease: Secondary | ICD-10-CM | POA: Diagnosis not present

## 2023-05-17 DIAGNOSIS — R41841 Cognitive communication deficit: Secondary | ICD-10-CM | POA: Diagnosis not present

## 2023-05-17 DIAGNOSIS — F801 Expressive language disorder: Secondary | ICD-10-CM | POA: Diagnosis not present

## 2023-05-20 DIAGNOSIS — F801 Expressive language disorder: Secondary | ICD-10-CM | POA: Diagnosis not present

## 2023-05-20 DIAGNOSIS — R278 Other lack of coordination: Secondary | ICD-10-CM | POA: Diagnosis not present

## 2023-05-20 DIAGNOSIS — R41841 Cognitive communication deficit: Secondary | ICD-10-CM | POA: Diagnosis not present

## 2023-05-20 DIAGNOSIS — I69928 Other speech and language deficits following unspecified cerebrovascular disease: Secondary | ICD-10-CM | POA: Diagnosis not present

## 2023-05-25 DIAGNOSIS — I69928 Other speech and language deficits following unspecified cerebrovascular disease: Secondary | ICD-10-CM | POA: Diagnosis not present

## 2023-05-25 DIAGNOSIS — R41841 Cognitive communication deficit: Secondary | ICD-10-CM | POA: Diagnosis not present

## 2023-05-25 DIAGNOSIS — R278 Other lack of coordination: Secondary | ICD-10-CM | POA: Diagnosis not present

## 2023-05-25 DIAGNOSIS — F801 Expressive language disorder: Secondary | ICD-10-CM | POA: Diagnosis not present

## 2023-05-27 DIAGNOSIS — I69928 Other speech and language deficits following unspecified cerebrovascular disease: Secondary | ICD-10-CM | POA: Diagnosis not present

## 2023-05-27 DIAGNOSIS — R41841 Cognitive communication deficit: Secondary | ICD-10-CM | POA: Diagnosis not present

## 2023-05-27 DIAGNOSIS — R278 Other lack of coordination: Secondary | ICD-10-CM | POA: Diagnosis not present

## 2023-05-27 DIAGNOSIS — F801 Expressive language disorder: Secondary | ICD-10-CM | POA: Diagnosis not present

## 2023-06-01 DIAGNOSIS — I69928 Other speech and language deficits following unspecified cerebrovascular disease: Secondary | ICD-10-CM | POA: Diagnosis not present

## 2023-06-01 DIAGNOSIS — F801 Expressive language disorder: Secondary | ICD-10-CM | POA: Diagnosis not present

## 2023-06-01 DIAGNOSIS — R278 Other lack of coordination: Secondary | ICD-10-CM | POA: Diagnosis not present

## 2023-06-01 DIAGNOSIS — R41841 Cognitive communication deficit: Secondary | ICD-10-CM | POA: Diagnosis not present

## 2023-06-03 DIAGNOSIS — H04123 Dry eye syndrome of bilateral lacrimal glands: Secondary | ICD-10-CM | POA: Diagnosis not present

## 2023-06-03 DIAGNOSIS — R41841 Cognitive communication deficit: Secondary | ICD-10-CM | POA: Diagnosis not present

## 2023-06-03 DIAGNOSIS — I69928 Other speech and language deficits following unspecified cerebrovascular disease: Secondary | ICD-10-CM | POA: Diagnosis not present

## 2023-06-03 DIAGNOSIS — H524 Presbyopia: Secondary | ICD-10-CM | POA: Diagnosis not present

## 2023-06-03 DIAGNOSIS — F801 Expressive language disorder: Secondary | ICD-10-CM | POA: Diagnosis not present

## 2023-06-03 DIAGNOSIS — Z961 Presence of intraocular lens: Secondary | ICD-10-CM | POA: Diagnosis not present

## 2023-06-03 DIAGNOSIS — H40013 Open angle with borderline findings, low risk, bilateral: Secondary | ICD-10-CM | POA: Diagnosis not present

## 2023-06-03 DIAGNOSIS — R278 Other lack of coordination: Secondary | ICD-10-CM | POA: Diagnosis not present

## 2023-06-09 DIAGNOSIS — I69928 Other speech and language deficits following unspecified cerebrovascular disease: Secondary | ICD-10-CM | POA: Diagnosis not present

## 2023-06-09 DIAGNOSIS — R41841 Cognitive communication deficit: Secondary | ICD-10-CM | POA: Diagnosis not present

## 2023-06-09 DIAGNOSIS — F801 Expressive language disorder: Secondary | ICD-10-CM | POA: Diagnosis not present

## 2023-06-09 DIAGNOSIS — R278 Other lack of coordination: Secondary | ICD-10-CM | POA: Diagnosis not present

## 2023-06-10 DIAGNOSIS — F411 Generalized anxiety disorder: Secondary | ICD-10-CM | POA: Diagnosis not present

## 2023-06-10 DIAGNOSIS — R278 Other lack of coordination: Secondary | ICD-10-CM | POA: Diagnosis not present

## 2023-06-10 DIAGNOSIS — F801 Expressive language disorder: Secondary | ICD-10-CM | POA: Diagnosis not present

## 2023-06-10 DIAGNOSIS — R41841 Cognitive communication deficit: Secondary | ICD-10-CM | POA: Diagnosis not present

## 2023-06-10 DIAGNOSIS — F3341 Major depressive disorder, recurrent, in partial remission: Secondary | ICD-10-CM | POA: Diagnosis not present

## 2023-06-10 DIAGNOSIS — I69928 Other speech and language deficits following unspecified cerebrovascular disease: Secondary | ICD-10-CM | POA: Diagnosis not present

## 2023-06-17 DIAGNOSIS — R41841 Cognitive communication deficit: Secondary | ICD-10-CM | POA: Diagnosis not present

## 2023-06-17 DIAGNOSIS — I69928 Other speech and language deficits following unspecified cerebrovascular disease: Secondary | ICD-10-CM | POA: Diagnosis not present

## 2023-06-17 DIAGNOSIS — R278 Other lack of coordination: Secondary | ICD-10-CM | POA: Diagnosis not present

## 2023-06-17 DIAGNOSIS — F801 Expressive language disorder: Secondary | ICD-10-CM | POA: Diagnosis not present

## 2023-06-21 DIAGNOSIS — R41841 Cognitive communication deficit: Secondary | ICD-10-CM | POA: Diagnosis not present

## 2023-06-21 DIAGNOSIS — R278 Other lack of coordination: Secondary | ICD-10-CM | POA: Diagnosis not present

## 2023-06-21 DIAGNOSIS — F801 Expressive language disorder: Secondary | ICD-10-CM | POA: Diagnosis not present

## 2023-06-21 DIAGNOSIS — I69928 Other speech and language deficits following unspecified cerebrovascular disease: Secondary | ICD-10-CM | POA: Diagnosis not present

## 2023-06-27 ENCOUNTER — Encounter: Payer: Self-pay | Admitting: Orthopedic Surgery

## 2023-06-27 ENCOUNTER — Non-Acute Institutional Stay: Payer: Medicare PPO | Admitting: Orthopedic Surgery

## 2023-06-27 DIAGNOSIS — R0981 Nasal congestion: Secondary | ICD-10-CM

## 2023-06-27 DIAGNOSIS — W19XXXA Unspecified fall, initial encounter: Secondary | ICD-10-CM

## 2023-06-27 DIAGNOSIS — M25572 Pain in left ankle and joints of left foot: Secondary | ICD-10-CM

## 2023-06-27 DIAGNOSIS — R509 Fever, unspecified: Secondary | ICD-10-CM

## 2023-06-27 MED ORDER — ZINC GLUCONATE 50 MG PO TABS: 50.0000 mg | ORAL_TABLET | Freq: Every day | ORAL | Status: AC

## 2023-06-27 MED ORDER — CETIRIZINE HCL 10 MG PO TABS: 10.0000 mg | ORAL_TABLET | Freq: Every day | ORAL | Status: DC

## 2023-06-27 MED ORDER — VITAMIN C 1000 MG PO TABS: 1000.0000 mg | ORAL_TABLET | Freq: Every day | ORAL | Status: AC

## 2023-06-27 NOTE — Progress Notes (Signed)
 Location:   [facility] Room Number: 10-A Place of Service:  ALF [phone]) Provider:  Greig Cluster, NP  PCP: Charlanne Fredia CROME, MD  Patient Care Team: Charlanne Fredia CROME, MD as PCP - General (Internal Medicine) Inocencio Soyla Lunger, MD as PCP - Cardiology (Cardiology) Charmayne Molly, MD as Consulting Physician (Ophthalmology) Delana Ted Morrison, DO as Consulting Physician (Obstetrics and Gynecology) Dohmeier, Dedra, MD as Consulting Physician (Neurology) Jesus Oliphant, MD as Consulting Physician (Otolaryngology)  Extended Emergency Contact Information Primary Emergency Contact: [facility] Address: [address]          [place], [place] [address] [place] Home Phone: [phone] Mobile Phone: [phone] Relation: Daughter Secondary Emergency Contact: Pattricia Lenis Address: [place]  [place] Home Phone: [phone] Relation: Brother  Code Status:  DNR Goals of care: Advanced Directive information    06/27/2023   10:24 AM  Advanced Directives  Does Patient Have a Medical Advance Directive? Yes  Type of Estate Agent of [place];Living will;Out of facility DNR (pink MOST or yellow form)  Does patient want to make changes to medical advance directive? No - Patient declined  Copy of Healthcare Power of Attorney in Chart? Yes - validated most recent copy scanned in chart (See row information)     Chief Complaint  Patient presents with   Acute Visit    Left ankle pain.    HPI:  Pt is a 89 y.o. female seen today for an acute visit due to left ankle pain.   She currently resides on the assisted living unit at [facility]. PMH: HTN, HLD, loop recorder removal 2019, sleep apnea, hypothyroidism, ischemic stroke to Left MCA 04/2022, aortic stenosis, unstable gait, insomnia and depression.   01/13 she was found on the floor during routine nursing checks. She c/o increased left ankle pain  after incident. No injury to head. Xray left ankle ordered> pending. She is WBAT without difficulty. Described pain as mild.   Nursing reports increased nasal congestion and fever x 3 days. Covid negative today. Rapid flu> pending. She denies cough, sore throat, body aches or malaise. Nasal congestion clear. Admits to blowing her nose more often. Current Temp 98.0, other vitals stable.    Past Medical History:  Diagnosis Date   Allergy    Aortic stenosis    a. mild to mod by Echo 07/2012   Arthritis    Asymptomatic varicose veins    Benign paroxysmal vertigo    Bradycardia, unspecified    Bronchitis, not specified as acute or chronic    Cardiac murmur    Cataracts, bilateral    Chest pain, unspecified    Chronic cystitis    Depression    Diarrhea    Dyspnea    Dysuria    Frequency of micturition    Frequent urination    Gait disturbance 06/10/2014   Gastric ulcer    Hearing loss    Hemorrhoids    History of bone density study    History of colonoscopy    History of frequent urinary tract infections    History of urinary tract infection    Hx of cardiovascular stress test    a. ETT-Echo 3/12:  EF 60%, normal study   Hx of echocardiogram    a. Echo 2/14:  Mild LVH, EF 60-65%, Gr 1 diast dysfn, mild to mod AS, mean 17 mmHg, AVA 1.3 (VTI), trivial MR, mild LAE, PASP 31  Hyperlipemia    Hypertension    Hypothyroid    Left knee pain    Leg pain    Low back pain    Obstructive sleep apnea    OSA on CPAP    Other disorders of intestinal carbohydrate absorption    Other type of osteoarthritis, unspecified site    Overweight    Pain in thoracic spine    Palpitations    a. event monitor 3/14:  NSR, sinus brady   Plantar fasciitis    Plantar fasciitis    Right shoulder pain    S/P cholecystectomy    S/P TAH-BSO    Sleep apnea    Stroke (HCC)    tia 2016   TIA (transient ischemic attack)    Varicose veins of unspecified lower extremity with inflammation    Past  Surgical History:  Procedure Laterality Date   ABDOMINAL HYSTERECTOMY     CHOLECYSTECTOMY     EP IMPLANTABLE DEVICE N/A 06/29/2016   Procedure: Loop Recorder Insertion;  Surgeon: Will Gladis Norton, MD;  [facility];  Service: Cardiovascular;  Laterality: N/A;   FOOT SURGERY  1955   INCONTINENCE SURGERY     LOOP RECORDER REMOVAL N/A 08/10/2017   Procedure: LOOP RECORDER REMOVAL;  Surgeon: Norton Soyla Gladis, MD;  [facility]: [facility];  Service: Cardiovascular;  Laterality: N/A;   TONSILLECTOMY      Allergies  Allergen Reactions   Bee Venom Other (See Comments)    Unknown reaction Per facility    Cipro [Ciprofloxacin Hcl] Other (See Comments)    Unknown reaction  Per facility   Crestor [Rosuvastatin] Other (See Comments)    Myalgias    Lodine [Etodolac] Other (See Comments)    Unknown reaction Per facility   Naprosyn [Naproxen] Other (See Comments)    Unknown reaction Per facility   Sulfonamide Derivatives Other (See Comments)    Unknown reaction Per facility   Tositumomab    Epipen  [Epinephrine ] Palpitations   Macrobid [Nitrofurantoin Macrocrystal] Rash   Metrocream [Metronidazole] Rash   Xanax [Alprazolam] Rash    Allergies as of 06/27/2023       Reactions   Bee Venom Other (See Comments)   Unknown reaction Per facility    Cipro [ciprofloxacin Hcl] Other (See Comments)   Unknown reaction  Per facility   Crestor [rosuvastatin] Other (See Comments)   Myalgias    Lodine [etodolac] Other (See Comments)   Unknown reaction Per facility   Naprosyn [naproxen] Other (See Comments)   Unknown reaction Per facility   Sulfonamide Derivatives Other (See Comments)   Unknown reaction Per facility   Tositumomab    Epipen  Eztli.fireman ] Palpitations   Macrobid [nitrofurantoin Macrocrystal] Rash   Metrocream [metronidazole] Rash   Xanax [alprazolam] Rash        Medication List        Accurate as of June 27, 2023 10:24 AM. If you have  any questions, ask your nurse or doctor.          STOP taking these medications    Culturelle Caps Stopped by: Sharalyn Lomba E Arnez Stoneking       TAKE these medications    acetaminophen  500 MG tablet Commonly known as: TYLENOL  Take 1 tablet (500 mg total) by mouth every 8 (eight) hours as needed for moderate pain.   atorvastatin  40 MG tablet Commonly known as: LIPITOR Take 1 tablet (40 mg total) by mouth daily.   clopidogrel  75 MG tablet Commonly known as: PLAVIX  Take  1 tablet (75 mg total) by mouth daily.   CULTURELLE DIGESTIVE HEALTH PO Take 500 mg by mouth as needed (for loose stool or antibiotic use twice daily as needed.).   escitalopram  10 MG tablet Commonly known as: LEXAPRO  Take 15 mg by mouth at bedtime.   felodipine  5 MG 24 hr tablet Commonly known as: PLENDIL  Take 5 mg by mouth daily.   gabapentin 100 MG capsule Commonly known as: NEURONTIN Take 100 mg by mouth at bedtime as needed.   GUAIFENESIN PO Take 10 mLs by mouth every 6 (six) hours as needed.   indapamide  2.5 MG tablet Commonly known as: LOZOL  Take 2.5 mg by mouth daily.   KETOCONAZOLE (TOPICAL) 1 % Sham Apply 1 Application topically 2 (two) times a week. Tuesday & Friday   levothyroxine  50 MCG tablet Commonly known as: SYNTHROID  Take 50 mcg by mouth in the morning.   melatonin 5 MG Tabs Take 5 mg by mouth daily.   polyethylene glycol 17 g packet Commonly known as: MIRALAX / GLYCOLAX Take 17 g by mouth daily.   traZODone  50 MG tablet Commonly known as: DESYREL  Take 50 mg by mouth daily.        Review of Systems  Unable to perform ROS: Dementia    Immunization History  Administered Date(s) Administered   DTaP 09/30/1995   Moderna SARS-COV2 Booster Vaccination 03/25/2020, 11/25/2021   Moderna Sars-Covid-2 Vaccination 07/02/2019, 07/16/2019, 11/19/2020   Respiratory Syncytial Virus Vaccine,Recomb Aduvanted(Arexvy) 06/26/2022   Tdap 06/15/2015   Pertinent  Health Maintenance Due   Topic Date Due   INFLUENZA VACCINE  Never done   DEXA SCAN  Completed      04/17/2022    9:00 PM 04/18/2022   10:00 AM 06/25/2022   10:22 AM 06/25/2022    1:53 PM 08/25/2022   12:58 PM  Fall Risk  Falls in the past year?   0 0 1  Was there an injury with Fall?   0 0 1  Fall Risk Category Calculator   0 0 2  Fall Risk Category (Retired)   Low Low   (RETIRED) Patient Fall Risk Level Moderate fall risk High fall risk High fall risk High fall risk   Patient at Risk for Falls Due to   History of fall(s) History of fall(s);Impaired balance/gait;Impaired mobility History of fall(s);Impaired balance/gait;Impaired mobility  Fall risk Follow up   Falls evaluation completed Falls evaluation completed;Education provided Falls evaluation completed;Education provided   Functional Status Survey:    Vitals:   06/27/23 1000  BP: 123/65  Pulse: (!) 54  Resp: 19  Temp: 98.6 F (37 C)  SpO2: 95%  Weight: 176 lb 6.4 oz (80 kg)  Height: 5' 5 (1.651 m)   Body mass index is 29.35 kg/m. Physical Exam Vitals reviewed.  Constitutional:      General: She is not in acute distress.    Appearance: She is not ill-appearing.  HENT:     Head: Normocephalic and atraumatic.     Nose: Congestion present.     Right Turbinates: Enlarged.     Left Turbinates: Enlarged.     Mouth/Throat:     Mouth: Mucous membranes are moist.  Eyes:     General:        Right eye: No discharge.        Left eye: No discharge.  Cardiovascular:     Rate and Rhythm: Regular rhythm. Bradycardia present.     Pulses: Normal pulses.     Heart sounds:  Murmur heard.  Pulmonary:     Effort: Pulmonary effort is normal. No respiratory distress.     Breath sounds: Normal breath sounds. No wheezing, rhonchi or rales.  Abdominal:     General: Bowel sounds are normal.     Palpations: Abdomen is soft.  Musculoskeletal:     Cervical back: Neck supple.     Right lower leg: No edema.     Left lower leg: No edema.     Left ankle:  Swelling present. No deformity. No tenderness. Normal range of motion.     Comments: Increased swelling near left lateral malleolus, FROM, WBAT  Lymphadenopathy:     Cervical: No cervical adenopathy.  Skin:    General: Skin is warm.     Capillary Refill: Capillary refill takes less than 2 seconds.  Neurological:     General: No focal deficit present.     Mental Status: She is alert. Mental status is at baseline.     Gait: Gait abnormal.     Comments: rolator  Psychiatric:        Mood and Affect: Mood normal.     Labs reviewed: Recent Labs    11/20/22 0000  NA 142  K 3.5  CL 103  CO2 28*  BUN 14  CREATININE 0.8  CALCIUM  8.4*   Recent Labs    11/20/22 0000  AST 13  ALT 7  ALKPHOS 52  ALBUMIN 3.4*   Recent Labs    11/20/22 0000  WBC 6.6  NEUTROABS 3,703.00  HGB 13.5  HCT 40  PLT 215   Lab Results  Component Value Date   TSH 0.80 11/23/2022   Lab Results  Component Value Date   HGBA1C 6.7 11/23/2022   Lab Results  Component Value Date   CHOL 105 11/20/2022   HDL 39 11/20/2022   LDLCALC 47 11/20/2022   LDLDIRECT 135.1 08/26/2010   TRIG 100 11/20/2022   CHOLHDL 3.9 04/17/2022    Significant Diagnostic Results in last 30 days:  No results found.  Assessment/Plan 1. Acute left ankle pain (Primary) - 01/13 fall> found sitting on floor - increased left ankle pain  - increased swelling near lateral malleolus, FROM, WBAT - xray left ankle> pending  - recommend ice QID x 5 days - cont tylenol  prn for pain   2. Fall, initial encounter - see above - PT/OT evaluation  3. Fever, unspecified fever cause - no fever at this time - covid negative - rapid flu pending  - start vitamin C  1000 mg po daily x 7 days - start Zinc  50 mg po daily x 7 days  4. Nasal congestion - see above - nasal turbinates enlarged, clear drainage - start zyrtec  10 mg po at bedtime x 10 days    Family/ staff Communication: plan discussed with patient and  nurse  Labs/tests ordered:  PT/OT evaluation

## 2023-06-28 DIAGNOSIS — R278 Other lack of coordination: Secondary | ICD-10-CM | POA: Diagnosis not present

## 2023-06-28 DIAGNOSIS — I69928 Other speech and language deficits following unspecified cerebrovascular disease: Secondary | ICD-10-CM | POA: Diagnosis not present

## 2023-06-28 DIAGNOSIS — F801 Expressive language disorder: Secondary | ICD-10-CM | POA: Diagnosis not present

## 2023-06-28 DIAGNOSIS — R41841 Cognitive communication deficit: Secondary | ICD-10-CM | POA: Diagnosis not present

## 2023-06-29 DIAGNOSIS — I69928 Other speech and language deficits following unspecified cerebrovascular disease: Secondary | ICD-10-CM | POA: Diagnosis not present

## 2023-06-29 DIAGNOSIS — S93402A Sprain of unspecified ligament of left ankle, initial encounter: Secondary | ICD-10-CM | POA: Diagnosis not present

## 2023-06-29 DIAGNOSIS — F801 Expressive language disorder: Secondary | ICD-10-CM | POA: Diagnosis not present

## 2023-06-29 DIAGNOSIS — Z9181 History of falling: Secondary | ICD-10-CM | POA: Diagnosis not present

## 2023-06-29 DIAGNOSIS — R278 Other lack of coordination: Secondary | ICD-10-CM | POA: Diagnosis not present

## 2023-06-29 DIAGNOSIS — R41841 Cognitive communication deficit: Secondary | ICD-10-CM | POA: Diagnosis not present

## 2023-06-29 DIAGNOSIS — R2681 Unsteadiness on feet: Secondary | ICD-10-CM | POA: Diagnosis not present

## 2023-06-29 DIAGNOSIS — M6281 Muscle weakness (generalized): Secondary | ICD-10-CM | POA: Diagnosis not present

## 2023-06-30 ENCOUNTER — Non-Acute Institutional Stay: Payer: Self-pay | Admitting: Internal Medicine

## 2023-06-30 ENCOUNTER — Encounter: Payer: Self-pay | Admitting: Internal Medicine

## 2023-06-30 ENCOUNTER — Encounter: Payer: Self-pay | Admitting: Orthopedic Surgery

## 2023-06-30 DIAGNOSIS — R4781 Slurred speech: Secondary | ICD-10-CM | POA: Diagnosis not present

## 2023-06-30 DIAGNOSIS — S93402A Sprain of unspecified ligament of left ankle, initial encounter: Secondary | ICD-10-CM | POA: Diagnosis not present

## 2023-06-30 DIAGNOSIS — Z8673 Personal history of transient ischemic attack (TIA), and cerebral infarction without residual deficits: Secondary | ICD-10-CM | POA: Diagnosis not present

## 2023-06-30 DIAGNOSIS — R2681 Unsteadiness on feet: Secondary | ICD-10-CM | POA: Diagnosis not present

## 2023-06-30 DIAGNOSIS — W19XXXS Unspecified fall, sequela: Secondary | ICD-10-CM

## 2023-06-30 DIAGNOSIS — R41841 Cognitive communication deficit: Secondary | ICD-10-CM | POA: Diagnosis not present

## 2023-06-30 DIAGNOSIS — I69928 Other speech and language deficits following unspecified cerebrovascular disease: Secondary | ICD-10-CM | POA: Diagnosis not present

## 2023-06-30 DIAGNOSIS — M25572 Pain in left ankle and joints of left foot: Secondary | ICD-10-CM

## 2023-06-30 DIAGNOSIS — R278 Other lack of coordination: Secondary | ICD-10-CM | POA: Diagnosis not present

## 2023-06-30 DIAGNOSIS — M6281 Muscle weakness (generalized): Secondary | ICD-10-CM | POA: Diagnosis not present

## 2023-06-30 DIAGNOSIS — F801 Expressive language disorder: Secondary | ICD-10-CM | POA: Diagnosis not present

## 2023-06-30 DIAGNOSIS — R052 Subacute cough: Secondary | ICD-10-CM | POA: Diagnosis not present

## 2023-06-30 DIAGNOSIS — Z9181 History of falling: Secondary | ICD-10-CM | POA: Diagnosis not present

## 2023-07-01 DIAGNOSIS — N390 Urinary tract infection, site not specified: Secondary | ICD-10-CM | POA: Diagnosis not present

## 2023-07-01 NOTE — Progress Notes (Unsigned)
Location: [facility] of Service:  ALF ([id number])  Provider:   Code Status: DNR Goals of Care:     06/27/2023   10:24 AM  Advanced Directives  Does Patient Have a Medical Advance Directive? Yes  Type of Estate agent of [place];Living will;Out of facility DNR (pink MOST or yellow form)  Does patient want to make changes to medical advance directive? No - Patient declined  Copy of Healthcare Power of Attorney in Chart? Yes - validated most recent copy scanned in chart (See row information)     Chief Complaint  Patient presents with   Acute Visit    HPI: Patient is a 89 y.o. female seen today for an acute visit for Possible episode of Aphasia over the weekend   Lives in [place] in [facility]   Patient has a history of hypertension, hyperlipidemia, sleep apnea, hypothyroidism   h/o MCA stroke in 11/23 Moderate AS Diarrhea alternate with Constipation  Over the weekend patient had a fall.  Patient states that she was with her walker and just slipped and fell.  Next day her daughter noticed the patient had some slurring speech She was also seen by speech therapist also noticed some Speech slurring Patient also was noticed to have some decline in following complex Commands I talked to  her daughter in the room today Per her daughter patient is back to her baseline today Patient does not remember having slurred speech  Over the weekend patient also had an episode of coughing and a low-grade temp.  She was checked for both flu and COVID and was negative She does not have any fever anymore but has noticed some coughing especially at night. She also had some pain in her left ankle the x-ray was negative this was after her fall.  She is able to walk with her walker  Past Medical History:  Diagnosis Date   Allergy    Aortic stenosis    a. mild to mod by Echo 07/2012   Arthritis    Asymptomatic varicose veins    Benign paroxysmal vertigo    Bradycardia,  unspecified    Bronchitis, not specified as acute or chronic    Cardiac murmur    Cataracts, bilateral    Chest pain, unspecified    Chronic cystitis    Depression    Diarrhea    Dyspnea    Dysuria    Frequency of micturition    Frequent urination    Gait disturbance 06/10/2014   Gastric ulcer    Hearing loss    Hemorrhoids    History of bone density study    History of colonoscopy    History of frequent urinary tract infections    History of urinary tract infection    Hx of cardiovascular stress test    a. ETT-Echo 3/12:  EF 60%, normal study   Hx of echocardiogram    a. Echo 2/14:  Mild LVH, EF 60-65%, Gr 1 diast dysfn, mild to mod AS, mean 17 mmHg, AVA 1.3 (VTI), trivial MR, mild LAE, PASP 31    Hyperlipemia    Hypertension    Hypothyroid    Left knee pain    Leg pain    Low back pain    Obstructive sleep apnea    OSA on CPAP    Other disorders of intestinal carbohydrate absorption    Other type of osteoarthritis, unspecified site    Overweight    Pain in thoracic  spine    Palpitations    a. event monitor 3/14:  NSR, sinus brady   Plantar fasciitis    Plantar fasciitis    Right shoulder pain    S/P cholecystectomy    S/P TAH-BSO    Sleep apnea    Stroke (HCC)    tia 2016   TIA (transient ischemic attack)    Varicose veins of unspecified lower extremity with inflammation     Past Surgical History:  Procedure Laterality Date   ABDOMINAL HYSTERECTOMY     CHOLECYSTECTOMY     EP IMPLANTABLE DEVICE N/A 06/29/2016   Procedure: Loop Recorder Insertion;  Surgeon: Will Jorja Loa, MD;  Location: [facility];  Service: Cardiovascular;  Laterality: N/A;   FOOT SURGERY  1955   INCONTINENCE SURGERY     LOOP RECORDER REMOVAL N/A 08/10/2017   Procedure: LOOP RECORDER REMOVAL;  Surgeon: Regan Lemming, MD;  Location: [facility];  Service: Cardiovascular;  Laterality: N/A;   TONSILLECTOMY      Allergies  Allergen Reactions   Bee Venom Other  (See Comments)    Unknown reaction Per facility    Cipro [Ciprofloxacin Hcl] Other (See Comments)    Unknown reaction  Per facility   Crestor [Rosuvastatin] Other (See Comments)    Myalgias    Lodine [Etodolac] Other (See Comments)    Unknown reaction Per facility   Naprosyn [Naproxen] Other (See Comments)    Unknown reaction Per facility   Sulfonamide Derivatives Other (See Comments)    Unknown reaction Per facility   Tositumomab    Epipen [Epinephrine] Palpitations   Macrobid [Nitrofurantoin Macrocrystal] Rash   Metrocream [Metronidazole] Rash   Xanax [Alprazolam] Rash    Outpatient Encounter Medications as of 06/30/2023  Medication Sig   acetaminophen (TYLENOL) 500 MG tablet Take 1 tablet (500 mg total) by mouth every 8 (eight) hours as needed for moderate pain.   Ascorbic Acid (VITAMIN C) 1000 MG tablet Take 1 tablet (1,000 mg total) by mouth daily for 7 days.   atorvastatin (LIPITOR) 40 MG tablet Take 1 tablet (40 mg total) by mouth daily.   cetirizine (ZYRTEC) 10 MG tablet Take 1 tablet (10 mg total) by mouth at bedtime for 10 days.   clopidogrel (PLAVIX) 75 MG tablet Take 1 tablet (75 mg total) by mouth daily.   escitalopram (LEXAPRO) 10 MG tablet Take 15 mg by mouth at bedtime.   felodipine (PLENDIL) 5 MG 24 hr tablet Take 5 mg by mouth daily.   gabapentin (NEURONTIN) 100 MG capsule Take 100 mg by mouth at bedtime as needed.   GUAIFENESIN PO Take 10 mLs by mouth every 6 (six) hours as needed.   indapamide (LOZOL) 2.5 MG tablet Take 2.5 mg by mouth daily.   KETOCONAZOLE, TOPICAL, 1 % SHAM Apply 1 Application topically 2 (two) times a week. Tuesday & Friday   Lactobacillus-Inulin (CULTURELLE DIGESTIVE HEALTH PO) Take 500 mg by mouth as needed (for loose stool or antibiotic use twice daily as needed.).   levothyroxine (SYNTHROID, LEVOTHROID) 50 MCG tablet Take 50 mcg by mouth in the morning.   melatonin 5 MG TABS Take 5 mg by mouth daily.   polyethylene glycol (MIRALAX /  GLYCOLAX) 17 g packet Take 17 g by mouth daily.   traZODone (DESYREL) 50 MG tablet Take 50 mg by mouth daily.   zinc gluconate 50 MG tablet Take 1 tablet (50 mg total) by mouth daily for 7 days.   No facility-administered encounter medications  on file as of 06/30/2023.    Review of Systems:  Review of Systems  Constitutional:  Positive for activity change. Negative for appetite change.  HENT: Negative.    Respiratory:  Positive for cough. Negative for shortness of breath.   Cardiovascular:  Negative for leg swelling.  Gastrointestinal:  Positive for constipation and diarrhea.  Genitourinary: Negative.   Musculoskeletal:  Positive for arthralgias and gait problem. Negative for myalgias.  Skin: Negative.   Neurological:  Negative for dizziness and weakness.  Psychiatric/Behavioral:  Positive for confusion. Negative for dysphoric mood and sleep disturbance.     Health Maintenance  Topic Date Due   Pneumonia Vaccine 75+ Years old (1 of 2 - PCV) Never done   Zoster Vaccines- Shingrix (1 of 2) Never done   INFLUENZA VACCINE  Never done   Medicare Annual Wellness (AWV)  06/26/2023   DTaP/Tdap/Td (3 - Td or Tdap) 06/14/2025   DEXA SCAN  Completed   HPV VACCINES  Aged Out   COVID-19 Vaccine  Discontinued    Physical Exam: There were no vitals filed for this visit. There is no height or weight on file to calculate BMI. Physical Exam Vitals reviewed.  Constitutional:      Appearance: Normal appearance.  HENT:     Head: Normocephalic.     Nose: Nose normal.     Mouth/Throat:     Mouth: Mucous membranes are moist.     Pharynx: Oropharynx is clear.  Eyes:     Pupils: Pupils are equal, round, and reactive to light.  Cardiovascular:     Rate and Rhythm: Normal rate and regular rhythm.     Pulses: Normal pulses.     Heart sounds: Murmur heard.  Pulmonary:     Effort: Pulmonary effort is normal.     Breath sounds: Normal breath sounds.  Abdominal:     General: Abdomen is flat.  Bowel sounds are normal.     Palpations: Abdomen is soft.  Musculoskeletal:        General: No swelling.     Cervical back: Neck supple.     Comments: Did have some swelling and tenderness in her left ankle  Skin:    General: Skin is warm.  Neurological:     General: No focal deficit present.     Mental Status: She is alert and oriented to person, place, and time.     Comments: Patient's speech was clear she was able to name the objects clearly able to follow the commands.  Was able to walk with her walker  Psychiatric:        Mood and Affect: Mood normal.        Thought Content: Thought content normal.     Labs reviewed: Basic Metabolic Panel: Recent Labs    11/20/22 0000 11/23/22 0000  NA 142  --   K 3.5  --   CL 103  --   CO2 28*  --   BUN 14  --   CREATININE 0.8  --   CALCIUM 8.4*  --   TSH  --  0.80   Liver Function Tests: Recent Labs    11/20/22 0000  AST 13  ALT 7  ALKPHOS 52  ALBUMIN 3.4*   No results for input(s): "LIPASE", "AMYLASE" in the last 8760 hours. No results for input(s): "AMMONIA" in the last 8760 hours. CBC: Recent Labs    11/20/22 0000  WBC 6.6  NEUTROABS 3,703.00  HGB 13.5  HCT 40  PLT  215   Lipid Panel: Recent Labs    11/20/22 0000  CHOL 105  HDL 39  LDLCALC 47  TRIG 100   Lab Results  Component Value Date   HGBA1C 6.7 11/23/2022    Procedures since last visit: No results found.  Assessment/Plan 1. Slurred speech (Primary) Patient had episode over the weekend lasting almost 48 hours of slurred speech. She is on Plavix and statin Her symptoms have resolved She at that time also had some cough and fever low-grade Not sure if it was related to the viral infection We discussed in detail with the patient.  Initially she said she does not want to go to the hospital if this happens again but then she has agreed I have written an order to send her to the hospital if she has another episode of slurred speech Patient's  daughter has made an appointment with neurology  2. H/O: stroke On Plavix and statin  3. Fall, sequela Is doing better with her walking  4. Acute left ankle pain Her x-ray was negative If pain persist will repeat the x-ray again  5. Subacute cough The nurses had noticed some wheezing We will try albuterol nebs at night for 3 days  Patient latter on also c/o Dysuria Will check UA   Labs/tests ordered:  CBC,CMP  Next appt:  Visit date not found

## 2023-07-01 NOTE — Progress Notes (Unsigned)
[facility] [address] [place]. [place] [address] [phone]       OFFICE FOLLOW UP NOTE  [name] Date of Birth:  1930/04/03 Medical Record Number:  [id number]    Reason for visit: New strokelike symptoms    SUBJECTIVE:   CHIEF COMPLAINT:  No chief complaint on file.   HPI:   Update 07/04/2023 JM: Patient is being seen per request due to recent strokelike symptoms.  She was previously seen over 1 year ago for stroke follow-up and doing well with residual aphasia and mild cognitive difficulties but gradually improving.  She was advised to follow-up as needed.  Continues to reside at [facility] assisted living.  She was found on the floor on 1/13 during routine nursing checks, she was complaining of ankle pain.  Nursing reported increased nasal congestion and fever x3 days, COVID neg, flu ***.  Daughter reports the following day she noticed slurred speech, facial weakness, worsening cognition and worsening conversational acuity.    She has been compliant on Plavix and atorvastatin.  Prior lab work in 11/2022 showed LDL 47 and A1c 6.7.  Does have history of OSA, CPAP ***.      History provided for reference purposes only Initial visit 05/19/2022 JM: Patient is being seen for initial hospital follow-up unaccompanied.  She has been working with speech therapy at [facility] for residual aphasia and cognitive difficulties.  Does report some improvement since discharge.  Denies any physical deficits.  Ambulates with rolling walker at baseline, denies any recent falls.  Remains on both aspirin and Plavix as well as atorvastatin Blood pressure well controlled Heart monitor - will be completed Dec 11th She has since had follow-up with PCP  Hospitalization 04/16/2022 Autumn Allen is a 89 y.o. female with history of mild aortic stenosis, hypertension, hyperlipidemia, obstructive sleep apnea on CPAP, prior history of TIA 2016 without  residual deficits who presented to the emergency room on 04/16/2022 for episode of confusion and word finding difficulty.  Evaluated by Dr. Pearlean Brownie for acute left MCA ischemic infarcts of cryptogenic etiology.  CTA head/neck showed acute left M2 occlusion. EF 70 to 75%.  LDL 115.  A1c 6.1.  Recommended DAPT for 3 weeks then Plavix alone and increased home dose atorvastatin from 20 mg to 40 mg daily.  Recommended outpatient cardiac monitor to evaluate for A-fib.  Discharged home in stable condition without therapy needs.         PERTINENT IMAGING  CT head 1. No acute intracranial abnormality. 2. Unchanged age related atrophy and mild chronic small vessel ischemia. CTA head & neck acute left M2 occlusion MRI  1. Scattered patchy acute ischemic nonhemorrhagic left MCA distribution infarcts involving the left insula and adjacent left temporal region. No associated hemorrhage or significant mass effect.2. Underlying mild chronic microvascular ischemic disease 2D Echo EF 70-75%. left ventricle has hyperdynamic function. mild left ventricular hypertrophy. Left ventricular diastolic parameters are consistent with Grade I diastolic dysfunction    LDL 115 HgbA1c 6.1    ROS:   14 system review of systems performed and negative with exception of those listed in HPI  PMH:  Past Medical History:  Diagnosis Date   Allergy    Aortic stenosis    a. mild to mod by Echo 07/2012   Arthritis    Asymptomatic varicose veins    Benign paroxysmal vertigo    Bradycardia, unspecified    Bronchitis, not specified as acute or chronic  Cardiac murmur    Cataracts, bilateral    Chest pain, unspecified    Chronic cystitis    Depression    Diarrhea    Dyspnea    Dysuria    Frequency of micturition    Frequent urination    Gait disturbance 06/10/2014   Gastric ulcer    Hearing loss    Hemorrhoids    History of bone density study    History of colonoscopy    History of frequent urinary tract infections     History of urinary tract infection    Hx of cardiovascular stress test    a. ETT-Echo 3/12:  EF 60%, normal study   Hx of echocardiogram    a. Echo 2/14:  Mild LVH, EF 60-65%, Gr 1 diast dysfn, mild to mod AS, mean 17 mmHg, AVA 1.3 (VTI), trivial MR, mild LAE, PASP 31    Hyperlipemia    Hypertension    Hypothyroid    Left knee pain    Leg pain    Low back pain    Obstructive sleep apnea    OSA on CPAP    Other disorders of intestinal carbohydrate absorption    Other type of osteoarthritis, unspecified site    Overweight    Pain in thoracic spine    Palpitations    a. event monitor 3/14:  NSR, sinus brady   Plantar fasciitis    Plantar fasciitis    Right shoulder pain    S/P cholecystectomy    S/P TAH-BSO    Sleep apnea    Stroke (HCC)    tia 2016   TIA (transient ischemic attack)    Varicose veins of unspecified lower extremity with inflammation     PSH:  Past Surgical History:  Procedure Laterality Date   ABDOMINAL HYSTERECTOMY     CHOLECYSTECTOMY     EP IMPLANTABLE DEVICE N/A 06/29/2016   Procedure: Loop Recorder Insertion;  Surgeon: Will Jorja Loa, MD;  Location: [facility] INVASIVE CV LAB;  Service: Cardiovascular;  Laterality: N/A;   FOOT SURGERY  1955   INCONTINENCE SURGERY     LOOP RECORDER REMOVAL N/A 08/10/2017   Procedure: LOOP RECORDER REMOVAL;  Surgeon: Regan Lemming, MD;  Location: [facility] INVASIVE CV LAB;  Service: Cardiovascular;  Laterality: N/A;   TONSILLECTOMY      Social History:  Social History   Socioeconomic History   Marital status: Widowed    Spouse name: Not on file   Number of children: 2   Years of education: college gr   Highest education level: Not on file  Occupational History   Occupation: retired Runner, broadcasting/film/video  Tobacco Use   Smoking status: Former    Types: Cigarettes   Smokeless tobacco: Never  Vaping Use   Vaping status: Never Used  Substance and Sexual Activity   Alcohol use: Yes    Alcohol/week: 1.0 standard drink of  alcohol    Types: 1 Glasses of wine per week    Comment: wine sometimes   Drug use: No   Sexual activity: Not on file  Other Topics Concern   Not on file  Social History Narrative   Diet:      Caffeine:  2-3 cups of caffeine daily- decaf coffee.      Married, if yes what year: widow. Married 1955       Do you live in a house, apartment, assisted living, condo, trailer, ect: [facility], independent living.   Is it one or more stories: one  How many persons live in your home? 1      Pets:cno      Highest level or education completed: college degree      Current/Past profession: retired Engineer, site      Exercise:  yes                Type and how often: daily         Living Will: Yes   DNR: Yes   POA/HPOA: Yes      Functional Status:   Do you have difficulty bathing or dressing yourself? No   Do you have difficulty preparing food or eating? No   Do you have difficulty managing your medications? No   Do you have difficulty managing your finances? No   Do you have difficulty affording your medications? No      Patient is right handed.    Social Drivers of Corporate investment banker Strain: Low Risk  (06/25/2022)   Overall Financial Resource Strain (CARDIA)    Difficulty of Paying Living Expenses: Not hard at all  Food Insecurity: No Food Insecurity (06/25/2022)   Hunger Vital Sign    Worried About Running Out of Food in the Last Year: Never true    Ran Out of Food in the Last Year: Never true  Transportation Needs: No Transportation Needs (06/25/2022)   PRAPARE - Administrator, Civil Service (Medical): No    Lack of Transportation (Non-Medical): No  Physical Activity: Insufficiently Active (06/25/2022)   Exercise Vital Sign    Days of Exercise per Week: 3 days    Minutes of Exercise per Session: 40 min  Stress: No Stress Concern Present (06/25/2022)   [employer] of Occupational Health - Occupational Stress Questionnaire    Feeling of  Stress : Not at all  Social Connections: Socially Isolated (06/25/2022)   Social Connection and Isolation Panel [NHANES]    Frequency of Communication with Friends and Family: Once a week    Frequency of Social Gatherings with Friends and Family: More than three times a week    Attends Religious Services: Never    Database administrator or Organizations: No    Attends Banker Meetings: Never    Marital Status: Widowed  Intimate Partner Violence: Not At Risk (06/25/2022)   Humiliation, Afraid, Rape, and Kick questionnaire    Fear of Current or Ex-Partner: No    Emotionally Abused: No    Physically Abused: No    Sexually Abused: No    Family History:  Family History  Problem Relation Age of Onset   Heart attack Mother 77   Heart failure Mother    Arthritis Mother    Lung disease Father    Cancer - Prostate Father    Healthy Brother    Healthy Brother    Heart failure Maternal Uncle    Cancer Son     Medications:   Current Outpatient Medications on File Prior to Visit  Medication Sig Dispense Refill   acetaminophen (TYLENOL) 500 MG tablet Take 1 tablet (500 mg total) by mouth every 8 (eight) hours as needed for moderate pain. 30 tablet 0   Ascorbic Acid (VITAMIN C) 1000 MG tablet Take 1 tablet (1,000 mg total) by mouth daily for 7 days.     atorvastatin (LIPITOR) 40 MG tablet Take 1 tablet (40 mg total) by mouth daily. 30 tablet 1   cetirizine (ZYRTEC) 10 MG tablet Take 1 tablet (10 mg total)  by mouth at bedtime for 10 days.     clopidogrel (PLAVIX) 75 MG tablet Take 1 tablet (75 mg total) by mouth daily. 30 tablet 3   escitalopram (LEXAPRO) 10 MG tablet Take 15 mg by mouth at bedtime.     felodipine (PLENDIL) 5 MG 24 hr tablet Take 5 mg by mouth daily.     gabapentin (NEURONTIN) 100 MG capsule Take 100 mg by mouth at bedtime as needed.     GUAIFENESIN PO Take 10 mLs by mouth every 6 (six) hours as needed.     indapamide (LOZOL) 2.5 MG tablet Take 2.5 mg by mouth  daily.     KETOCONAZOLE, TOPICAL, 1 % SHAM Apply 1 Application topically 2 (two) times a week. Tuesday & Friday     Lactobacillus-Inulin (CULTURELLE DIGESTIVE HEALTH PO) Take 500 mg by mouth as needed (for loose stool or antibiotic use twice daily as needed.).     levothyroxine (SYNTHROID, LEVOTHROID) 50 MCG tablet Take 50 mcg by mouth in the morning.     melatonin 5 MG TABS Take 5 mg by mouth daily.     polyethylene glycol (MIRALAX / GLYCOLAX) 17 g packet Take 17 g by mouth daily.     traZODone (DESYREL) 50 MG tablet Take 50 mg by mouth daily.     zinc gluconate 50 MG tablet Take 1 tablet (50 mg total) by mouth daily for 7 days.     No current facility-administered medications on file prior to visit.    Allergies:   Allergies  Allergen Reactions   Bee Venom Other (See Comments)    Unknown reaction Per facility    Cipro [Ciprofloxacin Hcl] Other (See Comments)    Unknown reaction  Per facility   Crestor [Rosuvastatin] Other (See Comments)    Myalgias    Lodine [Etodolac] Other (See Comments)    Unknown reaction Per facility   Naprosyn [Naproxen] Other (See Comments)    Unknown reaction Per facility   Sulfonamide Derivatives Other (See Comments)    Unknown reaction Per facility   Tositumomab    Epipen [Epinephrine] Palpitations   Macrobid [Nitrofurantoin Macrocrystal] Rash   Metrocream [Metronidazole] Rash   Xanax [Alprazolam] Rash      OBJECTIVE:  Physical Exam  There were no vitals filed for this visit.  There is no height or weight on file to calculate BMI. No results found.  Poststroke PHQ 2/9    11/24/2022    2:58 PM  Depression screen PHQ 2/9  Decreased Interest 1  Down, Depressed, Hopeless 1  PHQ - 2 Score 2  Altered sleeping 0  Tired, decreased energy 1  Change in appetite 0  Feeling bad or failure about yourself  0  Trouble concentrating 1  Moving slowly or fidgety/restless 1  Suicidal thoughts 0  PHQ-9 Score 5  Difficult doing work/chores Not  difficult at all     General: well developed, well nourished, pleasant elderly Caucasian female, seated, in no evident distress Head: head normocephalic and atraumatic.   Neck: supple with no carotid or supraclavicular bruits Cardiovascular: regular rate and rhythm, no murmurs Musculoskeletal: no deformity Skin:  no rash/petichiae Vascular:  Normal pulses all extremities   Neurologic Exam Mental Status: Awake and fully alert.  Mild expressive aphasia with speech hesitancy.  Oriented to place and time. Recent memory mildly impaired and remote memory intact. Attention span, concentration and fund of knowledge appropriate during visit. Mood and affect appropriate.  Cranial Nerves: Fundoscopic exam reveals sharp disc margins. Pupils  equal, briskly reactive to light. Extraocular movements full without nystagmus. Visual fields full to confrontation.  HOH bilaterally. Facial sensation intact. Face, tongue, palate moves normally and symmetrically.  Motor: Normal bulk and tone. Normal strength in all tested extremity muscles Sensory.: intact to touch , pinprick , position and vibratory sensation.  Coordination: Rapid alternating movements normal in all extremities. Finger-to-nose and heel-to-shin performed accurately bilaterally. Gait and Station: Arises from chair without difficulty. Stance is slightly hunched.  Gait demonstrates normal stride length and balance with use of rolling walker. Tandem walk and heel toe not attempted.  Reflexes: 1+ and symmetric. Toes downgoing.     NIHSS  1 Modified Rankin  2      ASSESSMENT: NANNA JOURNIGAN is a 89 y.o. year old female with hx of left MCA ischemic infarcts on 04/16/2022 of cryptogenic etiology. Vascular risk factors include HLD, history of TIA, CAD and OSA on CPAP. Patient suffered a fall on 06/27/2023 and started to experience slurred speech, facial weakness and cognitive worsening the following day.     PLAN:  New neurological  symptoms *** Cryptogenic left MCA stroke:  Residual deficit: Mild expressive aphasia and cognitive impairment.  Continue working with SLP at [facility] for hopeful ongoing recovery.  Complete cardiac monitor around 12/11 to rule out A-fib as potential stroke etiology Continue Plavix and atorvastatin (Lipitor) for secondary stroke prevention.  Advised to discontinue aspirin 3 weeks DAPT completed Discussed secondary stroke prevention measures and importance of close PCP follow up for aggressive stroke risk factor management including BP goal<130/90, HLD with LDL goal<70 and DM with A1c.<7 .  Stroke labs 04/2022: LDL 115, A1c 6.1 - request repeat levels with PCP in the next 2-3 months I have gone over the pathophysiology of stroke, warning signs and symptoms, risk factors and their management in some detail with instructions to go to the closest emergency room for symptoms of concern.    Overall stable from stroke standpoint without further recommendations and risk factors are managed by PCP. She may follow up PRN, as usual for our patients who are strictly being followed for stroke. If any new neurological issues should arise, request PCP place referral for evaluation by one of our neurologists. Thank you.     CC:  [facility] provider: Dr. Pearlean Brownie PCP: Mahlon Gammon, MD    I spent 57 minutes of face-to-face and non-face-to-face time with patient.  This included previsit chart review including review of recent hospitalization, lab review, study review, order entry, electronic health record documentation, patient education regarding recent stroke including etiology, secondary stroke prevention measures and importance of managing stroke risk factors, residual deficits and typical recovery time and answered all other questions to patient satisfaction   Ihor Austin, AGNP-BC  [facility] [address] [place], [place] [address]  Phone [phone] Fax  [phone] Note: This document was prepared with digital dictation and possible smart phrase technology. Any transcriptional errors that result from this process are unintentional.

## 2023-07-03 ENCOUNTER — Encounter: Payer: Self-pay | Admitting: Internal Medicine

## 2023-07-03 ENCOUNTER — Other Ambulatory Visit: Payer: Self-pay | Admitting: Adult Health

## 2023-07-03 DIAGNOSIS — N390 Urinary tract infection, site not specified: Secondary | ICD-10-CM

## 2023-07-03 DIAGNOSIS — R2681 Unsteadiness on feet: Secondary | ICD-10-CM | POA: Diagnosis not present

## 2023-07-03 DIAGNOSIS — R41841 Cognitive communication deficit: Secondary | ICD-10-CM | POA: Diagnosis not present

## 2023-07-03 DIAGNOSIS — M6281 Muscle weakness (generalized): Secondary | ICD-10-CM | POA: Diagnosis not present

## 2023-07-03 DIAGNOSIS — R278 Other lack of coordination: Secondary | ICD-10-CM | POA: Diagnosis not present

## 2023-07-03 DIAGNOSIS — S93402A Sprain of unspecified ligament of left ankle, initial encounter: Secondary | ICD-10-CM | POA: Diagnosis not present

## 2023-07-03 DIAGNOSIS — Z9181 History of falling: Secondary | ICD-10-CM | POA: Diagnosis not present

## 2023-07-03 MED ORDER — CEFTRIAXONE SODIUM 1 G IJ SOLR: 1.0000 g | INTRAMUSCULAR | 0 refills | Status: AC

## 2023-07-04 ENCOUNTER — Ambulatory Visit: Payer: Medicare PPO | Admitting: Adult Health

## 2023-07-04 ENCOUNTER — Telehealth: Payer: Self-pay | Admitting: Adult Health

## 2023-07-04 ENCOUNTER — Encounter: Payer: Self-pay | Admitting: Adult Health

## 2023-07-04 VITALS — BP 159/54 | HR 44 | Ht 65.0 in | Wt 174.0 lb

## 2023-07-04 DIAGNOSIS — G4730 Sleep apnea, unspecified: Secondary | ICD-10-CM | POA: Diagnosis not present

## 2023-07-04 DIAGNOSIS — M6281 Muscle weakness (generalized): Secondary | ICD-10-CM | POA: Diagnosis not present

## 2023-07-04 DIAGNOSIS — D649 Anemia, unspecified: Secondary | ICD-10-CM | POA: Diagnosis not present

## 2023-07-04 DIAGNOSIS — R278 Other lack of coordination: Secondary | ICD-10-CM | POA: Diagnosis not present

## 2023-07-04 DIAGNOSIS — F801 Expressive language disorder: Secondary | ICD-10-CM | POA: Diagnosis not present

## 2023-07-04 DIAGNOSIS — I63412 Cerebral infarction due to embolism of left middle cerebral artery: Secondary | ICD-10-CM | POA: Diagnosis not present

## 2023-07-04 DIAGNOSIS — R2681 Unsteadiness on feet: Secondary | ICD-10-CM | POA: Diagnosis not present

## 2023-07-04 DIAGNOSIS — R2990 Unspecified symptoms and signs involving the nervous system: Secondary | ICD-10-CM

## 2023-07-04 DIAGNOSIS — R4189 Other symptoms and signs involving cognitive functions and awareness: Secondary | ICD-10-CM

## 2023-07-04 DIAGNOSIS — R41841 Cognitive communication deficit: Secondary | ICD-10-CM | POA: Diagnosis not present

## 2023-07-04 DIAGNOSIS — Z789 Other specified health status: Secondary | ICD-10-CM | POA: Diagnosis not present

## 2023-07-04 DIAGNOSIS — I69928 Other speech and language deficits following unspecified cerebrovascular disease: Secondary | ICD-10-CM | POA: Diagnosis not present

## 2023-07-04 DIAGNOSIS — Z9181 History of falling: Secondary | ICD-10-CM | POA: Diagnosis not present

## 2023-07-04 DIAGNOSIS — S93402A Sprain of unspecified ligament of left ankle, initial encounter: Secondary | ICD-10-CM | POA: Diagnosis not present

## 2023-07-04 LAB — COMPREHENSIVE METABOLIC PANEL WITH GFR
Albumin: 3.7 (ref 3.5–5.0)
Calcium: 8.6 — AB (ref 8.7–10.7)
Globulin: 2.3
eGFR: 56

## 2023-07-04 LAB — BASIC METABOLIC PANEL WITH GFR
BUN: 27 — AB (ref 4–21)
CO2: 30 — AB (ref 13–22)
Chloride: 104 (ref 99–108)
Creatinine: 1 (ref 0.5–1.1)
Glucose: 93
Potassium: 5 meq/L (ref 3.5–5.1)
Sodium: 143 (ref 137–147)

## 2023-07-04 LAB — HEPATIC FUNCTION PANEL
ALT: 12 U/L (ref 7–35)
AST: 19 (ref 13–35)
Alkaline Phosphatase: 53 (ref 25–125)
Bilirubin, Total: 0.3

## 2023-07-04 LAB — CBC AND DIFFERENTIAL
HCT: 40 (ref 36–46)
Hemoglobin: 13.2 (ref 12.0–16.0)
Platelets: 201 10*3/uL (ref 150–400)
WBC: 6.1

## 2023-07-04 LAB — CBC: RBC: 4.05 (ref 3.87–5.11)

## 2023-07-04 MED ORDER — MEMANTINE HCL 5 MG PO TABS: 5.0000 mg | ORAL_TABLET | Freq: Two times a day (BID) | ORAL | 5 refills | Status: DC

## 2023-07-04 NOTE — Telephone Encounter (Signed)
Appointment needed to be r/s according to daughter

## 2023-07-04 NOTE — Patient Instructions (Addendum)
Your Plan:  Would recommend trying to encourage nightly use of CPAP for sleep apnea management as untreated sleep apnea can contribute to further memory decline and increase risk of strokes  Referral will be placed to your dentist for evaluation of oral appliance for apnea management  Start Namenda 5mg  daily for 1 week then increase to 5mg  twice daily, can consider further increasing after a couple weeks if needed  Can hold off on repeat imaging to look for a new stroke at this time as our current treatment plan would not change. Please let me know if you change your mind and would like to pursue   Continue Plavix and atorvastatin for secondary stroke prevention   Continue to follow with Dr. Chales Abrahams for stroke prevention management       Follow up in 6 months or call earlier if needed      Thank you for coming to see [place] at [facility]. I hope we have been able to provide you high quality care today.  You may receive a patient satisfaction survey over the next few weeks. We would appreciate your feedback and comments so that we may continue to improve ourselves and the health of our patients.     Memantine Tablets What is this medication? MEMANTINE (MEM an teen) treats memory loss and confusion (dementia) in people who have Alzheimer disease. It works by improving attention, memory, and the ability to engage in daily activities. It is not a cure for dementia or Alzheimer disease. This medicine may be used for other purposes; ask your health care provider or pharmacist if you have questions. COMMON BRAND NAME(S): Namenda What should I tell my care team before I take this medication? They need to know if you have any of these conditions: Kidney disease Liver disease Seizures Trouble passing urine An unusual or allergic reaction to memantine, other medications, foods, dyes, or preservatives Pregnant or trying to get pregnant Breast-feeding How should I use  this medication? Take this medication by mouth with water. Follow the directions on the prescription label. You may take this medication with or without food. Take your doses at regular intervals. Do not take your medication more often than directed. Continue to take your medication even if you feel better. Do not stop taking except on the advice of your care team. Talk to your care team about the use of this medication in children. Special care may be needed Overdosage: If you think you have taken too much of this medicine contact a poison control center or emergency room at once. NOTE: This medicine is only for you. Do not share this medicine with others. What if I miss a dose? If you miss a dose, take it as soon as you can. If it is almost time for your next dose, take only that dose. Do not take double or extra doses. If you do not take your medication for several days, contact your care team. Your dose may need to be changed. What may interact with this medication? Acetazolamide Amantadine Cimetidine Dextromethorphan Dofetilide Hydrochlorothiazide Ketamine Metformin Methazolamide Quinidine Ranitidine Sodium bicarbonate Triamterene This list may not describe all possible interactions. Give your health care provider a list of all the medicines, herbs, non-prescription drugs, or dietary supplements you use. Also tell them if you smoke, drink alcohol, or use illegal drugs. Some items may interact with your medicine. What should I watch for while using this medication? Visit your care team for regular checks on your progress. Check  with your care team if there is no improvement in your symptoms or if they get worse. This medication may affect your coordination, reaction time, or judgment. Do not drive or operate machinery until you know how this medication affects you. Sit up or stand slowly to reduce the risk of dizzy or fainting spells. Drinking alcohol with this medication can increase the  risk of these side effects. What side effects may I notice from receiving this medication? Side effects that you should report to your care team as soon as possible: Allergic reactions--skin rash, itching, hives, swelling of the face, lips, tongue, or throat Side effects that usually do not require medical attention (report to your care team if they continue or are bothersome): Confusion Constipation Diarrhea Dizziness Headache This list may not describe all possible side effects. Call your doctor for medical advice about side effects. You may report side effects to FDA at 1-800-FDA-1088. Where should I keep my medication? Keep out of the reach of children. Store at room temperature between 15 degrees and 30 degrees C (59 degrees and 86 degrees F). Throw away any unused medication after the expiration date. NOTE: This sheet is a summary. It may not cover all possible information. If you have questions about this medicine, talk to your doctor, pharmacist, or health care provider.  2024 Elsevier/Gold Standard (2021-06-23 00:00:00)

## 2023-07-04 NOTE — Telephone Encounter (Signed)
Referral for dentistry fax to [facility]. Phone:: [phone], Fax: [phone]

## 2023-07-05 DIAGNOSIS — R2681 Unsteadiness on feet: Secondary | ICD-10-CM | POA: Diagnosis not present

## 2023-07-05 DIAGNOSIS — R278 Other lack of coordination: Secondary | ICD-10-CM | POA: Diagnosis not present

## 2023-07-05 DIAGNOSIS — M6281 Muscle weakness (generalized): Secondary | ICD-10-CM | POA: Diagnosis not present

## 2023-07-05 DIAGNOSIS — Z9181 History of falling: Secondary | ICD-10-CM | POA: Diagnosis not present

## 2023-07-05 DIAGNOSIS — S93402A Sprain of unspecified ligament of left ankle, initial encounter: Secondary | ICD-10-CM | POA: Diagnosis not present

## 2023-07-05 DIAGNOSIS — R41841 Cognitive communication deficit: Secondary | ICD-10-CM | POA: Diagnosis not present

## 2023-07-06 DIAGNOSIS — R2681 Unsteadiness on feet: Secondary | ICD-10-CM | POA: Diagnosis not present

## 2023-07-06 DIAGNOSIS — R41841 Cognitive communication deficit: Secondary | ICD-10-CM | POA: Diagnosis not present

## 2023-07-06 DIAGNOSIS — Z9181 History of falling: Secondary | ICD-10-CM | POA: Diagnosis not present

## 2023-07-06 DIAGNOSIS — S93402A Sprain of unspecified ligament of left ankle, initial encounter: Secondary | ICD-10-CM | POA: Diagnosis not present

## 2023-07-06 DIAGNOSIS — R278 Other lack of coordination: Secondary | ICD-10-CM | POA: Diagnosis not present

## 2023-07-06 DIAGNOSIS — M6281 Muscle weakness (generalized): Secondary | ICD-10-CM | POA: Diagnosis not present

## 2023-07-07 DIAGNOSIS — M6281 Muscle weakness (generalized): Secondary | ICD-10-CM | POA: Diagnosis not present

## 2023-07-07 DIAGNOSIS — R41841 Cognitive communication deficit: Secondary | ICD-10-CM | POA: Diagnosis not present

## 2023-07-07 DIAGNOSIS — S93402A Sprain of unspecified ligament of left ankle, initial encounter: Secondary | ICD-10-CM | POA: Diagnosis not present

## 2023-07-07 DIAGNOSIS — Z9181 History of falling: Secondary | ICD-10-CM | POA: Diagnosis not present

## 2023-07-07 DIAGNOSIS — R278 Other lack of coordination: Secondary | ICD-10-CM | POA: Diagnosis not present

## 2023-07-07 DIAGNOSIS — R2681 Unsteadiness on feet: Secondary | ICD-10-CM | POA: Diagnosis not present

## 2023-07-11 DIAGNOSIS — R278 Other lack of coordination: Secondary | ICD-10-CM | POA: Diagnosis not present

## 2023-07-11 DIAGNOSIS — Z9181 History of falling: Secondary | ICD-10-CM | POA: Diagnosis not present

## 2023-07-11 DIAGNOSIS — R41841 Cognitive communication deficit: Secondary | ICD-10-CM | POA: Diagnosis not present

## 2023-07-11 DIAGNOSIS — R2681 Unsteadiness on feet: Secondary | ICD-10-CM | POA: Diagnosis not present

## 2023-07-11 DIAGNOSIS — S93402A Sprain of unspecified ligament of left ankle, initial encounter: Secondary | ICD-10-CM | POA: Diagnosis not present

## 2023-07-11 DIAGNOSIS — M6281 Muscle weakness (generalized): Secondary | ICD-10-CM | POA: Diagnosis not present

## 2023-07-12 DIAGNOSIS — R2681 Unsteadiness on feet: Secondary | ICD-10-CM | POA: Diagnosis not present

## 2023-07-12 DIAGNOSIS — M6281 Muscle weakness (generalized): Secondary | ICD-10-CM | POA: Diagnosis not present

## 2023-07-12 DIAGNOSIS — R278 Other lack of coordination: Secondary | ICD-10-CM | POA: Diagnosis not present

## 2023-07-12 DIAGNOSIS — I69928 Other speech and language deficits following unspecified cerebrovascular disease: Secondary | ICD-10-CM | POA: Diagnosis not present

## 2023-07-12 DIAGNOSIS — F801 Expressive language disorder: Secondary | ICD-10-CM | POA: Diagnosis not present

## 2023-07-12 DIAGNOSIS — R41841 Cognitive communication deficit: Secondary | ICD-10-CM | POA: Diagnosis not present

## 2023-07-12 DIAGNOSIS — S93402A Sprain of unspecified ligament of left ankle, initial encounter: Secondary | ICD-10-CM | POA: Diagnosis not present

## 2023-07-12 DIAGNOSIS — Z9181 History of falling: Secondary | ICD-10-CM | POA: Diagnosis not present

## 2023-07-13 DIAGNOSIS — M6281 Muscle weakness (generalized): Secondary | ICD-10-CM | POA: Diagnosis not present

## 2023-07-13 DIAGNOSIS — R278 Other lack of coordination: Secondary | ICD-10-CM | POA: Diagnosis not present

## 2023-07-13 DIAGNOSIS — Z9181 History of falling: Secondary | ICD-10-CM | POA: Diagnosis not present

## 2023-07-13 DIAGNOSIS — R41841 Cognitive communication deficit: Secondary | ICD-10-CM | POA: Diagnosis not present

## 2023-07-13 DIAGNOSIS — R2681 Unsteadiness on feet: Secondary | ICD-10-CM | POA: Diagnosis not present

## 2023-07-13 DIAGNOSIS — S93402A Sprain of unspecified ligament of left ankle, initial encounter: Secondary | ICD-10-CM | POA: Diagnosis not present

## 2023-07-14 DIAGNOSIS — S93402A Sprain of unspecified ligament of left ankle, initial encounter: Secondary | ICD-10-CM | POA: Diagnosis not present

## 2023-07-14 DIAGNOSIS — Z9181 History of falling: Secondary | ICD-10-CM | POA: Diagnosis not present

## 2023-07-14 DIAGNOSIS — R41841 Cognitive communication deficit: Secondary | ICD-10-CM | POA: Diagnosis not present

## 2023-07-14 DIAGNOSIS — M6281 Muscle weakness (generalized): Secondary | ICD-10-CM | POA: Diagnosis not present

## 2023-07-14 DIAGNOSIS — R278 Other lack of coordination: Secondary | ICD-10-CM | POA: Diagnosis not present

## 2023-07-14 DIAGNOSIS — R2681 Unsteadiness on feet: Secondary | ICD-10-CM | POA: Diagnosis not present

## 2023-07-15 DIAGNOSIS — M6281 Muscle weakness (generalized): Secondary | ICD-10-CM | POA: Diagnosis not present

## 2023-07-15 DIAGNOSIS — F801 Expressive language disorder: Secondary | ICD-10-CM | POA: Diagnosis not present

## 2023-07-15 DIAGNOSIS — R278 Other lack of coordination: Secondary | ICD-10-CM | POA: Diagnosis not present

## 2023-07-15 DIAGNOSIS — R41841 Cognitive communication deficit: Secondary | ICD-10-CM | POA: Diagnosis not present

## 2023-07-15 DIAGNOSIS — I69928 Other speech and language deficits following unspecified cerebrovascular disease: Secondary | ICD-10-CM | POA: Diagnosis not present

## 2023-07-15 DIAGNOSIS — Z9181 History of falling: Secondary | ICD-10-CM | POA: Diagnosis not present

## 2023-07-18 DIAGNOSIS — S93402A Sprain of unspecified ligament of left ankle, initial encounter: Secondary | ICD-10-CM | POA: Diagnosis not present

## 2023-07-18 DIAGNOSIS — R278 Other lack of coordination: Secondary | ICD-10-CM | POA: Diagnosis not present

## 2023-07-18 DIAGNOSIS — R41841 Cognitive communication deficit: Secondary | ICD-10-CM | POA: Diagnosis not present

## 2023-07-18 DIAGNOSIS — M6281 Muscle weakness (generalized): Secondary | ICD-10-CM | POA: Diagnosis not present

## 2023-07-18 DIAGNOSIS — R2681 Unsteadiness on feet: Secondary | ICD-10-CM | POA: Diagnosis not present

## 2023-07-18 DIAGNOSIS — Z9181 History of falling: Secondary | ICD-10-CM | POA: Diagnosis not present

## 2023-07-19 DIAGNOSIS — S93402A Sprain of unspecified ligament of left ankle, initial encounter: Secondary | ICD-10-CM | POA: Diagnosis not present

## 2023-07-19 DIAGNOSIS — F801 Expressive language disorder: Secondary | ICD-10-CM | POA: Diagnosis not present

## 2023-07-19 DIAGNOSIS — R41841 Cognitive communication deficit: Secondary | ICD-10-CM | POA: Diagnosis not present

## 2023-07-19 DIAGNOSIS — M6281 Muscle weakness (generalized): Secondary | ICD-10-CM | POA: Diagnosis not present

## 2023-07-19 DIAGNOSIS — R2681 Unsteadiness on feet: Secondary | ICD-10-CM | POA: Diagnosis not present

## 2023-07-19 DIAGNOSIS — I69928 Other speech and language deficits following unspecified cerebrovascular disease: Secondary | ICD-10-CM | POA: Diagnosis not present

## 2023-07-19 DIAGNOSIS — Z9181 History of falling: Secondary | ICD-10-CM | POA: Diagnosis not present

## 2023-07-19 DIAGNOSIS — R278 Other lack of coordination: Secondary | ICD-10-CM | POA: Diagnosis not present

## 2023-07-20 DIAGNOSIS — M6281 Muscle weakness (generalized): Secondary | ICD-10-CM | POA: Diagnosis not present

## 2023-07-20 DIAGNOSIS — R2681 Unsteadiness on feet: Secondary | ICD-10-CM | POA: Diagnosis not present

## 2023-07-20 DIAGNOSIS — R41841 Cognitive communication deficit: Secondary | ICD-10-CM | POA: Diagnosis not present

## 2023-07-20 DIAGNOSIS — S93402A Sprain of unspecified ligament of left ankle, initial encounter: Secondary | ICD-10-CM | POA: Diagnosis not present

## 2023-07-20 DIAGNOSIS — Z9181 History of falling: Secondary | ICD-10-CM | POA: Diagnosis not present

## 2023-07-20 DIAGNOSIS — R278 Other lack of coordination: Secondary | ICD-10-CM | POA: Diagnosis not present

## 2023-07-21 ENCOUNTER — Encounter: Payer: Self-pay | Admitting: Adult Health

## 2023-07-21 DIAGNOSIS — S93402A Sprain of unspecified ligament of left ankle, initial encounter: Secondary | ICD-10-CM | POA: Diagnosis not present

## 2023-07-21 DIAGNOSIS — R41841 Cognitive communication deficit: Secondary | ICD-10-CM | POA: Diagnosis not present

## 2023-07-21 DIAGNOSIS — N390 Urinary tract infection, site not specified: Secondary | ICD-10-CM | POA: Diagnosis not present

## 2023-07-21 DIAGNOSIS — R2681 Unsteadiness on feet: Secondary | ICD-10-CM | POA: Diagnosis not present

## 2023-07-21 DIAGNOSIS — M6281 Muscle weakness (generalized): Secondary | ICD-10-CM | POA: Diagnosis not present

## 2023-07-21 DIAGNOSIS — R278 Other lack of coordination: Secondary | ICD-10-CM | POA: Diagnosis not present

## 2023-07-21 DIAGNOSIS — Z9181 History of falling: Secondary | ICD-10-CM | POA: Diagnosis not present

## 2023-07-22 DIAGNOSIS — R41841 Cognitive communication deficit: Secondary | ICD-10-CM | POA: Diagnosis not present

## 2023-07-22 DIAGNOSIS — I69928 Other speech and language deficits following unspecified cerebrovascular disease: Secondary | ICD-10-CM | POA: Diagnosis not present

## 2023-07-22 DIAGNOSIS — R278 Other lack of coordination: Secondary | ICD-10-CM | POA: Diagnosis not present

## 2023-07-22 DIAGNOSIS — M6281 Muscle weakness (generalized): Secondary | ICD-10-CM | POA: Diagnosis not present

## 2023-07-22 DIAGNOSIS — F801 Expressive language disorder: Secondary | ICD-10-CM | POA: Diagnosis not present

## 2023-07-22 DIAGNOSIS — Z9181 History of falling: Secondary | ICD-10-CM | POA: Diagnosis not present

## 2023-07-25 ENCOUNTER — Encounter: Payer: Self-pay | Admitting: *Deleted

## 2023-07-25 DIAGNOSIS — R41841 Cognitive communication deficit: Secondary | ICD-10-CM | POA: Diagnosis not present

## 2023-07-25 DIAGNOSIS — R2681 Unsteadiness on feet: Secondary | ICD-10-CM | POA: Diagnosis not present

## 2023-07-25 DIAGNOSIS — M6281 Muscle weakness (generalized): Secondary | ICD-10-CM | POA: Diagnosis not present

## 2023-07-25 DIAGNOSIS — R278 Other lack of coordination: Secondary | ICD-10-CM | POA: Diagnosis not present

## 2023-07-25 DIAGNOSIS — S93402A Sprain of unspecified ligament of left ankle, initial encounter: Secondary | ICD-10-CM | POA: Diagnosis not present

## 2023-07-25 DIAGNOSIS — Z9181 History of falling: Secondary | ICD-10-CM | POA: Diagnosis not present

## 2023-07-25 NOTE — Telephone Encounter (Signed)
 Spoke to  daughter ( checked DPOA) .  Daughter gave me email address to send to [email or web address] . Will write letter and email to daughter today Daughter thanked  me for calling .

## 2023-07-25 NOTE — Telephone Encounter (Signed)
 Can provide letter that she had poststroke cognitive impairment that would make it unsafe for patient to live alone but as far as her other conditions, PCP can provide this.

## 2023-07-26 DIAGNOSIS — I69928 Other speech and language deficits following unspecified cerebrovascular disease: Secondary | ICD-10-CM | POA: Diagnosis not present

## 2023-07-26 DIAGNOSIS — R41841 Cognitive communication deficit: Secondary | ICD-10-CM | POA: Diagnosis not present

## 2023-07-26 DIAGNOSIS — F801 Expressive language disorder: Secondary | ICD-10-CM | POA: Diagnosis not present

## 2023-07-26 DIAGNOSIS — R278 Other lack of coordination: Secondary | ICD-10-CM | POA: Diagnosis not present

## 2023-07-26 DIAGNOSIS — N390 Urinary tract infection, site not specified: Secondary | ICD-10-CM | POA: Diagnosis not present

## 2023-07-27 DIAGNOSIS — Z9181 History of falling: Secondary | ICD-10-CM | POA: Diagnosis not present

## 2023-07-27 DIAGNOSIS — R2681 Unsteadiness on feet: Secondary | ICD-10-CM | POA: Diagnosis not present

## 2023-07-27 DIAGNOSIS — S93402A Sprain of unspecified ligament of left ankle, initial encounter: Secondary | ICD-10-CM | POA: Diagnosis not present

## 2023-07-27 DIAGNOSIS — R278 Other lack of coordination: Secondary | ICD-10-CM | POA: Diagnosis not present

## 2023-07-27 DIAGNOSIS — M6281 Muscle weakness (generalized): Secondary | ICD-10-CM | POA: Diagnosis not present

## 2023-07-27 DIAGNOSIS — R41841 Cognitive communication deficit: Secondary | ICD-10-CM | POA: Diagnosis not present

## 2023-07-28 DIAGNOSIS — R2681 Unsteadiness on feet: Secondary | ICD-10-CM | POA: Diagnosis not present

## 2023-07-28 DIAGNOSIS — S93402A Sprain of unspecified ligament of left ankle, initial encounter: Secondary | ICD-10-CM | POA: Diagnosis not present

## 2023-07-28 DIAGNOSIS — R41841 Cognitive communication deficit: Secondary | ICD-10-CM | POA: Diagnosis not present

## 2023-07-28 DIAGNOSIS — R278 Other lack of coordination: Secondary | ICD-10-CM | POA: Diagnosis not present

## 2023-07-28 DIAGNOSIS — Z9181 History of falling: Secondary | ICD-10-CM | POA: Diagnosis not present

## 2023-07-28 DIAGNOSIS — I69928 Other speech and language deficits following unspecified cerebrovascular disease: Secondary | ICD-10-CM | POA: Diagnosis not present

## 2023-07-28 DIAGNOSIS — M6281 Muscle weakness (generalized): Secondary | ICD-10-CM | POA: Diagnosis not present

## 2023-07-28 DIAGNOSIS — F801 Expressive language disorder: Secondary | ICD-10-CM | POA: Diagnosis not present

## 2023-07-29 DIAGNOSIS — Z9181 History of falling: Secondary | ICD-10-CM | POA: Diagnosis not present

## 2023-07-29 DIAGNOSIS — M6281 Muscle weakness (generalized): Secondary | ICD-10-CM | POA: Diagnosis not present

## 2023-08-01 ENCOUNTER — Encounter: Payer: Self-pay | Admitting: Adult Health

## 2023-08-01 DIAGNOSIS — Z9181 History of falling: Secondary | ICD-10-CM | POA: Diagnosis not present

## 2023-08-01 DIAGNOSIS — R41841 Cognitive communication deficit: Secondary | ICD-10-CM | POA: Diagnosis not present

## 2023-08-01 DIAGNOSIS — R278 Other lack of coordination: Secondary | ICD-10-CM | POA: Diagnosis not present

## 2023-08-01 DIAGNOSIS — M6281 Muscle weakness (generalized): Secondary | ICD-10-CM | POA: Diagnosis not present

## 2023-08-01 DIAGNOSIS — R2681 Unsteadiness on feet: Secondary | ICD-10-CM | POA: Diagnosis not present

## 2023-08-01 DIAGNOSIS — S93402A Sprain of unspecified ligament of left ankle, initial encounter: Secondary | ICD-10-CM | POA: Diagnosis not present

## 2023-08-02 DIAGNOSIS — R278 Other lack of coordination: Secondary | ICD-10-CM | POA: Diagnosis not present

## 2023-08-02 DIAGNOSIS — R41841 Cognitive communication deficit: Secondary | ICD-10-CM | POA: Diagnosis not present

## 2023-08-02 DIAGNOSIS — I69928 Other speech and language deficits following unspecified cerebrovascular disease: Secondary | ICD-10-CM | POA: Diagnosis not present

## 2023-08-02 DIAGNOSIS — F801 Expressive language disorder: Secondary | ICD-10-CM | POA: Diagnosis not present

## 2023-08-03 DIAGNOSIS — M6281 Muscle weakness (generalized): Secondary | ICD-10-CM | POA: Diagnosis not present

## 2023-08-03 DIAGNOSIS — Z9181 History of falling: Secondary | ICD-10-CM | POA: Diagnosis not present

## 2023-08-03 DIAGNOSIS — R2681 Unsteadiness on feet: Secondary | ICD-10-CM | POA: Diagnosis not present

## 2023-08-03 DIAGNOSIS — R278 Other lack of coordination: Secondary | ICD-10-CM | POA: Diagnosis not present

## 2023-08-03 DIAGNOSIS — S93402A Sprain of unspecified ligament of left ankle, initial encounter: Secondary | ICD-10-CM | POA: Diagnosis not present

## 2023-08-03 DIAGNOSIS — R41841 Cognitive communication deficit: Secondary | ICD-10-CM | POA: Diagnosis not present

## 2023-08-04 DIAGNOSIS — S93402A Sprain of unspecified ligament of left ankle, initial encounter: Secondary | ICD-10-CM | POA: Diagnosis not present

## 2023-08-04 DIAGNOSIS — R41841 Cognitive communication deficit: Secondary | ICD-10-CM | POA: Diagnosis not present

## 2023-08-04 DIAGNOSIS — F801 Expressive language disorder: Secondary | ICD-10-CM | POA: Diagnosis not present

## 2023-08-04 DIAGNOSIS — R278 Other lack of coordination: Secondary | ICD-10-CM | POA: Diagnosis not present

## 2023-08-04 DIAGNOSIS — Z9181 History of falling: Secondary | ICD-10-CM | POA: Diagnosis not present

## 2023-08-04 DIAGNOSIS — M6281 Muscle weakness (generalized): Secondary | ICD-10-CM | POA: Diagnosis not present

## 2023-08-04 DIAGNOSIS — I69928 Other speech and language deficits following unspecified cerebrovascular disease: Secondary | ICD-10-CM | POA: Diagnosis not present

## 2023-08-04 DIAGNOSIS — R2681 Unsteadiness on feet: Secondary | ICD-10-CM | POA: Diagnosis not present

## 2023-08-05 DIAGNOSIS — Z9181 History of falling: Secondary | ICD-10-CM | POA: Diagnosis not present

## 2023-08-05 DIAGNOSIS — M6281 Muscle weakness (generalized): Secondary | ICD-10-CM | POA: Diagnosis not present

## 2023-08-08 DIAGNOSIS — S93402A Sprain of unspecified ligament of left ankle, initial encounter: Secondary | ICD-10-CM | POA: Diagnosis not present

## 2023-08-08 DIAGNOSIS — R41841 Cognitive communication deficit: Secondary | ICD-10-CM | POA: Diagnosis not present

## 2023-08-08 DIAGNOSIS — M6281 Muscle weakness (generalized): Secondary | ICD-10-CM | POA: Diagnosis not present

## 2023-08-08 DIAGNOSIS — Z9181 History of falling: Secondary | ICD-10-CM | POA: Diagnosis not present

## 2023-08-08 DIAGNOSIS — R278 Other lack of coordination: Secondary | ICD-10-CM | POA: Diagnosis not present

## 2023-08-08 DIAGNOSIS — R2681 Unsteadiness on feet: Secondary | ICD-10-CM | POA: Diagnosis not present

## 2023-08-10 DIAGNOSIS — R2681 Unsteadiness on feet: Secondary | ICD-10-CM | POA: Diagnosis not present

## 2023-08-10 DIAGNOSIS — R41841 Cognitive communication deficit: Secondary | ICD-10-CM | POA: Diagnosis not present

## 2023-08-10 DIAGNOSIS — R278 Other lack of coordination: Secondary | ICD-10-CM | POA: Diagnosis not present

## 2023-08-10 DIAGNOSIS — S93402A Sprain of unspecified ligament of left ankle, initial encounter: Secondary | ICD-10-CM | POA: Diagnosis not present

## 2023-08-10 DIAGNOSIS — Z9181 History of falling: Secondary | ICD-10-CM | POA: Diagnosis not present

## 2023-08-10 DIAGNOSIS — M6281 Muscle weakness (generalized): Secondary | ICD-10-CM | POA: Diagnosis not present

## 2023-08-11 ENCOUNTER — Non-Acute Institutional Stay: Payer: Self-pay | Admitting: Internal Medicine

## 2023-08-11 DIAGNOSIS — S93402A Sprain of unspecified ligament of left ankle, initial encounter: Secondary | ICD-10-CM | POA: Diagnosis not present

## 2023-08-11 DIAGNOSIS — K582 Mixed irritable bowel syndrome: Secondary | ICD-10-CM | POA: Diagnosis not present

## 2023-08-11 DIAGNOSIS — M6281 Muscle weakness (generalized): Secondary | ICD-10-CM | POA: Diagnosis not present

## 2023-08-11 DIAGNOSIS — Z9181 History of falling: Secondary | ICD-10-CM | POA: Diagnosis not present

## 2023-08-11 DIAGNOSIS — R41841 Cognitive communication deficit: Secondary | ICD-10-CM | POA: Diagnosis not present

## 2023-08-11 DIAGNOSIS — R2681 Unsteadiness on feet: Secondary | ICD-10-CM | POA: Diagnosis not present

## 2023-08-11 DIAGNOSIS — K644 Residual hemorrhoidal skin tags: Secondary | ICD-10-CM

## 2023-08-11 DIAGNOSIS — R278 Other lack of coordination: Secondary | ICD-10-CM | POA: Diagnosis not present

## 2023-08-12 ENCOUNTER — Encounter: Payer: Self-pay | Admitting: Internal Medicine

## 2023-08-12 DIAGNOSIS — R278 Other lack of coordination: Secondary | ICD-10-CM | POA: Diagnosis not present

## 2023-08-12 DIAGNOSIS — F801 Expressive language disorder: Secondary | ICD-10-CM | POA: Diagnosis not present

## 2023-08-12 DIAGNOSIS — R41841 Cognitive communication deficit: Secondary | ICD-10-CM | POA: Diagnosis not present

## 2023-08-12 DIAGNOSIS — I69928 Other speech and language deficits following unspecified cerebrovascular disease: Secondary | ICD-10-CM | POA: Diagnosis not present

## 2023-08-12 NOTE — Progress Notes (Signed)
 [facility] of Service:  ALF ([id number])  Provider:   Code Status: DNR Goals of Care:     06/27/2023   10:24 AM  Advanced Directives  Does Patient Have a Medical Advance Directive? Yes  Type of Estate agent of [place];Living will;Out of facility DNR (pink MOST or yellow form)  Does patient want to make changes to medical advance directive? No - Patient declined  Copy of Healthcare Power of Attorney in Chart? Yes - validated most recent copy scanned in chart (See row information)     Chief Complaint  Patient presents with   Acute Visit    HPI: Patient is a 89 y.o. female seen today for an acute visit for Rectal Bleeding Lives in AL in [facility]   Patient has a history of hypertension, hyperlipidemia, sleep apnea, hypothyroidism   h/o MCA stroke in 11/23 Moderate AS Diarrhea alternate with Constipation  She came to the nurse this morning and complained about severe constipation.  And then she noticed some blood when she wiped herself and have blood mixed with her stool.  She said I think is my hemorrhoids acting up Denies any abdominal pain nausea vomiting.  Denied any pain or discomfort in her rectal area  Past Medical History:  Diagnosis Date   Allergy    Aortic stenosis    a. mild to mod by Echo 07/2012   Arthritis    Asymptomatic varicose veins    Benign paroxysmal vertigo    Bradycardia, unspecified    Bronchitis, not specified as acute or chronic    Cardiac murmur    Cataracts, bilateral    Chest pain, unspecified    Chronic cystitis    Depression    Diarrhea    Dyspnea    Dysuria    Frequency of micturition    Frequent urination    Gait disturbance 06/10/2014   Gastric ulcer    Hearing loss    Hemorrhoids    History of bone density study    History of colonoscopy    History of frequent urinary tract infections    History of urinary tract infection    Hx of cardiovascular stress test    a. ETT-Echo 3/12:  EF 60%,  normal study   Hx of echocardiogram    a. Echo 2/14:  Mild LVH, EF 60-65%, Gr 1 diast dysfn, mild to mod AS, mean 17 mmHg, AVA 1.3 (VTI), trivial MR, mild LAE, PASP 31    Hyperlipemia    Hypertension    Hypothyroid    Left knee pain    Leg pain    Low back pain    Obstructive sleep apnea    OSA on CPAP    Other disorders of intestinal carbohydrate absorption    Other type of osteoarthritis, unspecified site    Overweight    Pain in thoracic spine    Palpitations    a. event monitor 3/14:  NSR, sinus brady   Plantar fasciitis    Plantar fasciitis    Right shoulder pain    S/P cholecystectomy    S/P TAH-BSO    Sleep apnea    Stroke (HCC)    tia 2016   TIA (transient ischemic attack)    Varicose veins of unspecified lower extremity with inflammation     Past Surgical History:  Procedure Laterality Date   ABDOMINAL HYSTERECTOMY     CHOLECYSTECTOMY     EP IMPLANTABLE DEVICE N/A 06/29/2016  Procedure: Loop Recorder Insertion;  Surgeon: Will Jorja Loa, MD;  [facility];  Service: Cardiovascular;  Laterality: N/A;   FOOT SURGERY  1955   INCONTINENCE SURGERY     LOOP RECORDER REMOVAL N/A 08/10/2017   Procedure: LOOP RECORDER REMOVAL;  Surgeon: Regan Lemming, MD;  [facility]: [facility];  Service: Cardiovascular;  Laterality: N/A;   TONSILLECTOMY      Allergies  Allergen Reactions   Bee Venom Other (See Comments)    Unknown reaction Per facility    Cipro [Ciprofloxacin Hcl] Other (See Comments)    Unknown reaction  Per facility   Crestor [Rosuvastatin] Other (See Comments)    Myalgias    Lodine [Etodolac] Other (See Comments)    Unknown reaction Per facility   Naprosyn [Naproxen] Other (See Comments)    Unknown reaction Per facility   Sulfonamide Derivatives Other (See Comments)    Unknown reaction Per facility   Tositumomab    Epipen [Epinephrine] Palpitations   Macrobid [Nitrofurantoin Macrocrystal] Rash   Metrocream  [Metronidazole] Rash   Xanax [Alprazolam] Rash    Outpatient Encounter Medications as of 08/11/2023  Medication Sig   acetaminophen (TYLENOL) 500 MG tablet Take 1 tablet (500 mg total) by mouth every 8 (eight) hours as needed for moderate pain.   albuterol (PROVENTIL) (2.5 MG/3ML) 0.083% nebulizer solution Take 2.5 mg by nebulization every 6 (six) hours as needed for wheezing or shortness of breath.   atorvastatin (LIPITOR) 40 MG tablet Take 1 tablet (40 mg total) by mouth daily.   clopidogrel (PLAVIX) 75 MG tablet Take 1 tablet (75 mg total) by mouth daily.   escitalopram (LEXAPRO) 10 MG tablet Take 15 mg by mouth at bedtime.   felodipine (PLENDIL) 5 MG 24 hr tablet Take 5 mg by mouth daily.   gabapentin (NEURONTIN) 100 MG capsule Take 100 mg by mouth at bedtime as needed.   GUAIFENESIN PO Take 10 mLs by mouth every 6 (six) hours as needed.   indapamide (LOZOL) 2.5 MG tablet Take 2.5 mg by mouth daily.   KETOCONAZOLE, TOPICAL, 1 % SHAM Apply 1 Application topically 2 (two) times a week. Tuesday & Friday   Lactobacillus-Inulin (CULTURELLE DIGESTIVE HEALTH PO) Take 500 mg by mouth as needed (for loose stool or antibiotic use twice daily as needed.).   levothyroxine (SYNTHROID, LEVOTHROID) 50 MCG tablet Take 50 mcg by mouth in the morning.   melatonin 5 MG TABS Take 5 mg by mouth daily.   memantine (NAMENDA) 5 MG tablet Take 1 tablet (5 mg total) by mouth 2 (two) times daily. Start with 5mg  daily for 1 week and then increase to 5mg  BID   polyethylene glycol (MIRALAX / GLYCOLAX) 17 g packet Take 17 g by mouth daily.   traZODone (DESYREL) 50 MG tablet Take 50 mg by mouth daily.   No facility-administered encounter medications on file as of 08/11/2023.    Review of Systems:  Review of Systems  Constitutional:  Negative for activity change and appetite change.  HENT: Negative.    Respiratory:  Negative for cough and shortness of breath.   Cardiovascular:  Negative for leg swelling.   Gastrointestinal:  Positive for blood in stool and constipation.  Genitourinary: Negative.   Musculoskeletal:  Positive for gait problem. Negative for arthralgias and myalgias.  Skin: Negative.   Neurological:  Negative for dizziness and weakness.  Psychiatric/Behavioral:  Positive for confusion. Negative for dysphoric mood and sleep disturbance.     Health Maintenance  Topic Date Due   Pneumonia Vaccine 56+ Years old (1 of 2 - PCV) Never done   Zoster Vaccines- Shingrix (1 of 2) Never done   INFLUENZA VACCINE  Never done   Medicare Annual Wellness (AWV)  06/26/2023   DTaP/Tdap/Td (3 - Td or Tdap) 06/14/2025   DEXA SCAN  Completed   HPV VACCINES  Aged Out   COVID-19 Vaccine  Discontinued    Physical Exam: There were no vitals filed for this visit. There is no height or weight on file to calculate BMI. Physical Exam Vitals reviewed.  Constitutional:      Appearance: Normal appearance.  HENT:     Head: Normocephalic.     Nose: Nose normal.     Mouth/Throat:     Mouth: Mucous membranes are moist.     Pharynx: Oropharynx is clear.  Eyes:     Pupils: Pupils are equal, round, and reactive to light.  Cardiovascular:     Rate and Rhythm: Normal rate and regular rhythm.     Pulses: Normal pulses.     Heart sounds: Normal heart sounds.  Pulmonary:     Effort: Pulmonary effort is normal.     Breath sounds: Normal breath sounds.  Abdominal:     General: Abdomen is flat. Bowel sounds are normal.     Palpations: Abdomen is soft.  Genitourinary:    Comments: Has Inflamed external hemorrhoids  Musculoskeletal:     Cervical back: Neck supple.  Skin:    General: Skin is warm.  Neurological:     General: No focal deficit present.     Mental Status: She is alert.  Psychiatric:        Mood and Affect: Mood normal.        Behavior: Behavior normal.        Thought Content: Thought content normal.     Labs reviewed: Basic Metabolic Panel: Recent Labs    11/20/22 0000  11/23/22 0000  NA 142  --   K 3.5  --   CL 103  --   CO2 28*  --   BUN 14  --   CREATININE 0.8  --   CALCIUM 8.4*  --   TSH  --  0.80   Liver Function Tests: Recent Labs    11/20/22 0000  AST 13  ALT 7  ALKPHOS 52  ALBUMIN 3.4*   No results for input(s): "LIPASE", "AMYLASE" in the last 8760 hours. No results for input(s): "AMMONIA" in the last 8760 hours. CBC: Recent Labs    11/20/22 0000  WBC 6.6  NEUTROABS 3,703.00  HGB 13.5  HCT 40  PLT 215   Lipid Panel: Recent Labs    11/20/22 0000  CHOL 105  HDL 39  LDLCALC 47  TRIG 100   Lab Results  Component Value Date   HGBA1C 6.7 11/23/2022    Procedures since last visit: No results found.  Assessment/Plan 1. External hemorrhoids (Primary) Anusol HC BID for 2 weeks   2. Irritable bowel syndrome with both constipation and diarrhea Make Miralax QD    Labs/tests ordered:  * No order type specified * Next appt:  Visit date not found

## 2023-08-15 DIAGNOSIS — R41841 Cognitive communication deficit: Secondary | ICD-10-CM | POA: Diagnosis not present

## 2023-08-15 DIAGNOSIS — Z9181 History of falling: Secondary | ICD-10-CM | POA: Diagnosis not present

## 2023-08-15 DIAGNOSIS — M6281 Muscle weakness (generalized): Secondary | ICD-10-CM | POA: Diagnosis not present

## 2023-08-15 DIAGNOSIS — R2681 Unsteadiness on feet: Secondary | ICD-10-CM | POA: Diagnosis not present

## 2023-08-15 DIAGNOSIS — R278 Other lack of coordination: Secondary | ICD-10-CM | POA: Diagnosis not present

## 2023-08-15 DIAGNOSIS — S93402A Sprain of unspecified ligament of left ankle, initial encounter: Secondary | ICD-10-CM | POA: Diagnosis not present

## 2023-08-16 DIAGNOSIS — R41841 Cognitive communication deficit: Secondary | ICD-10-CM | POA: Diagnosis not present

## 2023-08-16 DIAGNOSIS — I69928 Other speech and language deficits following unspecified cerebrovascular disease: Secondary | ICD-10-CM | POA: Diagnosis not present

## 2023-08-16 DIAGNOSIS — R278 Other lack of coordination: Secondary | ICD-10-CM | POA: Diagnosis not present

## 2023-08-16 DIAGNOSIS — F801 Expressive language disorder: Secondary | ICD-10-CM | POA: Diagnosis not present

## 2023-08-18 DIAGNOSIS — M6281 Muscle weakness (generalized): Secondary | ICD-10-CM | POA: Diagnosis not present

## 2023-08-18 DIAGNOSIS — R41841 Cognitive communication deficit: Secondary | ICD-10-CM | POA: Diagnosis not present

## 2023-08-18 DIAGNOSIS — R278 Other lack of coordination: Secondary | ICD-10-CM | POA: Diagnosis not present

## 2023-08-18 DIAGNOSIS — F801 Expressive language disorder: Secondary | ICD-10-CM | POA: Diagnosis not present

## 2023-08-18 DIAGNOSIS — R2681 Unsteadiness on feet: Secondary | ICD-10-CM | POA: Diagnosis not present

## 2023-08-18 DIAGNOSIS — S93402A Sprain of unspecified ligament of left ankle, initial encounter: Secondary | ICD-10-CM | POA: Diagnosis not present

## 2023-08-18 DIAGNOSIS — I69928 Other speech and language deficits following unspecified cerebrovascular disease: Secondary | ICD-10-CM | POA: Diagnosis not present

## 2023-08-18 DIAGNOSIS — Z9181 History of falling: Secondary | ICD-10-CM | POA: Diagnosis not present

## 2023-08-19 DIAGNOSIS — M6281 Muscle weakness (generalized): Secondary | ICD-10-CM | POA: Diagnosis not present

## 2023-08-19 DIAGNOSIS — F411 Generalized anxiety disorder: Secondary | ICD-10-CM | POA: Diagnosis not present

## 2023-08-19 DIAGNOSIS — F3341 Major depressive disorder, recurrent, in partial remission: Secondary | ICD-10-CM | POA: Diagnosis not present

## 2023-08-19 DIAGNOSIS — Z9181 History of falling: Secondary | ICD-10-CM | POA: Diagnosis not present

## 2023-08-22 DIAGNOSIS — R41841 Cognitive communication deficit: Secondary | ICD-10-CM | POA: Diagnosis not present

## 2023-08-22 DIAGNOSIS — M6281 Muscle weakness (generalized): Secondary | ICD-10-CM | POA: Diagnosis not present

## 2023-08-22 DIAGNOSIS — I69928 Other speech and language deficits following unspecified cerebrovascular disease: Secondary | ICD-10-CM | POA: Diagnosis not present

## 2023-08-22 DIAGNOSIS — S93402A Sprain of unspecified ligament of left ankle, initial encounter: Secondary | ICD-10-CM | POA: Diagnosis not present

## 2023-08-22 DIAGNOSIS — R278 Other lack of coordination: Secondary | ICD-10-CM | POA: Diagnosis not present

## 2023-08-22 DIAGNOSIS — F801 Expressive language disorder: Secondary | ICD-10-CM | POA: Diagnosis not present

## 2023-08-22 DIAGNOSIS — Z9181 History of falling: Secondary | ICD-10-CM | POA: Diagnosis not present

## 2023-08-22 DIAGNOSIS — R2681 Unsteadiness on feet: Secondary | ICD-10-CM | POA: Diagnosis not present

## 2023-08-24 DIAGNOSIS — R2681 Unsteadiness on feet: Secondary | ICD-10-CM | POA: Diagnosis not present

## 2023-08-24 DIAGNOSIS — S93402A Sprain of unspecified ligament of left ankle, initial encounter: Secondary | ICD-10-CM | POA: Diagnosis not present

## 2023-08-24 DIAGNOSIS — R41841 Cognitive communication deficit: Secondary | ICD-10-CM | POA: Diagnosis not present

## 2023-08-24 DIAGNOSIS — R278 Other lack of coordination: Secondary | ICD-10-CM | POA: Diagnosis not present

## 2023-08-24 DIAGNOSIS — M6281 Muscle weakness (generalized): Secondary | ICD-10-CM | POA: Diagnosis not present

## 2023-08-24 DIAGNOSIS — Z9181 History of falling: Secondary | ICD-10-CM | POA: Diagnosis not present

## 2023-08-25 DIAGNOSIS — R2681 Unsteadiness on feet: Secondary | ICD-10-CM | POA: Diagnosis not present

## 2023-08-25 DIAGNOSIS — S93402A Sprain of unspecified ligament of left ankle, initial encounter: Secondary | ICD-10-CM | POA: Diagnosis not present

## 2023-08-25 DIAGNOSIS — F801 Expressive language disorder: Secondary | ICD-10-CM | POA: Diagnosis not present

## 2023-08-25 DIAGNOSIS — M6281 Muscle weakness (generalized): Secondary | ICD-10-CM | POA: Diagnosis not present

## 2023-08-25 DIAGNOSIS — R41841 Cognitive communication deficit: Secondary | ICD-10-CM | POA: Diagnosis not present

## 2023-08-25 DIAGNOSIS — R278 Other lack of coordination: Secondary | ICD-10-CM | POA: Diagnosis not present

## 2023-08-25 DIAGNOSIS — Z9181 History of falling: Secondary | ICD-10-CM | POA: Diagnosis not present

## 2023-08-25 DIAGNOSIS — I69928 Other speech and language deficits following unspecified cerebrovascular disease: Secondary | ICD-10-CM | POA: Diagnosis not present

## 2023-08-26 DIAGNOSIS — Z9181 History of falling: Secondary | ICD-10-CM | POA: Diagnosis not present

## 2023-08-26 DIAGNOSIS — M6281 Muscle weakness (generalized): Secondary | ICD-10-CM | POA: Diagnosis not present

## 2023-08-29 DIAGNOSIS — R2681 Unsteadiness on feet: Secondary | ICD-10-CM | POA: Diagnosis not present

## 2023-08-29 DIAGNOSIS — Z9181 History of falling: Secondary | ICD-10-CM | POA: Diagnosis not present

## 2023-08-29 DIAGNOSIS — I69928 Other speech and language deficits following unspecified cerebrovascular disease: Secondary | ICD-10-CM | POA: Diagnosis not present

## 2023-08-29 DIAGNOSIS — R41841 Cognitive communication deficit: Secondary | ICD-10-CM | POA: Diagnosis not present

## 2023-08-29 DIAGNOSIS — M6281 Muscle weakness (generalized): Secondary | ICD-10-CM | POA: Diagnosis not present

## 2023-08-29 DIAGNOSIS — R278 Other lack of coordination: Secondary | ICD-10-CM | POA: Diagnosis not present

## 2023-08-29 DIAGNOSIS — S93402A Sprain of unspecified ligament of left ankle, initial encounter: Secondary | ICD-10-CM | POA: Diagnosis not present

## 2023-08-29 DIAGNOSIS — F801 Expressive language disorder: Secondary | ICD-10-CM | POA: Diagnosis not present

## 2023-08-31 DIAGNOSIS — R41841 Cognitive communication deficit: Secondary | ICD-10-CM | POA: Diagnosis not present

## 2023-08-31 DIAGNOSIS — R2681 Unsteadiness on feet: Secondary | ICD-10-CM | POA: Diagnosis not present

## 2023-08-31 DIAGNOSIS — R278 Other lack of coordination: Secondary | ICD-10-CM | POA: Diagnosis not present

## 2023-08-31 DIAGNOSIS — M6281 Muscle weakness (generalized): Secondary | ICD-10-CM | POA: Diagnosis not present

## 2023-08-31 DIAGNOSIS — Z9181 History of falling: Secondary | ICD-10-CM | POA: Diagnosis not present

## 2023-08-31 DIAGNOSIS — S93402A Sprain of unspecified ligament of left ankle, initial encounter: Secondary | ICD-10-CM | POA: Diagnosis not present

## 2023-09-02 DIAGNOSIS — M6281 Muscle weakness (generalized): Secondary | ICD-10-CM | POA: Diagnosis not present

## 2023-09-02 DIAGNOSIS — Z9181 History of falling: Secondary | ICD-10-CM | POA: Diagnosis not present

## 2023-09-05 DIAGNOSIS — R2681 Unsteadiness on feet: Secondary | ICD-10-CM | POA: Diagnosis not present

## 2023-09-05 DIAGNOSIS — S93402A Sprain of unspecified ligament of left ankle, initial encounter: Secondary | ICD-10-CM | POA: Diagnosis not present

## 2023-09-05 DIAGNOSIS — R41841 Cognitive communication deficit: Secondary | ICD-10-CM | POA: Diagnosis not present

## 2023-09-05 DIAGNOSIS — M6281 Muscle weakness (generalized): Secondary | ICD-10-CM | POA: Diagnosis not present

## 2023-09-05 DIAGNOSIS — R278 Other lack of coordination: Secondary | ICD-10-CM | POA: Diagnosis not present

## 2023-09-05 DIAGNOSIS — Z9181 History of falling: Secondary | ICD-10-CM | POA: Diagnosis not present

## 2023-09-06 DIAGNOSIS — R41841 Cognitive communication deficit: Secondary | ICD-10-CM | POA: Diagnosis not present

## 2023-09-06 DIAGNOSIS — S93402A Sprain of unspecified ligament of left ankle, initial encounter: Secondary | ICD-10-CM | POA: Diagnosis not present

## 2023-09-06 DIAGNOSIS — Z9181 History of falling: Secondary | ICD-10-CM | POA: Diagnosis not present

## 2023-09-06 DIAGNOSIS — M6281 Muscle weakness (generalized): Secondary | ICD-10-CM | POA: Diagnosis not present

## 2023-09-06 DIAGNOSIS — R278 Other lack of coordination: Secondary | ICD-10-CM | POA: Diagnosis not present

## 2023-09-06 DIAGNOSIS — R2681 Unsteadiness on feet: Secondary | ICD-10-CM | POA: Diagnosis not present

## 2023-09-07 DIAGNOSIS — R41841 Cognitive communication deficit: Secondary | ICD-10-CM | POA: Diagnosis not present

## 2023-09-07 DIAGNOSIS — M6281 Muscle weakness (generalized): Secondary | ICD-10-CM | POA: Diagnosis not present

## 2023-09-07 DIAGNOSIS — S93402A Sprain of unspecified ligament of left ankle, initial encounter: Secondary | ICD-10-CM | POA: Diagnosis not present

## 2023-09-07 DIAGNOSIS — R2681 Unsteadiness on feet: Secondary | ICD-10-CM | POA: Diagnosis not present

## 2023-09-07 DIAGNOSIS — Z9181 History of falling: Secondary | ICD-10-CM | POA: Diagnosis not present

## 2023-09-07 DIAGNOSIS — R278 Other lack of coordination: Secondary | ICD-10-CM | POA: Diagnosis not present

## 2023-09-08 DIAGNOSIS — R41841 Cognitive communication deficit: Secondary | ICD-10-CM | POA: Diagnosis not present

## 2023-09-08 DIAGNOSIS — F801 Expressive language disorder: Secondary | ICD-10-CM | POA: Diagnosis not present

## 2023-09-08 DIAGNOSIS — I69928 Other speech and language deficits following unspecified cerebrovascular disease: Secondary | ICD-10-CM | POA: Diagnosis not present

## 2023-09-08 DIAGNOSIS — R278 Other lack of coordination: Secondary | ICD-10-CM | POA: Diagnosis not present

## 2023-09-09 ENCOUNTER — Encounter: Payer: Self-pay | Admitting: Orthopedic Surgery

## 2023-09-09 DIAGNOSIS — Z9181 History of falling: Secondary | ICD-10-CM | POA: Diagnosis not present

## 2023-09-09 DIAGNOSIS — M6281 Muscle weakness (generalized): Secondary | ICD-10-CM | POA: Diagnosis not present

## 2023-09-09 NOTE — Progress Notes (Signed)
 This encounter was created in error - please disregard.

## 2023-09-12 DIAGNOSIS — M6281 Muscle weakness (generalized): Secondary | ICD-10-CM | POA: Diagnosis not present

## 2023-09-12 DIAGNOSIS — R2681 Unsteadiness on feet: Secondary | ICD-10-CM | POA: Diagnosis not present

## 2023-09-12 DIAGNOSIS — S93402A Sprain of unspecified ligament of left ankle, initial encounter: Secondary | ICD-10-CM | POA: Diagnosis not present

## 2023-09-12 DIAGNOSIS — Z9181 History of falling: Secondary | ICD-10-CM | POA: Diagnosis not present

## 2023-09-12 DIAGNOSIS — R278 Other lack of coordination: Secondary | ICD-10-CM | POA: Diagnosis not present

## 2023-09-12 DIAGNOSIS — R41841 Cognitive communication deficit: Secondary | ICD-10-CM | POA: Diagnosis not present

## 2023-09-16 ENCOUNTER — Encounter: Payer: Self-pay | Admitting: Orthopedic Surgery

## 2023-09-16 ENCOUNTER — Non-Acute Institutional Stay: Payer: Self-pay | Admitting: Orthopedic Surgery

## 2023-09-16 DIAGNOSIS — E039 Hypothyroidism, unspecified: Secondary | ICD-10-CM

## 2023-09-16 DIAGNOSIS — R4189 Other symptoms and signs involving cognitive functions and awareness: Secondary | ICD-10-CM

## 2023-09-16 DIAGNOSIS — Z8673 Personal history of transient ischemic attack (TIA), and cerebral infarction without residual deficits: Secondary | ICD-10-CM

## 2023-09-16 DIAGNOSIS — E785 Hyperlipidemia, unspecified: Secondary | ICD-10-CM | POA: Diagnosis not present

## 2023-09-16 DIAGNOSIS — F339 Major depressive disorder, recurrent, unspecified: Secondary | ICD-10-CM

## 2023-09-16 DIAGNOSIS — R001 Bradycardia, unspecified: Secondary | ICD-10-CM | POA: Diagnosis not present

## 2023-09-16 DIAGNOSIS — I10 Essential (primary) hypertension: Secondary | ICD-10-CM

## 2023-09-16 DIAGNOSIS — F5101 Primary insomnia: Secondary | ICD-10-CM

## 2023-09-16 DIAGNOSIS — K582 Mixed irritable bowel syndrome: Secondary | ICD-10-CM

## 2023-09-16 DIAGNOSIS — G4733 Obstructive sleep apnea (adult) (pediatric): Secondary | ICD-10-CM

## 2023-09-16 NOTE — Progress Notes (Signed)
 Location:  [facility] Room Number: 10/A Place of Service:  ALF [phone]) Provider:  Octavia Heir, NP   Mahlon Gammon, MD  Patient Care Team: Mahlon Gammon, MD as PCP - General (Internal Medicine) Regan Lemming, MD as PCP - Cardiology (Cardiology) Antony Contras, MD as Consulting Physician (Ophthalmology) Edwinna Areola, DO as Consulting Physician (Obstetrics and Gynecology) Dohmeier, Porfirio Mylar, MD as Consulting Physician (Neurology) Serena Colonel, MD as Consulting Physician (Otolaryngology)  Extended Emergency Contact Information Primary Emergency Contact: [facility] Address: [address]          [place], [place] [address] [place] Home Phone: [phone] Mobile Phone: [phone] Relation: Daughter Secondary Emergency Contact: Kerry Fort Address: [place] Home Phone: [phone] Relation: Brother  Code Status:  DNR Goals of care: Advanced Directive information    06/27/2023   10:24 AM  Advanced Directives  Does Patient Have a Medical Advance Directive? Yes  Type of Estate agent of [place];Living will;Out of facility DNR (pink MOST or yellow form)  Does patient want to make changes to medical advance directive? No - Patient declined  Copy of Healthcare Power of Attorney in Chart? Yes - validated most recent copy scanned in chart (See row information)     Chief Complaint  Patient presents with   Medical Management of Chronic Issues    HPI:  Pt is a 89 y.o. female seen today for medical management of chronic diseases.    She currently resides on the assisted living unit at [facility] PMH: HTN, HLD, loop recorder removal 2019,  sleep apnea, hypothyroidism, ischemic stroke to Left MCA 04/2022, aortic stenosis, unstable gait, insomnia and depression.    HTN- BUN/creat 27/0.96 07/04/2023, remains on felodipine HLD- Total 105, LDL 47 11/2022,  remains on atorvastatin Bradycardia- HR averaging 40-60's, loop recorder removed 2019, evaluated by Dr. Bjorn Pippin EKG sinus brady> no need for pacemaker  H/o stroke- 2 strokes in past per daughter, 06/2023 noted to have slurred speech,  ischemic stroke to left MCA 04/2022, remains on Plavix and atorvastatin Cognitive impairment- MMSE 22/30, lives in assisted living, no behaviors, requires assistance with ADLs like bathing/tolieting at times and medication management, not on medication Hypothyroidism- TSH 0.80 11/2022, not on medication Depression- no mood changes, Na+ 143 07/04/2023, very supportive family and visit often, remains on Lexapro Insomnia- remains on melatonin and Trazodone OSA- cont CPAP at bedtime IBS- remains on miralax daily  Recent weights:  04/01- 179.6 lbs  02/01- 178 lbs  11/01- 176.4 lbs  Recent blood pressures:  04/02- 106/66  03/26- 124/60  03/19- 130/61      Past Medical History:  Diagnosis Date   Allergy    Aortic stenosis    a. mild to mod by Echo 07/2012   Arthritis    Asymptomatic varicose veins    Benign paroxysmal vertigo    Bradycardia, unspecified    Bronchitis, not specified as acute or chronic    Cardiac murmur    Cataracts, bilateral    Chest pain, unspecified    Chronic cystitis    Depression    Diarrhea    Dyspnea    Dysuria    Frequency of micturition    Frequent urination    Gait disturbance 06/10/2014   Gastric ulcer    Hearing loss    Hemorrhoids    History of bone density study    History of colonoscopy    History  of frequent urinary tract infections    History of urinary tract infection    Hx of cardiovascular stress test    a. ETT-Echo 3/12:  EF 60%, normal study   Hx of echocardiogram    a. Echo 2/14:  Mild LVH, EF 60-65%, Gr 1 diast dysfn, mild to mod AS, mean 17 mmHg, AVA 1.3 (VTI), trivial MR, mild LAE, PASP 31    Hyperlipemia    Hypertension    Hypothyroid    Left knee pain    Leg pain    Low back pain     Obstructive sleep apnea    OSA on CPAP    Other disorders of intestinal carbohydrate absorption    Other type of osteoarthritis, unspecified site    Overweight    Pain in thoracic spine    Palpitations    a. event monitor 3/14:  NSR, sinus brady   Plantar fasciitis    Plantar fasciitis    Right shoulder pain    S/P cholecystectomy    S/P TAH-BSO    Sleep apnea    Stroke (HCC)    tia 2016   TIA (transient ischemic attack)    Varicose veins of unspecified lower extremity with inflammation    Past Surgical History:  Procedure Laterality Date   ABDOMINAL HYSTERECTOMY     CHOLECYSTECTOMY     EP IMPLANTABLE DEVICE N/A 06/29/2016   Procedure: Loop Recorder Insertion;  Surgeon: Will Jorja Loa, MD;  [facility];  Service: Cardiovascular;  Laterality: N/A;   FOOT SURGERY  1955   INCONTINENCE SURGERY     LOOP RECORDER REMOVAL N/A 08/10/2017   Procedure: LOOP RECORDER REMOVAL;  Surgeon: Regan Lemming, MD;  [facility];  Service: Cardiovascular;  Laterality: N/A;   TONSILLECTOMY      Allergies  Allergen Reactions   Bee Venom Other (See Comments)    Unknown reaction Per facility    Cipro [Ciprofloxacin Hcl] Other (See Comments)    Unknown reaction  Per facility   Crestor [Rosuvastatin] Other (See Comments)    Myalgias    Lodine [Etodolac] Other (See Comments)    Unknown reaction Per facility   Naprosyn [Naproxen] Other (See Comments)    Unknown reaction Per facility   Sulfonamide Derivatives Other (See Comments)    Unknown reaction Per facility   Tositumomab    Epipen [Epinephrine] Palpitations   Macrobid [Nitrofurantoin Macrocrystal] Rash   Metrocream [Metronidazole] Rash   Xanax [Alprazolam] Rash    Outpatient Encounter Medications as of 09/16/2023  Medication Sig   acetaminophen (TYLENOL) 500 MG tablet Take 1 tablet (500 mg total) by mouth every 8 (eight) hours as needed for moderate pain.   albuterol (PROVENTIL) (2.5 MG/3ML)  0.083% nebulizer solution Take 2.5 mg by nebulization every 6 (six) hours as needed for wheezing or shortness of breath.   atorvastatin (LIPITOR) 40 MG tablet Take 1 tablet (40 mg total) by mouth daily.   clopidogrel (PLAVIX) 75 MG tablet Take 1 tablet (75 mg total) by mouth daily.   escitalopram (LEXAPRO) 10 MG tablet Take 15 mg by mouth at bedtime.   felodipine (PLENDIL) 5 MG 24 hr tablet Take 5 mg by mouth daily.   GUAIFENESIN PO Take 10 mLs by mouth every 6 (six) hours as needed.   hydrocortisone (ANUSOL-HC) 2.5 % rectal cream Place 1 Application rectally 2 (two) times daily.   indapamide (LOZOL) 2.5 MG tablet Take 2.5 mg by mouth daily.   KETOCONAZOLE, TOPICAL,  1 % SHAM Apply 1 Application topically 2 (two) times a week. Tuesday & Friday   Lactobacillus-Inulin (CULTURELLE DIGESTIVE HEALTH PO) Take 500 mg by mouth as needed (for loose stool or antibiotic use twice daily as needed.).   levothyroxine (SYNTHROID, LEVOTHROID) 50 MCG tablet Take 50 mcg by mouth in the morning.   melatonin 5 MG TABS Take 5 mg by mouth daily.   memantine (NAMENDA) 5 MG tablet Take 1 tablet (5 mg total) by mouth 2 (two) times daily. Start with 5mg  daily for 1 week and then increase to 5mg  BID   polyethylene glycol (MIRALAX / GLYCOLAX) 17 g packet Take 17 g by mouth daily.   traZODone (DESYREL) 50 MG tablet Take 50 mg by mouth daily.   No facility-administered encounter medications on file as of 09/16/2023.    Review of Systems  Unable to perform ROS: Dementia    Immunization History  Administered Date(s) Administered   DTaP 09/30/1995   Moderna SARS-COV2 Booster Vaccination 03/25/2020, 11/25/2021   Moderna Sars-Covid-2 Vaccination 07/02/2019, 07/16/2019, 11/19/2020   Respiratory Syncytial Virus Vaccine,Recomb Aduvanted(Arexvy) 06/26/2022   Tdap 06/15/2015   Pertinent  Health Maintenance Due  Topic Date Due   INFLUENZA VACCINE  01/13/2024   DEXA SCAN  Completed      04/18/2022   10:00 AM 06/25/2022    10:22 AM 06/25/2022    1:53 PM 08/25/2022   12:58 PM 06/27/2023   12:22 PM  Fall Risk  Falls in the past year?  0 0 1 1  Was there an injury with Fall?  0 0 1 1  Fall Risk Category Calculator  0 0 2 2  Fall Risk Category (Retired)  Low Low    (RETIRED) Patient Fall Risk Level High fall risk High fall risk High fall risk    Patient at Risk for Falls Due to  History of fall(s) History of fall(s);Impaired balance/gait;Impaired mobility History of fall(s);Impaired balance/gait;Impaired mobility History of fall(s);Impaired balance/gait  Fall risk Follow up  Falls evaluation completed Falls evaluation completed;Education provided Falls evaluation completed;Education provided Falls evaluation completed;Education provided   Functional Status Survey:    Vitals:   09/16/23 1250  BP: 106/66  Pulse: (!) 58  Resp: 20  Temp: 97.9 F (36.6 C)  SpO2: 94%  Weight: 179 lb 9.6 oz (81.5 kg)  Height: 5\' 5"  (1.651 m)   Body mass index is 29.89 kg/m. Physical Exam Vitals reviewed.  Constitutional:      General: She is not in acute distress. HENT:     Head: Normocephalic.  Eyes:     General:        Right eye: No discharge.        Left eye: No discharge.  Cardiovascular:     Rate and Rhythm: Regular rhythm. Bradycardia present.     Pulses: Normal pulses.     Heart sounds: Murmur heard.  Pulmonary:     Effort: Pulmonary effort is normal.     Breath sounds: Normal breath sounds.  Abdominal:     General: Bowel sounds are normal. There is no distension.     Palpations: Abdomen is soft.     Tenderness: There is no abdominal tenderness.  Musculoskeletal:     Cervical back: Neck supple.     Right lower leg: No edema.     Left lower leg: No edema.  Skin:    General: Skin is warm.     Capillary Refill: Capillary refill takes less than 2 seconds.  Neurological:  General: No focal deficit present.     Mental Status: She is alert. Mental status is at baseline.     Gait: Gait abnormal.   Psychiatric:        Mood and Affect: Mood normal.     Labs reviewed: Recent Labs    11/20/22 0000  NA 142  K 3.5  CL 103  CO2 28*  BUN 14  CREATININE 0.8  CALCIUM 8.4*   Recent Labs    11/20/22 0000  AST 13  ALT 7  ALKPHOS 52  ALBUMIN 3.4*   Recent Labs    11/20/22 0000  WBC 6.6  NEUTROABS 3,703.00  HGB 13.5  HCT 40  PLT 215   Lab Results  Component Value Date   TSH 0.80 11/23/2022   Lab Results  Component Value Date   HGBA1C 6.7 11/23/2022   Lab Results  Component Value Date   CHOL 105 11/20/2022   HDL 39 11/20/2022   LDLCALC 47 11/20/2022   LDLDIRECT 135.1 08/26/2010   TRIG 100 11/20/2022   CHOLHDL 3.9 04/17/2022    Significant Diagnostic Results in last 30 days:  No results found.  Assessment/Plan 1. Essential hypertension (Primary) - controlled with felopdine and indapamide  2. Hyperlipidemia, unspecified hyperlipidemia type - LDL 47 11/2022 - cont atorvastatin  3. Bradycardia - ongoing, HR 40-60's - evaluated by cardiology in past   4. History of stroke - followed by neurology - 06/2023 slurred speech>neurology evaluation - cont Plavix and atorvastatin  5. Cognitive impairment - MMSE 20/30 - dependent with ADLs except feeding - weight stable - cont Namenda  6. Hypothyroidism, unspecified type - TSH stable - cont levothyroxine  7. Recurrent depression (HCC) - no mood changes - Na+ stable - cont Lexapro  8. Primary insomnia - cont melatonin and Trazodone  9. OSA (obstructive sleep apnea) - cont CPAP qhs  10. Irritable bowel syndrome with both constipation and diarrhea - cont miralax    Family/ staff Communication: plan discussed with patient and nurse  Labs/tests ordered:  none

## 2023-10-13 ENCOUNTER — Telehealth: Payer: Self-pay | Admitting: Adult Health

## 2023-10-13 NOTE — Telephone Encounter (Signed)
 Pt's daughter, Sharlyn Deaner (DPOA)experiencing signs of Alzheimer's; personality change, short term long term memory loss, mobility issue (can not stand or sit down without help, self care is beginning to decrease, difficulty multiple tasking, reaction to stress: jerks, word finding., repetition, difficulty sleeping (trying different things to help her sleep), no sense of direction (could not remember which way is left or right), mild visual hallucination.  Request appointment to be evaluated for Alzheimer's and Vascular Dementia. Would like a call back. Ms. Jeanetta Milian asking if can call with in the an hour while not with the patient.

## 2023-10-13 NOTE — Telephone Encounter (Signed)
 Attempted to call Pt daughter. No answer, LVM for call back.

## 2023-10-14 ENCOUNTER — Encounter: Payer: Self-pay | Admitting: Orthopedic Surgery

## 2023-10-14 ENCOUNTER — Non-Acute Institutional Stay: Payer: Self-pay | Admitting: Orthopedic Surgery

## 2023-10-14 DIAGNOSIS — Z0000 Encounter for general adult medical examination without abnormal findings: Secondary | ICD-10-CM

## 2023-10-14 NOTE — Patient Instructions (Signed)
  Ms. Delion , Thank you for taking time to come for your Medicare Wellness Visit. I appreciate your ongoing commitment to your health goals. Please review the following plan we discussed and let me know if I can assist you in the future.   These are the goals we discussed:  Goals      Maintain Mobility and Function     Evidence-based guidance:  Emphasize the importance of physical activity and aerobic exercise as included in treatment plan; assess barriers to adherence; consider patient's abilities and preferences.  Encourage gradual increase in activity or exercise instead of stopping if pain occurs.  Reinforce individual therapy exercise prescription, such as strengthening, stabilization and stretching programs.  Promote optimal body mechanics to stabilize the spine with lifting and functional activity.  Encourage activity and mobility modifications to facilitate optimal function, such as using a log roll for bed mobility or dressing from a seated position.  Reinforce individual adaptive equipment recommendations to limit excessive spinal movements, such as a Event organiser.  Assess adequacy of sleep; encourage use of sleep hygiene techniques, such as bedtime routine; use of white noise; dark, cool bedroom; avoiding daytime naps, heavy meals or exercise before bedtime.  Promote positions and modification to optimize sleep and sexual activity; consider pillows or positioning devices to assist in maintaining neutral spine.  Explore options for applying ergonomic principles at work and home, such as frequent position changes, using ergonomically designed equipment and working at optimal height.  Promote modifications to increase comfort with driving such as lumbar support, optimizing seat and steering wheel position, using cruise control and taking frequent rest stops to stretch and walk.   Notes:         This is a list of the screening recommended for you and due dates:  Health  Maintenance  Topic Date Due   Pneumonia Vaccine (1 of 2 - PCV) Never done   Zoster (Shingles) Vaccine (1 of 2) Never done   Flu Shot  01/13/2024   Medicare Annual Wellness Visit  10/13/2024   DTaP/Tdap/Td vaccine (3 - Td or Tdap) 06/14/2025   DEXA scan (bone density measurement)  Completed   HPV Vaccine  Aged Out   Meningitis B Vaccine  Aged Out   COVID-19 Vaccine  Discontinued

## 2023-10-14 NOTE — Progress Notes (Addendum)
 Subjective:   Autumn Allen is a 89 y.o. female who presents for Medicare Annual (Subsequent) preventive examination.  Visit Complete: In person/ Location: [facility]  Patient Medicare AWV questionnaire was completed by the patient on 10/14/2023; I have confirmed that all information answered by patient is correct and no changes since this date.  Cardiac Risk Factors include: advanced age (>52men, >11 women);hypertension;sedentary lifestyle     Objective:    Today's Vitals   10/14/23 1035  BP: 125/65  Pulse: 71  Resp: 17  Temp: (!) 97.2 F (36.2 C)  SpO2: 96%  Weight: 179 lb 9.6 oz (81.5 kg)  Height: 5\' 5"  (1.651 m)   Body mass index is 29.89 kg/m.     06/27/2023   10:24 AM 03/24/2023    3:56 PM 11/04/2022   10:25 AM 09/13/2022   11:16 AM 06/29/2022   11:09 AM 06/25/2022   10:29 AM 04/16/2022    5:01 PM  Advanced Directives  Does Patient Have a Medical Advance Directive? Yes Yes Yes Yes Yes Yes Yes  Type of Estate agent of [place];Living will;Out of facility DNR (pink MOST or yellow form) Healthcare Power of [place];Living will Healthcare Power of [place];Living will;Out of facility DNR (pink MOST or yellow form) Healthcare Power of [place];Living will;Out of facility DNR (pink MOST or yellow form) Living will;Out of facility DNR (pink MOST or yellow form) Living will   Does patient want to make changes to medical advance directive? No - Patient declined No - Patient declined No - Patient declined No - Patient declined No - Patient declined No - Patient declined   Copy of Healthcare Power of Attorney in Chart? Yes - validated most recent copy scanned in chart (See row information) Yes - validated most recent copy scanned in chart (See row information) Yes - validated most recent copy scanned in chart (See row information) Yes - validated most recent copy scanned in chart (See row information)       Current Medications  (verified) Outpatient Encounter Medications as of 10/14/2023  Medication Sig   acetaminophen  (TYLENOL ) 500 MG tablet Take 1 tablet (500 mg total) by mouth every 8 (eight) hours as needed for moderate pain.   albuterol (PROVENTIL) (2.5 MG/3ML) 0.083% nebulizer solution Take 2.5 mg by nebulization every 6 (six) hours as needed for wheezing or shortness of breath.   atorvastatin  (LIPITOR) 40 MG tablet Take 1 tablet (40 mg total) by mouth daily.   clopidogrel  (PLAVIX ) 75 MG tablet Take 1 tablet (75 mg total) by mouth daily.   escitalopram  (LEXAPRO ) 10 MG tablet Take 15 mg by mouth at bedtime.   felodipine  (PLENDIL ) 5 MG 24 hr tablet Take 5 mg by mouth daily.   GUAIFENESIN PO Take 10 mLs by mouth every 6 (six) hours as needed.   hydrocortisone  (ANUSOL -HC) 2.5 % rectal cream Place 1 Application rectally 2 (two) times daily.   indapamide  (LOZOL ) 2.5 MG tablet Take 2.5 mg by mouth daily.   KETOCONAZOLE, TOPICAL, 1 % SHAM Apply 1 Application topically 2 (two) times a week. Tuesday & Friday   Lactobacillus-Inulin (CULTURELLE DIGESTIVE HEALTH PO) Take 500 mg by mouth as needed (for loose stool or antibiotic use twice daily as needed.).   levothyroxine  (SYNTHROID , LEVOTHROID) 50 MCG tablet Take 50 mcg by mouth in the morning.   melatonin 5 MG TABS Take 5 mg by mouth daily.   memantine  (NAMENDA ) 5 MG tablet Take 1 tablet (5 mg total) by mouth  2 (two) times daily. Start with 5mg  daily for 1 week and then increase to 5mg  BID   polyethylene glycol (MIRALAX / GLYCOLAX) 17 g packet Take 17 g by mouth daily.   traZODone  (DESYREL ) 50 MG tablet Take 50 mg by mouth daily.   No facility-administered encounter medications on file as of 10/14/2023.    Allergies (verified) Bee venom, Cipro [ciprofloxacin hcl], Crestor [rosuvastatin], Lodine [etodolac], Naprosyn [naproxen], Sulfonamide derivatives, Tositumomab, Epipen  [epinephrine ], Macrobid [nitrofurantoin macrocrystal], Metrocream [metronidazole], and Xanax [alprazolam]    History: Past Medical History:  Diagnosis Date   Allergy    Aortic stenosis    a. mild to mod by Echo 07/2012   Arthritis    Asymptomatic varicose veins    Benign paroxysmal vertigo    Bradycardia, unspecified    Bronchitis, not specified as acute or chronic    Cardiac murmur    Cataracts, bilateral    Chest pain, unspecified    Chronic cystitis    Depression    Diarrhea    Dyspnea    Dysuria    Frequency of micturition    Frequent urination    Gait disturbance 06/10/2014   Gastric ulcer    Hearing loss    Hemorrhoids    History of bone density study    History of colonoscopy    History of frequent urinary tract infections    History of urinary tract infection    Hx of cardiovascular stress test    a. ETT-Echo 3/12:  EF 60%, normal study   Hx of echocardiogram    a. Echo 2/14:  Mild LVH, EF 60-65%, Gr 1 diast dysfn, mild to mod AS, mean 17 mmHg, AVA 1.3 (VTI), trivial MR, mild LAE, PASP 31    Hyperlipemia    Hypertension    Hypothyroid    Left knee pain    Leg pain    Low back pain    Obstructive sleep apnea    OSA on CPAP    Other disorders of intestinal carbohydrate absorption    Other type of osteoarthritis, unspecified site    Overweight    Pain in thoracic spine    Palpitations    a. event monitor 3/14:  NSR, sinus brady   Plantar fasciitis    Plantar fasciitis    Right shoulder pain    S/P cholecystectomy    S/P TAH-BSO    Sleep apnea    Stroke (HCC)    tia 2016   TIA (transient ischemic attack)    Varicose veins of unspecified lower extremity with inflammation    Past Surgical History:  Procedure Laterality Date   ABDOMINAL HYSTERECTOMY     CHOLECYSTECTOMY     EP IMPLANTABLE DEVICE N/A 06/29/2016   Procedure: Loop Recorder Insertion;  Surgeon: Will Cortland Ding, MD;  Location: [facility];  Service: Cardiovascular;  Laterality: N/A;   FOOT SURGERY  1955   INCONTINENCE SURGERY     LOOP RECORDER REMOVAL N/A 08/10/2017   Procedure: LOOP  RECORDER REMOVAL;  Surgeon: Lei Pump, MD;  Location: [facility];  Service: Cardiovascular;  Laterality: N/A;   TONSILLECTOMY     Family History  Problem Relation Age of Onset   Heart attack Mother 66   Heart failure Mother    Arthritis Mother    Lung disease Father    Cancer - Prostate Father    Healthy Brother    Healthy Brother    Heart failure Maternal Uncle    Cancer  Son       Tobacco Counseling Counseling given: Not Answered   Clinical Intake:  Pre-visit preparation completed: No  Pain : No/denies pain     BMI - recorded: 29.89 Nutritional Status: BMI 25 -29 Overweight Nutritional Risks: None Diabetes: No  How often do you need to have someone help you when you read instructions, pamphlets, or other written materials from your doctor or pharmacy?: 4 - Often What is the last grade level you completed in school?: College degree  Interpreter Needed?: No      Activities of Daily Living    10/17/2023    9:28 AM 10/14/2023   10:52 AM  In your present state of health, do you have any difficulty performing the following activities:  Hearing? 1 1  Vision? 1 1  Difficulty concentrating or making decisions? 1 1  Walking or climbing stairs? 1 1  Dressing or bathing? 1 1  Comment  sometimes needds assistance  Doing errands, shopping? 1 1  Preparing Food and eating ? Y Y  Using the Toilet? Y Y  In the past six months, have you accidently leaked urine? Y Y  Do you have problems with loss of bowel control? Y Y  Managing your Medications? Y Y  Managing your Finances? Colie Dawes  Housekeeping or managing your Housekeeping? Colie Dawes    Patient Care Team: Marguerite Shiley, MD as PCP - General (Internal Medicine) Lei Pump, MD as PCP - Cardiology (Cardiology) Alvina Axon, MD as Consulting Physician (Ophthalmology) Loa Riling, DO as Consulting Physician (Obstetrics and Gynecology) Dohmeier, Raoul Byes, MD as Consulting Physician  (Neurology) Janita Mellow, MD as Consulting Physician (Otolaryngology)  Indicate any recent Medical Services you may have received from other than [facility] providers in the past year (date may be approximate).     Assessment:   This is a routine wellness examination for Shanessa.  Hearing/Vision screen No results found.   Goals Addressed             This Visit's Progress    Maintain Mobility and Function   On track    Evidence-based guidance:  Emphasize the importance of physical activity and aerobic exercise as included in treatment plan; assess barriers to adherence; consider patient's abilities and preferences.  Encourage gradual increase in activity or exercise instead of stopping if pain occurs.  Reinforce individual therapy exercise prescription, such as strengthening, stabilization and stretching programs.  Promote optimal body mechanics to stabilize the spine with lifting and functional activity.  Encourage activity and mobility modifications to facilitate optimal function, such as using a log roll for bed mobility or dressing from a seated position.  Reinforce individual adaptive equipment recommendations to limit excessive spinal movements, such as a Event organiser.  Assess adequacy of sleep; encourage use of sleep hygiene techniques, such as bedtime routine; use of white noise; dark, cool bedroom; avoiding daytime naps, heavy meals or exercise before bedtime.  Promote positions and modification to optimize sleep and sexual activity; consider pillows or positioning devices to assist in maintaining neutral spine.  Explore options for applying ergonomic principles at work and home, such as frequent position changes, using ergonomically designed equipment and working at optimal height.  Promote modifications to increase comfort with driving such as lumbar support, optimizing seat and steering wheel position, using cruise control and taking frequent rest stops to stretch and walk.    Notes:        Depression Screen    10/14/2023   10:54 AM  06/27/2023   12:22 PM 11/24/2022    2:58 PM 06/25/2022    1:52 PM 06/25/2022   10:28 AM 05/19/2022    9:51 AM  PHQ 2/9 Scores  PHQ - 2 Score 0  2 0 0 0  PHQ- 9 Score   5     Exception Documentation  Medical reason        Fall Risk    10/14/2023   10:55 AM 06/27/2023   12:22 PM 08/25/2022   12:58 PM 06/25/2022    1:53 PM 06/25/2022   10:22 AM  Fall Risk   Falls in the past year? 1 1 1  0 0  Number falls in past yr: 0 0 0 0 0  Injury with Fall? 1 1 1  0 0  Risk for fall due to : History of fall(s);Impaired balance/gait History of fall(s);Impaired balance/gait History of fall(s);Impaired balance/gait;Impaired mobility History of fall(s);Impaired balance/gait;Impaired mobility History of fall(s)  Follow up Falls evaluation completed;Education provided Falls evaluation completed;Education provided Falls evaluation completed;Education provided Falls evaluation completed;Education provided Falls evaluation completed    MEDICARE RISK AT HOME: Medicare Risk at Home Any stairs in or around the home?: No If so, are there any without handrails?: No Home free of loose throw rugs in walkways, pet beds, electrical cords, etc?: Yes Adequate lighting in your home to reduce risk of falls?: Yes Life alert?: Yes (call bell) Use of a cane, walker or w/c?: Yes Grab bars in the bathroom?: Yes Shower chair or bench in shower?: Yes Elevated toilet seat or a handicapped toilet?: Yes  TIMED UP AND GO:  Was the test performed?  No    Cognitive Function:    10/14/2023   10:41 AM 07/04/2023   11:13 AM 06/25/2022    1:55 PM  MMSE - Mini Mental State Exam  Orientation to time 4 4 5   Orientation to Place 5 3 5   Registration 3 3 3   Attention/ Calculation 5 1 5   Recall 3 2 3   Language- name 2 objects 2 2 2   Language- repeat 1 1 1   Language- follow 3 step command 3 3 3   Language- read & follow direction 1 1 1   Write a sentence 1 1 1   Copy design  0 1 1  Total score 28 22 30         Immunizations Immunization History  Administered Date(s) Administered   DTaP 09/30/1995   Moderna SARS-COV2 Booster Vaccination 03/25/2020, 11/25/2021   Moderna Sars-Covid-2 Vaccination 07/02/2019, 07/16/2019, 11/19/2020   Respiratory Syncytial Virus Vaccine,Recomb Aduvanted(Arexvy) 06/26/2022   Tdap 06/15/2015    TDAP status: Due, Education has been provided regarding the importance of this vaccine. Advised may receive this vaccine at local pharmacy or Health Dept. Aware to provide a copy of the vaccination record if obtained from local pharmacy or Health Dept. Verbalized acceptance and understanding.  Flu Vaccine status: Up to date  Pneumococcal vaccine status: Due, Education has been provided regarding the importance of this vaccine. Advised may receive this vaccine at local pharmacy or Health Dept. Aware to provide a copy of the vaccination record if obtained from local pharmacy or Health Dept. Verbalized acceptance and understanding.  Covid-19 vaccine status: Declined, Education has been provided regarding the importance of this vaccine but patient still declined. Advised may receive this vaccine at local pharmacy or Health Dept.or vaccine clinic. Aware to provide a copy of the vaccination record if obtained from local pharmacy or Health Dept. Verbalized acceptance and understanding.  Qualifies for Shingles Vaccine? No  Zostavax completed No   Shingrix Completed?: No.    Education has been provided regarding the importance of this vaccine. Patient has been advised to call insurance company to determine out of pocket expense if they have not yet received this vaccine. Advised may also receive vaccine at local pharmacy or Health Dept. Verbalized acceptance and understanding.  Screening Tests Health Maintenance  Topic Date Due   Pneumonia Vaccine 51+ Years old (1 of 2 - PCV) Never done   Zoster Vaccines- Shingrix (1 of 2) Never done   INFLUENZA  VACCINE  01/13/2024   Medicare Annual Wellness (AWV)  10/13/2024   DTaP/Tdap/Td (3 - Td or Tdap) 06/14/2025   DEXA SCAN  Completed   HPV VACCINES  Aged Out   Meningococcal B Vaccine  Aged Out   COVID-19 Vaccine  Discontinued    Health Maintenance  Health Maintenance Due  Topic Date Due   Pneumonia Vaccine 40+ Years old (1 of 2 - PCV) Never done   Zoster Vaccines- Shingrix (1 of 2) Never done    Colorectal cancer screening: No longer required.   Mammogram status: No longer required due to advanced age.  Bone Density status: Completed unclear when last study was, no future studies due to advanced age. Results reflect: Bone density results: NORMAL. Repeat every cancelled years.  Lung Cancer Screening: (Low Dose CT Chest recommended if Age 36-80 years, 20 pack-year currently smoking OR have quit w/in 15years.) does not qualify.   Lung Cancer Screening Referral: No  Additional Screening:  Hepatitis C Screening: does not qualify; Completed   Vision Screening: Recommended annual ophthalmology exams for early detection of glaucoma and other disorders of the eye. Is the patient up to date with their annual eye exam?  No  Who is the provider or what is the name of the office in which the patient attends annual eye exams? In house provider if needed If pt is not established with a provider, would they like to be referred to a provider to establish care? No .   Dental Screening: Recommended annual dental exams for proper oral hygiene  Diabetic Foot Exam: Diabetic Foot Exam: Completed 09/16/2023  Community Resource Referral / Chronic Care Management: CRR required this visit?  No   CCM required this visit?  No     Plan:     I have personally reviewed and noted the following in the patient's chart:   Medical and social history Use of alcohol , tobacco or illicit drugs  Current medications and supplements including opioid prescriptions. Patient is not currently taking opioid  prescriptions. Functional ability and status Nutritional status Physical activity Advanced directives List of other physicians Hospitalizations, surgeries, and ER visits in previous 12 months Vitals Screenings to include cognitive, depression, and falls Referrals and appointments  In addition, I have reviewed and discussed with patient certain preventive protocols, quality metrics, and best practice recommendations. A written personalized care plan for preventive services as well as general preventive health recommendations were provided to patient.     Arnetha Bhat, NP   10/17/2023   After Visit Summary: (MyChart) Due to this being a telephonic visit, the after visit summary with patients personalized plan was offered to patient via MyChart   Nurse Notes: MMSE 28/30 today. Lives in assisted living. Needs assistance with some ADLs except feeding. Swimming once weekly per nursing. Discussed vaccinations. She does not want pneumonia vaccine at this time. Very supportive daughter who helps with health care decision. I will ask about Shingles  vaccine next encounter.

## 2023-10-15 ENCOUNTER — Encounter: Payer: Self-pay | Admitting: Orthopedic Surgery

## 2023-10-16 ENCOUNTER — Encounter: Payer: Self-pay | Admitting: Orthopedic Surgery

## 2023-10-19 ENCOUNTER — Encounter: Payer: Self-pay | Admitting: Adult Health

## 2023-10-30 ENCOUNTER — Emergency Department (HOSPITAL_COMMUNITY)

## 2023-10-30 ENCOUNTER — Emergency Department (HOSPITAL_COMMUNITY)
Admission: EM | Admit: 2023-10-30 | Discharge: 2023-10-30 | Disposition: A | Discharge status: Home / Self Care | Attending: Emergency Medicine | Admitting: Emergency Medicine

## 2023-10-30 ENCOUNTER — Telehealth: Payer: Self-pay | Admitting: Student

## 2023-10-30 ENCOUNTER — Encounter (HOSPITAL_COMMUNITY): Payer: Self-pay

## 2023-10-30 ENCOUNTER — Other Ambulatory Visit: Payer: Self-pay

## 2023-10-30 DIAGNOSIS — E039 Hypothyroidism, unspecified: Secondary | ICD-10-CM | POA: Insufficient documentation

## 2023-10-30 DIAGNOSIS — R7989 Other specified abnormal findings of blood chemistry: Secondary | ICD-10-CM | POA: Diagnosis not present

## 2023-10-30 DIAGNOSIS — R413 Other amnesia: Secondary | ICD-10-CM

## 2023-10-30 DIAGNOSIS — R269 Unspecified abnormalities of gait and mobility: Secondary | ICD-10-CM | POA: Diagnosis present

## 2023-10-30 DIAGNOSIS — R42 Dizziness and giddiness: Secondary | ICD-10-CM

## 2023-10-30 DIAGNOSIS — Z87891 Personal history of nicotine dependence: Secondary | ICD-10-CM | POA: Diagnosis not present

## 2023-10-30 DIAGNOSIS — I10 Essential (primary) hypertension: Secondary | ICD-10-CM | POA: Diagnosis not present

## 2023-10-30 DIAGNOSIS — R4701 Aphasia: Secondary | ICD-10-CM | POA: Diagnosis not present

## 2023-10-30 DIAGNOSIS — I639 Cerebral infarction, unspecified: Secondary | ICD-10-CM | POA: Diagnosis present

## 2023-10-30 DIAGNOSIS — Z79899 Other long term (current) drug therapy: Secondary | ICD-10-CM | POA: Diagnosis not present

## 2023-10-30 DIAGNOSIS — Z8673 Personal history of transient ischemic attack (TIA), and cerebral infarction without residual deficits: Secondary | ICD-10-CM | POA: Insufficient documentation

## 2023-10-30 LAB — COMPREHENSIVE METABOLIC PANEL WITH GFR
ALT: 18 U/L (ref 0–44)
AST: 22 U/L (ref 15–41)
Albumin: 3.4 g/dL — ABNORMAL LOW (ref 3.5–5.0)
Alkaline Phosphatase: 54 U/L (ref 38–126)
Anion gap: 10 (ref 5–15)
BUN: 25 mg/dL — ABNORMAL HIGH (ref 8–23)
CO2: 27 mmol/L (ref 22–32)
Calcium: 8.6 mg/dL — ABNORMAL LOW (ref 8.9–10.3)
Chloride: 102 mmol/L (ref 98–111)
Creatinine, Ser: 1.25 mg/dL — ABNORMAL HIGH (ref 0.44–1.00)
GFR, Estimated: 40 mL/min — ABNORMAL LOW (ref >60)
Glucose, Bld: 143 mg/dL — ABNORMAL HIGH (ref 70–99)
Potassium: 4 mmol/L (ref 3.5–5.1)
Sodium: 139 mmol/L (ref 135–145)
Total Bilirubin: 0.7 mg/dL (ref 0.0–1.2)
Total Protein: 6.1 g/dL — ABNORMAL LOW (ref 6.5–8.1)

## 2023-10-30 LAB — URINALYSIS, W/ REFLEX TO CULTURE (INFECTION SUSPECTED)
Bacteria, UA: NONE SEEN
Bilirubin Urine: NEGATIVE
Glucose, UA: NEGATIVE mg/dL
Hgb urine dipstick: NEGATIVE
Ketones, ur: NEGATIVE mg/dL
Leukocytes,Ua: NEGATIVE
Nitrite: NEGATIVE
Protein, ur: NEGATIVE mg/dL
Specific Gravity, Urine: 1.028 (ref 1.005–1.030)
pH: 6 (ref 5.0–8.0)

## 2023-10-30 LAB — CBC
HCT: 42 % (ref 36.0–46.0)
Hemoglobin: 13.9 g/dL (ref 12.0–15.0)
MCH: 32.9 pg (ref 26.0–34.0)
MCHC: 33.1 g/dL (ref 30.0–36.0)
MCV: 99.5 fL (ref 80.0–100.0)
Platelets: 209 10*3/uL (ref 150–400)
RBC: 4.22 MIL/uL (ref 3.87–5.11)
RDW: 12.6 % (ref 11.5–15.5)
WBC: 7.2 10*3/uL (ref 4.0–10.5)
nRBC: 0 % (ref 0.0–0.2)

## 2023-10-30 LAB — DIFFERENTIAL
Abs Immature Granulocytes: 0.05 10*3/uL (ref 0.00–0.07)
Basophils Absolute: 0.1 10*3/uL (ref 0.0–0.1)
Basophils Relative: 1 %
Eosinophils Absolute: 0.3 10*3/uL (ref 0.0–0.5)
Eosinophils Relative: 4 %
Immature Granulocytes: 1 %
Lymphocytes Relative: 24 %
Lymphs Abs: 1.7 10*3/uL (ref 0.7–4.0)
Monocytes Absolute: 1.1 10*3/uL — ABNORMAL HIGH (ref 0.1–1.0)
Monocytes Relative: 15 %
Neutro Abs: 4.1 10*3/uL (ref 1.7–7.7)
Neutrophils Relative %: 55 %

## 2023-10-30 LAB — I-STAT CHEM 8, ED
BUN: 29 mg/dL — ABNORMAL HIGH (ref 8–23)
Calcium, Ion: 1.11 mmol/L — ABNORMAL LOW (ref 1.15–1.40)
Chloride: 100 mmol/L (ref 98–111)
Creatinine, Ser: 1.2 mg/dL — ABNORMAL HIGH (ref 0.44–1.00)
Glucose, Bld: 141 mg/dL — ABNORMAL HIGH (ref 70–99)
HCT: 42 % (ref 36.0–46.0)
Hemoglobin: 14.3 g/dL (ref 12.0–15.0)
Potassium: 4 mmol/L (ref 3.5–5.1)
Sodium: 140 mmol/L (ref 135–145)
TCO2: 28 mmol/L (ref 22–32)

## 2023-10-30 LAB — ETHANOL: Alcohol, Ethyl (B): <15 mg/dL (ref <15)

## 2023-10-30 LAB — APTT: aPTT: 25 s (ref 24–36)

## 2023-10-30 LAB — PROTIME-INR
INR: 1 (ref 0.8–1.2)
Prothrombin Time: 13.8 s (ref 11.4–15.2)

## 2023-10-30 LAB — CBG MONITORING, ED: Glucose-Capillary: 141 mg/dL — ABNORMAL HIGH (ref 70–99)

## 2023-10-30 MED ORDER — LACTATED RINGERS IV BOLUS
1000.0000 mL | Freq: Once | INTRAVENOUS | Status: AC
  Administered 2023-10-30: 1000 mL via INTRAVENOUS

## 2023-10-30 MED ORDER — SODIUM CHLORIDE 0.9% FLUSH
3.0000 mL | Freq: Once | INTRAVENOUS | Status: AC
  Administered 2023-10-30: 3 mL via INTRAVENOUS

## 2023-10-30 MED ORDER — IOPAMIDOL (ISOVUE-370) INJECTION 76%
75.0000 mL | Freq: Once | INTRAVENOUS | Status: AC | PRN
  Administered 2023-10-30: 75 mL via INTRAVENOUS

## 2023-10-30 NOTE — Code Documentation (Signed)
 Stroke Response Nurse Documentation Code Documentation  Autumn Allen is a 89 y.o. female arriving to [facility]  via [facility] on 10/30/23  with past medical hx of TIA, L MCA stroke, arthritis, HT, HLD, sleep apnea on CPAP and aortic stenosis. On clopidogrel  75 mg daily. Code stroke was activated by EMS.   Patient from assisted living facility where she was LKW at 1200 and now complaining of dizziness and aphasia . Patient was being visited by family member who stated that she was in her normal state of health at noon and then at 1230, she was walking and suddenly became dizzy and had to sit down.  She then became disoriented and began asking her family member the same question multiple times with difficulty getting words out. EMS was called.   Stroke team at the bedside on patient arrival. Labs drawn and patient cleared for CT . Patient to CT with team. NIHSS 1, see documentation for details and code stroke times.  The following imaging was completed:  CT Head and CTA. Patient is not a candidate for IV Thrombolytic due to symptoms mild/improving. Patient is not a candidate for IR due to no LVO.   Care Plan: q50min neuro and vitals until outside window (1630).    Bedside handoff with ED RN Oakley Bellman.    Pat Bonier  Stroke Response RN

## 2023-10-30 NOTE — Discharge Instructions (Addendum)
 Autumn Allen:  Thank you for allowing us  to take care of you today.  We hope you begin feeling better soon. You were seen today for gait disturbance, speech difficulty.  You had extensive workup for stroke.  There is not appear to be an acute stroke.  We feel likely explanation is decrease in blood pressure which had return of your prior stroke symptoms.  Your blood pressure returned to normal here after fluids.  Please increase oral intake to alleviate the symptoms.  To-Do:  Please follow-up with your primary doctor within the next 2-3 days. It is important that you review any labs or imaging results (if any) that you had today with them. Your preliminary imaging results (if any) are attached. Please return to the Emergency Department or call 911 if you experience chest pain, shortness of breath, severe pain, severe fever, altered mental status, or have any reason to think that you need emergency medical care.  Thank you again.  Hope you feel better soon.  Arminda Landmark, MD Department of Emergency Medicine

## 2023-10-30 NOTE — ED Notes (Signed)
 Nt called [facility] to put pt on monitor

## 2023-10-30 NOTE — ED Provider Notes (Signed)
 [place] EMERGENCY DEPARTMENT AT [facility] Provider Note  History  Chief Complaint:  Code Stroke and Hypotension  The history is provided by the patient, the EMS personnel and a caregiver.     Autumn Allen is a 89 y.o. female with a history of arthritis, TIA, CVA who presents the emergency department due to concerns for stroke.  Per daughter they state that the patient began having UTI symptoms on Friday.  Had increased sleep yesterday.  Today they noticed the patient began to have difficulty speaking around 1230.  Was normal at 12 PM.  POA presented the emergency department who provided collateral history.  She states that the patient was normal around 12 noon.  She was taken the patient to lunch when the patient had to sit down because she felt dizzy.  She was unable describe the dizziness.  After she sat down she attempted to get up and walk again however was too weak.  At that time the patient was having some difficulty speaking and could not elaborate on what was going on.  Therefore they called EMS.  Past Medical History:  Diagnosis Date   Allergy    Aortic stenosis    a. mild to mod by Echo 07/2012   Arthritis    Asymptomatic varicose veins    Benign paroxysmal vertigo    Bradycardia, unspecified    Bronchitis, not specified as acute or chronic    Cardiac murmur    Cataracts, bilateral    Chest pain, unspecified    Chronic cystitis    Depression    Diarrhea    Dyspnea    Dysuria    Frequency of micturition    Frequent urination    Gait disturbance 06/10/2014   Gastric ulcer    Hearing loss    Hemorrhoids    History of bone density study    History of colonoscopy    History of frequent urinary tract infections    History of urinary tract infection    Hx of cardiovascular stress test    a. ETT-Echo 3/12:  EF 60%, normal study   Hx of echocardiogram    a. Echo 2/14:  Mild LVH, EF 60-65%, Gr 1 diast dysfn, mild to mod AS, mean 17 mmHg, AVA 1.3  (VTI), trivial MR, mild LAE, PASP 31    Hyperlipemia    Hypertension    Hypothyroid    Left knee pain    Leg pain    Low back pain    Obstructive sleep apnea    OSA on CPAP    Other disorders of intestinal carbohydrate absorption    Other type of osteoarthritis, unspecified site    Overweight    Pain in thoracic spine    Palpitations    a. event monitor 3/14:  NSR, sinus brady   Plantar fasciitis    Plantar fasciitis    Right shoulder pain    S/P cholecystectomy    S/P TAH-BSO    Sleep apnea    Stroke (HCC)    tia 2016   TIA (transient ischemic attack)    Varicose veins of unspecified lower extremity with inflammation     Past Surgical History:  Procedure Laterality Date   ABDOMINAL HYSTERECTOMY     CHOLECYSTECTOMY     EP IMPLANTABLE DEVICE N/A 06/29/2016   Procedure: Loop Recorder Insertion;  Surgeon: Will Cortland Ding, MD;  Location: [facility] IN[facility];  Service: Cardiovascular;  Laterality: N/A;   FOOT SURGERY  1955  INCONTINENCE SURGERY     LOOP RECORDER REMOVAL N/A 08/10/2017   Procedure: LOOP RECORDER REMOVAL;  Surgeon: Lei Pump, MD;  Location: [facility] INVASIVE CV [facility];  Service: Cardiovascular;  Laterality: N/A;   TONSILLECTOMY      Family History  Problem Relation Age of Onset   Heart attack Mother 74   Heart failure Mother    Arthritis Mother    Lung disease Father    Cancer - Prostate Father    Healthy Brother    Healthy Brother    Heart failure Maternal Uncle    Cancer Son     Social History   Tobacco Use   Smoking status: Former    Types: Cigarettes   Smokeless tobacco: Never  Vaping Use   Vaping status: Never Used  Substance Use Topics   Alcohol  use: Yes    Alcohol /week: 1.0 standard drink of alcohol     Types: 1 Glasses of wine per week    Comment: wine sometimes   Drug use: No    Review of Systems  Review of Systems   Reviewed and documented in HPI if pertinent.   Physical Exam   Vitals:   10/30/23 1830 10/30/23 1900   BP: 93/73 (!) 152/41  Pulse:  (!) 37  Resp: 18 (!) 24  Temp:    SpO2:  94%    Physical Exam Vitals and nursing note reviewed.  Constitutional:      General: She is not in acute distress.    Appearance: She is well-developed.  HENT:     Head: Normocephalic and atraumatic.  Eyes:     Conjunctiva/sclera: Conjunctivae normal.  Cardiovascular:     Rate and Rhythm: Normal rate and regular rhythm.     Heart sounds: No murmur heard. Pulmonary:     Effort: Pulmonary effort is normal. No respiratory distress.     Breath sounds: Normal breath sounds.  Abdominal:     Palpations: Abdomen is soft.     Tenderness: There is no abdominal tenderness.  Musculoskeletal:        General: No swelling.     Cervical back: Neck supple.  Skin:    General: Skin is warm and dry.     Capillary Refill: Capillary refill takes less than 2 seconds.  Neurological:     Mental Status: She is alert.  Psychiatric:        Mood and Affect: Mood normal.     Neuro Exam  Mental Status Exam Alert and oriented Memory appropriate  Speech Speech is clear No dysarthria Language is normal  Cranial Nerves CN II: Visual fields intact to confrontation CN III, IV, VI: PERRL, EOMI, No nystagmus CN V: Facial sensation is normal and equal CN VII: No facial weakness or asymmetry CN VIII: Auditory acuity grossly normal CN IX and X: Uvula is midline, palate elevates symmetrically CN XI: Normal sternocleidomastoid and equal strength CN XII: The tongue is midline, no tongue atrophy or fasciculations  Motor Muscle Strength RUE: 5/5 flexion and extension RLE: 5/5 flexion and extension LUE: 5/5 flexion and extension LLE: 5/5 flexion and extension No pronation or drift  Muscle Tone Normal bulk and tone  Coordination Intact finger-to-nose No tremor Negative Romberg  Sensation Intact to light touch  Gait Routine gait normal   Procedures   Procedures  ED Course - Medical Decision Making  Brief  Overview Autumn Allen is a 89 y.o. female who presents as per above.  I have reviewed the nursing documentation for past  medical history, family history, and social history and agree.  I have reviewed the patient's vital signs. There are no abnormalities.  Initial Differential Diagnoses: I am primarily concerned for stroke, hypovolemia, dehydration, electrolyte abnormalities, anemia.  Therapies: These medications and interventions were provided for the patient while in the ED.  Medications  sodium chloride  flush (NS) 0.9 % injection 3 mL (3 mLs Intravenous Given 10/30/23 1551)  iopamidol (ISOVUE-370) 76 % injection 75 mL (75 mLs Intravenous Contrast Given 10/30/23 1518)  lactated ringers bolus 1,000 mL (1,000 mLs Intravenous New Bag/Given 10/30/23 1551)    Testing Results: On my interpretation labs are significant for : Elevated creatinine UA without infection Leukocytosis  On my interpretation imaging is significant for: MRI without acute infarct CTA without acute findings  EKG Interpretation Date/Time:  Sunday Oct 30 2023 15:31:35 EDT Ventricular Rate:  39 PR Interval:  177 QRS Duration:  99 QT Interval:  508 QTC Calculation: 410 R Axis:   67  Text Interpretation: Sinus bradycardia Atrial premature complex Anteroseptal infarct, age indeterminate not terribly different from prior Confirmed by Early Glisson ([id number]) on 10/30/2023 3:34:22 PM   See the EMR for full details regarding lab and imaging results.   Medical Decision Making Autumn Allen is a 89 y.o. female with pertinent PMHX as above who presents w/ gait and speech disturbance, concerning for a stroke.  The patient was quickly assessed by myself and the attending. They were protecting/maintaining their own airway. The stroke team was emergently consulted. A through neurological exam was performed, and pertinent findings include normal strength and sensation, able to name all objects, no facial droop. Labs  performed and resulted above. Pertinent lab findings include Mildly elevated creatinine, UA without infection.   CT scanner was made available and the patient was rapidly transported. Head CT full report above, but in summary revealed no acute intracranial abnormality.  CTA without acute abnormality.  MRI reveals no evidence of acute infarct  Given there is no acute infarct on imaging.  Mildly elevated creatinine.  There is no evidence of infection.  Likely this is recrudescence in the setting of mild hypovolemia.  Patient did have decreased blood pressure on arrival.  After fluids patient did have a normal blood pressure.  I suspect that she was mildly dehydrated in the in the setting of her old stroke had recurrence of symptoms.  She did not have recurrence of symptoms after her blood pressure was normal.  I discussed with the POA at the bedside regarding diagnosis and plan of treatment.  She was agreeable with this plan.  Patient was discharged in hemodynamically stable condition.  Problems Addressed: Gait disturbance: acute illness or injury that poses a threat to life or bodily functions  Amount and/or Complexity of Data Reviewed Labs: ordered. Radiology: ordered.     ### All radiography studies, electrocardiograms, and laboratory data were personally reviewed by me and incorporated into my medical decision making. Impression   1. Gait disturbance      Note: Chief Executive Officer was used in the creation of this note.     Arminda Landmark, MD 10/30/23 Autumn Allen    Early Glisson, MD 10/30/23 [phone]

## 2023-10-30 NOTE — Telephone Encounter (Signed)
 Received call from nurse at AL that patient started having stroke-like symptoms today. Slurred speech and increased confusion. Hx of TIAs. Family and patient communicate concern that she would like to go to the hospital. Advised nurse to call 911 for transport.

## 2023-10-30 NOTE — Consult Note (Signed)
 NEUROLOGY CONSULT NOTE   Date of service: Oct 30, 2023 Patient Name: Autumn Allen MRN:  [id number] DOB:  November 29, 1929 Chief Complaint: "Dizziness, aphasia and altered mental status" Requesting Provider: Early Glisson, MD  History of Present Illness  Autumn Allen is a 89 y.o. female with hx of TIA, left MCA stroke, short and long-term memory loss, sleep apnea on CPAP, aortic stenosis, hypertension and hyperlipidemia who presents from her assisted living facility with sudden onset dizziness, aphasia and altered mental status.  Patient was being visited by family member who stated that she was in her normal state of health at noon and then at 1230, she was walking and suddenly became dizzy and had to sit down.  She then became disoriented and began asking her family member the same question multiple times.  She had some difficulty getting her words out at that point, and she and the family member returned to her room so that she could be evaluated.  Given symptoms, they decided to call EMS.  Upon arrival, patient was noted to be bradycardic with heart rate in the high 30s to low 40s.  She was alert and oriented x 4 and denied dizziness.  She had some transient hypotension with systolic blood pressure in the 80s but remained alert and oriented during this time, and blood pressure recovered spontaneously.  Per family, patient is able to perform ADLs independently but requires assistance with things like taking her medications and getting to appointments.  LKW: 1200 Modified rankin score: 2-Slight disability-UNABLE to perform all activities but does not need assistance IV Thrombolysis: No, symptoms resolved EVT: No, no LVO  NIHSS components Score: Comment  1a Level of Conscious 0[x]  1[]  2[]  3[]      1b LOC Questions 0[x]  1[]  2[]       1c LOC Commands 0[x]  1[]  2[]       2 Best Gaze 0[x]  1[]  2[]       3 Visual 0[]  1[x]  2[]  3[]      4 Facial Palsy 0[x]  1[]  2[]  3[]      5a Motor Arm - left 0[x]  1[]   2[]  3[]  4[]  UN[]    5b Motor Arm - Right 0[x]  1[]  2[]  3[]  4[]  UN[]    6a Motor Leg - Left 0[x]  1[]  2[]  3[]  4[]  UN[]    6b Motor Leg - Right 0[x]  1[]  2[]  3[]  4[]  UN[]    7 Limb Ataxia 0[x]  1[]  2[]  UN[]      8 Sensory 0[x]  1[]  2[]  UN[]      9 Best Language 0[x]  1[]  2[]  3[]      10 Dysarthria 0[x]  1[]  2[]  UN[]      11 Extinct. and Inattention 0[x]  1[]  2[]       TOTAL:1       ROS  Comprehensive ROS performed and pertinent positives documented in HPI   Past History   Past Medical History:  Diagnosis Date   Allergy    Aortic stenosis    a. mild to mod by Echo 07/2012   Arthritis    Asymptomatic varicose veins    Benign paroxysmal vertigo    Bradycardia, unspecified    Bronchitis, not specified as acute or chronic    Cardiac murmur    Cataracts, bilateral    Chest pain, unspecified    Chronic cystitis    Depression    Diarrhea    Dyspnea    Dysuria    Frequency of micturition    Frequent urination    Gait disturbance 06/10/2014   Gastric ulcer    Hearing loss  Hemorrhoids    History of bone density study    History of colonoscopy    History of frequent urinary tract infections    History of urinary tract infection    Hx of cardiovascular stress test    a. ETT-Echo 3/12:  EF 60%, normal study   Hx of echocardiogram    a. Echo 2/14:  Mild LVH, EF 60-65%, Gr 1 diast dysfn, mild to mod AS, mean 17 mmHg, AVA 1.3 (VTI), trivial MR, mild LAE, PASP 31    Hyperlipemia    Hypertension    Hypothyroid    Left knee pain    Leg pain    Low back pain    Obstructive sleep apnea    OSA on CPAP    Other disorders of intestinal carbohydrate absorption    Other type of osteoarthritis, unspecified site    Overweight    Pain in thoracic spine    Palpitations    a. event monitor 3/14:  NSR, sinus brady   Plantar fasciitis    Plantar fasciitis    Right shoulder pain    S/P cholecystectomy    S/P TAH-BSO    Sleep apnea    Stroke (HCC)    tia 2016   TIA (transient ischemic attack)     Varicose veins of unspecified lower extremity with inflammation     Past Surgical History:  Procedure Laterality Date   ABDOMINAL HYSTERECTOMY     CHOLECYSTECTOMY     EP IMPLANTABLE DEVICE N/A 06/29/2016   Procedure: Loop Recorder Insertion;  Surgeon: Will Cortland Ding, MD;  Location: [facility];  Service: Cardiovascular;  Laterality: N/A;   FOOT SURGERY  1955   INCONTINENCE SURGERY     LOOP RECORDER REMOVAL N/A 08/10/2017   Procedure: LOOP RECORDER REMOVAL;  Surgeon: Lei Pump, MD;  Location: [facility];  Service: Cardiovascular;  Laterality: N/A;   TONSILLECTOMY      Family History: Family History  Problem Relation Age of Onset   Heart attack Mother 7   Heart failure Mother    Arthritis Mother    Lung disease Father    Cancer - Prostate Father    Healthy Brother    Healthy Brother    Heart failure Maternal Uncle    Cancer Son     Social History  reports that she has quit smoking. Her smoking use included cigarettes. She has never used smokeless tobacco. She reports current alcohol  use of about 1.0 standard drink of alcohol  per week. She reports that she does not use drugs.  Allergies  Allergen Reactions   Bee Venom Other (See Comments)    Unknown reaction Per facility    Cipro [Ciprofloxacin Hcl] Other (See Comments)    Unknown reaction  Per facility   Crestor [Rosuvastatin] Other (See Comments)    Myalgias    Lodine [Etodolac] Other (See Comments)    Unknown reaction Per facility   Naprosyn [Naproxen] Other (See Comments)    Unknown reaction Per facility   Sulfonamide Derivatives Other (See Comments)    Unknown reaction Per facility   Tositumomab    Epipen  [Epinephrine ] Palpitations   Macrobid [Nitrofurantoin Macrocrystal] Rash   Metrocream [Metronidazole] Rash   Xanax [Alprazolam] Rash    Medications   Current Facility-Administered Medications:    lactated ringers bolus 1,000 mL, 1,000 mL, Intravenous, Once, Colbaugh,  Weston, MD   sodium chloride  flush (NS) 0.9 % injection 3 mL, 3 mL, Intravenous, Once, Early Glisson, MD  Current Outpatient Medications:    acetaminophen  (TYLENOL ) 500 MG tablet, Take 1 tablet (500 mg total) by mouth every 8 (eight) hours as needed for moderate pain., Disp: 30 tablet, Rfl: 0   albuterol (PROVENTIL) (2.5 MG/3ML) 0.083% nebulizer solution, Take 2.5 mg by nebulization every 6 (six) hours as needed for wheezing or shortness of breath., Disp: , Rfl:    atorvastatin  (LIPITOR) 40 MG tablet, Take 1 tablet (40 mg total) by mouth daily., Disp: 30 tablet, Rfl: 1   clopidogrel  (PLAVIX ) 75 MG tablet, Take 1 tablet (75 mg total) by mouth daily., Disp: 30 tablet, Rfl: 3   escitalopram  (LEXAPRO ) 10 MG tablet, Take 15 mg by mouth at bedtime., Disp: , Rfl:    felodipine  (PLENDIL ) 5 MG 24 hr tablet, Take 5 mg by mouth daily., Disp: , Rfl:    GUAIFENESIN PO, Take 10 mLs by mouth every 6 (six) hours as needed., Disp: , Rfl:    hydrocortisone  (ANUSOL -HC) 2.5 % rectal cream, Place 1 Application rectally 2 (two) times daily., Disp: , Rfl:    indapamide  (LOZOL ) 2.5 MG tablet, Take 2.5 mg by mouth daily., Disp: , Rfl:    KETOCONAZOLE, TOPICAL, 1 % SHAM, Apply 1 Application topically 2 (two) times a week. Tuesday & Friday, Disp: , Rfl:    Lactobacillus-Inulin (CULTURELLE DIGESTIVE HEALTH PO), Take 500 mg by mouth as needed (for loose stool or antibiotic use twice daily as needed.)., Disp: , Rfl:    levothyroxine  (SYNTHROID , LEVOTHROID) 50 MCG tablet, Take 50 mcg by mouth in the morning., Disp: , Rfl:    melatonin 5 MG TABS, Take 5 mg by mouth daily., Disp: , Rfl:    memantine  (NAMENDA ) 5 MG tablet, Take 1 tablet (5 mg total) by mouth 2 (two) times daily. Start with 5mg  daily for 1 week and then increase to 5mg  BID, Disp: 60 tablet, Rfl: 5   polyethylene glycol (MIRALAX / GLYCOLAX) 17 g packet, Take 17 g by mouth daily., Disp: , Rfl:    traZODone  (DESYREL ) 50 MG tablet, Take 50 mg by mouth daily., Disp: ,  Rfl:   Vitals   Vitals:   10/30/23 1500 10/30/23 1519 10/30/23 1527 10/30/23 1530  BP: (!) 83/37 (!) 83/48 (!) 156/46 (!) 135/44  Pulse: (!) 41 (!) 45  (!) 37  Resp: 16 18  16   Temp:  97.8 F (36.6 C) 97.7 F (36.5 C)   TempSrc:  Oral Oral   SpO2: 95% 97%  91%  Weight: 82.2 kg 78 kg    Height:  5\' 5"  (1.651 m)      Body mass index is 28.62 kg/m.  Physical Exam   Constitutional: Appears well-developed and well-nourished.  Psych: Affect appropriate to situation.  Eyes: No scleral injection.  HENT: No OP obstruction.  Head: Normocephalic.  Cardiovascular: Bradycardic, irregular rate on the monitor Respiratory: Effort normal, non-labored breathing.  Skin: WDI.   Neurologic Examination    NEURO:  Mental Status: AA&Ox3, able to give some history of current illness Speech/Language: speech is without dysarthria or aphasia.  Naming, fluency, and comprehension intact.  Cranial Nerves:  II: PERRL.  Right lower quadrantanopsia originally seen but not present on exam later III, IV, VI: EOMI. Eyelids elevate symmetrically.  V: Sensation is intact to light touch and symmetrical to face.  VII: Smile is symmetrical.  VIII: hearing intact to voice. IX, X: Phonation is normal.  XII: tongue is midline without fasciculations. Motor: Able to move all 4 extremities with good antigravity strength Tone:  is normal and bulk is normal Sensation- Intact to light touch bilaterally. Extinction absent to light touch to DSS.   Coordination: FTN intact bilaterally, HKS: no ataxia in BLE. Gait- deferred   Labs/Imaging/Neurodiagnostic studies   CBC:  Recent Labs  Lab 11-23-23 1500 November 23, 2023 1502  WBC 7.2  --   NEUTROABS 4.1  --   HGB 13.9 14.3  HCT 42.0 42.0  MCV 99.5  --   PLT 209  --    Basic Metabolic Panel:  Lab Results  Component Value Date   NA 140 2023-11-23   K 4.0 11-23-23   CO2 27 Nov 23, 2023   GLUCOSE 141 (H) 2023-11-23   BUN 29 (H) 2023/11/23   CREATININE 1.20 (H)  11/23/23   CALCIUM  8.6 (L) 11-23-2023   GFRNONAA 40 (L) 11-23-23   GFRAA >60 06/18/2019   Lipid Panel:  Lab Results  Component Value Date   LDLCALC 47 11/20/2022   HgbA1c:  Lab Results  Component Value Date   HGBA1C 6.7 11/23/2022   Alcohol  Level     Component Value Date/Time   [facility] <15 11/23/23 1458   INR  Lab Results  Component Value Date   INR 1.0 Nov 23, 2023   APTT  Lab Results  Component Value Date   APTT 25 11/23/2023    CT Head without contrast(Personally reviewed): No acute abnormality, atrophy and chronic small vessel ischemic disease  CT angio Head and Neck with contrast(Personally reviewed): Occluded right PCA branch at the P2/P3 junction, new from prior CT on 11/23, moderate to severe stenosis within left M2 MCA vessel  MRI Brain(Personally reviewed): Pending  ASSESSMENT   Autumn Allen is a 89 y.o. female with hx of TIA, left MCA stroke, short and long-term memory loss, sleep apnea on CPAP, aortic stenosis, hypertension and hyperlipidemia who presents with sudden onset dizziness, aphasia and altered mental status.  Patient was walking and first became dizzy, sat down and began asking a family member who was with her the same question multiple times and had some difficulty getting her words out.  Symptoms began to resolve within a few minutes, and patient was alert and oriented x 4 upon arrival to the ED.  She was noted to be bradycardic and hypotensive while in the ED, but became normotensive without intervention.  She did have a right lower quadrantanopsia initially on examination, but this was not apparent on repeat exam.  While stroke or TIA is definitely on the differential, it is worthwhile to note that patient has a bradycardic and irregular rate on the monitor as well as known aortic stenosis, making cardiac cause of her symptoms a definite possibility.  Toxic metabolic encephalopathy in the setting of UTI or other infection is lower on the  differential as patient is afebrile without leukocytosis.  RECOMMENDATIONS  -Brain MRI, commence stroke workup if positive - Workup of cardiac and toxic metabolic causes of patient's symptoms per primary team ______________________________________________________________________  Patient seen by NP with MD, MD to edit note as needed.  Signed, Cortney E Bucky Cardinal, NP [facility]    Addendum MRI of the brain has been completed-preliminary review negative for acute stroke. Since there is no acute stroke and she has been worked up for stroke in the past few months, no need for stroke/TIA workup Due to repetitive questioning, another differential to consider would be transient global amnesia (TGA)-- I would recommend getting an EEG if she is being admitted.  If she is not being admitted, EEG can be  done as outpatient with formal neuropsych testing and neurology follow-up. She may need cardiac monitoring and repeat echocardiogram for possible lightheadedness/dizziness/presyncope and the bradycardia and hypotension that she came in with. Continue on antiplatelet and statin. If admitted, obtain therapy assessments.  If she gets admitted and the EEG is abnormal or if there are any other questions, neurology will be available as needed.  Inpatient neurology will sign off for now  Attending Neurohospitalist Addendum Patient seen and examined with APP/Resident. Agree with the history and physical as documented above. Agree with the plan as documented, which I helped formulate. I have independently reviewed the chart, obtained history, review of systems and examined the patient.I have personally reviewed pertinent head/neck/spine imaging (CT/MRI). Please feel free to call with any questions.  -- Tona Francis, MD Neurologist [facility] Pager: [phone]

## 2023-10-30 NOTE — ED Triage Notes (Signed)
 Pt bib [facility] from [facility] as a code stroke LKW 1200. Per ems family was visiting patient and she was her baseline normal self a&OX4. Around 1230 patient slowly started having issues with short and long term memory. While walking to cafeteria, patient felt dizzy while ambulating. Upon ems arrival patient had intermittent slurred speech and aphasia.   Pt reports having UTI symptoms that started yesterday. Family reports patient slept all day yesterday.   Pt reports stroke in past.   Hx bradycardia

## 2023-10-30 NOTE — ED Notes (Signed)
 Patient transported to MRI

## 2023-10-31 ENCOUNTER — Encounter: Payer: Self-pay | Admitting: Orthopedic Surgery

## 2023-10-31 NOTE — Progress Notes (Signed)
 Location:  [facility] Room Number: 10/A Place of Service:  ALF [phone]) Provider:  Arnetha Bhat, NP   Marguerite Shiley, MD  Patient Care Team: Marguerite Shiley, MD as PCP - General (Internal Medicine) Lei Pump, MD as PCP - Cardiology (Cardiology) Alvina Axon, MD as Consulting Physician (Ophthalmology) Loa Riling, DO as Consulting Physician (Obstetrics and Gynecology) Dohmeier, Raoul Byes, MD as Consulting Physician (Neurology) Janita Mellow, MD as Consulting Physician (Otolaryngology)  Extended Emergency Contact Information Primary Emergency Contact: [facility] Address: [address]          [place], [place] [address] [place] Home Phone: [phone] Mobile Phone: [phone] Relation: Daughter Secondary Emergency Contact: Gaylen Kay Address: [place], [place]  [place] Home Phone: [phone] Relation: Brother  Code Status:  DNR Goals of care: Advanced Directive information    10/30/2023    3:20 PM  Advanced Directives  Does Patient Have a Medical Advance Directive? Unable to assess, patient is non-responsive or altered mental status     Chief Complaint  Patient presents with   Acute Visit    ED follow up   Error    HPI:  Pt is a 89 y.o. female seen today for medical management of chronic diseases.     Past Medical History:  Diagnosis Date   Allergy    Aortic stenosis    a. mild to mod by Echo 07/2012   Arthritis    Asymptomatic varicose veins    Benign paroxysmal vertigo    Bradycardia, unspecified    Bronchitis, not specified as acute or chronic    Cardiac murmur    Cataracts, bilateral    Chest pain, unspecified    Chronic cystitis    Depression    Diarrhea    Dyspnea    Dysuria    Frequency of micturition    Frequent urination    Gait disturbance 06/10/2014   Gastric ulcer    Hearing loss    Hemorrhoids    History of bone density study    History of colonoscopy     History of frequent urinary tract infections    History of urinary tract infection    Hx of cardiovascular stress test    a. ETT-Echo 3/12:  EF 60%, normal study   Hx of echocardiogram    a. Echo 2/14:  Mild LVH, EF 60-65%, Gr 1 diast dysfn, mild to mod AS, mean 17 mmHg, AVA 1.3 (VTI), trivial MR, mild LAE, PASP 31    Hyperlipemia    Hypertension    Hypothyroid    Left knee pain    Leg pain    Low back pain    Obstructive sleep apnea    OSA on CPAP    Other disorders of intestinal carbohydrate absorption    Other type of osteoarthritis, unspecified site    Overweight    Pain in thoracic spine    Palpitations    a. event monitor 3/14:  NSR, sinus brady   Plantar fasciitis    Plantar fasciitis    Right shoulder pain    S/P cholecystectomy    S/P TAH-BSO    Sleep apnea    Stroke (HCC)    tia 2016   TIA (transient ischemic attack)    Varicose veins of unspecified lower extremity with inflammation    Past Surgical History:  Procedure Laterality Date   ABDOMINAL HYSTERECTOMY     CHOLECYSTECTOMY     EP  IMPLANTABLE DEVICE N/A 06/29/2016   Procedure: Loop Recorder Insertion;  Surgeon: Will Cortland Ding, MD;  Location: [facility];  Service: Cardiovascular;  Laterality: N/A;   FOOT SURGERY  1955   INCONTINENCE SURGERY     LOOP RECORDER REMOVAL N/A 08/10/2017   Procedure: LOOP RECORDER REMOVAL;  Surgeon: Lei Pump, MD;  Location: [facility];  Service: Cardiovascular;  Laterality: N/A;   TONSILLECTOMY      Allergies  Allergen Reactions   Bee Venom Other (See Comments)    Unknown reaction Per facility    Cipro [Ciprofloxacin Hcl] Other (See Comments)    Unknown reaction  Per facility   Crestor [Rosuvastatin] Other (See Comments)    Myalgias    Lodine [Etodolac] Other (See Comments)    Unknown reaction Per facility   Naprosyn [Naproxen] Other (See Comments)    Unknown reaction Per facility   Sulfonamide Derivatives Other (See Comments)     Unknown reaction Per facility   Tositumomab    Epipen  [Epinephrine ] Palpitations   Macrobid [Nitrofurantoin Macrocrystal] Rash   Metrocream [Metronidazole] Rash   Xanax [Alprazolam] Rash    Outpatient Encounter Medications as of 10/31/2023  Medication Sig   acetaminophen  (TYLENOL ) 500 MG tablet Take 1 tablet (500 mg total) by mouth every 8 (eight) hours as needed for moderate pain.   albuterol (PROVENTIL) (2.5 MG/3ML) 0.083% nebulizer solution Take 2.5 mg by nebulization every 6 (six) hours as needed for wheezing or shortness of breath.   atorvastatin  (LIPITOR) 40 MG tablet Take 1 tablet (40 mg total) by mouth daily.   clopidogrel  (PLAVIX ) 75 MG tablet Take 1 tablet (75 mg total) by mouth daily.   escitalopram  (LEXAPRO ) 10 MG tablet Take 15 mg by mouth at bedtime.   felodipine  (PLENDIL ) 5 MG 24 hr tablet Take 5 mg by mouth daily.   GUAIFENESIN PO Take 10 mLs by mouth every 6 (six) hours as needed.   hydrocortisone  (ANUSOL -HC) 2.5 % rectal cream Place 1 Application rectally 2 (two) times daily.   indapamide  (LOZOL ) 2.5 MG tablet Take 2.5 mg by mouth daily.   KETOCONAZOLE, TOPICAL, 1 % SHAM Apply 1 Application topically 2 (two) times a week. Tuesday & Friday   Lactobacillus-Inulin (CULTURELLE DIGESTIVE HEALTH PO) Take 500 mg by mouth as needed (for loose stool or antibiotic use twice daily as needed.).   levothyroxine  (SYNTHROID , LEVOTHROID) 50 MCG tablet Take 50 mcg by mouth in the morning.   melatonin 5 MG TABS Take 5 mg by mouth daily.   memantine  (NAMENDA ) 5 MG tablet Take 1 tablet (5 mg total) by mouth 2 (two) times daily. Start with 5mg  daily for 1 week and then increase to 5mg  BID   polyethylene glycol (MIRALAX / GLYCOLAX) 17 g packet Take 17 g by mouth daily.   traZODone  (DESYREL ) 50 MG tablet Take 50 mg by mouth daily.   No facility-administered encounter medications on file as of 10/31/2023.    Review of Systems  Immunization History  Administered Date(s) Administered   DTaP  09/30/1995   Moderna SARS-COV2 Booster Vaccination 03/25/2020, 11/25/2021   Moderna Sars-Covid-2 Vaccination 07/02/2019, 07/16/2019, 11/19/2020   Respiratory Syncytial Virus Vaccine,Recomb Aduvanted(Arexvy) 06/26/2022   Tdap 06/15/2015   Pertinent  Health Maintenance Due  Topic Date Due   INFLUENZA VACCINE  01/13/2024   DEXA SCAN  Completed      06/25/2022   10:22 AM 06/25/2022    1:53 PM 08/25/2022   12:58 PM 06/27/2023   12:22 PM 10/14/2023  10:55 AM  Fall Risk  Falls in the past year? 0 0 1 1 1   Was there an injury with Fall? 0 0 1 1 1   Fall Risk Category Calculator 0 0 2 2 2   Fall Risk Category (Retired) Low Low     (RETIRED) Patient Fall Risk Level High fall risk High fall risk     Patient at Risk for Falls Due to History of fall(s) History of fall(s);Impaired balance/gait;Impaired mobility History of fall(s);Impaired balance/gait;Impaired mobility History of fall(s);Impaired balance/gait History of fall(s);Impaired balance/gait  Fall risk Follow up Falls evaluation completed Falls evaluation completed;Education provided Falls evaluation completed;Education provided Falls evaluation completed;Education provided Falls evaluation completed;Education provided   Functional Status Survey:    Vitals:   10/31/23 0919  BP: (!) 145/74  Pulse: (!) 43  Resp: 20  Temp: 97.7 F (36.5 C)  SpO2: 92%  Weight: 179 lb 9.6 oz (81.5 kg)  Height: 5\' 5"  (1.651 m)   Body mass index is 29.89 kg/m. Physical Exam  Labs reviewed: Recent Labs    11/20/22 0000 10/30/23 1500 10/30/23 1502  NA 142 139 140  K 3.5 4.0 4.0  CL 103 102 100  CO2 28* 27  --   GLUCOSE  --  143* 141*  BUN 14 25* 29*  CREATININE 0.8 1.25* 1.20*  CALCIUM  8.4* 8.6*  --     Recent Labs    11/20/22 0000 10/30/23 1500  AST 13 22  ALT 7 18  ALKPHOS 52 54  BILITOT  --  0.7  PROT  --  6.1*  ALBUMIN 3.4* 3.4*    Recent Labs    11/20/22 0000 10/30/23 1500 10/30/23 1502  WBC 6.6 7.2  --   NEUTROABS  3,703.00 4.1  --   HGB 13.5 13.9 14.3  HCT 40 42.0 42.0  MCV  --  99.5  --   PLT 215 209  --     Lab Results  Component Value Date   TSH 0.80 11/23/2022   Lab Results  Component Value Date   HGBA1C 6.7 11/23/2022   Lab Results  Component Value Date   CHOL 105 11/20/2022   HDL 39 11/20/2022   LDLCALC 47 11/20/2022   LDLDIRECT 135.1 08/26/2010   TRIG 100 11/20/2022   CHOLHDL 3.9 04/17/2022    Significant Diagnostic Results in last 30 days:  [name] CONTRAST Result Date: 10/30/2023 CLINICAL DATA:  Provided history: Neuro deficit, acute, stroke suspected. EXAM: MRI HEAD WITHOUT CONTRAST TECHNIQUE: Multiplanar, multiecho pulse sequences of the brain and surrounding structures were obtained without intravenous contrast. COMPARISON:  Non-contrast head CT and CT angiogram head/neck performed earlier today 10/30/2023. Brain MRI 04/16/2022. FINDINGS: Brain: Generalized cerebral atrophy. Small chronic left MCA territory infarcts within the left insula, temporal lobe and periatrial white matter, some of which were better appreciated on the prior brain MRI of 04/16/2022 (acute at that time). Chronic lacunar infarct within the left subinsular white matter, new from the prior MRI (series 5, image 15). Background moderate multifocal T2 FLAIR hyperintense signal abnormality within the cerebral white matter, nonspecific but compatible with chronic small vessel ischemic disease. There is no acute infarct. No evidence of an intracranial mass. No chronic intracranial blood products. No extra-axial fluid collection. No midline shift. Vascular: See CTA head/neck performed earlier today. Skull and upper cervical spine: No focal worrisome marrow lesion. Sinuses/Orbits: No mass or acute finding within the imaged orbits. Prior bilateral ocular lens replacement. Redemonstrated chronic, depressed fracture deformity of the right orbital  floor. Minimal mucosal thickening within the bilateral ethmoid sinuses. Other:  Small-volume fluid within right mastoid air cells. IMPRESSION: 1. No acute intracranial finding. 2. Known small chronic left MCA territory infarcts within the left insula, temporal lobe and periatrial white matter. 3. Chronic lacunar infarct within the left subinsular white matter, new from the prior MRI of 04/16/2022. 4. Background moderate cerebral white matter chronic small vessel ischemic disease. 5. Generalized cerebral atrophy. Electronically Signed   By: Bascom Lily D.O.   On: 10/30/2023 18:42   CT ANGIO HEAD NECK W WO CM (CODE STROKE) Addendum Date: 10/30/2023 ADDENDUM REPORT: 10/30/2023 15:56 ADDENDUM: CTA head impression #1 called by telephone at the time of interpretation on 10/30/2023 at 3:45 pm to provider [facility] , who verbally acknowledged these results. Electronically Signed   By: Bascom Lily D.O.   On: 10/30/2023 15:56   Result Date: 10/30/2023 CLINICAL DATA:  Provided history: Neuro deficit, acute, stroke suspected. Dizziness. Aphasia. EXAM: CT ANGIOGRAPHY HEAD AND NECK WITH AND WITHOUT CONTRAST TECHNIQUE: Multidetector CT imaging of the head and neck was performed using the standard protocol during bolus administration of intravenous contrast. Multiplanar CT image reconstructions and MIPs were obtained to evaluate the vascular anatomy. Carotid stenosis measurements (when applicable) are obtained utilizing NASCET criteria, using the distal internal carotid diameter as the denominator. RADIATION DOSE REDUCTION: This exam was performed according to the departmental dose-optimization program which includes automated exposure control, adjustment of the mA and/or kV according to patient size and/or use of iterative reconstruction technique. CONTRAST:  75mL ISOVUE -370 IOPAMIDOL  (ISOVUE -370) INJECTION 76% COMPARISON:  Noncontrast head CT performed earlier today 10/30/2023. CT angiogram head/neck 04/17/2022. FINDINGS: CTA NECK FINDINGS Aortic arch: Standard aortic branching. Atherosclerotic  plaque within the visualized thoracic aorta and proximal major branch vessels of the neck. No hemodynamically significant innominate or proximal subclavian artery stenosis. Right carotid system: CCA and ICA patent within the neck without hemodynamically significant stenosis (50% or greater). Mild-to-moderate atherosclerotic plaque about the carotid bifurcation and within the proximal ICA. Left carotid system: CCA and ICA patent within the neck without hemodynamically significant stenosis (50% or greater). Mild atherosclerotic plaque at the CCA origin and about the carotid bifurcation. Vertebral arteries: Venous reflux of contrast partially obscures left vertebral artery proximal V1 segment. Within this limitation, the vertebral arteries are codominant and patent within the neck without stenosis or significant atherosclerotic disease. Skeleton: Cervical spondylosis. No acute fracture or aggressive osseous lesion. Developmental nonunion of the C1 posterior arch on the right. Other neck: No neck mass or cervical lymphadenopathy. Upper chest: No consolidation within the imaged lung apices. Review of the MIP images confirms the above findings CTA HEAD FINDINGS Anterior circulation: The intracranial internal carotid arteries are patent. Nonstenotic atherosclerotic plaque within both vessels. The M1 middle cerebral arteries are patent. No M2 proximal branch occlusion is identified. Moderate/severe stenosis within a mid M2 left MCA vessel (at site of prior occlusion) (series 12, image 19). The anterior cerebral arteries are patent. Mildly hypoplastic left A1 segment. No intracranial aneurysm is identified. Posterior circulation: The intracranial vertebral arteries are patent. The basilar artery is patent. Occluded right posterior cerebral artery branch at the P2/P3 segment junction, new from the prior CTA of 04/17/2022 (for instance as seen on series 12, image 29). The left posterior cerebral artery is patent. A left  posterior communicating artery is present. The right posterior communicating artery is diminutive or absent. Venous sinuses: Within the limitations of contrast timing, no convincing thrombus. Anatomic variants: As described. Review  of the MIP images confirms the above findings Attempts are being made to reach the ordering provider at this time. IMPRESSION: CTA neck: 1. The common carotid and internal carotid arteries are patent within the neck without hemodynamically significant stenosis (50% or greater). Atherosclerotic plaque bilaterally, as described. 2. Venous reflux of contrast partially obscures the left vertebral artery proximal V1 segment. Within this limitation, the vertebral arteries are patent within the neck without stenosis or significant atherosclerotic disease. 3. Aortic Atherosclerosis (ICD10-I70.0). CTA head: 1. Occluded right posterior cerebral artery branch at the P2/P3 junction, new from the prior CTA of 04/17/2022. 2. Moderate/severe stenosis within a mid M2 left middle cerebral artery branch vessel (at site of prior occlusion). 3. Non-stenotic atherosclerotic plaque within the intracranial internal carotid arteries. Electronically Signed: By: Bascom Lily D.O. On: 10/30/2023 15:44   CT HEAD CODE STROKE WO CONTRAST Result Date: 10/30/2023 CLINICAL DATA:  Code stroke. Provided history: Neuro deficit, acute, stroke suspected. EXAM: CT HEAD WITHOUT CONTRAST TECHNIQUE: Contiguous axial images were obtained from the base of the skull through the vertex without intravenous contrast. RADIATION DOSE REDUCTION: This exam was performed according to the departmental dose-optimization program which includes automated exposure control, adjustment of the mA and/or kV according to patient size and/or use of iterative reconstruction technique. COMPARISON:  Non-contrast head CT and CT angiogram head/neck 04/17/2022. FINDINGS: Brain: Generalized cerebral atrophy. Small chronic infarcts within the left  periatrial white matter. Additional known small chronic left MCA territory infarcts are not well appreciated by CT. Patchy and ill-defined hypoattenuation within the cerebral white matter, nonspecific but compatible with moderate chronic small vessel ischemic disease. There is no acute intracranial hemorrhage. No acute demarcated cortical infarct. No extra-axial fluid collection. No evidence of an intracranial mass. No midline shift. Vascular: No hyperdense vessel.  Atherosclerotic calcifications. Skull: No calvarial fracture or aggressive osseous lesion. Sinuses/Orbits: No mass or acute finding within the imaged orbits. Redemonstrated chronic, depressed fracture deformity of the right orbital floor. No significant inflammatory paranasal sinus disease. ASPECTS [facility] Stroke Program Early CT Score) - Ganglionic level infarction (caudate, lentiform nuclei, internal capsule, insula, M1-M3 cortex): 7 - Supraganglionic infarction (M4-M6 cortex): 3 Total score (0-10 with 10 being normal): 10 (when discounting chronic infarcts). No acute intracranial finding. These results were communicated to Dr. Bonnita Buttner at 3:13 pmon 5/18/2025by text page via the [facility] messaging system. IMPRESSION: 1.  No acute intracranial finding. 2. Parenchymal atrophy, chronic small vessel ischemic disease and chronic infarcts, as described. Electronically Signed   By: Bascom Lily D.O.   On: 10/30/2023 15:13    Assessment/Plan There are no diagnoses linked to this encounter.   Family/ staff Communication:  Labs/tests ordered:    This encounter was created in error - please disregard.

## 2023-11-01 ENCOUNTER — Non-Acute Institutional Stay: Payer: Self-pay | Admitting: Adult Health

## 2023-11-01 ENCOUNTER — Encounter: Payer: Self-pay | Admitting: Adult Health

## 2023-11-01 DIAGNOSIS — I10 Essential (primary) hypertension: Secondary | ICD-10-CM

## 2023-11-01 DIAGNOSIS — F339 Major depressive disorder, recurrent, unspecified: Secondary | ICD-10-CM

## 2023-11-01 DIAGNOSIS — R4189 Other symptoms and signs involving cognitive functions and awareness: Secondary | ICD-10-CM

## 2023-11-01 DIAGNOSIS — E039 Hypothyroidism, unspecified: Secondary | ICD-10-CM

## 2023-11-01 DIAGNOSIS — Z8673 Personal history of transient ischemic attack (TIA), and cerebral infarction without residual deficits: Secondary | ICD-10-CM

## 2023-11-01 DIAGNOSIS — R531 Weakness: Secondary | ICD-10-CM

## 2023-11-01 NOTE — Progress Notes (Unsigned)
 Location:  [facility] Room Number: AL10-A Place of Service:  ALF [phone]) Provider:  Medina-Vargas, Aijalon Demuro, DNP, FNP-BC  Patient Care Team: Marguerite Shiley, MD as PCP - General (Internal Medicine) Lei Pump, MD as PCP - Cardiology (Cardiology) Alvina Axon, MD as Consulting Physician (Ophthalmology) Loa Riling, DO as Consulting Physician (Obstetrics and Gynecology) Dohmeier, Raoul Byes, MD as Consulting Physician (Neurology) Janita Mellow, MD as Consulting Physician (Otolaryngology)  Extended Emergency Contact Information Primary Emergency Contact: [facility] Address: [address]          [place], [place] [address] [place] Home Phone: [phone] Mobile Phone: [phone] Relation: Daughter Secondary Emergency Contact: Gaylen Kay Address: [place]  [place] Home Phone: [phone] Relation: Brother  Code Status:  DNR  Goals of care: Advanced Directive information    10/30/2023    3:20 PM  Advanced Directives  Does Patient Have a Medical Advance Directive? Unable to assess, patient is non-responsive or altered mental status     Chief Complaint  Patient presents with   Hospitalization Follow-up    ED Follow up    HPI:  Pt is a 89 y.o. female seen today for ED Follow up. She is a resident of [facility] [address] [facility] who was transferred to  ED on 10/30/23 due to weakness and difficulty speaking.  Labs were significant for elevated creatinine, urinalysis was negative for infection.  MRI without acute infarct and CTA without acute findings.  She was given 1 L IV LR and was sent back to [facility].  She was seen in her room today. She woke up upon verbal greetings and was able to sit up without any difficulty.  She denies pain.   Past Medical History:  Diagnosis Date   Allergy    Aortic stenosis    a. mild to mod by Echo 07/2012   Arthritis    Asymptomatic varicose veins     Benign paroxysmal vertigo    Bradycardia, unspecified    Bronchitis, not specified as acute or chronic    Cardiac murmur    Cataracts, bilateral    Chest pain, unspecified    Chronic cystitis    Depression    Diarrhea    Dyspnea    Dysuria    Frequency of micturition    Frequent urination    Gait disturbance 06/10/2014   Gastric ulcer    Hearing loss    Hemorrhoids    History of bone density study    History of colonoscopy    History of frequent urinary tract infections    History of urinary tract infection    Hx of cardiovascular stress test    a. ETT-Echo 3/12:  EF 60%, normal study   Hx of echocardiogram    a. Echo 2/14:  Mild LVH, EF 60-65%, Gr 1 diast dysfn, mild to mod AS, mean 17 mmHg, AVA 1.3 (VTI), trivial MR, mild LAE, PASP 31    Hyperlipemia    Hypertension    Hypothyroid    Left knee pain    Leg pain    Low back pain    Obstructive sleep apnea    OSA on CPAP    Other disorders of intestinal carbohydrate absorption    Other type of osteoarthritis, unspecified site    Overweight    Pain in thoracic spine    Palpitations    a. event monitor 3/14:  NSR, sinus brady   Plantar  fasciitis    Plantar fasciitis    Right shoulder pain    S/P cholecystectomy    S/P TAH-BSO    Sleep apnea    Stroke [facility])    tia 2016   TIA (transient ischemic attack)    Varicose veins of unspecified lower extremity with inflammation    Past Surgical History:  Procedure Laterality Date   ABDOMINAL HYSTERECTOMY     CHOLECYSTECTOMY     EP IMPLANTABLE DEVICE N/A 06/29/2016   Procedure: Loop Recorder Insertion;  Surgeon: Will Cortland Ding, MD;  Location: [facility] INVASIVE CV LAB;  Service: Cardiovascular;  Laterality: N/A;   FOOT SURGERY  1955   INCONTINENCE SURGERY     LOOP RECORDER REMOVAL N/A 08/10/2017   Procedure: LOOP RECORDER REMOVAL;  Surgeon: Lei Pump, MD;  Location: [facility] INVASIVE CV LAB;  Service: Cardiovascular;  Laterality: N/A;   TONSILLECTOMY       Allergies  Allergen Reactions   Bee Venom Other (See Comments)    Unknown reaction Per facility    Cipro [Ciprofloxacin Hcl] Other (See Comments)    Unknown reaction  Per facility   Crestor [Rosuvastatin] Other (See Comments)    Myalgias    Lodine [Etodolac] Other (See Comments)    Unknown reaction Per facility   Naprosyn [Naproxen] Other (See Comments)    Unknown reaction Per facility   Sulfonamide Derivatives Other (See Comments)    Unknown reaction Per facility   Tositumomab    Epipen  [Epinephrine ] Palpitations   Macrobid [Nitrofurantoin Macrocrystal] Rash   Metrocream [Metronidazole] Rash   Xanax [Alprazolam] Rash    Outpatient Encounter Medications as of 11/01/2023  Medication Sig   acetaminophen  (TYLENOL ) 500 MG tablet Take 1 tablet (500 mg total) by mouth every 8 (eight) hours as needed for moderate pain.   albuterol (PROVENTIL) (2.5 MG/3ML) 0.083% nebulizer solution Take 2.5 mg by nebulization every 6 (six) hours as needed for wheezing or shortness of breath.   atorvastatin  (LIPITOR) 40 MG tablet Take 1 tablet (40 mg total) by mouth daily.   clopidogrel  (PLAVIX ) 75 MG tablet Take 1 tablet (75 mg total) by mouth daily.   escitalopram  (LEXAPRO ) 10 MG tablet Take 15 mg by mouth at bedtime.   felodipine  (PLENDIL ) 5 MG 24 hr tablet Take 5 mg by mouth daily.   GUAIFENESIN PO Take 10 mLs by mouth every 6 (six) hours as needed.   indapamide  (LOZOL ) 2.5 MG tablet Take 2.5 mg by mouth daily.   KETOCONAZOLE, TOPICAL, 1 % SHAM Apply 1 Application topically 2 (two) times a week. Tuesday & Friday   Lactobacillus-Inulin (CULTURELLE DIGESTIVE HEALTH PO) Take 500 mg by mouth as needed (for loose stool or antibiotic use twice daily as needed.).   levothyroxine  (SYNTHROID , LEVOTHROID) 50 MCG tablet Take 50 mcg by mouth in the morning.   melatonin 5 MG TABS Take 5 mg by mouth daily.   memantine  (NAMENDA ) 5 MG tablet Take 1 tablet (5 mg total) by mouth 2 (two) times daily. Start with  5mg  daily for 1 week and then increase to 5mg  BID (Patient taking differently: Take 5 mg by mouth 2 (two) times daily.)   polyethylene glycol (MIRALAX / GLYCOLAX) 17 g packet Take 17 g by mouth daily.   traZODone  (DESYREL ) 50 MG tablet Take 50 mg by mouth daily.   hydrocortisone  (ANUSOL -HC) 2.5 % rectal cream Place 1 Application rectally 2 (two) times daily. (Patient not taking: Reported on 11/01/2023)   No facility-administered encounter medications on file as of  11/01/2023.    Review of Systems  Constitutional:  Negative for appetite change, chills, fatigue and fever.  HENT:  Negative for congestion, hearing loss, rhinorrhea and sore throat.   Eyes: Negative.   Respiratory:  Negative for cough, shortness of breath and wheezing.   Cardiovascular:  Negative for chest pain, palpitations and leg swelling.  Gastrointestinal:  Negative for abdominal pain, constipation, diarrhea, nausea and vomiting.  Genitourinary:  Negative for dysuria.  Musculoskeletal:  Negative for arthralgias, back pain and myalgias.  Skin:  Negative for color change, rash and wound.  Neurological:  Negative for dizziness, weakness and headaches.  Psychiatric/Behavioral:  Negative for behavioral problems. The patient is not nervous/anxious.        Immunization History  Administered Date(s) Administered   DTaP 09/30/1995   Influenza-Unspecified 04/12/2023   Moderna SARS-COV2 Booster Vaccination 03/25/2020, 11/25/2021   Moderna Sars-Covid-2 Vaccination 07/02/2019, 07/16/2019, 11/19/2020   Respiratory Syncytial Virus Vaccine,Recomb Aduvanted(Arexvy) 06/26/2022   Tdap 06/15/2015   Unspecified SARS-COV-2 Vaccination 04/20/2023   Pertinent  Health Maintenance Due  Topic Date Due   INFLUENZA VACCINE  01/13/2024   DEXA SCAN  Completed      06/25/2022   10:22 AM 06/25/2022    1:53 PM 08/25/2022   12:58 PM 06/27/2023   12:22 PM 10/14/2023   10:55 AM  Fall Risk  Falls in the past year? 0 0 1 1 1   Was there an injury  with Fall? 0 0 1 1 1   Fall Risk Category Calculator 0 0 2 2 2   Fall Risk Category (Retired) Low Low     (RETIRED) Patient Fall Risk Level High fall risk High fall risk     Patient at Risk for Falls Due to History of fall(s) History of fall(s);Impaired balance/gait;Impaired mobility History of fall(s);Impaired balance/gait;Impaired mobility History of fall(s);Impaired balance/gait History of fall(s);Impaired balance/gait  Fall risk Follow up Falls evaluation completed Falls evaluation completed;Education provided Falls evaluation completed;Education provided Falls evaluation completed;Education provided Falls evaluation completed;Education provided     Vitals:   11/01/23 1402 11/01/23 1412  BP: (!) 145/74 128/66  Pulse: (!) 43   Resp: 20   Temp: 97.7 F (36.5 C)   SpO2: 92%   Weight: 179 lb 9.6 oz (81.5 kg)   Height: 5\' 5"  (1.651 m)    Body mass index is 29.89 kg/m.  Physical Exam Constitutional:      Appearance: Normal appearance.  HENT:     Head: Normocephalic and atraumatic.     Nose: Nose normal.     Mouth/Throat:     Mouth: Mucous membranes are moist.  Eyes:     Conjunctiva/sclera: Conjunctivae normal.  Cardiovascular:     Rate and Rhythm: Normal rate and regular rhythm.  Pulmonary:     Effort: Pulmonary effort is normal.     Breath sounds: Normal breath sounds.  Abdominal:     General: Bowel sounds are normal.     Palpations: Abdomen is soft.  Musculoskeletal:        General: Normal range of motion.     Cervical back: Normal range of motion.  Skin:    General: Skin is warm and dry.  Neurological:     Mental Status: She is alert.  Psychiatric:        Mood and Affect: Mood normal.        Behavior: Behavior normal.      Labs reviewed: Recent Labs    11/20/22 0000 07/04/23 0000 10/30/23 1500 10/30/23 1502  NA 142 143  139 140  K 3.5 5.0 4.0 4.0  CL 103 104 102 100  CO2 28* 30* 27  --   GLUCOSE  --   --  143* 141*  BUN 14 27* 25* 29*  CREATININE 0.8  1.0 1.25* 1.20*  CALCIUM  8.4* 8.6* 8.6*  --    Recent Labs    11/20/22 0000 07/04/23 0000 10/30/23 1500  AST 13 19 22   ALT 7 12 18   ALKPHOS 52 53 54  BILITOT  --   --  0.7  PROT  --   --  6.1*  ALBUMIN 3.4* 3.7 3.4*   Recent Labs    11/20/22 0000 07/04/23 0000 10/30/23 1500 10/30/23 1502  WBC 6.6 6.1 7.2  --   NEUTROABS 3,703.00  --  4.1  --   HGB 13.5 13.2 13.9 14.3  HCT 40 40 42.0 42.0  MCV  --   --  99.5  --   PLT 215 201 209  --    Lab Results  Component Value Date   TSH 0.80 11/23/2022   Lab Results  Component Value Date   HGBA1C 6.7 11/23/2022   Lab Results  Component Value Date   CHOL 105 11/20/2022   HDL 39 11/20/2022   LDLCALC 47 11/20/2022   LDLDIRECT 135.1 08/26/2010   TRIG 100 11/20/2022   CHOLHDL 3.9 04/17/2022    Significant Diagnostic Results in last 30 days:  [name] CONTRAST Result Date: 10/30/2023 CLINICAL DATA:  Provided history: Neuro deficit, acute, stroke suspected. EXAM: MRI HEAD WITHOUT CONTRAST TECHNIQUE: Multiplanar, multiecho pulse sequences of the brain and surrounding structures were obtained without intravenous contrast. COMPARISON:  Non-contrast head CT and CT angiogram head/neck performed earlier today 10/30/2023. Brain MRI 04/16/2022. FINDINGS: Brain: Generalized cerebral atrophy. Small chronic left MCA territory infarcts within the left insula, temporal lobe and periatrial white matter, some of which were better appreciated on the prior brain MRI of 04/16/2022 (acute at that time). Chronic lacunar infarct within the left subinsular white matter, new from the prior MRI (series 5, image 15). Background moderate multifocal T2 FLAIR hyperintense signal abnormality within the cerebral white matter, nonspecific but compatible with chronic small vessel ischemic disease. There is no acute infarct. No evidence of an intracranial mass. No chronic intracranial blood products. No extra-axial fluid collection. No midline shift. Vascular: See  CTA head/neck performed earlier today. Skull and upper cervical spine: No focal worrisome marrow lesion. Sinuses/Orbits: No mass or acute finding within the imaged orbits. Prior bilateral ocular lens replacement. Redemonstrated chronic, depressed fracture deformity of the right orbital floor. Minimal mucosal thickening within the bilateral ethmoid sinuses. Other: Small-volume fluid within right mastoid air cells. IMPRESSION: 1. No acute intracranial finding. 2. Known small chronic left MCA territory infarcts within the left insula, temporal lobe and periatrial white matter. 3. Chronic lacunar infarct within the left subinsular white matter, new from the prior MRI of 04/16/2022. 4. Background moderate cerebral white matter chronic small vessel ischemic disease. 5. Generalized cerebral atrophy. Electronically Signed   By: Bascom Lily D.O.   On: 10/30/2023 18:42   CT ANGIO HEAD NECK W WO CM (CODE STROKE) Addendum Date: 10/30/2023 ADDENDUM REPORT: 10/30/2023 15:56 ADDENDUM: CTA head impression #1 called by telephone at the time of interpretation on 10/30/2023 at 3:45 pm to provider [facility] , who verbally acknowledged these results. Electronically Signed   By: Bascom Lily D.O.   On: 10/30/2023 15:56   Result Date: 10/30/2023 CLINICAL DATA:  Provided history: Neuro deficit,  acute, stroke suspected. Dizziness. Aphasia. EXAM: CT ANGIOGRAPHY HEAD AND NECK WITH AND WITHOUT CONTRAST TECHNIQUE: Multidetector CT imaging of the head and neck was performed using the standard protocol during bolus administration of intravenous contrast. Multiplanar CT image reconstructions and MIPs were obtained to evaluate the vascular anatomy. Carotid stenosis measurements (when applicable) are obtained utilizing NASCET criteria, using the distal internal carotid diameter as the denominator. RADIATION DOSE REDUCTION: This exam was performed according to the departmental dose-optimization program which includes automated exposure  control, adjustment of the mA and/or kV according to patient size and/or use of iterative reconstruction technique. CONTRAST:  75mL ISOVUE -370 IOPAMIDOL  (ISOVUE -370) INJECTION 76% COMPARISON:  Noncontrast head CT performed earlier today 10/30/2023. CT angiogram head/neck 04/17/2022. FINDINGS: CTA NECK FINDINGS Aortic arch: Standard aortic branching. Atherosclerotic plaque within the visualized thoracic aorta and proximal major branch vessels of the neck. No hemodynamically significant innominate or proximal subclavian artery stenosis. Right carotid system: CCA and ICA patent within the neck without hemodynamically significant stenosis (50% or greater). Mild-to-moderate atherosclerotic plaque about the carotid bifurcation and within the proximal ICA. Left carotid system: CCA and ICA patent within the neck without hemodynamically significant stenosis (50% or greater). Mild atherosclerotic plaque at the CCA origin and about the carotid bifurcation. Vertebral arteries: Venous reflux of contrast partially obscures left vertebral artery proximal V1 segment. Within this limitation, the vertebral arteries are codominant and patent within the neck without stenosis or significant atherosclerotic disease. Skeleton: Cervical spondylosis. No acute fracture or aggressive osseous lesion. Developmental nonunion of the C1 posterior arch on the right. Other neck: No neck mass or cervical lymphadenopathy. Upper chest: No consolidation within the imaged lung apices. Review of the MIP images confirms the above findings CTA HEAD FINDINGS Anterior circulation: The intracranial internal carotid arteries are patent. Nonstenotic atherosclerotic plaque within both vessels. The M1 middle cerebral arteries are patent. No M2 proximal branch occlusion is identified. Moderate/severe stenosis within a mid M2 left MCA vessel (at site of prior occlusion) (series 12, image 19). The anterior cerebral arteries are patent. Mildly hypoplastic left A1  segment. No intracranial aneurysm is identified. Posterior circulation: The intracranial vertebral arteries are patent. The basilar artery is patent. Occluded right posterior cerebral artery branch at the P2/P3 segment junction, new from the prior CTA of 04/17/2022 (for instance as seen on series 12, image 29). The left posterior cerebral artery is patent. A left posterior communicating artery is present. The right posterior communicating artery is diminutive or absent. Venous sinuses: Within the limitations of contrast timing, no convincing thrombus. Anatomic variants: As described. Review of the MIP images confirms the above findings Attempts are being made to reach the ordering provider at this time. IMPRESSION: CTA neck: 1. The common carotid and internal carotid arteries are patent within the neck without hemodynamically significant stenosis (50% or greater). Atherosclerotic plaque bilaterally, as described. 2. Venous reflux of contrast partially obscures the left vertebral artery proximal V1 segment. Within this limitation, the vertebral arteries are patent within the neck without stenosis or significant atherosclerotic disease. 3. Aortic Atherosclerosis (ICD10-I70.0). CTA head: 1. Occluded right posterior cerebral artery branch at the P2/P3 junction, new from the prior CTA of 04/17/2022. 2. Moderate/severe stenosis within a mid M2 left middle cerebral artery branch vessel (at site of prior occlusion). 3. Non-stenotic atherosclerotic plaque within the intracranial internal carotid arteries. Electronically Signed: By: Bascom Lily D.O. On: 10/30/2023 15:44   CT HEAD CODE STROKE WO CONTRAST Result Date: 10/30/2023 CLINICAL DATA:  Code stroke. Provided history: Neuro  deficit, acute, stroke suspected. EXAM: CT HEAD WITHOUT CONTRAST TECHNIQUE: Contiguous axial images were obtained from the base of the skull through the vertex without intravenous contrast. RADIATION DOSE REDUCTION: This exam was performed  according to the departmental dose-optimization program which includes automated exposure control, adjustment of the mA and/or kV according to patient size and/or use of iterative reconstruction technique. COMPARISON:  Non-contrast head CT and CT angiogram head/neck 04/17/2022. FINDINGS: Brain: Generalized cerebral atrophy. Small chronic infarcts within the left periatrial white matter. Additional known small chronic left MCA territory infarcts are not well appreciated by CT. Patchy and ill-defined hypoattenuation within the cerebral white matter, nonspecific but compatible with moderate chronic small vessel ischemic disease. There is no acute intracranial hemorrhage. No acute demarcated cortical infarct. No extra-axial fluid collection. No evidence of an intracranial mass. No midline shift. Vascular: No hyperdense vessel.  Atherosclerotic calcifications. Skull: No calvarial fracture or aggressive osseous lesion. Sinuses/Orbits: No mass or acute finding within the imaged orbits. Redemonstrated chronic, depressed fracture deformity of the right orbital floor. No significant inflammatory paranasal sinus disease. ASPECTS [facility] Stroke Program Early CT Score) - Ganglionic level infarction (caudate, lentiform nuclei, internal capsule, insula, M1-M3 cortex): 7 - Supraganglionic infarction (M4-M6 cortex): 3 Total score (0-10 with 10 being normal): 10 (when discounting chronic infarcts). No acute intracranial finding. These results were communicated to Dr. Bonnita Buttner at 3:13 pmon 5/18/2025by text page via the [facility] messaging system. IMPRESSION: 1.  No acute intracranial finding. 2. Parenchymal atrophy, chronic small vessel ischemic disease and chronic infarcts, as described. Electronically Signed   By: Bascom Lily D.O.   On: 10/30/2023 15:13    Assessment/Plan  1. Weakness (Primary) -  Labs were significant for elevated creatinine -  CT head negative for intracranial abnormality -   MRI showed known small chronic left  MCA territory infarcts within the left insula, temporal lobe and periatrial white matter and chronic lacunar infarct within the left subinsular white matter, new from the prior MRI of 04/16/2022. -   Patient declines interest in pursuing further workup to rule new stroke but  current treatment plan would not change -  She was given 1 L IV LR   2.  History of stroke -  Continue Plavix  75 mg daily -   Continue atorvastatin  40 mg daily  3. Essential hypertension -  BP 128/66, stable -   Continue felodipine  ER 5 mg daily  4. Recurrent depression (HCC) -   Mood is stable -   Continue Lexapro  15 mg daily  5. Cognitive impairment -  Continue Namenda  5 mg twice a day  6. Hypothyroidism, unspecified type Lab Results  Component Value Date   TSH 0.80 11/23/2022    -   Continue Synthroid  50 mcg daily      Family/ staff Communication: Discussed plan of care with resident and charge nurse.  Labs/tests ordered: None    Awilda Covin Medina-Vargas, DNP, MSN, FNP-BC [facility] and [facility] [phone] (Monday-Friday 8:00 a.m. - 5:00 p.m.) [phone] (after hours)

## 2023-11-04 ENCOUNTER — Encounter: Payer: Self-pay | Admitting: Orthopedic Surgery

## 2023-11-08 NOTE — Telephone Encounter (Signed)
Message routed to PCP Gupta, Anjali L, MD  

## 2023-12-12 ENCOUNTER — Encounter: Payer: Self-pay | Admitting: Orthopedic Surgery

## 2023-12-12 ENCOUNTER — Non-Acute Institutional Stay: Payer: Self-pay | Admitting: Orthopedic Surgery

## 2023-12-12 DIAGNOSIS — R197 Diarrhea, unspecified: Secondary | ICD-10-CM | POA: Diagnosis not present

## 2023-12-12 DIAGNOSIS — R300 Dysuria: Secondary | ICD-10-CM | POA: Diagnosis not present

## 2023-12-12 NOTE — Progress Notes (Signed)
 Location:  [facility] Room Number: 10/A Place of Service:  [id number]) Provider:  Greig FORBES Cluster, NP   Charlanne Fredia CROME, MD  Patient Care Team: Charlanne Fredia CROME, MD as PCP - General (Internal Medicine) Inocencio Soyla Lunger, MD as PCP - Cardiology (Cardiology) Charmayne Molly, MD as Consulting Physician (Ophthalmology) Delana Ted Morrison, DO as Consulting Physician (Obstetrics and Gynecology) Dohmeier, Dedra, MD as Consulting Physician (Neurology) Jesus Oliphant, MD as Consulting Physician (Otolaryngology)  Extended Emergency Contact Information Primary Emergency Contact: [facility] Address: [address]          [place], [place] [address] [place] Mobile Phone: [phone] Relation: Daughter Secondary Emergency Contact: [facility] Address: [address]          [place], [place] [address] [place] Phone: [phone] Relation: Friend  Code Status:  DNR Goals of care: Advanced Directive information    10/30/2023    3:20 PM  Advanced Directives  Does Patient Have a Medical Advance Directive? Unable to assess, patient is non-responsive or altered mental status     Chief Complaint  Patient presents with   Acute Visit    Dysuria, diarrhea    HPI:  Pt is a 89 y.o. female seen today for acute visit due to dysuria and diarrhea.   She currently resides on the assisted living unit at [facility]. PMH: HTN, HLD, loop recorder removal 2019, sleep apnea, hypothyroidism, ischemic stroke to Left MCA 04/2022, aortic stenosis, unstable gait, insomnia and depression.   06/26 she c/o dysuria. UA/culture and pyridium x 2 days ordered. UA with trace ketones, 1+ leukocytes, no blood or nitrates. Urine culture  < 10,000 cfu/mL gram positive organism. Today she denies dysuria but admits to diarrhea over the weekend. She is taking miralax. 11/2022 she had similar symptoms. KUB at that time revealed moderate stool in colon compatible  with constipation. She denies abdominal pain, bloating, nausea or vomiting today. Afebrile. Vitals stable.   Past Medical History:  Diagnosis Date   Allergy    Aortic stenosis    a. mild to mod by Echo 07/2012   Arthritis    Asymptomatic varicose veins    Benign paroxysmal vertigo    Bradycardia, unspecified    Bronchitis, not specified as acute or chronic    Cardiac murmur    Cataracts, bilateral    Chest pain, unspecified    Chronic cystitis    Depression    Diarrhea    Dyspnea    Dysuria    Frequency of micturition    Frequent urination    Gait disturbance 06/10/2014   Gastric ulcer    Hearing loss    Hemorrhoids    History of bone density study    History of colonoscopy    History of frequent urinary tract infections    History of urinary tract infection    Hx of cardiovascular stress test    a. ETT-Echo 3/12:  EF 60%, normal study   Hx of echocardiogram    a. Echo 2/14:  Mild LVH, EF 60-65%, Gr 1 diast dysfn, mild to mod AS, mean 17 mmHg, AVA 1.3 (VTI), trivial MR, mild LAE, PASP 31    Hyperlipemia    Hypertension    Hypothyroid    Left knee pain    Leg pain    Low back pain    Obstructive sleep apnea    OSA on CPAP    Other disorders of intestinal carbohydrate absorption  Other type of osteoarthritis, unspecified site    Overweight    Pain in thoracic spine    Palpitations    a. event monitor 3/14:  NSR, sinus brady   Plantar fasciitis    Plantar fasciitis    Right shoulder pain    S/P cholecystectomy    S/P TAH-BSO    Sleep apnea    Stroke (HCC)    tia 2016   TIA (transient ischemic attack)    Varicose veins of unspecified lower extremity with inflammation    Past Surgical History:  Procedure Laterality Date   ABDOMINAL HYSTERECTOMY     CHOLECYSTECTOMY     EP IMPLANTABLE DEVICE N/A 06/29/2016   Procedure: Loop Recorder Insertion;  Surgeon: Will Gladis Norton, MD;  Location: [facility]AS[facility];  Service: Cardiovascular;  Laterality: N/A;   FOOT  SURGERY  1955   INCONTINENCE SURGERY     LOOP RECORDER REMOVAL N/A 08/10/2017   Procedure: LOOP RECORDER REMOVAL;  Surgeon: Norton Soyla Gladis, MD;  Location: [facility];  Service: Cardiovascular;  Laterality: N/A;   TONSILLECTOMY      Allergies  Allergen Reactions   Bee Venom Other (See Comments)    Unknown reaction Per facility    Cipro [Ciprofloxacin Hcl] Other (See Comments)    Unknown reaction  Per facility   Crestor [Rosuvastatin] Other (See Comments)    Myalgias    Lodine [Etodolac] Other (See Comments)    Unknown reaction Per facility   Naprosyn [Naproxen] Other (See Comments)    Unknown reaction Per facility   Sulfonamide Derivatives Other (See Comments)    Unknown reaction Per facility   Tositumomab    Epipen  [Epinephrine ] Palpitations   Macrobid [Nitrofurantoin Macrocrystal] Rash   Metrocream [Metronidazole] Rash   Xanax [Alprazolam] Rash    Outpatient Encounter Medications as of 12/12/2023  Medication Sig   acetaminophen  (TYLENOL ) 500 MG tablet Take 1 tablet (500 mg total) by mouth every 8 (eight) hours as needed for moderate pain.   albuterol (PROVENTIL) (2.5 MG/3ML) 0.083% nebulizer solution Take 2.5 mg by nebulization every 6 (six) hours as needed for wheezing or shortness of breath.   atorvastatin  (LIPITOR) 40 MG tablet Take 1 tablet (40 mg total) by mouth daily.   clopidogrel  (PLAVIX ) 75 MG tablet Take 1 tablet (75 mg total) by mouth daily.   escitalopram  (LEXAPRO ) 10 MG tablet Take 15 mg by mouth at bedtime.   felodipine  (PLENDIL ) 5 MG 24 hr tablet Take 5 mg by mouth daily.   GUAIFENESIN PO Take 10 mLs by mouth every 6 (six) hours as needed.   hydrocortisone  (ANUSOL -HC) 2.5 % rectal cream Place 1 Application rectally 2 (two) times daily. (Patient not taking: Reported on 11/01/2023)   indapamide  (LOZOL ) 2.5 MG tablet Take 2.5 mg by mouth daily.   KETOCONAZOLE, TOPICAL, 1 % SHAM Apply 1 Application topically 2 (two) times a week. Tuesday & Friday    Lactobacillus-Inulin (CULTURELLE DIGESTIVE HEALTH PO) Take 500 mg by mouth as needed (for loose stool or antibiotic use twice daily as needed.).   levothyroxine  (SYNTHROID , LEVOTHROID) 50 MCG tablet Take 50 mcg by mouth in the morning.   melatonin 5 MG TABS Take 5 mg by mouth daily.   memantine  (NAMENDA ) 5 MG tablet Take 1 tablet (5 mg total) by mouth 2 (two) times daily. Start with 5mg  daily for 1 week and then increase to 5mg  BID (Patient taking differently: Take 5 mg by mouth 2 (two) times daily.)   polyethylene glycol (  MIRALAX / GLYCOLAX) 17 g packet Take 17 g by mouth daily.   traZODone  (DESYREL ) 50 MG tablet Take 50 mg by mouth daily.   No facility-administered encounter medications on file as of 12/12/2023.    Review of Systems  Constitutional:  Negative for fatigue and fever.  Respiratory:  Negative for cough and shortness of breath.   Cardiovascular:  Negative for chest pain and leg swelling.  Gastrointestinal:  Positive for constipation and diarrhea. Negative for abdominal distention, abdominal pain, nausea and vomiting.  Genitourinary:  Positive for dysuria. Negative for frequency and hematuria.  Psychiatric/Behavioral:  Positive for confusion. Negative for dysphoric mood. The patient is not nervous/anxious.     Immunization History  Administered Date(s) Administered   DTaP 09/30/1995   Influenza-Unspecified 04/12/2023   Moderna SARS-COV2 Booster Vaccination 03/25/2020, 11/25/2021   Moderna Sars-Covid-2 Vaccination 07/02/2019, 07/16/2019, 11/19/2020   Respiratory Syncytial Virus Vaccine,Recomb Aduvanted(Arexvy) 06/26/2022   Tdap 06/15/2015   Unspecified SARS-COV-2 Vaccination 04/20/2023   Pertinent  Health Maintenance Due  Topic Date Due   INFLUENZA VACCINE  01/13/2024   DEXA SCAN  Completed      06/25/2022   10:22 AM 06/25/2022    1:53 PM 08/25/2022   12:58 PM 06/27/2023   12:22 PM 10/14/2023   10:55 AM  Fall Risk  Falls in the past year? 0 0 1 1 1   Was there an injury  with Fall? 0 0 1 1 1   Fall Risk Category Calculator 0 0 2 2 2   Fall Risk Category (Retired) Low  Low      (RETIRED) Patient Fall Risk Level High fall risk  High fall risk      Patient at Risk for Falls Due to History of fall(s) History of fall(s);Impaired balance/gait;Impaired mobility History of fall(s);Impaired balance/gait;Impaired mobility History of fall(s);Impaired balance/gait History of fall(s);Impaired balance/gait  Fall risk Follow up Falls evaluation completed  Falls evaluation completed;Education provided  Falls evaluation completed;Education provided Falls evaluation completed;Education provided Falls evaluation completed;Education provided     Data saved with a previous flowsheet row definition   Functional Status Survey:    Vitals:   12/12/23 1305  BP: 109/64  Pulse: 62  Resp: 20  Temp: (!) 97.5 F (36.4 C)  SpO2: 94%  Weight: 179 lb 9.6 oz (81.5 kg)  Height: 5' 5 (1.651 m)   Body mass index is 29.89 kg/m. Physical Exam Vitals reviewed.  Constitutional:      General: She is not in acute distress. HENT:     Head: Normocephalic.   Eyes:     General:        Right eye: No discharge.        Left eye: No discharge.    Cardiovascular:     Rate and Rhythm: Normal rate and regular rhythm.     Pulses: Normal pulses.     Heart sounds: Normal heart sounds.  Pulmonary:     Effort: Pulmonary effort is normal.     Breath sounds: Normal breath sounds.  Abdominal:     General: There is no distension.     Palpations: Abdomen is soft. There is no mass.     Tenderness: There is no abdominal tenderness. There is no guarding or rebound.     Hernia: No hernia is present.     Comments: RLQ and LLQ hypoactive   Musculoskeletal:        General: Normal range of motion.     Cervical back: Neck supple.   Skin:  General: Skin is warm.     Capillary Refill: Capillary refill takes less than 2 seconds.   Neurological:     General: No focal deficit present.     Mental  Status: She is alert. Mental status is at baseline.   Psychiatric:        Mood and Affect: Mood normal.     Labs reviewed: Recent Labs    07/04/23 0000 10/30/23 1500 10/30/23 1502  NA 143 139 140  K 5.0 4.0 4.0  CL 104 102 100  CO2 30* 27  --   GLUCOSE  --  143* 141*  BUN 27* 25* 29*  CREATININE 1.0 1.25* 1.20*  CALCIUM  8.6* 8.6*  --    Recent Labs    07/04/23 0000 10/30/23 1500  AST 19 22  ALT 12 18  ALKPHOS 53 54  BILITOT  --  0.7  PROT  --  6.1*  ALBUMIN 3.7 3.4*   Recent Labs    07/04/23 0000 10/30/23 1500 10/30/23 1502  WBC 6.1 7.2  --   NEUTROABS  --  4.1  --   HGB 13.2 13.9 14.3  HCT 40 42.0 42.0  MCV  --  99.5  --   PLT 201 209  --    Lab Results  Component Value Date   TSH 0.80 11/23/2022   Lab Results  Component Value Date   HGBA1C 6.7 11/23/2022   Lab Results  Component Value Date   CHOL 105 11/20/2022   HDL 39 11/20/2022   LDLCALC 47 11/20/2022   LDLDIRECT 135.1 08/26/2010   TRIG 100 11/20/2022   CHOLHDL 3.9 04/17/2022    Significant Diagnostic Results in last 30 days:  No results found.  Assessment/Plan 1. Dysuria (Primary) - noted 06/26 - UA/culture- < 10,000 cfu/mL gram positive organism - symptoms resolved with pyridium x 2 days - cont hydration with water  2. Diarrhea, unspecified type - onset 2 days ago - h/o constipation 11/2022> KUB  moderate stool in colon - afebrile, RLQ and LLQ with hypoactive BS - increased risk for constipation due to age, decreased mobility and hydration - on miralax daily - KUB    Family/ staff Communication: plan discussed with patient, daughter and nurse  Labs/tests ordered:  KUB

## 2024-01-11 ENCOUNTER — Ambulatory Visit: Payer: Medicare PPO | Admitting: Adult Health

## 2024-01-26 NOTE — Progress Notes (Deleted)
 [facility] [address] [place]. [place] [address] [phone]       OFFICE FOLLOW UP NOTE  [name] Date of Birth:  09/20/1929 Medical Record Number:  [id number]    Reason for visit: New strokelike symptoms    SUBJECTIVE:   CHIEF COMPLAINT:  No chief complaint on file.   HPI:   Update 01/30/2024 JM: Patient returns for follow-up visit.  [facility].  Daughter sent MyChart message on 5/7 with concerns of episodes of increased confusion lasting 1 to 2 days causing patient to be more anxious which is not her normal.  This apparently occurred on 2-3 other occasions in April.   She was seen in the ED back in May after episode of dizziness, difficulty standing from seated position, and some difficulty speaking.  Neuroexam intact upon presentation.  CT head and MRI brain without acute abnormality.  CTA without acute abnormality.  No evidence of infection.  Suspected recrudescence in setting of mild hypovolemia with mildly elevated creatinine and decreased BP with improvement after fluids.  No reoccurrence after BP stabilized.  No new stroke/TIA symptoms since that time.  Remains on Plavix  and atorvastatin  without side effects.  MMSE 28/30 (in May by ALF)    History provided for reference purposes only Update 07/04/2023 JM: Patient is being seen per daughter request due to recent strokelike symptoms.  She is accompanied by her daughter.  She was previously seen over 1 year ago for stroke follow-up and doing well with residual aphasia and mild cognitive difficulties but gradually improving.  She was advised to follow-up as needed.  Continues to reside at [facility] assisted living.  She was found on the floor on 1/13 during routine nursing checks, she was complaining of ankle pain.  Nursing reported increased nasal congestion and fever x3 days, COVID and flu negative  Daughter reports the  following day she noticed slurred speech, right-sided facial weakness, worsening cognition and worsening conversational acuity. Speech has since returned to baseline and no longer having facial weakness, lasted about 48 hours. She continues to have some cognitive decline per daughter, has been gradual since her stroke in 2023 with more noticeable progression over the past week. MMSE today 22/30 (prior 30/30 06/2022).  No behavioral concerns.  She has been compliant on Plavix  and atorvastatin .  Prior lab work in 11/2022 showed LDL 47 and A1c 6.7.    Does have history of OSA, daughter concerned regarding intermittent use of CPAP, has tried to have [facility] be more diligent on ensuring she is using nightly but this is deemed to be difficult.  Patient is currently using a nasal pillow mask, tolerating mask okay but overall dislikes using CPAP.  Prior sleep study completed in 2021 showing overall mild sleep apnea and received new AutoPap machine.  Has not had a follow-up specifically for CPAP since that time.  Initial visit 05/19/2022 JM: Patient is being seen for initial hospital follow-up unaccompanied.  She has been working with speech therapy at [facility] for residual aphasia and cognitive difficulties.  Does report some improvement since discharge.  Denies any physical deficits.  Ambulates with rolling walker at baseline, denies any recent falls.  Remains on both aspirin  and Plavix  as well as atorvastatin  Blood pressure well controlled Heart monitor - will be completed Dec 11th She has since had follow-up with PCP  Hospitalization 04/16/2022 [name] is a 89 y.o. female with history  of mild aortic stenosis, hypertension, hyperlipidemia, obstructive sleep apnea on CPAP, prior history of TIA 2016 without residual deficits who presented to the emergency room on 04/16/2022 for episode of confusion and word finding difficulty.  Evaluated by Dr. Rosemarie for acute left MCA ischemic infarcts of  cryptogenic etiology.  CTA head/neck showed acute left M2 occlusion. EF 70 to 75%.  LDL 115.  A1c 6.1.  Recommended DAPT for 3 weeks then Plavix  alone and increased home dose atorvastatin  from 20 mg to 40 mg daily.  Recommended outpatient cardiac monitor to evaluate for A-fib.  Discharged home in stable condition without therapy needs.   PERTINENT IMAGING  CT head 1. No acute intracranial abnormality. 2. Unchanged age related atrophy and mild chronic small vessel ischemia. CTA head & neck acute left M2 occlusion MRI  1. Scattered patchy acute ischemic nonhemorrhagic left MCA distribution infarcts involving the left insula and adjacent left temporal region. No associated hemorrhage or significant mass effect.2. Underlying mild chronic microvascular ischemic disease 2D Echo EF 70-75%. left ventricle has hyperdynamic function. mild left ventricular hypertrophy. Left ventricular diastolic parameters are consistent with Grade I diastolic dysfunction    LDL 115 HgbA1c 6.1    ROS:   14 system review of systems performed and negative with exception of those listed in HPI  PMH:  Past Medical History:  Diagnosis Date   Allergy    Aortic stenosis    a. mild to mod by Echo 07/2012   Arthritis    Asymptomatic varicose veins    Benign paroxysmal vertigo    Bradycardia, unspecified    Bronchitis, not specified as acute or chronic    Cardiac murmur    Cataracts, bilateral    Chest pain, unspecified    Chronic cystitis    Depression    Diarrhea    Dyspnea    Dysuria    Frequency of micturition    Frequent urination    Gait disturbance 06/10/2014   Gastric ulcer    Hearing loss    Hemorrhoids    History of bone density study    History of colonoscopy    History of frequent urinary tract infections    History of urinary tract infection    Hx of cardiovascular stress test    a. ETT-Echo 3/12:  EF 60%, normal study   Hx of echocardiogram    a. Echo 2/14:  Mild LVH, EF 60-65%, Gr 1 diast  dysfn, mild to mod AS, mean 17 mmHg, AVA 1.3 (VTI), trivial MR, mild LAE, PASP 31    Hyperlipemia    Hypertension    Hypothyroid    Left knee pain    Leg pain    Low back pain    Obstructive sleep apnea    OSA on CPAP    Other disorders of intestinal carbohydrate absorption    Other type of osteoarthritis, unspecified site    Overweight    Pain in thoracic spine    Palpitations    a. event monitor 3/14:  NSR, sinus brady   Plantar fasciitis    Plantar fasciitis    Right shoulder pain    S/P cholecystectomy    S/P TAH-BSO    Sleep apnea    Stroke (HCC)    tia 2016   TIA (transient ischemic attack)    Varicose veins of unspecified lower extremity with inflammation     PSH:  Past Surgical History:  Procedure Laterality Date   ABDOMINAL HYSTERECTOMY     CHOLECYSTECTOMY  EP IMPLANTABLE DEVICE N/A 06/29/2016   Procedure: Loop Recorder Insertion;  Surgeon: Will Gladis Norton, MD;  Location: [facility] INVASIVE CV [facility];  Service: Cardiovascular;  Laterality: N/A;   FOOT SURGERY  1955   INCONTINENCE SURGERY     LOOP RECORDER REMOVAL N/A 08/10/2017   Procedure: LOOP RECORDER REMOVAL;  Surgeon: Norton Soyla Gladis, MD;  Location: [facility]VAS[facility] CV [facility];  Service: Cardiovascular;  Laterality: N/A;   TONSILLECTOMY      Social History:  Social History   Socioeconomic History   Marital status: Widowed    Spouse name: Not on file   Number of children: 2   Years of education: college gr   Highest education level: Not on file  Occupational History   Occupation: retired Runner, broadcasting/film/video  Tobacco Use   Smoking status: Former    Types: Cigarettes   Smokeless tobacco: Never  Vaping Use   Vaping status: Never Used  Substance and Sexual Activity   Alcohol  use: Yes    Alcohol /week: 1.0 standard drink of alcohol     Types: 1 Glasses of wine per week    Comment: wine sometimes   Drug use: No   Sexual activity: Not on file  Other Topics Concern   Not on file  Social History Narrative   Diet:       Caffeine:  2-3 cups of caffeine daily- decaf coffee.      Married, if yes what year: widow. Married 1955       Do you live in a house, apartment, assisted living, condo, trailer, ect: [facility].   Is it one or more stories: one      How many persons live in your home? 1      Pets:cno      Highest level or education completed: college degree      Current/Past profession: retired Engineer, site      Exercise:  yes                Type and how often: daily         Living Will: Yes   DNR: Yes   POA/HPOA: Yes      Functional Status:   Do you have difficulty bathing or dressing yourself? No   Do you have difficulty preparing food or eating? No   Do you have difficulty managing your medications? No   Do you have difficulty managing your finances? No   Do you have difficulty affording your medications? No      Patient is right handed.    Social Drivers of Corporate investment banker Strain: Low Risk  (10/14/2023)   Overall Financial Resource Strain (CARDIA)    Difficulty of Paying Living Expenses: Not hard at all  Food Insecurity: No Food Insecurity (10/14/2023)   Hunger Vital Sign    Worried About Running Out of Food in the Last Year: Never true    Ran Out of Food in the Last Year: Never true  Transportation Needs: No Transportation Needs (10/14/2023)   PRAPARE - Administrator, Civil Service (Medical): No    Lack of Transportation (Non-Medical): No  Physical Activity: Insufficiently Active (10/14/2023)   Exercise Vital Sign    Days of Exercise per Week: 3 days    Minutes of Exercise per Session: 30 min  Stress: No Stress Concern Present (10/14/2023)   [employer] of Occupational Health - Occupational Stress Questionnaire    Feeling of Stress : Only a little  Social Connections: Socially Isolated (10/14/2023)   Social Connection and Isolation Panel    Frequency of Communication with Friends and Family: Twice a week    Frequency of  Social Gatherings with Friends and Family: Once a week    Attends Religious Services: Never    Database administrator or Organizations: No    Attends Banker Meetings: Never    Marital Status: Widowed  Intimate Partner Violence: Not At Risk (10/14/2023)   Humiliation, Afraid, Rape, and Kick questionnaire    Fear of Current or Ex-Partner: No    Emotionally Abused: No    Physically Abused: No    Sexually Abused: No    Family History:  Family History  Problem Relation Age of Onset   Heart attack Mother 18   Heart failure Mother    Arthritis Mother    Lung disease Father    Cancer - Prostate Father    Healthy Brother    Healthy Brother    Heart failure Maternal Uncle    Cancer Son     Medications:   Current Outpatient Medications on File Prior to Visit  Medication Sig Dispense Refill   acetaminophen  (TYLENOL ) 500 MG tablet Take 1 tablet (500 mg total) by mouth every 8 (eight) hours as needed for moderate pain. 30 tablet 0   albuterol (PROVENTIL) (2.5 MG/3ML) 0.083% nebulizer solution Take 2.5 mg by nebulization every 6 (six) hours as needed for wheezing or shortness of breath.     atorvastatin  (LIPITOR) 40 MG tablet Take 1 tablet (40 mg total) by mouth daily. 30 tablet 1   clopidogrel  (PLAVIX ) 75 MG tablet Take 1 tablet (75 mg total) by mouth daily. 30 tablet 3   escitalopram  (LEXAPRO ) 10 MG tablet Take 15 mg by mouth at bedtime.     felodipine  (PLENDIL ) 5 MG 24 hr tablet Take 5 mg by mouth daily.     GUAIFENESIN PO Take 10 mLs by mouth every 6 (six) hours as needed.     hydrocortisone  (ANUSOL -HC) 2.5 % rectal cream Place 1 Application rectally 2 (two) times daily. (Patient not taking: Reported on 11/01/2023)     indapamide  (LOZOL ) 2.5 MG tablet Take 2.5 mg by mouth daily.     KETOCONAZOLE, TOPICAL, 1 % SHAM Apply 1 Application topically 2 (two) times a week. Tuesday & Friday     Lactobacillus-Inulin (CULTURELLE DIGESTIVE HEALTH PO) Take 500 mg by mouth as needed (for  loose stool or antibiotic use twice daily as needed.).     levothyroxine  (SYNTHROID , LEVOTHROID) 50 MCG tablet Take 50 mcg by mouth in the morning.     melatonin 5 MG TABS Take 5 mg by mouth daily.     memantine  (NAMENDA ) 5 MG tablet Take 1 tablet (5 mg total) by mouth 2 (two) times daily. Start with 5mg  daily for 1 week and then increase to 5mg  BID (Patient taking differently: Take 5 mg by mouth 2 (two) times daily.) 60 tablet 5   polyethylene glycol (MIRALAX / GLYCOLAX) 17 g packet Take 17 g by mouth daily.     traZODone  (DESYREL ) 50 MG tablet Take 50 mg by mouth daily.     No current facility-administered medications on file prior to visit.    Allergies:   Allergies  Allergen Reactions   Bee Venom Other (See Comments)    Unknown reaction Per facility    Cipro [Ciprofloxacin Hcl] Other (See Comments)    Unknown reaction  Per facility   Crestor [Rosuvastatin] Other (See  Comments)    Myalgias    Lodine [Etodolac] Other (See Comments)    Unknown reaction Per facility   Naprosyn [Naproxen] Other (See Comments)    Unknown reaction Per facility   Sulfonamide Derivatives Other (See Comments)    Unknown reaction Per facility   Tositumomab    Epipen  [Epinephrine ] Palpitations   Macrobid [Nitrofurantoin Macrocrystal] Rash   Metrocream [Metronidazole] Rash   Xanax [Alprazolam] Rash      OBJECTIVE:  Physical Exam  There were no vitals filed for this visit.  There is no height or weight on file to calculate BMI. No results found.   General: well developed, well nourished, very pleasant elderly Caucasian female, seated, in no evident distress Head: head normocephalic and atraumatic.   Neck: supple with no carotid or supraclavicular bruits Cardiovascular: regular rate and rhythm  Neurologic Exam Mental Status: Awake and fully alert.  Unable to appreciate dysarthria or aphasia. Attention span, concentration and fund of knowledge mildly impaired, difficulty with comprehension.  Mood and affect appropriate.  Cranial Nerves: Pupils equal, briskly reactive to light. Extraocular movements full without nystagmus. Visual fields full to confrontation.  HOH bilaterally. Facial sensation intact. Face, tongue, palate moves normally and symmetrically.  Motor: Normal bulk and tone. Normal strength in all tested extremity muscles Sensory.: intact to touch , pinprick , position and vibratory sensation.  Coordination: Rapid alternating movements normal in all extremities. Finger-to-nose and heel-to-shin performed accurately bilaterally. Gait and Station: Arises from chair without difficulty. Stance is slightly hunched.  Gait demonstrates normal stride length and balance with use of rolling walker.  Reflexes: 1+ and symmetric. Toes downgoing.      10/14/2023   10:41 AM 07/04/2023   11:13 AM 06/25/2022    1:55 PM  MMSE - Mini Mental State Exam  Orientation to time 4 4 5   Orientation to Place 5 3 5   Registration 3 3 3   Attention/ Calculation 5 1 5   Recall 3 2 3   Language- name 2 objects 2 2 2   Language- repeat 1 1 1   Language- follow 3 step command 3 3 3   Language- read & follow direction 1 1 1   Write a sentence 1 1 1   Copy design 0 1 1  Total score 28 22 30         ASSESSMENT: Autumn Allen is a 89 y.o. year old female with hx of left MCA ischemic infarcts on 04/16/2022 of cryptogenic etiology. Vascular risk factors include HLD, history of TIA, CAD and OSA on CPAP. Patient suffered a fall on 06/27/2023 and started to experience slurred speech, facial weakness and cognitive worsening the following day, speech and facial weakness resolved within 48 hours but continues to have gradual cognitive decline, did have viral infection at that same time with cough and low-grade fever.     PLAN:  New neurological symptoms Unclear if in setting of recrudescence of prior stroke symptoms in setting of viral infection vs new stroke Unable to appreciate speech or language impairment  or any other neurological deficits on exam Discussed pursuing further workup with MRI brain to rule out new stroke but as current treatment plan would not change, patient declines interest in pursuing at this time Continue secondary stroke prevention measures as noted below  Hx of cryptogenic left MCA stroke:  Cardiac monitor 05/2022 negative for A-fib Continue Plavix  and atorvastatin  (Lipitor) for secondary stroke prevention managed/prescribed by PCP Discussed secondary stroke prevention measures and importance of close PCP follow up for aggressive stroke risk factor  management including BP goal<130/90, HLD with LDL goal<70 and DM with A1c.<7 .  Stroke labs 11/2022: LDL 47, A1c 6.7  Cognitive impairment:  Suspect multifactoral with history of prior stroke, multiple comorbidities, age, untreated sleep apnea and polypharmacy MMSE 21/30 (prior 30/30 06/2022 completed at [facility]) Recommend trial of memantine  5 mg daily for 1 week then increase to 5 mg twice daily, potential side effects discussed Would not recommend trial of donepezil due to already low HR  Mild sleep apnea:  Patient having difficulty tolerating nightly use of CPAP Referral placed to patient's personal dentist to be evaluated for oral appliance (was confirmed by daughter they can assist with this device) She was encouraged to continue to use CPAP nightly until that time Discussed importance of sleep apnea treatment as untreated apnea increases cardiovascular risk and can also be contributing to gradual memory decline    Follow-up in 6 months or call earlier if needed   CC:  PCP: Charlanne Fredia CROME, MD    I personally spent a total of *** minutes in the care of the patient today including {Time Based Coding:[id number]}.   Harlene Bogaert, AGNP-BC  [facility] [address] [place], [place] [address]  Phone [phone] Fax [phone] Note: This document was prepared with digital  dictation and possible smart phrase technology. Any transcriptional errors that result from this process are unintentional.

## 2024-01-30 ENCOUNTER — Telehealth: Payer: Self-pay | Admitting: Adult Health

## 2024-01-30 ENCOUNTER — Ambulatory Visit: Payer: Medicare PPO | Admitting: Adult Health

## 2024-01-30 NOTE — Telephone Encounter (Signed)
 Request to R/s with wait list, daughter asking to be called if anything opens earlier

## 2024-01-30 NOTE — Telephone Encounter (Signed)
 LVM and sent mychart msg informing pt of need to reschedule 01/30/24 appt - NP out

## 2024-03-14 ENCOUNTER — Non-Acute Institutional Stay: Payer: Self-pay | Admitting: Orthopedic Surgery

## 2024-03-14 ENCOUNTER — Encounter: Payer: Self-pay | Admitting: Orthopedic Surgery

## 2024-03-14 DIAGNOSIS — J029 Acute pharyngitis, unspecified: Secondary | ICD-10-CM

## 2024-03-14 DIAGNOSIS — K644 Residual hemorrhoidal skin tags: Secondary | ICD-10-CM | POA: Diagnosis not present

## 2024-03-14 DIAGNOSIS — R051 Acute cough: Secondary | ICD-10-CM

## 2024-03-14 NOTE — Progress Notes (Signed)
 Location:  [facility] Room Number: 10/A Place of Service:  ALF [phone]) Provider:  Greig FORBES Cluster, NP   Charlanne Fredia CROME, MD  Patient Care Team: Charlanne Fredia CROME, MD as PCP - General (Internal Medicine) Inocencio Soyla Lunger, MD as PCP - Cardiology (Cardiology) Charmayne Molly, MD as Consulting Physician (Ophthalmology) Delana Ted Morrison, DO as Consulting Physician (Obstetrics and Gynecology) Dohmeier, Dedra, MD as Consulting Physician (Neurology) Jesus Oliphant, MD as Consulting Physician (Otolaryngology)  Extended Emergency Contact Information Primary Emergency Contact: SHANA GWYNNETH GULL Address: [address] - healthcare and general Power of [place], docs on file          [place], [place] [address] [place] Phone: [phone] Relation: Daughter Secondary Emergency Contact: [facility] Address: [address]          [place], [place] [address] [place] Phone: [phone] Relation: Friend  Code Status:  DNR Goals of care: Advanced Directive information    10/30/2023    3:20 PM  Advanced Directives  Does Patient Have a Medical Advance Directive? Unable to assess, patient is non-responsive or altered mental status     Chief Complaint  Patient presents with   Acute Visit    Sore throat, acute cough, external hemorrhoids    HPI:  Pt is a 89 y.o. female seen today for acute visit due to sore throat, acute cough and rectal discomfort.   She currently resides on the assisted living unit at [facility]. PMH: HTN, HLD, loop recorder removal 2019, sleep apnea, hypothyroidism, ischemic stroke to Left MCA 04/2022, aortic stenosis, unstable gait, insomnia and depression.   Poor historian due to cognitive impairment. 09/29 she wokr up with sore throat. 09/30 she had increased cough. Covid test negative. Today she admits to sore throat. She is able to swallow fluids and foods. CXR was ordered 09/29> pending. Afebrile.  Vitals stable.   Intermittent rectal pain per patient/nursing. Past use of Anusol  for external hemorrhoids.    Past Medical History:  Diagnosis Date   Allergy    Aortic stenosis    a. mild to mod by Echo 07/2012   Arthritis    Asymptomatic varicose veins    Benign paroxysmal vertigo    Bradycardia, unspecified    Bronchitis, not specified as acute or chronic    Cardiac murmur    Cataracts, bilateral    Chest pain, unspecified    Chronic cystitis    Depression    Diarrhea    Dyspnea    Dysuria    Frequency of micturition    Frequent urination    Gait disturbance 06/10/2014   Gastric ulcer    Hearing loss    Hemorrhoids    History of bone density study    History of colonoscopy    History of frequent urinary tract infections    History of urinary tract infection    Hx of cardiovascular stress test    a. ETT-Echo 3/12:  EF 60%, normal study   Hx of echocardiogram    a. Echo 2/14:  Mild LVH, EF 60-65%, Gr 1 diast dysfn, mild to mod AS, mean 17 mmHg, AVA 1.3 (VTI), trivial MR, mild LAE, PASP 31    Hyperlipemia    Hypertension    Hypothyroid    Left knee pain    Leg pain    Low back pain    Obstructive sleep apnea    OSA on CPAP    Other disorders of  intestinal carbohydrate absorption    Other type of osteoarthritis, unspecified site    Overweight    Pain in thoracic spine    Palpitations    a. event monitor 3/14:  NSR, sinus brady   Plantar fasciitis    Plantar fasciitis    Right shoulder pain    S/P cholecystectomy    S/P TAH-BSO    Sleep apnea    Stroke (HCC)    tia 2016   TIA (transient ischemic attack)    Varicose veins of unspecified lower extremity with inflammation    Past Surgical History:  Procedure Laterality Date   ABDOMINAL HYSTERECTOMY     CHOLECYSTECTOMY     EP IMPLANTABLE DEVICE N/A 06/29/2016   Procedure: Loop Recorder Insertion;  Surgeon: Will Gladis Norton, MD;  Location: [facility] INVASIVE CV [facility];  Service: Cardiovascular;  Laterality: N/A;    FOOT SURGERY  1955   INCONTINENCE SURGERY     LOOP RECORDER REMOVAL N/A 08/10/2017   Procedure: LOOP RECORDER REMOVAL;  Surgeon: Norton Soyla Gladis, MD;  Location: [facility] CV [facility];  Service: Cardiovascular;  Laterality: N/A;   TONSILLECTOMY      Allergies  Allergen Reactions   Bee Venom Other (See Comments)    Unknown reaction Per facility    Cipro [Ciprofloxacin Hcl] Other (See Comments)    Unknown reaction  Per facility   Crestor [Rosuvastatin] Other (See Comments)    Myalgias    Lodine [Etodolac] Other (See Comments)    Unknown reaction Per facility   Naprosyn [Naproxen] Other (See Comments)    Unknown reaction Per facility   Sulfonamide Derivatives Other (See Comments)    Unknown reaction Per facility   Tositumomab    Epipen  [Epinephrine ] Palpitations   Macrobid [Nitrofurantoin Macrocrystal] Rash   Metrocream [Metronidazole] Rash   Xanax [Alprazolam] Rash    Outpatient Encounter Medications as of 03/14/2024  Medication Sig   acetaminophen  (TYLENOL ) 500 MG tablet Take 1 tablet (500 mg total) by mouth every 8 (eight) hours as needed for moderate pain.   albuterol (PROVENTIL) (2.5 MG/3ML) 0.083% nebulizer solution Take 2.5 mg by nebulization every 6 (six) hours as needed for wheezing or shortness of breath.   atorvastatin  (LIPITOR) 40 MG tablet Take 1 tablet (40 mg total) by mouth daily.   clopidogrel  (PLAVIX ) 75 MG tablet Take 1 tablet (75 mg total) by mouth daily.   escitalopram  (LEXAPRO ) 10 MG tablet Take 15 mg by mouth at bedtime.   felodipine  (PLENDIL ) 5 MG 24 hr tablet Take 5 mg by mouth daily.   GUAIFENESIN PO Take 10 mLs by mouth every 6 (six) hours as needed.   hydrocortisone  (ANUSOL -HC) 2.5 % rectal cream Place 1 Application rectally 2 (two) times daily. (Patient not taking: Reported on 11/01/2023)   indapamide  (LOZOL ) 2.5 MG tablet Take 2.5 mg by mouth daily.   KETOCONAZOLE, TOPICAL, 1 % SHAM Apply 1 Application topically 2 (two) times a week. Tuesday &  Friday   Lactobacillus-Inulin (CULTURELLE DIGESTIVE HEALTH PO) Take 500 mg by mouth as needed (for loose stool or antibiotic use twice daily as needed.).   levothyroxine  (SYNTHROID , LEVOTHROID) 50 MCG tablet Take 50 mcg by mouth in the morning.   melatonin 5 MG TABS Take 5 mg by mouth daily.   memantine  (NAMENDA ) 5 MG tablet Take 1 tablet (5 mg total) by mouth 2 (two) times daily. Start with 5mg  daily for 1 week and then increase to 5mg  BID (Patient taking differently: Take 5 mg by mouth 2 (two)  times daily.)   polyethylene glycol (MIRALAX / GLYCOLAX) 17 g packet Take 17 g by mouth daily.   traZODone  (DESYREL ) 50 MG tablet Take 50 mg by mouth daily.   No facility-administered encounter medications on file as of 03/14/2024.    Review of Systems  Immunization History  Administered Date(s) Administered   DTaP 09/30/1995   Influenza-Unspecified 04/12/2023   Moderna SARS-COV2 Booster Vaccination 03/25/2020, 11/25/2021   Moderna Sars-Covid-2 Vaccination 07/02/2019, 07/16/2019, 11/19/2020   Respiratory Syncytial Virus Vaccine,Recomb Aduvanted(Arexvy) 06/26/2022   Tdap 06/15/2015   Unspecified SARS-COV-2 Vaccination 04/20/2023   Pertinent  Health Maintenance Due  Topic Date Due   Influenza Vaccine  01/13/2024   DEXA SCAN  Completed      06/25/2022   10:22 AM 06/25/2022    1:53 PM 08/25/2022   12:58 PM 06/27/2023   12:22 PM 10/14/2023   10:55 AM  Fall Risk  Falls in the past year? 0 0 1 1 1   Was there an injury with Fall? 0 0 1 1 1   Fall Risk Category Calculator 0 0 2 2 2   Fall Risk Category (Retired) Low  Low      (RETIRED) Patient Fall Risk Level High fall risk  High fall risk      Patient at Risk for Falls Due to History of fall(s) History of fall(s);Impaired balance/gait;Impaired mobility History of fall(s);Impaired balance/gait;Impaired mobility History of fall(s);Impaired balance/gait History of fall(s);Impaired balance/gait  Fall risk Follow up Falls evaluation completed  Falls  evaluation completed;Education provided  Falls evaluation completed;Education provided Falls evaluation completed;Education provided Falls evaluation completed;Education provided     Data saved with a previous flowsheet row definition   Functional Status Survey:    Vitals:   03/14/24 1406  BP: 138/66  Pulse: 60  Temp: 97.7 F (36.5 C)  Weight: 181 lb (82.1 kg)   Body mass index is 30.12 kg/m. Physical Exam Vitals reviewed.  Constitutional:      General: She is not in acute distress.    Appearance: She is not ill-appearing.  HENT:     Head: Normocephalic.     Right Ear: Tympanic membrane normal. There is no impacted cerumen.     Left Ear: Tympanic membrane normal. There is no impacted cerumen.     Nose: Nose normal. No rhinorrhea.     Mouth/Throat:     Mouth: Mucous membranes are moist.     Pharynx: No posterior oropharyngeal erythema.  Eyes:     General:        Right eye: No discharge.        Left eye: No discharge.  Cardiovascular:     Rate and Rhythm: Normal rate and regular rhythm.     Pulses: Normal pulses.     Heart sounds: Murmur heard.  Pulmonary:     Effort: Pulmonary effort is normal.     Breath sounds: Examination of the right-middle field reveals decreased breath sounds. Examination of the left-middle field reveals decreased breath sounds. Decreased breath sounds present.  Abdominal:     General: Bowel sounds are normal.     Palpations: Abdomen is soft.  Musculoskeletal:     Cervical back: Neck supple.     Right lower leg: No edema.     Left lower leg: No edema.  Lymphadenopathy:     Cervical: No cervical adenopathy.  Skin:    General: Skin is warm.     Capillary Refill: Capillary refill takes less than 2 seconds.  Neurological:     General:  No focal deficit present.     Mental Status: She is alert. Mental status is at baseline.     Gait: Gait abnormal.  Psychiatric:        Mood and Affect: Mood normal.     Labs reviewed: Recent Labs     07/04/23 0000 10/30/23 1500 10/30/23 1502  NA 143 139 140  K 5.0 4.0 4.0  CL 104 102 100  CO2 30* 27  --   GLUCOSE  --  143* 141*  BUN 27* 25* 29*  CREATININE 1.0 1.25* 1.20*  CALCIUM  8.6* 8.6*  --    Recent Labs    07/04/23 0000 10/30/23 1500  AST 19 22  ALT 12 18  ALKPHOS 53 54  BILITOT  --  0.7  PROT  --  6.1*  ALBUMIN 3.7 3.4*   Recent Labs    07/04/23 0000 10/30/23 1500 10/30/23 1502  WBC 6.1 7.2  --   NEUTROABS  --  4.1  --   HGB 13.2 13.9 14.3  HCT 40 42.0 42.0  MCV  --  99.5  --   PLT 201 209  --    Lab Results  Component Value Date   TSH 0.80 11/23/2022   Lab Results  Component Value Date   HGBA1C 6.7 11/23/2022   Lab Results  Component Value Date   CHOL 105 11/20/2022   HDL 39 11/20/2022   LDLCALC 47 11/20/2022   LDLDIRECT 135.1 08/26/2010   TRIG 100 11/20/2022   CHOLHDL 3.9 04/17/2022    Significant Diagnostic Results in last 30 days:  No results found.  Assessment/Plan 1. Sore throat (Primary) - onset 09/29 - exam unremarkable - able to swallow fluids/foods - covid was negative - suspect viral cold, start supplements to boost immunity - start vitamin 1000 mg po daily x 7 days - start vitamin D  2000 units po daily x 7 days  - start zinc  50 mg po daily x 7 days   2. Acute cough - see above - afebrile - middle lobes mildly diminished  - 09/29 CXR ordered> pending  3. External hemorrhoids - start Anusol  2.5% BID x 10 days then BID PRN x 1 month    Family/ staff Communication: plan discussed with patient an nurse  Labs/tests ordered:  CXR pending

## 2024-04-10 ENCOUNTER — Encounter: Payer: Self-pay | Admitting: Adult Health

## 2024-04-12 ENCOUNTER — Encounter: Payer: Self-pay | Admitting: Neurology

## 2024-04-12 ENCOUNTER — Ambulatory Visit: Admitting: Neurology

## 2024-04-12 VITALS — BP 124/61 | HR 47 | Resp 16 | Ht 65.0 in

## 2024-04-12 DIAGNOSIS — Z8673 Personal history of transient ischemic attack (TIA), and cerebral infarction without residual deficits: Secondary | ICD-10-CM

## 2024-04-12 DIAGNOSIS — G3184 Mild cognitive impairment, so stated: Secondary | ICD-10-CM

## 2024-04-12 MED ORDER — MEMANTINE HCL 10 MG PO TABS: 10.0000 mg | ORAL_TABLET | Freq: Two times a day (BID) | ORAL | 5 refills | Status: AC

## 2024-04-12 NOTE — Progress Notes (Signed)
 [facility] [address] [place]. [place] [address] [phone]       OFFICE FOLLOW UP NOTE  [name] Date of Birth:  December 06, 1929 Medical Record Number:  [id number]    Reason for visit: New strokelike symptoms    SUBJECTIVE:   CHIEF COMPLAINT:  Chief Complaint  Patient presents with   Cerebrovascular Accident    Rm17, daughter/POA PRESENT, STROKE worsening symptoms    HPI:  Update 04/12/2024 : Patient returns for follow-up after last visit with [facility] nurse practitioner 9 months ago.  She is accompanied by her daughter.  She is tolerating Namenda  but is only on 5 mg twice daily.  She has good days and bad days.  She has very poor short-term memory and daughter feels it may be getting worse.  There are days when she forgets the names of family members and gets quite frustrated.  She she does not have any delusions or hallucinations but gets irritated and frustrated quite easily.  She can ambulate short distances outside but uses a wheeled walker.  She has had no falls or injuries.  On cognitive testing today she scored 28/30 which is stable from last visit.  She was admitted to [facility] on 10/30/2023 with sudden onset of dizziness and aphasia and altered mental status.  She was thought to have a stroke however MRI was negative for acute stroke.  CT angiogram showed right P2/P3 occlusion but there is no infarct with that.  Old left MCA as well as left subinsular infarcts were noted.  Metabolic panel labs suggested mild dehydration and impaired renal function.  UA was negative for infection and white count was not elevated. Patient recovered after a day or so.  On functional activity questionnaire she is dependent on most activities of daily living.  On depression scale she is not depressed.  She has no new complaints today. Update 07/04/2023 JM: Patient is being seen per daughter request due to recent strokelike symptoms.  She is accompanied by her  daughter.  She was previously seen over 1 year ago for stroke follow-up and doing well with residual aphasia and mild cognitive difficulties but gradually improving.  She was advised to follow-up as needed.  Continues to reside at [facility] assisted living.  She was found on the floor on 1/13 during routine nursing checks, she was complaining of ankle pain.  Nursing reported increased nasal congestion and fever x3 days, COVID and flu negative  Daughter reports the following day she noticed slurred speech, right-sided facial weakness, worsening cognition and worsening conversational acuity. Speech has since returned to baseline and no longer having facial weakness, lasted about 48 hours. She continues to have some cognitive decline per daughter, has been gradual since her stroke in 2023 with more noticeable progression over the past week. MMSE today 22/30 (prior 30/30 06/2022).  No behavioral concerns.  She has been compliant on Plavix  and atorvastatin .  Prior lab work in 11/2022 showed LDL 47 and A1c 6.7.    Does have history of OSA, daughter concerned regarding intermittent use of CPAP, has tried to have [facility] be more diligent on ensuring she is using nightly but this is deemed to be difficult.  Patient is currently using a nasal pillow mask, tolerating mask okay but overall dislikes using CPAP.  Prior sleep study completed in 2021 showing overall mild sleep apnea and received new AutoPap machine.  Has not had a follow-up specifically for CPAP since that time.  History provided for reference purposes only Initial visit 05/19/2022 JM: Patient is being seen for initial hospital follow-up unaccompanied.  She has been working with speech therapy at [facility] for residual aphasia and cognitive difficulties.  Does report some improvement since discharge.  Denies any physical deficits.  Ambulates with rolling walker at baseline, denies any recent falls.  Remains on both aspirin  and Plavix  as  well as atorvastatin  Blood pressure well controlled Heart monitor - will be completed Dec 11th She has since had follow-up with PCP  Hospitalization 04/16/2022 [name] is a 89 y.o. female with history of mild aortic stenosis, hypertension, hyperlipidemia, obstructive sleep apnea on CPAP, prior history of TIA 2016 without residual deficits who presented to the emergency room on 04/16/2022 for episode of confusion and word finding difficulty.  Evaluated by Dr. Rosemarie for acute left MCA ischemic infarcts of cryptogenic etiology.  CTA head/neck showed acute left M2 occlusion. EF 70 to 75%.  LDL 115.  A1c 6.1.  Recommended DAPT for 3 weeks then Plavix  alone and increased home dose atorvastatin  from 20 mg to 40 mg daily.  Recommended outpatient cardiac monitor to evaluate for A-fib.  Discharged home in stable condition without therapy needs.       ROS:   14 system review of systems performed and negative with exception of those listed in HPI  PMH:  Past Medical History:  Diagnosis Date   Allergy    Aortic stenosis    a. mild to mod by Echo 07/2012   Arthritis    Asymptomatic varicose veins    Benign paroxysmal vertigo    Bradycardia, unspecified    Bronchitis, not specified as acute or chronic    Cardiac murmur    Cataracts, bilateral    Chest pain, unspecified    Chronic cystitis    Depression    Diarrhea    Dyspnea    Dysuria    Frequency of micturition    Frequent urination    Gait disturbance 06/10/2014   Gastric ulcer    Hearing loss    Hemorrhoids    History of bone density study    History of colonoscopy    History of frequent urinary tract infections    History of urinary tract infection    Hx of cardiovascular stress test    a. ETT-Echo 3/12:  EF 60%, normal study   Hx of echocardiogram    a. Echo 2/14:  Mild LVH, EF 60-65%, Gr 1 diast dysfn, mild to mod AS, mean 17 mmHg, AVA 1.3 (VTI), trivial MR, mild LAE, PASP 31    Hyperlipemia    Hypertension     Hypothyroid    Left knee pain    Leg pain    Low back pain    Obstructive sleep apnea    OSA on CPAP    Other disorders of intestinal carbohydrate absorption    Other type of osteoarthritis, unspecified site    Overweight    Pain in thoracic spine    Palpitations    a. event monitor 3/14:  NSR, sinus brady   Plantar fasciitis    Plantar fasciitis    Right shoulder pain    S/P cholecystectomy    S/P TAH-BSO    Sleep apnea    Stroke (HCC)    tia 2016   TIA (transient ischemic attack)    Varicose veins of unspecified lower extremity with inflammation     PSH:  Past Surgical History:  Procedure Laterality Date  ABDOMINAL HYSTERECTOMY     CHOLECYSTECTOMY     EP IMPLANTABLE DEVICE N/A 06/29/2016   Procedure: Loop Recorder Insertion;  Surgeon: Will Gladis Norton, MD;  Location: [facility] INVASIVE CV [facility];  Service: Cardiovascular;  Laterality: N/A;   FOOT SURGERY  1955   INCONTINENCE SURGERY     LOOP RECORDER REMOVAL N/A 08/10/2017   Procedure: LOOP RECORDER REMOVAL;  Surgeon: Norton Soyla Gladis, MD;  Location: [facility] INVASIVE CV [facility];  Service: Cardiovascular;  Laterality: N/A;   TONSILLECTOMY      Social History:  Social History   Socioeconomic History   Marital status: Widowed    Spouse name: Not on file   Number of children: 2   Years of education: college gr   Highest education level: Not on file  Occupational History   Occupation: retired runner, broadcasting/film/video  Tobacco Use   Smoking status: Former    Types: Cigarettes   Smokeless tobacco: Never  Vaping Use   Vaping status: Never Used  Substance and Sexual Activity   Alcohol  use: Yes    Alcohol /week: 1.0 standard drink of alcohol     Types: 1 Glasses of wine per week    Comment: wine sometimes   Drug use: No   Sexual activity: Not on file  Other Topics Concern   Not on file  Social History Narrative   Diet:      Caffeine:  2-3 cups of caffeine daily- decaf coffee.      Married, if yes what year: widow. Married 1955       Do  you live in a house, apartment, assisted living, condo, trailer, ect: [facility], independent living.   Is it one or more stories: one      How many persons live in your home? 1      Pets:cno      Highest level or education completed: college degree      Current/Past profession: retired engineer, site      Exercise:  yes                Type and how often: daily         Living Will: Yes   DNR: Yes   POA/HPOA: Yes      Functional Status:   Do you have difficulty bathing or dressing yourself? No   Do you have difficulty preparing food or eating? No   Do you have difficulty managing your medications? No   Do you have difficulty managing your finances? No   Do you have difficulty affording your medications? No      Patient is right handed.    Social Drivers of Corporate Investment Banker Strain: Low Risk  (10/14/2023)   Overall Financial Resource Strain (CARDIA)    Difficulty of Paying Living Expenses: Not hard at all  Food Insecurity: No Food Insecurity (10/14/2023)   Hunger Vital Sign    Worried About Running Out of Food in the Last Year: Never true    Ran Out of Food in the Last Year: Never true  Transportation Needs: No Transportation Needs (10/14/2023)   PRAPARE - Administrator, Civil Service (Medical): No    Lack of Transportation (Non-Medical): No  Physical Activity: Insufficiently Active (10/14/2023)   Exercise Vital Sign    Days of Exercise per Week: 3 days    Minutes of Exercise per Session: 30 min  Stress: No Stress Concern Present (10/14/2023)   [employer] of Occupational Health - Occupational Stress Questionnaire  Feeling of Stress : Only a little  Social Connections: Socially Isolated (10/14/2023)   Social Connection and Isolation Panel    Frequency of Communication with Friends and Family: Twice a week    Frequency of Social Gatherings with Friends and Family: Once a week    Attends Religious Services: Never    Database Administrator or  Organizations: No    Attends Banker Meetings: Never    Marital Status: Widowed  Intimate Partner Violence: Not At Risk (10/14/2023)   Humiliation, Afraid, Rape, and Kick questionnaire    Fear of Current or Ex-Partner: No    Emotionally Abused: No    Physically Abused: No    Sexually Abused: No    Family History:  Family History  Problem Relation Age of Onset   Heart attack Mother 51   Heart failure Mother    Arthritis Mother    Lung disease Father    Cancer - Prostate Father    Healthy Brother    Healthy Brother    Heart failure Maternal Uncle    Cancer Son     Medications:   Current Outpatient Medications on File Prior to Visit  Medication Sig Dispense Refill   acetaminophen  (TYLENOL ) 500 MG tablet Take 1 tablet (500 mg total) by mouth every 8 (eight) hours as needed for moderate pain. 30 tablet 0   albuterol (PROVENTIL) (2.5 MG/3ML) 0.083% nebulizer solution Take 2.5 mg by nebulization every 6 (six) hours as needed for wheezing or shortness of breath.     atorvastatin  (LIPITOR) 40 MG tablet Take 1 tablet (40 mg total) by mouth daily. 30 tablet 1   carboxymethylcellulose (REFRESH PLUS) 0.5 % SOLN Place 2 drops into both eyes daily as needed.     clopidogrel  (PLAVIX ) 75 MG tablet Take 1 tablet (75 mg total) by mouth daily. 30 tablet 3   escitalopram  (LEXAPRO ) 10 MG tablet Take 15 mg by mouth at bedtime.     felodipine  (PLENDIL ) 5 MG 24 hr tablet Take 5 mg by mouth daily.     GUAIFENESIN PO Take 10 mLs by mouth every 6 (six) hours as needed.     hydrocortisone  (ANUSOL -HC) 2.5 % rectal cream Place 1 Application rectally 2 (two) times daily as needed for hemorrhoids or anal itching.     indapamide  (LOZOL ) 2.5 MG tablet Take 2.5 mg by mouth daily.     KETOCONAZOLE, TOPICAL, 1 % SHAM Apply 1 Application topically 2 (two) times a week. Tuesday & Friday     Lactobacillus-Inulin (CULTURELLE DIGESTIVE HEALTH PO) Take 500 mg by mouth as needed (for loose stool or antibiotic  use twice daily as needed.).     levothyroxine  (SYNTHROID , LEVOTHROID) 50 MCG tablet Take 50 mcg by mouth in the morning.     melatonin 5 MG TABS Take 5 mg by mouth daily.     memantine  (NAMENDA ) 5 MG tablet Take 1 tablet (5 mg total) by mouth 2 (two) times daily. Start with 5mg  daily for 1 week and then increase to 5mg  BID 60 tablet 5   polyethylene glycol (MIRALAX / GLYCOLAX) 17 g packet Take 17 g by mouth daily.     traZODone  (DESYREL ) 50 MG tablet Take 50 mg by mouth daily.     No current facility-administered medications on file prior to visit.    Allergies:   Allergies  Allergen Reactions   Bee Venom Other (See Comments)    Unknown reaction Per facility    Cipro [Ciprofloxacin Hcl] Other (  See Comments)    Unknown reaction  Per facility   Crestor [Rosuvastatin] Other (See Comments)    Myalgias    Lodine [Etodolac] Other (See Comments)    Unknown reaction Per facility   Naprosyn [Naproxen] Other (See Comments)    Unknown reaction Per facility   Sulfonamide Derivatives Other (See Comments)    Unknown reaction Per facility   Tositumomab    Epipen  [Epinephrine ] Palpitations   Macrobid [Nitrofurantoin Macrocrystal] Rash   Metrocream [Metronidazole] Rash   Xanax [Alprazolam] Rash      OBJECTIVE:  Physical Exam  Vitals:   04/12/24 1615  BP: 124/61  Pulse: (!) 47  Resp: 16  Height: 5' 5 (1.651 m)   Body mass index is 30.12 kg/m. No results found.   General: well developed, well nourished, very pleasant elderly Caucasian female, seated, in no evident distress Head: head normocephalic and atraumatic.   Neck: supple with no carotid or supraclavicular bruits Cardiovascular: regular rate and rhythm  Neurologic Exam Mental Status: Awake and fully alert.  Unable to appreciate dysarthria or aphasia. Attention span, concentration and fund of knowledge mildly impaired, difficulty with comprehension. Mood and affect appropriate.  Diminished short-term memory and  recall.  MMSE 28/30.  Functional activity?  27 highly dependent.  Sporadic depression scale not depressed.  Able to copy intersecting pentagons. Cranial Nerves: Pupils equal, briskly reactive to light. Extraocular movements full without nystagmus. Visual fields full to confrontation.  HOH bilaterally. Facial sensation intact. Face, tongue, palate moves normally and symmetrically.  Motor: Normal bulk and tone. Normal strength in all tested extremity muscles Sensory.: intact to touch , pinprick , position and vibratory sensation.  Coordination: Rapid alternating movements normal in all extremities. Finger-to-nose and heel-to-shin performed accurately bilaterally. Gait and Station: Arises from chair without difficulty. Stance is slightly hunched.  Gait demonstrates normal stride length and balance with use of rolling walker.  Reflexes: 1+ and symmetric. Toes downgoing.      04/12/2024    4:06 PM 10/14/2023   10:41 AM 07/04/2023   11:13 AM  MMSE - Mini Mental State Exam  Orientation to time 5 4 4   Orientation to Place 3 5 3   Registration 3 3 3   Attention/ Calculation 5 5 1   Recall 3 3 2   Language- name 2 objects 2 2 2   Language- repeat 1 1 1   Language- follow 3 step command 3 3 3   Language- read & follow direction 1 1 1   Write a sentence 1 1 1   Copy design 1 0 1  Total score 28 28 22         ASSESSMENT: Autumn Allen is a 89 y.o. year old female with hx of left MCA ischemic infarcts on 04/16/2022 of cryptogenic etiology. Vascular risk factors include HLD, history of TIA, CAD and OSA on CPAP. Patient suffered a fall on 06/27/2023 and started to experience slurred speech, facial weakness and cognitive worsening the following day, speech and facial weakness resolved within 48 hours was progressed from mild cognitive impairment to mild mixed dementia.   PLAN:  I had a long discussion with the patient regards to her history of stroke and her mild cognitive impairment which persists but she  is tolerating Namenda  well without side effects and so recommend we increase the dose to 10 mg twice daily.  I also encouraged her to increase participation in cognitive challenging activities like solving crossword puzzles, playing bridge and sudoku.  She was encouraged to use a wheeled walker at all times  discussed fall safety precautions.  Continue Plavix  for stroke prevention and maintain aggressive risk factor modification strict control of hypertension with blood pressure goal below 130/90, lipids with LDL cholesterol goal below 70 mg percent and diabetes with hemoglobin A1c goal below 6.5%.  On CPAP for sleep apnea.  The patient is clearly unable to make her own healthcare decisions due to her cognitive impairment and it would be appropriate in my opinion for her daughter to be designated healthcare power of attorney.  Return for follow-up in the future in 6 months with nurse practitioner call earlier if necessary.   I personally spent a total of 50 minutes in the care of the patient today including getting/reviewing separately obtained history, performing a medically appropriate exam/evaluation, counseling and educating, placing orders, referring and communicating with other health care professionals, documenting clinical information in the EHR, independently interpreting results, and coordinating care.        CC:  [facility] provider: Dr. Rosemarie PCP: Charlanne Fredia CROME, MD        Eather Rosemarie, MD  [facility] [address] [place], [place] [address]  Phone [phone] Fax [phone] Note: This document was prepared with digital dictation and possible smart phrase technology. Any transcriptional errors that result from this process are unintentional.

## 2024-04-12 NOTE — Patient Instructions (Signed)
 I had a long discussion with the patient regards to her history of stroke and her mild cognitive impairment which persists but she is tolerating Namenda  well without side effects and so recommend we increase the dose to 10 mg twice daily.  I also encouraged her to increase participation in cognitive challenging activities like solving crossword puzzles, playing bridge and sudoku.  She was encouraged to use a wheeled walker at all times discussed fall safety precautions.  Continue Plavix  for stroke prevention and maintain aggressive risk factor modification strict control of hypertension with blood pressure goal below 130/90, lipids with LDL cholesterol goal below 70 mg percent and diabetes with hemoglobin A1c goal below 6.5%.  On CPAP for sleep apnea.  The patient is clearly unable to make her own healthcare decisions due to her cognitive impairment and it would be appropriate in my opinion for her daughter to be designated healthcare power of attorney.  Return for follow-up in the future in 6 months with nurse practitioner call earlier if necessary.

## 2024-04-16 ENCOUNTER — Encounter: Payer: Self-pay | Admitting: Adult Health

## 2024-04-16 ENCOUNTER — Non-Acute Institutional Stay: Payer: Self-pay | Admitting: Adult Health

## 2024-04-16 DIAGNOSIS — L03113 Cellulitis of right upper limb: Secondary | ICD-10-CM | POA: Diagnosis not present

## 2024-04-16 DIAGNOSIS — G3184 Mild cognitive impairment, so stated: Secondary | ICD-10-CM

## 2024-04-16 NOTE — Progress Notes (Signed)
 Location:   [facility] Curator of Service:   ALF Provider:  Medina-Vargas, Jarely Juncaj, DNP, FNP-BC  Patient Care Team: Charlanne Fredia CROME, MD as PCP - General (Internal Medicine) Inocencio Soyla Lunger, MD as PCP - Cardiology (Cardiology) Charmayne Molly, MD as Consulting Physician (Ophthalmology) Delana Ted Morrison, DO as Consulting Physician (Obstetrics and Gynecology) Dohmeier, Dedra, MD as Consulting Physician (Neurology) Jesus Oliphant, MD as Consulting Physician (Otolaryngology)  Extended Emergency Contact Information Primary Emergency Contact: SHANA GWYNNETH GULL Address: [address] - healthcare and general Power of [place], docs on file          [place], [place] [address] [place] Mobile Phone: [phone] Relation: Daughter Secondary Emergency Contact: [facility] Address: [address]          [place], [place] [address] [place] Phone: [phone] Relation: Friend  Code Status:   DNR  Goals of care: Advanced Directive information    10/30/2023    3:20 PM  Advanced Directives  Does Patient Have a Medical Advance Directive? Unable to assess, patient is non-responsive or altered mental status     Chief Complaint  Patient presents with   Acute Visit    Right upper arm redness    HPI:  Pt is a 89 y.o. female seen today for an acute visit regarding redness on her right upper arm. She is a resident of [facility] [address] [facility]. She was noted to have redness on her right upper arm. Area is slightly indurated, warm to touch, erythematous, measuring 5 X 3 cm. She stated that it is slightly sensitive to touch. She had a COVID-19 vaccine on the right upper arm on 03/12/24.  She was seen in the room today with a friend who was visiting her.  Past Medical History:  Diagnosis Date   Allergy    Aortic stenosis    a. mild to mod by Echo 07/2012   Arthritis    Asymptomatic varicose veins    Benign paroxysmal vertigo    Bradycardia,  unspecified    Bronchitis, not specified as acute or chronic    Cardiac murmur    Cataracts, bilateral    Chest pain, unspecified    Chronic cystitis    Depression    Diarrhea    Dyspnea    Dysuria    Frequency of micturition    Frequent urination    Gait disturbance 06/10/2014   Gastric ulcer    Hearing loss    Hemorrhoids    History of bone density study    History of colonoscopy    History of frequent urinary tract infections    History of urinary tract infection    Hx of cardiovascular stress test    a. ETT-Echo 3/12:  EF 60%, normal study   Hx of echocardiogram    a. Echo 2/14:  Mild LVH, EF 60-65%, Gr 1 diast dysfn, mild to mod AS, mean 17 mmHg, AVA 1.3 (VTI), trivial MR, mild LAE, PASP 31    Hyperlipemia    Hypertension    Hypothyroid    Left knee pain    Leg pain    Low back pain    Obstructive sleep apnea    OSA on CPAP    Other disorders of intestinal carbohydrate absorption    Other type of osteoarthritis, unspecified site    Overweight    Pain in thoracic spine    Palpitations    a. event monitor 3/14:  NSR, sinus  brady   Plantar fasciitis    Plantar fasciitis    Right shoulder pain    S/P cholecystectomy    S/P TAH-BSO    Sleep apnea    Stroke [facility])    tia 2016   TIA (transient ischemic attack)    Varicose veins of unspecified lower extremity with inflammation    Past Surgical History:  Procedure Laterality Date   ABDOMINAL HYSTERECTOMY     CHOLECYSTECTOMY     EP IMPLANTABLE DEVICE N/A 06/29/2016   Procedure: Loop Recorder Insertion;  Surgeon: Will Gladis Norton, MD;  [facility];  Service: Cardiovascular;  Laterality: N/A;   FOOT SURGERY  1955   INCONTINENCE SURGERY     LOOP RECORDER REMOVAL N/A 08/10/2017   Procedure: LOOP RECORDER REMOVAL;  Surgeon: Norton Soyla Gladis, MD;  Location: [facility];  Service: Cardiovascular;  Laterality: N/A;   TONSILLECTOMY      Allergies  Allergen Reactions   Bee Venom Other (See  Comments)    Unknown reaction Per facility    Cipro [Ciprofloxacin Hcl] Other (See Comments)    Unknown reaction  Per facility   Crestor [Rosuvastatin] Other (See Comments)    Myalgias    Lodine [Etodolac] Other (See Comments)    Unknown reaction Per facility   Naprosyn [Naproxen] Other (See Comments)    Unknown reaction Per facility   Sulfonamide Derivatives Other (See Comments)    Unknown reaction Per facility   Tositumomab    Epipen  [Epinephrine ] Palpitations   Macrobid [Nitrofurantoin Macrocrystal] Rash   Metrocream [Metronidazole] Rash   Xanax [Alprazolam] Rash    Outpatient Encounter Medications as of 04/16/2024  Medication Sig   acetaminophen  (TYLENOL ) 500 MG tablet Take 1 tablet (500 mg total) by mouth every 8 (eight) hours as needed for moderate pain.   albuterol (PROVENTIL) (2.5 MG/3ML) 0.083% nebulizer solution Take 2.5 mg by nebulization every 6 (six) hours as needed for wheezing or shortness of breath.   atorvastatin  (LIPITOR) 40 MG tablet Take 1 tablet (40 mg total) by mouth daily.   carboxymethylcellulose (REFRESH PLUS) 0.5 % SOLN Place 2 drops into both eyes daily as needed.   clopidogrel  (PLAVIX ) 75 MG tablet Take 1 tablet (75 mg total) by mouth daily.   escitalopram  (LEXAPRO ) 10 MG tablet Take 15 mg by mouth at bedtime.   felodipine  (PLENDIL ) 5 MG 24 hr tablet Take 5 mg by mouth daily.   GUAIFENESIN PO Take 10 mLs by mouth every 6 (six) hours as needed.   hydrocortisone  (ANUSOL -HC) 2.5 % rectal cream Place 1 Application rectally 2 (two) times daily as needed for hemorrhoids or anal itching.   indapamide  (LOZOL ) 2.5 MG tablet Take 2.5 mg by mouth daily.   KETOCONAZOLE, TOPICAL, 1 % SHAM Apply 1 Application topically 2 (two) times a week. Tuesday & Friday   Lactobacillus-Inulin (CULTURELLE DIGESTIVE HEALTH PO) Take 500 mg by mouth as needed (for loose stool or antibiotic use twice daily as needed.).   levothyroxine  (SYNTHROID , LEVOTHROID) 50 MCG tablet Take 50 mcg  by mouth in the morning.   melatonin 5 MG TABS Take 5 mg by mouth daily.   memantine  (NAMENDA ) 10 MG tablet Take 1 tablet (10 mg total) by mouth 2 (two) times daily. Start with 5mg  daily for 1 week and then increase to 5mg  BID   polyethylene glycol (MIRALAX / GLYCOLAX) 17 g packet Take 17 g by mouth daily.   traZODone  (DESYREL ) 50 MG tablet Take 50 mg by mouth daily.  No facility-administered encounter medications on file as of 04/16/2024.    Review of Systems  Constitutional:  Negative for appetite change, chills, fatigue and fever.  HENT:  Negative for congestion, hearing loss, rhinorrhea and sore throat.   Eyes: Negative.   Respiratory:  Negative for cough, shortness of breath and wheezing.   Cardiovascular:  Negative for chest pain, palpitations and leg swelling.  Gastrointestinal:  Negative for abdominal pain, constipation, diarrhea, nausea and vomiting.  Genitourinary:  Negative for dysuria.  Musculoskeletal:  Negative for arthralgias, back pain and myalgias.  Skin:  Negative for color change, rash and wound.       Right upper arm redness  Neurological:  Negative for dizziness, weakness and headaches.  Psychiatric/Behavioral:  Negative for behavioral problems. The patient is not nervous/anxious.      Immunization History  Administered Date(s) Administered   DTaP 09/30/1995   Influenza-Unspecified 04/12/2023   Moderna SARS-COV2 Booster Vaccination 03/25/2020, 11/25/2021   Moderna Sars-Covid-2 Vaccination 07/02/2019, 07/16/2019, 11/19/2020   Respiratory Syncytial Virus Vaccine,Recomb Aduvanted(Arexvy) 06/26/2022   Tdap 06/15/2015   Unspecified SARS-COV-2 Vaccination 04/20/2023   Pertinent  Health Maintenance Due  Topic Date Due   Influenza Vaccine  01/13/2024   DEXA SCAN  Completed      06/25/2022    1:53 PM 08/25/2022   12:58 PM 06/27/2023   12:22 PM 10/14/2023   10:55 AM 03/14/2024    2:52 PM  Fall Risk  Falls in the past year? 0 1 1 1 1   Was there an injury with  Fall? 0 1 1 1  0  Fall Risk Category Calculator 0 2 2 2 1   Fall Risk Category (Retired) Low       (RETIRED) Patient Fall Risk Level High fall risk       Patient at Risk for Falls Due to History of fall(s);Impaired balance/gait;Impaired mobility History of fall(s);Impaired balance/gait;Impaired mobility History of fall(s);Impaired balance/gait History of fall(s);Impaired balance/gait History of fall(s);Impaired balance/gait  Fall risk Follow up Falls evaluation completed;Education provided  Falls evaluation completed;Education provided Falls evaluation completed;Education provided Falls evaluation completed;Education provided Falls evaluation completed     Data saved with a previous flowsheet row definition     Vitals:   04/16/24 1727  BP: 134/66  Pulse: 74  Resp: 16  Temp: 97.9 F (36.6 C)  SpO2: 98%  Weight: 181 lb 6.4 oz (82.3 kg)  Height: 5' 8 (1.727 m)   Body mass index is 27.58 kg/m.  Physical Exam Constitutional:      Appearance: Normal appearance.  HENT:     Head: Normocephalic and atraumatic.     Nose: Nose normal.     Mouth/Throat:     Mouth: Mucous membranes are moist.  Eyes:     Conjunctiva/sclera: Conjunctivae normal.  Cardiovascular:     Rate and Rhythm: Normal rate and regular rhythm.  Pulmonary:     Effort: Pulmonary effort is normal.     Breath sounds: Normal breath sounds.  Abdominal:     General: Bowel sounds are normal.     Palpations: Abdomen is soft.  Musculoskeletal:        General: Normal range of motion.     Cervical back: Normal range of motion.  Skin:    General: Skin is warm and dry.     Findings: Erythema present.     Comments: Right upper arm redness, indurated, 5 X 3 cm  Neurological:     General: No focal deficit present.     Mental Status: She  is alert and oriented to person, place, and time.  Psychiatric:        Mood and Affect: Mood normal.        Behavior: Behavior normal.        Thought Content: Thought content normal.         Judgment: Judgment normal.      Labs reviewed: Recent Labs    07/04/23 0000 10/30/23 1500 10/30/23 1502  NA 143 139 140  K 5.0 4.0 4.0  CL 104 102 100  CO2 30* 27  --   GLUCOSE  --  143* 141*  BUN 27* 25* 29*  CREATININE 1.0 1.25* 1.20*  CALCIUM  8.6* 8.6*  --    Recent Labs    07/04/23 0000 10/30/23 1500  AST 19 22  ALT 12 18  ALKPHOS 53 54  BILITOT  --  0.7  PROT  --  6.1*  ALBUMIN 3.7 3.4*   Recent Labs    07/04/23 0000 10/30/23 1500 10/30/23 1502  WBC 6.1 7.2  --   NEUTROABS  --  4.1  --   HGB 13.2 13.9 14.3  HCT 40 42.0 42.0  MCV  --  99.5  --   PLT 201 209  --    Lab Results  Component Value Date   TSH 0.80 11/23/2022   Lab Results  Component Value Date   HGBA1C 6.7 11/23/2022   Lab Results  Component Value Date   CHOL 105 11/20/2022   HDL 39 11/20/2022   LDLCALC 47 11/20/2022   LDLDIRECT 135.1 08/26/2010   TRIG 100 11/20/2022   CHOLHDL 3.9 04/17/2022    Significant Diagnostic Results in last 30 days:  No results found.  Assessment/Plan  1. Cellulitis of right upper extremity (Primary) -   -  post COVID-19 injection 5 days ago -  redness on her right upper arm. Area is slightly indurated, warm to touch, erythematous, measuring 5 X 3 cm.  -  will start on cefadroxil 500 mg every 12 hours x 5 days - Apply Warm compress to right upper arm twice a day x 3 days  2. Mild cognitive impairment -   Continue Namenda  10 mg twice daily   Family/ staff Communication: Discussed plan of care with resident and charge nurse.  Labs/tests ordered: None    Armiyah Capron Medina-Vargas, DNP, MSN, FNP-BC [facility] [phone] (Monday-Friday 8:00 a.m. - 5:00 p.m.) [phone] (after hours)

## 2024-05-23 ENCOUNTER — Encounter: Payer: Self-pay | Admitting: Adult Health

## 2024-05-23 ENCOUNTER — Non-Acute Institutional Stay: Payer: Self-pay | Admitting: Adult Health

## 2024-05-23 DIAGNOSIS — Z8673 Personal history of transient ischemic attack (TIA), and cerebral infarction without residual deficits: Secondary | ICD-10-CM

## 2024-05-23 DIAGNOSIS — I10 Essential (primary) hypertension: Secondary | ICD-10-CM

## 2024-05-23 DIAGNOSIS — J9600 Acute respiratory failure, unspecified whether with hypoxia or hypercapnia: Secondary | ICD-10-CM

## 2024-05-23 DIAGNOSIS — E039 Hypothyroidism, unspecified: Secondary | ICD-10-CM | POA: Diagnosis not present

## 2024-05-23 NOTE — Progress Notes (Unsigned)
 Location:  [facility] Room Number: [facility] [address] [id number] Place of Service:  ALF [phone]) Provider:  Medina-Vargas, Khalil Szczepanik, DNP, FNP-BC  Patient Care Team: Charlanne Fredia CROME, MD as PCP - General (Internal Medicine) Inocencio Soyla Lunger, MD as PCP - Cardiology (Cardiology) Charmayne Molly, MD as Consulting Physician (Ophthalmology) Delana Ted Morrison, DO as Consulting Physician (Obstetrics and Gynecology) Dohmeier, Dedra, MD as Consulting Physician (Neurology) Jesus Oliphant, MD as Consulting Physician (Otolaryngology)  Extended Emergency Contact Information Primary Emergency Contact: SHANA GWYNNETH GULL Address: [address] - healthcare and general Power of [place], docs on file          [place], [place] [address] [place] Phone: [phone] Relation: Daughter Secondary Emergency Contact: PRIODE,AMY Address: [phone] [facility] - secondary Healthcare [address]          [place], [place] [address] [place] Mobile Phone: [phone] Relation: Friend  Code Status:  DNR  Goals of care: Advanced Directive information    10/30/2023    3:20 PM  Advanced Directives  Does Patient Have a Medical Advance Directive? Unable to assess, patient is non-responsive or altered mental status     Chief Complaint  Patient presents with   Acute Visit    Shortness of breath with exertion    HPI:  Pt is a 89 y.o. female seen today for an acute visit for shortness of breath. She is   Past Medical History:  Diagnosis Date   Allergy    Aortic stenosis    a. mild to mod by Echo 07/2012   Arthritis    Asymptomatic varicose veins    Benign paroxysmal vertigo    Bradycardia, unspecified    Bronchitis, not specified as acute or chronic    Cardiac murmur    Cataracts, bilateral    Chest pain, unspecified    Chronic cystitis    Depression    Diarrhea    Dyspnea    Dysuria    Frequency of micturition    Frequent urination    Gait  disturbance 06/10/2014   Gastric ulcer    Hearing loss    Hemorrhoids    History of bone density study    History of colonoscopy    History of frequent urinary tract infections    History of urinary tract infection    Hx of cardiovascular stress test    a. ETT-Echo 3/12:  EF 60%, normal study   Hx of echocardiogram    a. Echo 2/14:  Mild LVH, EF 60-65%, Gr 1 diast dysfn, mild to mod AS, mean 17 mmHg, AVA 1.3 (VTI), trivial MR, mild LAE, PASP 31    Hyperlipemia    Hypertension    Hypothyroid    Left knee pain    Leg pain    Low back pain    Obstructive sleep apnea    OSA on CPAP    Other disorders of intestinal carbohydrate absorption    Other type of osteoarthritis, unspecified site    Overweight    Pain in thoracic spine    Palpitations    a. event monitor 3/14:  NSR, sinus brady   Plantar fasciitis    Plantar fasciitis    Right shoulder pain    S/P cholecystectomy    S/P TAH-BSO    Sleep apnea    Stroke (HCC)    tia 2016   TIA (transient ischemic attack)    Varicose veins of unspecified lower extremity with inflammation  Past Surgical History:  Procedure Laterality Date   ABDOMINAL HYSTERECTOMY     CHOLECYSTECTOMY     EP IMPLANTABLE DEVICE N/A 06/29/2016   Procedure: Loop Recorder Insertion;  Surgeon: Will Gladis Norton, MD;  [facility]: [facility];  Service: Cardiovascular;  Laterality: N/A;   FOOT SURGERY  1955   INCONTINENCE SURGERY     LOOP RECORDER REMOVAL N/A 08/10/2017   Procedure: LOOP RECORDER REMOVAL;  Surgeon: Norton Soyla Gladis, MD;  [facility]: [facility];  Service: Cardiovascular;  Laterality: N/A;   TONSILLECTOMY      Allergies  Allergen Reactions   Bee Venom Other (See Comments)    Unknown reaction Per facility    Cipro [Ciprofloxacin Hcl] Other (See Comments)    Unknown reaction  Per facility   Crestor [Rosuvastatin] Other (See Comments)    Myalgias    Lodine [Etodolac] Other (See Comments)    Unknown reaction Per  facility   Naprosyn [Naproxen] Other (See Comments)    Unknown reaction Per facility   Sulfonamide Derivatives Other (See Comments)    Unknown reaction Per facility   Tositumomab    Epipen  [Epinephrine ] Palpitations   Macrobid [Nitrofurantoin Macrocrystal] Rash   Metrocream [Metronidazole] Rash   Xanax [Alprazolam] Rash    Outpatient Encounter Medications as of 05/23/2024  Medication Sig   acetaminophen  (TYLENOL ) 500 MG tablet Take 1 tablet (500 mg total) by mouth every 8 (eight) hours as needed for moderate pain. (Patient taking differently: Take 500 mg by mouth every 8 (eight) hours as needed for moderate pain (pain score 4-6). Give 2 tablet by mouth every 8 hours as needed for pain)   albuterol (PROVENTIL) (2.5 MG/3ML) 0.083% nebulizer solution Take 2.5 mg by nebulization every 6 (six) hours as needed for wheezing or shortness of breath.   atorvastatin  (LIPITOR) 40 MG tablet Take 1 tablet (40 mg total) by mouth daily.   carboxymethylcellulose (REFRESH PLUS) 0.5 % SOLN Place 2 drops into both eyes daily as needed. (Patient taking differently: Place 2 drops into both eyes 3 (three) times daily. For dry eye)   clopidogrel  (PLAVIX ) 75 MG tablet Take 1 tablet (75 mg total) by mouth daily.   escitalopram  (LEXAPRO ) 10 MG tablet Take 15 mg by mouth at bedtime.   felodipine  (PLENDIL ) 5 MG 24 hr tablet Take 5 mg by mouth daily.   GUAIFENESIN PO Take 10 mLs by mouth every 6 (six) hours as needed.   indapamide  (LOZOL ) 2.5 MG tablet Take 2.5 mg by mouth daily.   KETOCONAZOLE, TOPICAL, 1 % SHAM Apply 1 Application topically 2 (two) times a week. Tuesday & Friday   Lactobacillus-Inulin (CULTURELLE DIGESTIVE HEALTH PO) Take 500 mg by mouth as needed (for loose stool or antibiotic use twice daily as needed.).   levothyroxine  (SYNTHROID , LEVOTHROID) 50 MCG tablet Take 50 mcg by mouth in the morning.   melatonin 5 MG TABS Take 5 mg by mouth daily.   memantine  (NAMENDA ) 10 MG tablet Take 1 tablet (10 mg  total) by mouth 2 (two) times daily. Start with 5mg  daily for 1 week and then increase to 5mg  BID   polyethylene glycol (MIRALAX / GLYCOLAX) 17 g packet Take 17 g by mouth daily.   traZODone  (DESYREL ) 50 MG tablet Take 50 mg by mouth daily.   hydrocortisone  (ANUSOL -HC) 2.5 % rectal cream Place 1 Application rectally 2 (two) times daily as needed for hemorrhoids or anal itching. (Patient not taking: Reported on 05/23/2024)   No facility-administered encounter medications on  file as of 05/23/2024.    Review of Systems ***    Immunization History  Administered Date(s) Administered    sv, Bivalent, Protein Subunit Rsvpref,pf (Abrysvo) 06/26/2022   DTaP 09/30/1995   INFLUENZA, HIGH DOSE SEASONAL PF 04/12/2023   Influenza-Unspecified 06/15/1999, 04/12/2023, 04/12/2023, 03/20/2024   Moderna Covid-19 Vaccine Bivalent Booster 36yrs & up 04/20/2023   Moderna SARS-COV2 Booster Vaccination 03/25/2020, 11/25/2021   Moderna Sars-Covid-2 Vaccination 07/02/2019, 07/16/2019, 11/19/2020   Respiratory Syncytial Virus Vaccine,Recomb Aduvanted(Arexvy) 06/26/2022   Tdap 06/15/2015   Unspecified SARS-COV-2 Vaccination 04/20/2023, 04/10/2024   Pertinent  Health Maintenance Due  Topic Date Due   Influenza Vaccine  Completed   Bone Density Scan  Completed      06/25/2022    1:53 PM 08/25/2022   12:58 PM 06/27/2023   12:22 PM 10/14/2023   10:55 AM 03/14/2024    2:52 PM  Fall Risk  Falls in the past year? 0 1 1 1 1   Was there an injury with Fall? 0  1  1  1   0   Fall Risk Category Calculator 0 2 2 2 1   Fall Risk Category (Retired) Low       (RETIRED) Patient Fall Risk Level High fall risk       Patient at Risk for Falls Due to History of fall(s);Impaired balance/gait;Impaired mobility History of fall(s);Impaired balance/gait;Impaired mobility History of fall(s);Impaired balance/gait History of fall(s);Impaired balance/gait History of fall(s);Impaired balance/gait  Fall risk Follow up Falls evaluation  completed;Education provided  Falls evaluation completed;Education provided Falls evaluation completed;Education provided Falls evaluation completed;Education provided Falls evaluation completed     Data saved with a previous flowsheet row definition     Vitals:   05/23/24 1014  BP: (!) 148/68  Pulse: 60  Resp: 18  Temp: (!) 97.5 F (36.4 C)  SpO2: 96%  Weight: 181 lb 12.8 oz (82.5 kg)  Height: 5' 8 (1.727 m)   Body mass index is 27.64 kg/m.  Physical Exam     Labs reviewed: Recent Labs    07/04/23 0000 10/30/23 1500 10/30/23 1502  NA 143 139 140  K 5.0 4.0 4.0  CL 104 102 100  CO2 30* 27  --   GLUCOSE  --  143* 141*  BUN 27* 25* 29*  CREATININE 1.0 1.25* 1.20*  CALCIUM  8.6* 8.6*  --    Recent Labs    07/04/23 0000 10/30/23 1500  AST 19 22  ALT 12 18  ALKPHOS 53 54  BILITOT  --  0.7  PROT  --  6.1*  ALBUMIN 3.7 3.4*   Recent Labs    07/04/23 0000 10/30/23 1500 10/30/23 1502  WBC 6.1 7.2  --   NEUTROABS  --  4.1  --   HGB 13.2 13.9 14.3  HCT 40 42.0 42.0  MCV  --  99.5  --   PLT 201 209  --    Lab Results  Component Value Date   TSH 0.80 11/23/2022   Lab Results  Component Value Date   HGBA1C 6.7 11/23/2022   Lab Results  Component Value Date   CHOL 105 11/20/2022   HDL 39 11/20/2022   LDLCALC 47 11/20/2022   LDLDIRECT 135.1 08/26/2010   TRIG 100 11/20/2022   CHOLHDL 3.9 04/17/2022    Significant Diagnostic Results in last 30 days:  No results found.  Assessment/Plan ***   Family/ staff Communication: Discussed plan of care with resident and charge nurse  Labs/tests ordered:     Kyrin Garn Medina-Vargas,  DNP, MSN, FNP-BC [facility] [phone] (Monday-Friday 8:00 a.m. - 5:00 p.m.) [phone] (after hours)

## 2024-05-24 ENCOUNTER — Encounter: Payer: Self-pay | Admitting: Nurse Practitioner

## 2024-05-24 DIAGNOSIS — R062 Wheezing: Secondary | ICD-10-CM | POA: Insufficient documentation

## 2024-05-25 ENCOUNTER — Non-Acute Institutional Stay: Payer: Self-pay | Admitting: Family Medicine

## 2024-05-25 DIAGNOSIS — G3184 Mild cognitive impairment, so stated: Secondary | ICD-10-CM | POA: Insufficient documentation

## 2024-05-25 DIAGNOSIS — G4734 Idiopathic sleep related nonobstructive alveolar hypoventilation: Secondary | ICD-10-CM

## 2024-05-25 DIAGNOSIS — H903 Sensorineural hearing loss, bilateral: Secondary | ICD-10-CM

## 2024-05-25 DIAGNOSIS — G4733 Obstructive sleep apnea (adult) (pediatric): Secondary | ICD-10-CM

## 2024-05-25 DIAGNOSIS — I10 Essential (primary) hypertension: Secondary | ICD-10-CM

## 2024-05-25 NOTE — Progress Notes (Signed)
 Provider:  Garnette Pinal, MD Location:      Place of Service:     PCP: Charlanne Fredia CROME, MD Patient Care Team: Charlanne Fredia CROME, MD as PCP - General (Internal Medicine) Inocencio Soyla Lunger, MD as PCP - Cardiology (Cardiology) Charmayne Molly, MD as Consulting Physician (Ophthalmology) Delana Ted Morrison, DO as Consulting Physician (Obstetrics and Gynecology) Dohmeier, Dedra, MD as Consulting Physician (Neurology) Jesus Oliphant, MD as Consulting Physician (Otolaryngology)  Extended Emergency Contact Information Primary Emergency Contact: SHANA GWYNNETH GULL Address: [address] - healthcare and general Power of [place], docs on file          [place], [place] [address] [place] Mobile Phone: [phone] Relation: Daughter Secondary Emergency Contact: PRIODE,AMY Address: [phone] [facility] - secondary Healthcare POA          [place], [place] [address] [place] Mobile Phone: [phone] Relation: Friend  Code Status:  Goals of Care: Advanced Directive information    05/23/2024   10:26 AM  Advanced Directives  Does Patient Have a Medical Advance Directive? Yes  Type of Advance Directive Living will;Out of facility DNR (pink MOST or yellow form)  Does patient want to make changes to medical advance directive? No - Patient declined      HPI: Patient is a 89 y.o. female seen today for admission to [facility] assisted living.  She formerly lived at [facility] assisted living.  Past history is significant for hypertension, hyperlipidemia, sleep apnea, hypothyroidism, ischemic stroke to the left MCA, unstable gait, and depression. She is unsure [facility] as she is a poor historian due to cognitive impairment.  She is followed by neurology. I spoke with her daughter while I visited with the patient today.  She explained that is difficult for her to answer questions due to her  dementia.  Daughter's main concern is asthma and need for nebulizer treatments.  She had requested hand-held nebulizer but I explained that most of this medication would only end up in her throat and the nebulizer treatment would probably be more effective. Past Medical History:  Diagnosis Date   Allergy    Aortic stenosis    a. mild to mod by Echo 07/2012   Arthritis    Asymptomatic varicose veins    Benign paroxysmal vertigo    Bradycardia, unspecified    Bronchitis, not specified as acute or chronic    Cardiac murmur    Cataracts, bilateral    Chest pain, unspecified    Chronic cystitis    Depression    Diarrhea    Dyspnea    Dysuria    Frequency of micturition    Frequent urination    Gait disturbance 06/10/2014   Gastric ulcer    Hearing loss    Hemorrhoids    History of bone density study    History of colonoscopy    History of frequent urinary tract infections    History of urinary tract infection    Hx of cardiovascular stress test    a. ETT-Echo 3/12:  EF 60%, normal study   Hx of echocardiogram    a. Echo 2/14:  Mild LVH, EF 60-65%, Gr 1 diast dysfn, mild to mod AS, mean 17 mmHg, AVA 1.3 (VTI), trivial MR, mild LAE, PASP 31    Hyperlipemia    Hypertension    Hypothyroid    Left knee pain    Leg pain  Low back pain    Obstructive sleep apnea    OSA on CPAP    Other disorders of intestinal carbohydrate absorption    Other type of osteoarthritis, unspecified site    Overweight    Pain in thoracic spine    Palpitations    a. event monitor 3/14:  NSR, sinus brady   Plantar fasciitis    Plantar fasciitis    Right shoulder pain    S/P cholecystectomy    S/P TAH-BSO    Sleep apnea    Stroke (HCC)    tia 2016   TIA (transient ischemic attack)    Varicose veins of unspecified lower extremity with inflammation    Past Surgical History:  Procedure Laterality Date   ABDOMINAL HYSTERECTOMY     CHOLECYSTECTOMY     EP IMPLANTABLE DEVICE N/A 06/29/2016    Procedure: Loop Recorder Insertion;  Surgeon: Will Gladis Norton, MD;  Location: [facility];  Service: Cardiovascular;  Laterality: N/A;   FOOT SURGERY  1955   INCONTINENCE SURGERY     LOOP RECORDER REMOVAL N/A 08/10/2017   Procedure: LOOP RECORDER REMOVAL;  Surgeon: Norton Soyla Gladis, MD;  Location: [facility];  Service: Cardiovascular;  Laterality: N/A;   TONSILLECTOMY      reports that she has quit smoking. Her smoking use included cigarettes. She has never used smokeless tobacco. She reports current alcohol  use of about 1.0 standard drink of alcohol  per week. She reports that she does not use drugs. Social History   Socioeconomic History   Marital status: Widowed    Spouse name: Not on file   Number of children: 2   Years of education: college gr   Highest education level: Not on file  Occupational History   Occupation: retired runner, broadcasting/film/video  Tobacco Use   Smoking status: Former    Types: Cigarettes   Smokeless tobacco: Never  Vaping Use   Vaping status: Never Used  Substance and Sexual Activity   Alcohol  use: Yes    Alcohol /week: 1.0 standard drink of alcohol     Types: 1 Glasses of wine per week    Comment: wine sometimes   Drug use: No   Sexual activity: Not on file  Other Topics Concern   Not on file  Social History Narrative   Diet:      Caffeine:  2-3 cups of caffeine daily- decaf coffee.      Married, if yes what year: widow. Married 1955       Do you live in a house, apartment, assisted living, condo, trailer, ect: [facility] living.   Is it one or more stories: one      How many persons live in your home? 1      Pets:cno      Highest level or education completed: college degree      Current/Past profession: retired engineer, site      Exercise:  yes                Type and how often: daily         Living Will: Yes   DNR: Yes   POA/HPOA: Yes      Functional Status:   Do you have difficulty bathing or dressing  yourself? No   Do you have difficulty preparing food or eating? No   Do you have difficulty managing your medications? No   Do you have difficulty managing your finances? No   Do you have difficulty  affording your medications? No      Patient is right handed.    Social Drivers of Health   Tobacco Use: Medium Risk (05/23/2024)   Patient History    Smoking Tobacco Use: Former    Smokeless Tobacco Use: Never    Passive Exposure: Not on file  Financial Resource Strain: Low Risk (10/14/2023)   Overall Financial Resource Strain (CARDIA)    Difficulty of Paying Living Expenses: Not hard at all  Food Insecurity: No Food Insecurity (10/14/2023)   Hunger Vital Sign    Worried About Running Out of Food in the Last Year: Never true    Ran Out of Food in the Last Year: Never true  Transportation Needs: No Transportation Needs (10/14/2023)   PRAPARE - Administrator, Civil Service (Medical): No    Lack of Transportation (Non-Medical): No  Physical Activity: Insufficiently Active (10/14/2023)   Exercise Vital Sign    Days of Exercise per Week: 3 days    Minutes of Exercise per Session: 30 min  Stress: No Stress Concern Present (10/14/2023)   [employer] of Occupational Health - Occupational Stress Questionnaire    Feeling of Stress : Only a little  Social Connections: Socially Isolated (10/14/2023)   Social Connection and Isolation Panel    Frequency of Communication with Friends and Family: Twice a week    Frequency of Social Gatherings with Friends and Family: Once a week    Attends Religious Services: Never    Database Administrator or Organizations: No    Attends Banker Meetings: Never    Marital Status: Widowed  Intimate Partner Violence: Not At Risk (10/14/2023)   Humiliation, Afraid, Rape, and Kick questionnaire    Fear of Current or Ex-Partner: No    Emotionally Abused: No    Physically Abused: No    Sexually Abused: No  Depression (PHQ2-9): Low Risk  (10/14/2023)   Depression (PHQ2-9)    PHQ-2 Score: 0  Alcohol  Screen: Low Risk (10/14/2023)   Alcohol  Screen    Last Alcohol  Screening Score (AUDIT): 0  Housing: Low Risk (10/14/2023)   Housing Stability Vital Sign    Unable to Pay for Housing in the Last Year: No    Number of Times Moved in the Last Year: 0    Homeless in the Last Year: No  Utilities: Not At Risk (10/14/2023)   AHC Utilities    Threatened with loss of utilities: No  Health Literacy: Inadequate Health Literacy (10/14/2023)   B1300 Health Literacy    Frequency of need for help with medical instructions: Sometimes    Functional Status Survey:    Family History  Problem Relation Age of Onset   Heart attack Mother 66   Heart failure Mother    Arthritis Mother    Lung disease Father    Cancer - Prostate Father    Healthy Brother    Healthy Brother    Heart failure Maternal Uncle    Cancer Son     Health Maintenance  Topic Date Due   Zoster Vaccines- Shingrix (1 of 2) Never done   Pneumococcal Vaccine: 50+ Years (1 of 2 - PCV) 10/23/2024 (Originally 08/11/1948)   Medicare Annual Wellness (AWV)  10/13/2024   DTaP/Tdap/Td (3 - Td or Tdap) 06/14/2025   Influenza Vaccine  Completed   Bone Density Scan  Completed   Meningococcal B Vaccine  Aged Out   COVID-19 Vaccine  Discontinued    Allergies[1]  Outpatient Encounter Medications as  of 05/25/2024  Medication Sig   acetaminophen  (TYLENOL ) 500 MG tablet Take 1 tablet (500 mg total) by mouth every 8 (eight) hours as needed for moderate pain. (Patient taking differently: Take 500 mg by mouth every 8 (eight) hours as needed for moderate pain (pain score 4-6). Give 2 tablet by mouth every 8 hours as needed for pain)   albuterol (PROVENTIL) (2.5 MG/3ML) 0.083% nebulizer solution Take 2.5 mg by nebulization every 6 (six) hours as needed for wheezing or shortness of breath.   atorvastatin  (LIPITOR) 40 MG tablet Take 1 tablet (40 mg total) by mouth daily.    carboxymethylcellulose (REFRESH PLUS) 0.5 % SOLN Place 2 drops into both eyes daily as needed. (Patient taking differently: Place 2 drops into both eyes 3 (three) times daily. For dry eye)   clopidogrel  (PLAVIX ) 75 MG tablet Take 1 tablet (75 mg total) by mouth daily.   escitalopram  (LEXAPRO ) 10 MG tablet Take 15 mg by mouth at bedtime.   felodipine  (PLENDIL ) 5 MG 24 hr tablet Take 5 mg by mouth daily.   GUAIFENESIN PO Take 10 mLs by mouth every 6 (six) hours as needed.   hydrocortisone  (ANUSOL -HC) 2.5 % rectal cream Place 1 Application rectally 2 (two) times daily as needed for hemorrhoids or anal itching. (Patient not taking: Reported on 05/23/2024)   indapamide  (LOZOL ) 2.5 MG tablet Take 2.5 mg by mouth daily.   KETOCONAZOLE, TOPICAL, 1 % SHAM Apply 1 Application topically 2 (two) times a week. Tuesday & Friday   Lactobacillus-Inulin (CULTURELLE DIGESTIVE HEALTH PO) Take 500 mg by mouth as needed (for loose stool or antibiotic use twice daily as needed.).   levothyroxine  (SYNTHROID , LEVOTHROID) 50 MCG tablet Take 50 mcg by mouth in the morning.   melatonin 5 MG TABS Take 5 mg by mouth daily.   memantine  (NAMENDA ) 10 MG tablet Take 1 tablet (10 mg total) by mouth 2 (two) times daily. Start with 5mg  daily for 1 week and then increase to 5mg  BID   polyethylene glycol (MIRALAX / GLYCOLAX) 17 g packet Take 17 g by mouth daily.   traZODone  (DESYREL ) 50 MG tablet Take 50 mg by mouth daily.   No facility-administered encounter medications on file as of 05/25/2024.    Review of Systems  Constitutional: Negative.   Respiratory:  Positive for shortness of breath and wheezing.   Cardiovascular: Negative.   Gastrointestinal: Negative.   Genitourinary: Negative.   Musculoskeletal:  Positive for gait problem.  Psychiatric/Behavioral:  Positive for decreased concentration.   All other systems reviewed and are negative.   There were no vitals filed for this visit. There is no height or weight on file  to calculate BMI. Physical Exam Vitals and nursing note reviewed.  Constitutional:      Appearance: Normal appearance.  HENT:     Head: Normocephalic.     Mouth/Throat:     Mouth: Mucous membranes are moist.     Pharynx: Oropharynx is clear.  Cardiovascular:     Rate and Rhythm: Normal rate and regular rhythm.     Heart sounds: Murmur heard.  Pulmonary:     Effort: Pulmonary effort is normal.     Breath sounds: Normal breath sounds.  Abdominal:     Palpations: Abdomen is soft.  Musculoskeletal:     Comments: Uses walker to help with ambulation  Skin:    General: Skin is warm.  Neurological:     General: No focal deficit present.     Mental Status: She is  alert.     Comments: Oriented to person but not place or time  Psychiatric:        Mood and Affect: Mood normal.     Labs reviewed: Basic Metabolic Panel: Recent Labs    07/04/23 0000 10/30/23 1500 10/30/23 1502  NA 143 139 140  K 5.0 4.0 4.0  CL 104 102 100  CO2 30* 27  --   GLUCOSE  --  143* 141*  BUN 27* 25* 29*  CREATININE 1.0 1.25* 1.20*  CALCIUM  8.6* 8.6*  --    Liver Function Tests: Recent Labs    07/04/23 0000 10/30/23 1500  AST 19 22  ALT 12 18  ALKPHOS 53 54  BILITOT  --  0.7  PROT  --  6.1*  ALBUMIN 3.7 3.4*   No results for input(s): LIPASE, AMYLASE in the last 8760 hours. No results for input(s): AMMONIA in the last 8760 hours. CBC: Recent Labs    07/04/23 0000 10/30/23 1500 10/30/23 1502  WBC 6.1 7.2  --   NEUTROABS  --  4.1  --   HGB 13.2 13.9 14.3  HCT 40 42.0 42.0  MCV  --  99.5  --   PLT 201 209  --    Cardiac Enzymes: No results for input(s): CKTOTAL, CKMB, CKMBINDEX, TROPONINI in the last 8760 hours. BNP: Invalid input(s): POCBNP Lab Results  Component Value Date   HGBA1C 6.7 11/23/2022   Lab Results  Component Value Date   TSH 0.80 11/23/2022   Lab Results  Component Value Date   VITAMINB12 299 06/10/2014   No results found for: FOLATE No  results found for: IRON, TIBC, FERRITIN  Imaging and Procedures obtained prior to SNF admission: [name] CONTRAST Result Date: 10/30/2023 CLINICAL DATA:  Provided history: Neuro deficit, acute, stroke suspected. EXAM: MRI HEAD WITHOUT CONTRAST TECHNIQUE: Multiplanar, multiecho pulse sequences of the brain and surrounding structures were obtained without intravenous contrast. COMPARISON:  Non-contrast head CT and CT angiogram head/neck performed earlier today 10/30/2023. Brain MRI 04/16/2022. FINDINGS: Brain: Generalized cerebral atrophy. Small chronic left MCA territory infarcts within the left insula, temporal lobe and periatrial white matter, some of which were better appreciated on the prior brain MRI of 04/16/2022 (acute at that time). Chronic lacunar infarct within the left subinsular white matter, new from the prior MRI (series 5, image 15). Background moderate multifocal T2 FLAIR hyperintense signal abnormality within the cerebral white matter, nonspecific but compatible with chronic small vessel ischemic disease. There is no acute infarct. No evidence of an intracranial mass. No chronic intracranial blood products. No extra-axial fluid collection. No midline shift. Vascular: See CTA head/neck performed earlier today. Skull and upper cervical spine: No focal worrisome marrow lesion. Sinuses/Orbits: No mass or acute finding within the imaged orbits. Prior bilateral ocular lens replacement. Redemonstrated chronic, depressed fracture deformity of the right orbital floor. Minimal mucosal thickening within the bilateral ethmoid sinuses. Other: Small-volume fluid within right mastoid air cells. IMPRESSION: 1. No acute intracranial finding. 2. Known small chronic left MCA territory infarcts within the left insula, temporal lobe and periatrial white matter. 3. Chronic lacunar infarct within the left subinsular white matter, new from the prior MRI of 04/16/2022. 4. Background moderate cerebral white  matter chronic small vessel ischemic disease. 5. Generalized cerebral atrophy. Electronically Signed   By: Rockey Childs D.O.   On: 10/30/2023 18:42   CT ANGIO HEAD NECK W WO CM (CODE STROKE) Addendum Date: 10/30/2023 ADDENDUM REPORT: 10/30/2023 15:56 ADDENDUM: CTA head  impression #1 called by telephone at the time of interpretation on 10/30/2023 at 3:45 pm to provider [facility] , who verbally acknowledged these results. Electronically Signed   By: Rockey Childs D.O.   On: 10/30/2023 15:56   Result Date: 10/30/2023 CLINICAL DATA:  Provided history: Neuro deficit, acute, stroke suspected. Dizziness. Aphasia. EXAM: CT ANGIOGRAPHY HEAD AND NECK WITH AND WITHOUT CONTRAST TECHNIQUE: Multidetector CT imaging of the head and neck was performed using the standard protocol during bolus administration of intravenous contrast. Multiplanar CT image reconstructions and MIPs were obtained to evaluate the vascular anatomy. Carotid stenosis measurements (when applicable) are obtained utilizing NASCET criteria, using the distal internal carotid diameter as the denominator. RADIATION DOSE REDUCTION: This exam was performed according to the departmental dose-optimization program which includes automated exposure control, adjustment of the mA and/or kV according to patient size and/or use of iterative reconstruction technique. CONTRAST:  75mL ISOVUE -370 IOPAMIDOL  (ISOVUE -370) INJECTION 76% COMPARISON:  Noncontrast head CT performed earlier today 10/30/2023. CT angiogram head/neck 04/17/2022. FINDINGS: CTA NECK FINDINGS Aortic arch: Standard aortic branching. Atherosclerotic plaque within the visualized thoracic aorta and proximal major branch vessels of the neck. No hemodynamically significant innominate or proximal subclavian artery stenosis. Right carotid system: CCA and ICA patent within the neck without hemodynamically significant stenosis (50% or greater). Mild-to-moderate atherosclerotic plaque about the carotid bifurcation  and within the proximal ICA. Left carotid system: CCA and ICA patent within the neck without hemodynamically significant stenosis (50% or greater). Mild atherosclerotic plaque at the CCA origin and about the carotid bifurcation. Vertebral arteries: Venous reflux of contrast partially obscures left vertebral artery proximal V1 segment. Within this limitation, the vertebral arteries are codominant and patent within the neck without stenosis or significant atherosclerotic disease. Skeleton: Cervical spondylosis. No acute fracture or aggressive osseous lesion. Developmental nonunion of the C1 posterior arch on the right. Other neck: No neck mass or cervical lymphadenopathy. Upper chest: No consolidation within the imaged lung apices. Review of the MIP images confirms the above findings CTA HEAD FINDINGS Anterior circulation: The intracranial internal carotid arteries are patent. Nonstenotic atherosclerotic plaque within both vessels. The M1 middle cerebral arteries are patent. No M2 proximal branch occlusion is identified. Moderate/severe stenosis within a mid M2 left MCA vessel (at site of prior occlusion) (series 12, image 19). The anterior cerebral arteries are patent. Mildly hypoplastic left A1 segment. No intracranial aneurysm is identified. Posterior circulation: The intracranial vertebral arteries are patent. The basilar artery is patent. Occluded right posterior cerebral artery branch at the P2/P3 segment junction, new from the prior CTA of 04/17/2022 (for instance as seen on series 12, image 29). The left posterior cerebral artery is patent. A left posterior communicating artery is present. The right posterior communicating artery is diminutive or absent. Venous sinuses: Within the limitations of contrast timing, no convincing thrombus. Anatomic variants: As described. Review of the MIP images confirms the above findings Attempts are being made to reach the ordering provider at this time. IMPRESSION: CTA neck:  1. The common carotid and internal carotid arteries are patent within the neck without hemodynamically significant stenosis (50% or greater). Atherosclerotic plaque bilaterally, as described. 2. Venous reflux of contrast partially obscures the left vertebral artery proximal V1 segment. Within this limitation, the vertebral arteries are patent within the neck without stenosis or significant atherosclerotic disease. 3. Aortic Atherosclerosis (ICD10-I70.0). CTA head: 1. Occluded right posterior cerebral artery branch at the P2/P3 junction, new from the prior CTA of 04/17/2022. 2. Moderate/severe stenosis within a mid  M2 left middle cerebral artery branch vessel (at site of prior occlusion). 3. Non-stenotic atherosclerotic plaque within the intracranial internal carotid arteries. Electronically Signed: By: Rockey Childs D.O. On: 10/30/2023 15:44   CT HEAD CODE STROKE WO CONTRAST Result Date: 10/30/2023 CLINICAL DATA:  Code stroke. Provided history: Neuro deficit, acute, stroke suspected. EXAM: CT HEAD WITHOUT CONTRAST TECHNIQUE: Contiguous axial images were obtained from the base of the skull through the vertex without intravenous contrast. RADIATION DOSE REDUCTION: This exam was performed according to the departmental dose-optimization program which includes automated exposure control, adjustment of the mA and/or kV according to patient size and/or use of iterative reconstruction technique. COMPARISON:  Non-contrast head CT and CT angiogram head/neck 04/17/2022. FINDINGS: Brain: Generalized cerebral atrophy. Small chronic infarcts within the left periatrial white matter. Additional known small chronic left MCA territory infarcts are not well appreciated by CT. Patchy and ill-defined hypoattenuation within the cerebral white matter, nonspecific but compatible with moderate chronic small vessel ischemic disease. There is no acute intracranial hemorrhage. No acute demarcated cortical infarct. No extra-axial fluid  collection. No evidence of an intracranial mass. No midline shift. Vascular: No hyperdense vessel.  Atherosclerotic calcifications. Skull: No calvarial fracture or aggressive osseous lesion. Sinuses/Orbits: No mass or acute finding within the imaged orbits. Redemonstrated chronic, depressed fracture deformity of the right orbital floor. No significant inflammatory paranasal sinus disease. ASPECTS [facility] Stroke Program Early CT Score) - Ganglionic level infarction (caudate, lentiform nuclei, internal capsule, insula, M1-M3 cortex): 7 - Supraganglionic infarction (M4-M6 cortex): 3 Total score (0-10 with 10 being normal): 10 (when discounting chronic infarcts). No acute intracranial finding. These results were communicated to Dr. Voncile at 3:13 pmon 5/18/2025by text page via the [employer] messaging system. IMPRESSION: 1.  No acute intracranial finding. 2. Parenchymal atrophy, chronic small vessel ischemic disease and chronic infarcts, as described. Electronically Signed   By: Rockey Childs D.O.   On: 10/30/2023 15:13    Assessment/Plan Essential hypertension Blood pressure control on mild diuretic, indapamide  2.5 mg  OSA (obstructive sleep apnea) Patient has CPAP  Sensorineural hearing loss (SNHL), bilateral She has hearing aids but were not in use today  Sleep related hypoxia Does not use oxygen at bedtime but might be indicated in the future.  Daughter concern today primarily about wheezing and asthma and use of rescue inhalers.  Daughter request handheld albuterol but I think nebulizer would probably be more effective.  Will plan to use that on a scheduled basis    Family/ staff Communication:   Labs/tests ordered:  Garnette HERO. [place], MD [facility] [address] [place], [place] [address] Office [phone]      [1]  Allergies Allergen Reactions   Bee Venom Other (See Comments)    Unknown reaction Per facility    Cipro [Ciprofloxacin Hcl] Other (See Comments)    Unknown  reaction  Per facility   Crestor [Rosuvastatin] Other (See Comments)    Myalgias    Lodine [Etodolac] Other (See Comments)    Unknown reaction Per facility   Naprosyn [Naproxen] Other (See Comments)    Unknown reaction Per facility   Sulfonamide Derivatives Other (See Comments)    Unknown reaction Per facility   Tositumomab    Epipen  [Epinephrine ] Palpitations   Macrobid [Nitrofurantoin Macrocrystal] Rash   Metrocream [Metronidazole] Rash   Xanax [Alprazolam] Rash

## 2024-05-25 NOTE — Assessment & Plan Note (Signed)
 Does not use oxygen at bedtime but might be indicated in the future.  Daughter concern today primarily about wheezing and asthma and use of rescue inhalers.  Daughter request handheld albuterol but I think nebulizer would probably be more effective.  Will plan to use that on a scheduled basis

## 2024-05-25 NOTE — Assessment & Plan Note (Signed)
 She has hearing aids but were not in use today

## 2024-05-25 NOTE — Assessment & Plan Note (Signed)
 Blood pressure control on mild diuretic, indapamide  2.5 mg

## 2024-05-25 NOTE — Assessment & Plan Note (Signed)
Patient has CPAP

## 2024-06-19 ENCOUNTER — Non-Acute Institutional Stay: Payer: Self-pay | Admitting: Nurse Practitioner

## 2024-06-19 ENCOUNTER — Encounter: Payer: Self-pay | Admitting: Nurse Practitioner

## 2024-06-19 DIAGNOSIS — G3184 Mild cognitive impairment, so stated: Secondary | ICD-10-CM

## 2024-06-19 DIAGNOSIS — G4733 Obstructive sleep apnea (adult) (pediatric): Secondary | ICD-10-CM | POA: Diagnosis not present

## 2024-06-19 DIAGNOSIS — F32A Depression, unspecified: Secondary | ICD-10-CM

## 2024-06-19 DIAGNOSIS — E785 Hyperlipidemia, unspecified: Secondary | ICD-10-CM

## 2024-06-19 DIAGNOSIS — G4734 Idiopathic sleep related nonobstructive alveolar hypoventilation: Secondary | ICD-10-CM | POA: Diagnosis not present

## 2024-06-19 DIAGNOSIS — I10 Essential (primary) hypertension: Secondary | ICD-10-CM | POA: Diagnosis not present

## 2024-06-19 DIAGNOSIS — E039 Hypothyroidism, unspecified: Secondary | ICD-10-CM

## 2024-06-19 NOTE — Assessment & Plan Note (Signed)
 Reported the patient has Sat O2 down to 70-80s, resolves once deep breathing encouraged, no O2 desaturation on exertion or paroxysmal nocturnal orthopnea. Denied cough, SOB, chest pain/discomfort, palpation. Noted mild edema BLE Noted carotid bruit R+L SM  O2 2lpm via [place] to maintain Sat O2>90% Incentive spirometer tid Weight weekly Carotid artery doppler echocardiogram

## 2024-06-19 NOTE — Progress Notes (Signed)
 " Location:   [facility] Room Number: 918 Place of Service:  ALF (13) Provider: Larwance Madalina Rosman NP  Charlanne Fredia CROME, MD  Patient Care Team: Charlanne Fredia CROME, MD as PCP - General (Internal Medicine) Inocencio Soyla Lunger, MD as PCP - Cardiology (Cardiology) Charmayne Molly, MD as Consulting Physician (Ophthalmology) Delana Ted Morrison, DO as Consulting Physician (Obstetrics and Gynecology) Dohmeier, Dedra, MD as Consulting Physician (Neurology) Jesus Oliphant, MD as Consulting Physician (Otolaryngology)  Extended Emergency Contact Information Primary Emergency Contact: SHANA GWYNNETH GULL Address: [address] - healthcare and general Power of [place], docs on file          [place], [place] [address] [place] Phone: [phone] Relation: Daughter Secondary Emergency Contact: PRIODE,AMY Address: [phone] [facility] - secondary Healthcare [address]          [place], [place] [address] [place] Mobile Phone: [phone] Relation: Friend  Code Status:  DNR Goals of care: Advanced Directive information    05/23/2024   10:26 AM  Advanced Directives  Does Patient Have a Medical Advance Directive? Yes  Type of Advance Directive Living will;Out of facility DNR (pink MOST or yellow form)  Does patient want to make changes to medical advance directive? No - Patient declined     Chief Complaint  Patient presents with   Medical Management of Chronic Issues    HPI:  Pt is a 89 y.o. female seen today for medical management of chronic diseases.    Reported the patient has Sat O2 down to 70-80s, resolves once deep breathing encouraged, no O2 desaturation on exertion or paroxysmal nocturnal orthopnea. Denied cough, SOB, chest pain/discomfort, palpation. Noted mild edema BLE  Hx of sleep related hypoxia, prn Albuterol. CXR 05/23/24 showed no cute pulmonary disease.   OSA  Insomnia/depression, takes Trazodone , Lexapro   Major neurocognitive disorder, takes  Memantine   Hypothyroidism, takes Levothyroxine  , TSH 1.52 05/23/24  HTN, taking Indapamide , Plendil , Bun/creat 140/4.3 05/23/24  Hx of CVA 04/2022, on Plavix , Atorvastatin   Moderate AS  Hemorrhoids, Hgb 14.6 05/23/24  HLD, taking Atorvastatin , LDL 65 05/23/24       Past Medical History:  Diagnosis Date   Allergy    Aortic stenosis    a. mild to mod by Echo 07/2012   Arthritis    Asymptomatic varicose veins    Benign paroxysmal vertigo    Bradycardia, unspecified    Bronchitis, not specified as acute or chronic    Cardiac murmur    Cataracts, bilateral    Chest pain, unspecified    Chronic cystitis    Depression    Diarrhea    Dyspnea    Dysuria    Frequency of micturition    Frequent urination    Gait disturbance 06/10/2014   Gastric ulcer    Hearing loss    Hemorrhoids    History of bone density study    History of colonoscopy    History of frequent urinary tract infections    History of urinary tract infection    Hx of cardiovascular stress test    a. ETT-Echo 3/12:  EF 60%, normal study   Hx of echocardiogram    a. Echo 2/14:  Mild LVH, EF 60-65%, Gr 1 diast dysfn, mild to mod AS, mean 17 mmHg, AVA 1.3 (VTI), trivial MR, mild LAE, PASP 31    Hyperlipemia    Hypertension    Hypothyroid    Left knee pain    Leg pain  Low back pain    Obstructive sleep apnea    OSA on CPAP    Other disorders of intestinal carbohydrate absorption    Other type of osteoarthritis, unspecified site    Overweight    Pain in thoracic spine    Palpitations    a. event monitor 3/14:  NSR, sinus brady   Plantar fasciitis    Plantar fasciitis    Right shoulder pain    S/P cholecystectomy    S/P TAH-BSO    Sleep apnea    Stroke (HCC)    tia 2016   TIA (transient ischemic attack)    Varicose veins of unspecified lower extremity with inflammation    Past Surgical History:  Procedure Laterality Date   ABDOMINAL HYSTERECTOMY     CHOLECYSTECTOMY     EP IMPLANTABLE DEVICE N/A  06/29/2016   Procedure: Loop Recorder Insertion;  Surgeon: Will Gladis Norton, MD;  Location: [facility];  Service: Cardiovascular;  Laterality: N/A;   FOOT SURGERY  1955   INCONTINENCE SURGERY     LOOP RECORDER REMOVAL N/A 08/10/2017   Procedure: LOOP RECORDER REMOVAL;  Surgeon: Norton Soyla Gladis, MD;  Location: [facility];  Service: Cardiovascular;  Laterality: N/A;   TONSILLECTOMY      Allergies[1]  Allergies as of 06/19/2024       Reactions   Bee Venom Other (See Comments)   Unknown reaction Per facility    Cipro [ciprofloxacin Hcl] Other (See Comments)   Unknown reaction  Per facility   Crestor [rosuvastatin] Other (See Comments)   Myalgias    Lodine [etodolac] Other (See Comments)   Unknown reaction Per facility   Naprosyn [naproxen] Other (See Comments)   Unknown reaction Per facility   Sulfonamide Derivatives Other (See Comments)   Unknown reaction Per facility   Tositumomab    Epipen  [epinephrine ] Palpitations   Macrobid [nitrofurantoin Macrocrystal] Rash   Metrocream [metronidazole] Rash   Xanax [alprazolam] Rash        Medication List        Accurate as of June 19, 2024 11:59 PM. If you have any questions, ask your nurse or doctor.          acetaminophen  500 MG tablet Commonly known as: TYLENOL  Take 1 tablet (500 mg total) by mouth every 8 (eight) hours as needed for moderate pain. What changed: additional instructions   albuterol (2.5 MG/3ML) 0.083% nebulizer solution Commonly known as: PROVENTIL Take 2.5 mg by nebulization every 6 (six) hours as needed for wheezing or shortness of breath.   atorvastatin  40 MG tablet Commonly known as: LIPITOR Take 1 tablet (40 mg total) by mouth daily.   carboxymethylcellulose 0.5 % Soln Commonly known as: REFRESH PLUS Place 2 drops into both eyes daily as needed. What changed:  when to take this additional instructions   clopidogrel  75 MG tablet Commonly known as: PLAVIX  Take 1  tablet (75 mg total) by mouth daily.   CULTURELLE DIGESTIVE HEALTH PO Take 500 mg by mouth as needed (for loose stool or antibiotic use twice daily as needed.).   escitalopram  10 MG tablet Commonly known as: LEXAPRO  Take 15 mg by mouth at bedtime.   felodipine  5 MG 24 hr tablet Commonly known as: PLENDIL  Take 5 mg by mouth daily.   GUAIFENESIN PO Take 10 mLs by mouth every 6 (six) hours as needed.   hydrocortisone  2.5 % rectal cream Commonly known as: ANUSOL -HC Place 1 Application rectally 2 (two) times daily as needed  for hemorrhoids or anal itching.   indapamide  2.5 MG tablet Commonly known as: LOZOL  Take 2.5 mg by mouth daily.   KETOCONAZOLE (TOPICAL) 1 % Sham Apply 1 Application topically 2 (two) times a week. Tuesday & Friday   levothyroxine  50 MCG tablet Commonly known as: SYNTHROID  Take 50 mcg by mouth in the morning.   melatonin 5 MG Tabs Take 5 mg by mouth daily.   memantine  10 MG tablet Commonly known as: Namenda  Take 1 tablet (10 mg total) by mouth 2 (two) times daily. Start with 5mg  daily for 1 week and then increase to 5mg  BID   polyethylene glycol 17 g packet Commonly known as: MIRALAX / GLYCOLAX Take 17 g by mouth daily.   traZODone  50 MG tablet Commonly known as: DESYREL  Take 50 mg by mouth daily.        Review of Systems  Constitutional:  Negative for appetite change and fatigue.  HENT:  Negative for congestion and trouble swallowing.   Respiratory:  Positive for shortness of breath. Negative for apnea, cough, choking, chest tightness and wheezing.   Cardiovascular:  Positive for leg swelling. Negative for chest pain and palpitations.  Gastrointestinal:  Negative for abdominal pain and constipation.  Genitourinary:  Negative for dysuria and urgency.  Musculoskeletal:  Positive for arthralgias and gait problem.  Skin:  Negative for color change.  Neurological:  Negative for weakness and headaches.  Psychiatric/Behavioral:  Negative for  confusion and sleep disturbance. The patient is not nervous/anxious.     Immunization History  Administered Date(s) Administered    sv, Bivalent, Protein Subunit Rsvpref,pf (Abrysvo) 06/26/2022   DTaP 09/30/1995   INFLUENZA, HIGH DOSE SEASONAL PF 04/12/2023   Influenza-Unspecified 06/15/1999, 04/12/2023, 04/12/2023, 03/20/2024   Moderna Covid-19 Vaccine Bivalent Booster 58yrs & up 04/20/2023   Moderna SARS-COV2 Booster Vaccination 03/25/2020, 11/25/2021   Moderna Sars-Covid-2 Vaccination 07/02/2019, 07/16/2019, 11/19/2020   Respiratory Syncytial Virus Vaccine,Recomb Aduvanted(Arexvy) 06/26/2022   Tdap 06/15/2015   Unspecified SARS-COV-2 Vaccination 04/20/2023, 04/10/2024   Pertinent  Health Maintenance Due  Topic Date Due   Influenza Vaccine  Completed   Bone Density Scan  Completed      06/25/2022    1:53 PM 08/25/2022   12:58 PM 06/27/2023   12:22 PM 10/14/2023   10:55 AM 03/14/2024    2:52 PM  Fall Risk  Falls in the past year? 0 1 1 1 1   Was there an injury with Fall? 0  1  1  1   0   Fall Risk Category Calculator 0 2 2 2 1   Fall Risk Category (Retired) Low       (RETIRED) Patient Fall Risk Level High fall risk       Patient at Risk for Falls Due to History of fall(s);Impaired balance/gait;Impaired mobility History of fall(s);Impaired balance/gait;Impaired mobility History of fall(s);Impaired balance/gait History of fall(s);Impaired balance/gait History of fall(s);Impaired balance/gait  Fall risk Follow up Falls evaluation completed;Education provided  Falls evaluation completed;Education provided Falls evaluation completed;Education provided Falls evaluation completed;Education provided Falls evaluation completed     Data saved with a previous flowsheet row definition   Functional Status Survey:    Vitals:   06/19/24 1648 06/21/24 1055  BP: (!) 140/64 (!) 140/64  Pulse: (!) 17   Resp: 17   Temp: (!) 97.5 F (36.4 C)   SpO2: 93%   Weight: 183 lb 3.2 oz (83.1 kg)     Body mass index is 27.86 kg/m. Physical Exam Vitals and nursing note reviewed.  Constitutional:  Appearance: Normal appearance.  HENT:     Head: Normocephalic and atraumatic.     Nose: Nose normal.     Mouth/Throat:     Mouth: Mucous membranes are moist.  Eyes:     Extraocular Movements: Extraocular movements intact.     Conjunctiva/sclera: Conjunctivae normal.     Pupils: Pupils are equal, round, and reactive to light.  Cardiovascular:     Rate and Rhythm: Normal rate and regular rhythm.     Heart sounds: Murmur heard.  Pulmonary:     Effort: Pulmonary effort is normal.     Breath sounds: No wheezing, rhonchi or rales.  Abdominal:     General: Bowel sounds are normal.     Palpations: Abdomen is soft.     Tenderness: There is no abdominal tenderness.  Musculoskeletal:        General: No tenderness. Normal range of motion.     Cervical back: Normal range of motion and neck supple.     Right lower leg: Edema present.     Left lower leg: Edema present.     Comments: Minimal edema BLE  Skin:    General: Skin is warm and dry.     Findings: No rash.  Neurological:     General: No focal deficit present.     Mental Status: She is alert and oriented to person, place, and time. Mental status is at baseline.     Motor: No weakness.     Coordination: Coordination normal.     Gait: Gait abnormal.     Comments: Ambulates with walker.   Psychiatric:        Mood and Affect: Mood normal.        Behavior: Behavior normal.        Thought Content: Thought content normal.     Labs reviewed: Recent Labs    07/04/23 0000 10/30/23 1500 10/30/23 1502  NA 143 139 140  K 5.0 4.0 4.0  CL 104 102 100  CO2 30* 27  --   GLUCOSE  --  143* 141*  BUN 27* 25* 29*  CREATININE 1.0 1.25* 1.20*  CALCIUM  8.6* 8.6*  --    Recent Labs    07/04/23 0000 10/30/23 1500  AST 19 22  ALT 12 18  ALKPHOS 53 54  BILITOT  --  0.7  PROT  --  6.1*  ALBUMIN 3.7 3.4*   Recent Labs     07/04/23 0000 10/30/23 1500 10/30/23 1502  WBC 6.1 7.2  --   NEUTROABS  --  4.1  --   HGB 13.2 13.9 14.3  HCT 40 42.0 42.0  MCV  --  99.5  --   PLT 201 209  --    Lab Results  Component Value Date   TSH 0.80 11/23/2022   Lab Results  Component Value Date   HGBA1C 6.7 11/23/2022   Lab Results  Component Value Date   CHOL 105 11/20/2022   HDL 39 11/20/2022   LDLCALC 47 11/20/2022   LDLDIRECT 135.1 08/26/2010   TRIG 100 11/20/2022   CHOLHDL 3.9 04/17/2022    Significant Diagnostic Results in last 30 days:  No results found.  Assessment/Plan  Sleep related hypoxia Reported the patient has Sat O2 down to 70-80s, resolves once deep breathing encouraged, no O2 desaturation on exertion or paroxysmal nocturnal orthopnea. Denied cough, SOB, chest pain/discomfort, palpation. Noted mild edema BLE Noted carotid bruit R+L SM  O2 2lpm via  to maintain Sat O2>90% Incentive spirometer  tid Weight weekly Carotid artery doppler echocardiogram  OSA (obstructive sleep apnea) Hx of sleep related hypoxia, prn Albuterol. CXR 05/23/24 showed no cute pulmonary disease.   OSA  Depression The patient state she sleeps well at night, her daughter observed the patient has periodic relentlessness in her sleep Long standing Trazodone , Lexapro  use.   MCI (mild cognitive impairment) with memory loss No behavioral issues, underwent neurology eval.  takes Memantine   Hypothyroidism  takes Levothyroxine  , TSH 1.52 05/23/24  Essential hypertension Permissive blood pressure control taking Indapamide , Plendil , Bun/creat 140/4.3 05/23/24  Hyperlipidemia Hx of CVA 04/2022, on Plavix , Atorvastatin   Moderate AS  HLD, taking Atorvastatin , LDL 65 05/23/24   Family/ staff Communication: plan of care reviewed with the patient, the patient's HPOA daughter, and charge nurse.   Labs/tests ordered:  carotid artery doppler R+L, echocardiogram       [1]  Allergies Allergen Reactions   Bee  Venom Other (See Comments)    Unknown reaction Per facility    Cipro [Ciprofloxacin Hcl] Other (See Comments)    Unknown reaction  Per facility   Crestor [Rosuvastatin] Other (See Comments)    Myalgias    Lodine [Etodolac] Other (See Comments)    Unknown reaction Per facility   Naprosyn [Naproxen] Other (See Comments)    Unknown reaction Per facility   Sulfonamide Derivatives Other (See Comments)    Unknown reaction Per facility   Tositumomab    Epipen  [Epinephrine ] Palpitations   Macrobid [Nitrofurantoin Macrocrystal] Rash   Metrocream [Metronidazole] Rash   Xanax [Alprazolam] Rash   "

## 2024-06-21 NOTE — Assessment & Plan Note (Signed)
 The patient state she sleeps well at night, her daughter observed the patient has periodic relentlessness in her sleep Long standing Trazodone , Lexapro  use.

## 2024-06-21 NOTE — Assessment & Plan Note (Signed)
 No behavioral issues, underwent neurology eval.  takes Memantine 

## 2024-06-21 NOTE — Assessment & Plan Note (Signed)
 Permissive blood pressure control taking Indapamide , Plendil , Bun/creat 140/4.3 05/23/24

## 2024-06-21 NOTE — Assessment & Plan Note (Signed)
"   takes Levothyroxine  , TSH 1.52 05/23/24 "

## 2024-06-21 NOTE — Assessment & Plan Note (Signed)
 Hx of CVA 04/2022, on Plavix , Atorvastatin   Moderate AS  HLD, taking Atorvastatin , LDL 65 05/23/24

## 2024-06-21 NOTE — Assessment & Plan Note (Signed)
 Hx of sleep related hypoxia, prn Albuterol. CXR 05/23/24 showed no cute pulmonary disease.   OSA

## 2024-07-06 ENCOUNTER — Encounter: Payer: Self-pay | Admitting: Cardiology

## 2024-07-06 ENCOUNTER — Ambulatory Visit: Attending: Cardiology | Admitting: Cardiology

## 2024-07-06 VITALS — BP 127/55 | HR 49 | Resp 17 | Ht 68.0 in | Wt 185.0 lb

## 2024-07-06 DIAGNOSIS — R001 Bradycardia, unspecified: Secondary | ICD-10-CM

## 2024-07-06 DIAGNOSIS — G459 Transient cerebral ischemic attack, unspecified: Secondary | ICD-10-CM

## 2024-07-06 DIAGNOSIS — I350 Nonrheumatic aortic (valve) stenosis: Secondary | ICD-10-CM

## 2024-07-06 DIAGNOSIS — Z7189 Other specified counseling: Secondary | ICD-10-CM

## 2024-07-06 MED ORDER — FUROSEMIDE 40 MG PO TABS: 40.0000 mg | ORAL_TABLET | Freq: Every day | ORAL | Status: AC

## 2024-07-06 NOTE — Progress Notes (Signed)
 " Cardiology Office Note:  .   Date:  07/06/2024  ID:  Autumn Allen Deems, DOB [birth year], MRN [id number] PCP: Charlanne Fredia CROME, MD  [facility] Providers Cardiologist:  Newman Lawrence, MD PCP: Charlanne Fredia CROME, MD  Chief Complaint  Patient presents with   Bradycardia   Aortic Stenosis     AKASIA Autumn Allen is a 89 y.o. female with hypertension, hyperlipidemia, aortic stenosis, OSA on CPAP,  h/o TIA, dementia  Discussed the use of AI scribe software for clinical note transcription with the patient, who gave verbal consent to proceed.  History of Present Illness Autumn Allen is a 89 year old female with aortic stenosis and history of ischemic strokes who presents with shortness of breath and lightheadedness. She is accompanied by her daughter, Gwynneth Avers, who is also her healthcare power of attorney, as well as patient's friend Amy Priode.  Patient was last evaluated by Dr. Inocencio in 2021, and was seen by Dr. Barnetta during hospitalization in 04/2022.  Patient has not established with general cardiology outpatient.  She has frequent shortness of breath with exertion, which requires her to stop and rest. She uses albuterol for wheezing. She has no chest pain. She has lightheadedness and dizziness on standing about half the time, without syncope.  She has known aortic stenosis as well as question of possible LVOT gradient on echocardiogram in 2023.  Patient recently underwent echocardiogram at her assisted living facility friend's home.  Report not available to me.  However, based on daughter's description, EF is reportedly 40% with severe aortic stenosis noted.  She had ischemic strokes in November 2023.  Since then, there has been gradual decline in her physical capacity.  Her current medications are Plavix , atorvastatin , felodipine , indapamide , thyroid  replacement, and albuterol. She has been on Plavix  since her stroke in 2023.  She lives in an assisted living facility  and needs about 75 percent assistance with activities of daily living. She uses a walker. She drinks limited water despite higher intake of other fluids.  She does have significant memory loss, especially short-term memory.      Vitals:   07/06/24 1419  BP: (!) 127/55  Pulse: (!) 49  Resp: 17  SpO2: 93%      Review of Systems  Cardiovascular:  Positive for dyspnea on exertion and leg swelling. Negative for chest pain, palpitations and syncope.  Neurological:  Positive for light-headedness.        Studies Reviewed: SABRA        EKG 07/06/2024: Unusual P axis, possible ectopic atrial bradycardia When compared with ECG of 30-Oct-2023 15:31, P axis is different, rate is similar, PAC absent   Echocardiogram 2023: 1. Left ventricular ejection fraction, by estimation, is 70 to 75%. The  left ventricle has hyperdynamic function. The left ventricle has no  regional wall motion abnormalities. There is mild left ventricular  hypertrophy. Left ventricular diastolic parameters are consistent  with Grade I diastolic dysfunction (impaired relaxation).   2. Right ventricular systolic function is normal. The right ventricular  size is normal. Tricuspid regurgitation signal is inadequate for assessing  PA pressure.   3. The mitral valve is degenerative. Trivial mitral valve regurgitation.  No evidence of mitral stenosis.   4. The aortic valve is abnormal. There is severe calcifcation of the  aortic valve. Aortic valve regurgitation is trivial. Moderate aortic valve  stenosis. Aortic valve mean gradient measures 30.0 mmHg. Aortic valve Vmax  measures 3.61 m/s. Hyperdynamic function and elevated LVOT  TVI impact calculation of valve area and dimensionless index.   5. The inferior vena cava is normal in size with greater than 50%  respiratory variability, suggesting right atrial pressure of 3 mmHg.   Monitor 2023: Sinus rhythm with sinus bradycardia No atrial fibrillation <1% ventricular  ectopy   Labs 10/2023: Hb 14.3 Cr 1.2, eGFR 40  11/2022: Chol 105, TG 100, HDL 39, LDL 47 HbA1C 6.7%    Physical Exam Vitals and nursing note reviewed.  Constitutional:      General: She is not in acute distress. Neck:     Vascular: No JVD.  Cardiovascular:     Rate and Rhythm: Normal rate and regular rhythm.     Heart sounds: Murmur heard.     Harsh midsystolic murmur is present with a grade of 3/6 at the upper right sternal border radiating to the neck.  Pulmonary:     Effort: Pulmonary effort is normal.     Breath sounds: Normal breath sounds. No wheezing or rales.  Musculoskeletal:     Right lower leg: Edema (1+) present.     Left lower leg: Edema (1+) present.      VISIT DIAGNOSES:   ICD-10-CM   1. Nonrheumatic aortic valve stenosis  I35.0     2. Bradycardia  R00.1     3. TIA (transient ischemic attack)  G45.9 EKG 12-Lead    4. Advanced care planning/counseling discussion  Z71.89        Autumn Allen is a 89 y.o. female with hypertension, hyperlipidemia, aortic stenosis, OSA on CPAP,  h/o TIA, dementia Assessment & Plan Aortic stenosis: Formal report not available to me, but clinically suspect possible severe aortic stenosis.  Daughter reports that recent echocardiogram showed EF of 40% and likely severe aortic stenosis.  Patient has symptoms of NYHA class II-III exertional dyspnea, as well as mild leg edema and lightheadedness. Recommend switching indapamide  to Lasix  40 mg daily for better diuresis.  Recommend 1.5-2 L of fluid intake daily. I would like to obtain formal echocardiogram report.  However, talking to patient and her daughter as well as her friend, who both are her healthcare power of attorney's, they do not want to pursue any aggressive workup and invasive management.  Given patient's advanced age and dementia, I think this is very reasonable.  Patient has advanced directive that includes DNR wishes.  If echocardiogram corroborates diagnosis  of severe aortic stenosis, I suspect that she would have worsening symptoms and functional decline over the next 1 year.  He is appropriate to pursue hospice care should severe arctic stenosis be confirmed on echocardiogram.  If not, could still consider palliative care approach.  Bradycardia: Patient also has longstanding bradycardia, which could also be contributing to lightheadedness symptoms.  On today's EKG, it appears to be ectopic atrial rhythm >40 bpm.   Even if this were to be symptomatic bradycardia, patient and daughter are clear that they would not like to pursue further workup and/or pacemaker placement. For a while reason, again, hospice of palliative care approach is appropriate.   Whether we pursue hospice or palliative care approach, patient would like to continue staying at her friend's home.  She is familiar with the surroundings, and change in her surroundings may not bode well for her dementia.  Therefore, I would recommend home hospice or palliative care.   Meds ordered this encounter  Medications   furosemide  (LASIX ) 40 MG tablet    Sig: Take 1 tablet (40 mg total) by mouth  daily.     F/u as needed  Signed, Newman JINNY Lawrence, MD    Addendum: After today's visit, I received external echocardiogram results, as detailed below.  External echocardiogram report 06/19/2024: Normal-sized LV.  Moderate LVH.  LVEF 60 to 65%. Grade 1 diastolic dysfunction. Normal RV size and function.  Trileaflet aortic valve with moderate leaflet calcification. Reported moderate aortic stenosis, AVA 0.8 mm, mean pressure gradient 29 mmHg.  Mild to moderate aortic regurgitation. Mild to moderate mitral regurgitation.  Mild tricuspid regurgitation.  No evidence of pulmonary hypertension.  This echocardiogram does not confirm severe aortic stenosis, it could still be paradoxically low-flow low gradient severe aortic stenosis.  Regardless of the final diagnosis, I still think conservative  approach is more appropriate given patient's advanced age and dementia.  In absence of clear severe aortic stenosis, hospice care may not be necessary, would still recommend palliative care approach.  I would defer further steps to patient's providers at friend's home.  I be happy to add additional referral if deemed necessary.  I called patient's daughter Gwynneth Gull years on 07/07/2024 around 7: 30 PM, and left a voicemail to call back for further discussion regarding the same.  Newman JINNY Lawrence, MD  "

## 2024-07-06 NOTE — Patient Instructions (Addendum)
 Hospice order pending echocardiogram review  STOP Indapamide   START Lasix  40 mg daily   Follow-Up: At [facility], you and your health needs are our priority.  As part of our continuing mission to provide you with exceptional heart care, our providers are all part of one team.  This team includes your primary Cardiologist (physician) and Advanced Practice Providers or APPs (Physician Assistants and Nurse Practitioners) who all work together to provide you with the care you need, when you need it.  Your next appointment:   As needed   Provider:   Newman JINNY Lawrence, MD

## 2024-07-07 ENCOUNTER — Encounter: Payer: Self-pay | Admitting: Cardiology

## 2024-07-07 DIAGNOSIS — Z7189 Other specified counseling: Secondary | ICD-10-CM | POA: Insufficient documentation

## 2024-07-10 ENCOUNTER — Telehealth: Payer: Self-pay

## 2024-07-10 NOTE — Telephone Encounter (Signed)
 ManX please consult with the facility staff to resolve concern

## 2024-07-10 NOTE — Telephone Encounter (Signed)
 Assisted living resident. All calls and concerns to be addressed via the facility staff (CNA's, Nurses, and providers)

## 2024-07-10 NOTE — Telephone Encounter (Signed)
 Copied from CRM [id number]. Topic: Referral - Request for Referral >> Jul 10, 2024 11:57 AM Mercer PEDLAR wrote: Did the patient discuss referral with their provider in the last year? No (If No - schedule appointment) (If Yes - send message)  Appointment offered? No  Type of order/referral and detailed reason for visit: Autumn Allen - Nurse at [facility], calling to request a Pulmonary referral to be sent by PCP at request of Autumn Lorenda Hark, Autumn Allen.   Preference of office, provider, location: No preference.   If referral order, have you been seen by this specialty before? No (If Yes, this issue or another issue? When? Where?  Can we respond through MyChart? No

## 2024-07-11 ENCOUNTER — Encounter: Payer: Self-pay | Admitting: Cardiology

## 2024-07-11 ENCOUNTER — Other Ambulatory Visit: Payer: Self-pay | Admitting: Nurse Practitioner

## 2024-07-11 DIAGNOSIS — J9600 Acute respiratory failure, unspecified whether with hypoxia or hypercapnia: Secondary | ICD-10-CM

## 2024-07-16 NOTE — Telephone Encounter (Signed)
 If daughter will like to. I am not sure it would be much different from what they are able to do at her current facility.  Thanks MJP

## 2024-07-16 NOTE — Telephone Encounter (Signed)
 Do you want me to place a palliative care consult?

## 2024-08-01 ENCOUNTER — Ambulatory Visit: Admitting: Adult Health

## 2024-08-07 ENCOUNTER — Ambulatory Visit: Admitting: Pulmonary Disease

## 2024-11-08 ENCOUNTER — Ambulatory Visit: Admitting: Adult Health
